# Patient Record
Sex: Female | Born: 1935 | Race: Asian | Hispanic: No | Smoking: Never smoker
Health system: Southern US, Community
[De-identification: ages and names within clinical notes are randomized; demographics above are authoritative.]

## PROBLEM LIST (undated history)

## (undated) DIAGNOSIS — E7800 Pure hypercholesterolemia, unspecified: Secondary | ICD-10-CM

## (undated) DIAGNOSIS — I639 Cerebral infarction, unspecified: Secondary | ICD-10-CM

## (undated) DIAGNOSIS — I10 Essential (primary) hypertension: Secondary | ICD-10-CM

## (undated) HISTORY — PX: NO PAST SURGERIES: SHX2092

## (undated) HISTORY — DX: Pure hypercholesterolemia, unspecified: E78.00

## (undated) HISTORY — DX: Essential (primary) hypertension: I10

## (undated) HISTORY — DX: Cerebral infarction, unspecified: I63.9

---

## 1997-12-01 ENCOUNTER — Encounter (INDEPENDENT_AMBULATORY_CARE_PROVIDER_SITE_OTHER): Payer: Self-pay | Admitting: *Deleted

## 1997-12-01 LAB — CONVERTED CEMR LAB

## 1998-09-11 ENCOUNTER — Encounter: Admission: RE | Admit: 1998-09-11 | Discharge: 1998-09-11 | Payer: Self-pay | Admitting: Family Medicine

## 2001-02-19 ENCOUNTER — Encounter: Admission: RE | Admit: 2001-02-19 | Discharge: 2001-02-19 | Payer: Self-pay | Admitting: Family Medicine

## 2002-10-06 ENCOUNTER — Emergency Department (HOSPITAL_COMMUNITY): Admission: EM | Admit: 2002-10-06 | Discharge: 2002-10-06 | Payer: Self-pay | Admitting: Emergency Medicine

## 2004-01-03 ENCOUNTER — Inpatient Hospital Stay (HOSPITAL_COMMUNITY): Admission: EM | Admit: 2004-01-03 | Discharge: 2004-01-07 | Payer: Self-pay | Admitting: Emergency Medicine

## 2004-03-05 ENCOUNTER — Emergency Department (HOSPITAL_COMMUNITY): Admission: EM | Admit: 2004-03-05 | Discharge: 2004-03-05 | Payer: Self-pay | Admitting: Emergency Medicine

## 2005-07-29 ENCOUNTER — Emergency Department (HOSPITAL_COMMUNITY): Admission: EM | Admit: 2005-07-29 | Discharge: 2005-07-29 | Payer: Self-pay | Admitting: *Deleted

## 2007-01-29 ENCOUNTER — Encounter (INDEPENDENT_AMBULATORY_CARE_PROVIDER_SITE_OTHER): Payer: Self-pay | Admitting: *Deleted

## 2012-10-29 ENCOUNTER — Ambulatory Visit (INDEPENDENT_AMBULATORY_CARE_PROVIDER_SITE_OTHER): Payer: Medicaid Other | Admitting: Family Medicine

## 2012-10-29 ENCOUNTER — Ambulatory Visit: Payer: Medicaid Other

## 2012-10-29 VITALS — BP 124/76 | HR 81 | Temp 98.0°F | Resp 18 | Ht <= 58 in | Wt 143.2 lb

## 2012-10-29 DIAGNOSIS — R42 Dizziness and giddiness: Secondary | ICD-10-CM

## 2012-10-29 DIAGNOSIS — R0789 Other chest pain: Secondary | ICD-10-CM

## 2012-10-29 DIAGNOSIS — R0602 Shortness of breath: Secondary | ICD-10-CM

## 2012-10-29 DIAGNOSIS — R059 Cough, unspecified: Secondary | ICD-10-CM

## 2012-10-29 DIAGNOSIS — R05 Cough: Secondary | ICD-10-CM | POA: Diagnosis not present

## 2012-10-29 DIAGNOSIS — R079 Chest pain, unspecified: Secondary | ICD-10-CM

## 2012-10-29 LAB — COMPREHENSIVE METABOLIC PANEL
ALT: 15 U/L (ref 0–35)
BUN: 11 mg/dL (ref 6–23)
CO2: 31 mEq/L (ref 19–32)
Calcium: 9.5 mg/dL (ref 8.4–10.5)
Chloride: 103 mEq/L (ref 96–112)
Creat: 0.91 mg/dL (ref 0.50–1.10)
Glucose, Bld: 91 mg/dL (ref 70–99)

## 2012-10-29 LAB — POCT CBC
HCT, POC: 46.8 % (ref 37.7–47.9)
Hemoglobin: 14.3 g/dL (ref 12.2–16.2)
Lymph, poc: 2.5 (ref 0.6–3.4)
MCH, POC: 25.4 pg — AB (ref 27–31.2)
MCHC: 30.6 g/dL — AB (ref 31.8–35.4)
MCV: 83.3 fL (ref 80–97)
RBC: 5.62 M/uL — AB (ref 4.04–5.48)
WBC: 5.9 10*3/uL (ref 4.6–10.2)

## 2012-10-29 MED ORDER — BENZONATATE 100 MG PO CAPS: ORAL_CAPSULE | ORAL | Status: DC

## 2012-10-29 MED ORDER — AZITHROMYCIN 250 MG PO TABS: ORAL_TABLET | ORAL | Status: DC

## 2012-10-29 NOTE — Progress Notes (Addendum)
Subjective: 78 year old lady who is from [place], has lived in the [place] for a long time, but does not speak [place]. She still is quite active, doing her housework and caring for finances. Her son lives with her. Her daughter came in today and helped interpret. Yesterday the patient was having some substernal heaviness sensation, feeling like she had told getting a good breath. She has not been sick otherwise. She has had these kind of symptoms once in the past and someone gave her a parachute dissolved under the tongue. She is not on regular medications. She is comfortable today. She did have some headache and maybe dizziness.Has had some cough Objective: Elderly lady in no acute distress. Throat clear. TMs normal. Neck supple without significant nodes. Chest clear. Heart regular without murmurs. Abdomen is soft without masses tenderness assessment: Atypical chest pain Shortness of breath Headache and maybe dizziness  Plan: EKG, chest x-ray, and labs  [facility] reading (PRIMARY) by  Dr. Alwyn Ren Normal cxr Borderline heart size .

## 2012-10-29 NOTE — Patient Instructions (Signed)
Take the medications.  If more chest pain or shortness of breath go to the emergency room or call 911  Return if not improving.

## 2013-12-07 ENCOUNTER — Ambulatory Visit (INDEPENDENT_AMBULATORY_CARE_PROVIDER_SITE_OTHER): Payer: Medicare Other | Admitting: Family Medicine

## 2013-12-07 VITALS — BP 140/80 | HR 71 | Temp 98.1°F | Resp 18 | Ht <= 58 in | Wt 142.0 lb

## 2013-12-07 DIAGNOSIS — R51 Headache: Secondary | ICD-10-CM

## 2013-12-07 DIAGNOSIS — R059 Cough, unspecified: Secondary | ICD-10-CM

## 2013-12-07 DIAGNOSIS — R05 Cough: Secondary | ICD-10-CM

## 2013-12-07 MED ORDER — AZITHROMYCIN 250 MG PO TABS: ORAL_TABLET | ORAL | Status: DC

## 2013-12-07 MED ORDER — HYDROCODONE-HOMATROPINE 5-1.5 MG/5ML PO SYRP: 5.0000 mL | ORAL_SOLUTION | Freq: Three times a day (TID) | ORAL | Status: DC | PRN

## 2013-12-07 NOTE — Progress Notes (Signed)
Subjective:  This chart was scribed for Ann Sidle, MD by Carl Best, Medical Scribe. This patient was seen in Room 4 and the patient's care was started at 6:07 PM.   Patient ID: Ann Coleman, female    DOB: [birth year], 80 y.o.   MRN: [id number]  HPI HPI Comments: [name] is a 80 y.o. female who presents to the [facility] complaining of constant headache and dizziness that started last night.  The patient's daughter denies fever as an associated symptom.  She states that the patient lists SOB and cough as associated symptoms.  The patient's daughter states that the patient had asthmatic symptoms two years ago.  She states that the patient has not been bothered by Asthma since her last visit to [facility]   Past Medical History  Diagnosis Date   Hypertension    History reviewed. No pertinent past surgical history. History reviewed. No pertinent family history. History   Social History   Marital Status: Divorced    Spouse Name: N/A    Number of Children: N/A   Years of Education: N/A   Occupational History   Not on file.   Social History Main Topics   Smoking status: Never Smoker    Smokeless tobacco: Not on file   Alcohol Use: No   Drug Use: No   Sexual Activity: No   Other Topics Concern   Not on file   Social History Narrative   No narrative on file   No Known Allergies   Review of Systems  Constitutional: Negative for fever.  Respiratory: Positive for cough and shortness of breath.   Neurological: Positive for dizziness and headaches.  All other systems reviewed and are negative.      Objective:  Physical Exam  Nursing note and vitals reviewed. Constitutional: She is oriented to person, place, and time. She appears well-developed and well-nourished.  HENT:  Head: Normocephalic and atraumatic.  Eyes: EOM are normal. Pupils are equal, round, and reactive to light.  Neck: Normal range of motion and phonation normal.    Cardiovascular: Normal rate, regular rhythm, normal heart sounds and intact distal pulses.   No murmur heard. Pulmonary/Chest: Effort normal and breath sounds normal. No respiratory distress. She has no wheezes. She has no rales.  Abdominal: Soft.  Musculoskeletal: Normal range of motion.  Neurological: She is alert and oriented to person, place, and time. No cranial nerve deficit. She exhibits normal muscle tone. Coordination normal.  Skin: Skin is warm and dry.  Psychiatric: She has a normal mood and affect. Her behavior is normal. Judgment and thought content normal.   Few ronchi bilaterally edentulous   BP 140/80   Pulse 71   Temp(Src) 98.1 F (36.7 C) (Oral)   Resp 18   Ht 4\' 10"  (1.473 m)   Wt 142 lb (64.411 kg)   BMI 29.69 kg/m2   SpO2 94% Assessment & Plan:    Meds ordered this encounter  Medications   azithromycin (ZITHROMAX) 250 MG tablet    Sig: Take 2 initiallly, then one daily for 4 days    Dispense:  6 tablet    Refill:  0   HYDROcodone-homatropine (HYCODAN) 5-1.5 MG/5ML syrup    Sig: Take 5 mLs by mouth every 8 (eight) hours as needed for cough.    Dispense:  120 mL    Refill:  0    1. Cough   2. Headache(784.0)    I personally performed the services described in this  documentation, which was scribed in my presence. The recorded information has been reviewed and is accurate.  Ann SidleKurt Lauenstein, MD

## 2013-12-16 ENCOUNTER — Encounter (HOSPITAL_COMMUNITY): Payer: Self-pay | Admitting: Emergency Medicine

## 2013-12-16 ENCOUNTER — Emergency Department (HOSPITAL_COMMUNITY)
Admission: EM | Admit: 2013-12-16 | Discharge: 2013-12-16 | Disposition: A | Discharge status: Home / Self Care | Payer: Medicare Other | Attending: Emergency Medicine | Admitting: Emergency Medicine

## 2013-12-16 ENCOUNTER — Emergency Department (HOSPITAL_COMMUNITY): Payer: Medicare Other

## 2013-12-16 DIAGNOSIS — IMO0002 Reserved for concepts with insufficient information to code with codable children: Secondary | ICD-10-CM | POA: Insufficient documentation

## 2013-12-16 DIAGNOSIS — J40 Bronchitis, not specified as acute or chronic: Secondary | ICD-10-CM

## 2013-12-16 DIAGNOSIS — R0789 Other chest pain: Secondary | ICD-10-CM | POA: Diagnosis not present

## 2013-12-16 DIAGNOSIS — I10 Essential (primary) hypertension: Secondary | ICD-10-CM | POA: Diagnosis not present

## 2013-12-16 DIAGNOSIS — Z792 Long term (current) use of antibiotics: Secondary | ICD-10-CM | POA: Insufficient documentation

## 2013-12-16 DIAGNOSIS — R0602 Shortness of breath: Secondary | ICD-10-CM | POA: Diagnosis not present

## 2013-12-16 DIAGNOSIS — J209 Acute bronchitis, unspecified: Secondary | ICD-10-CM | POA: Diagnosis not present

## 2013-12-16 LAB — POCT I-STAT, CHEM 8
BUN: 8 mg/dL (ref 6–23)
CREATININE: 0.9 mg/dL (ref 0.50–1.10)
Calcium, Ion: 1.06 mmol/L — ABNORMAL LOW (ref 1.13–1.30)
Chloride: 105 mEq/L (ref 96–112)
GLUCOSE: 96 mg/dL (ref 70–99)
HEMATOCRIT: 41 % (ref 36.0–46.0)
HEMOGLOBIN: 13.9 g/dL (ref 12.0–15.0)
POTASSIUM: 3.8 meq/L (ref 3.7–5.3)
SODIUM: 142 meq/L (ref 137–147)
TCO2: 28 mmol/L (ref 0–100)

## 2013-12-16 LAB — CBC WITH DIFFERENTIAL/PLATELET
BASOS PCT: 1 % (ref 0–1)
Basophils Absolute: 0 10*3/uL (ref 0.0–0.1)
EOS ABS: 0.1 10*3/uL (ref 0.0–0.7)
EOS PCT: 2 % (ref 0–5)
HCT: 38.5 % (ref 36.0–46.0)
Hemoglobin: 12.2 g/dL (ref 12.0–15.0)
Lymphocytes Relative: 52 % — ABNORMAL HIGH (ref 12–46)
Lymphs Abs: 2.8 10*3/uL (ref 0.7–4.0)
MCH: 25.6 pg — AB (ref 26.0–34.0)
MCHC: 31.7 g/dL (ref 30.0–36.0)
MCV: 80.9 fL (ref 78.0–100.0)
MONOS PCT: 8 % (ref 3–12)
Monocytes Absolute: 0.4 10*3/uL (ref 0.1–1.0)
NEUTROS PCT: 38 % — AB (ref 43–77)
Neutro Abs: 2.1 10*3/uL (ref 1.7–7.7)
PLATELETS: 198 10*3/uL (ref 150–400)
RBC: 4.76 MIL/uL (ref 3.87–5.11)
RDW: 13.8 % (ref 11.5–15.5)
WBC: 5.5 10*3/uL (ref 4.0–10.5)

## 2013-12-16 LAB — POCT I-STAT TROPONIN I: Troponin i, poc: 0 ng/mL (ref 0.00–0.08)

## 2013-12-16 MED ORDER — ALBUTEROL SULFATE HFA 108 (90 BASE) MCG/ACT IN AERS
1.0000 | INHALATION_SPRAY | Freq: Four times a day (QID) | RESPIRATORY_TRACT | Status: DC | PRN
  Administered 2013-12-16: 2 via RESPIRATORY_TRACT
  Filled 2013-12-16: qty 6.7

## 2013-12-16 MED ORDER — PREDNISONE 20 MG PO TABS: 40.0000 mg | ORAL_TABLET | Freq: Every day | ORAL | Status: DC

## 2013-12-16 MED ORDER — PREDNISONE 20 MG PO TABS
40.0000 mg | ORAL_TABLET | Freq: Once | ORAL | Status: AC
  Administered 2013-12-16: 40 mg via ORAL
  Filled 2013-12-16: qty 2

## 2013-12-16 MED ORDER — ALBUTEROL SULFATE (2.5 MG/3ML) 0.083% IN NEBU
5.0000 mg | INHALATION_SOLUTION | Freq: Once | RESPIRATORY_TRACT | Status: AC
  Administered 2013-12-16: 5 mg via RESPIRATORY_TRACT
  Filled 2013-12-16: qty 6

## 2013-12-16 NOTE — ED Notes (Signed)
Ann Coleman. [name] unable to draw remaining labs. Phlebotomy verbalized will attempt blood draw.

## 2013-12-16 NOTE — ED Notes (Signed)
Collect i stats via left AC unable to draw enough blood for complete set of labs. Leslee Home. [name] at bedside attempt to draw labs.

## 2013-12-16 NOTE — ED Notes (Addendum)
Pt complains of chest pain, sob and dizziness for over a week. Went to Urgent care a week ago for same, but not feeling better. Pt was prescribed nitro tabs, states is not helping pain

## 2013-12-16 NOTE — ED Notes (Signed)
EDP, Pickering at bedside.

## 2013-12-16 NOTE — ED Provider Notes (Signed)
CSN: [id number]     Arrival date & time 12/16/13  1248 History   First MD Initiated Contact with Patient 12/16/13 1411     Chief Complaint  Patient presents with  . Shortness of Breath  . URI   patient was translated for a family member. (Consider location/radiation/quality/duration/timing/severity/associated sxs/prior Treatment) Patient is a 80 y.o. female presenting with shortness of breath and URI. The history is provided by the patient and a relative.  Shortness of Breath Associated symptoms: chest pain and cough   Associated symptoms: no abdominal pain, no fever, no rash and no vomiting   URI Presenting symptoms: cough   Presenting symptoms: no fatigue and no fever    patient's had some shortness of breath with cough over the last week or 2. Is seen in urgent care given antibiotics. She's a history of pneumonia. Patient also has had some chest pain. He comes on with these episodes. Described as a tightness. In the past she has had a medicine that went in her mouth it is all for the episodes. No coronary artery disease seen on medical history, but there is somewhat of a language barrier. She's had a decreased exercise tolerance due to shortness of breath recently. No relief with the antibiotic she is on. His been seen in urgent care for the same.  Past Medical History  Diagnosis Date  . Hypertension    History reviewed. No pertinent past surgical history. No family history on file. History  Substance Use Topics  . Smoking status: Never Smoker   . Smokeless tobacco: Not on file  . Alcohol Use: No   OB History   Grav Para Term Preterm Abortions TAB SAB Ect Mult Living                 Review of Systems  Constitutional: Positive for chills. Negative for fever and fatigue.  Respiratory: Positive for cough and shortness of breath.   Cardiovascular: Positive for chest pain. Negative for leg swelling.  Gastrointestinal: Negative for nausea, vomiting, abdominal pain and diarrhea.   Genitourinary: Negative for flank pain.  Musculoskeletal: Negative for back pain.  Skin: Negative for rash.    Allergies  Review of patient's allergies indicates no known allergies.  Home Medications   Current Outpatient Rx  Name  Route  Sig  Dispense  Refill  . azithromycin (ZITHROMAX) 250 MG tablet      Take 2 initiallly, then one daily for 4 days   6 tablet   0   . HYDROcodone-homatropine (HYCODAN) 5-1.5 MG/5ML syrup   Oral   Take 5 mLs by mouth every 8 (eight) hours as needed for cough.   120 mL   0   . predniSONE (DELTASONE) 20 MG tablet   Oral   Take 2 tablets (40 mg total) by mouth daily.   12 tablet   0    BP 168/71  Pulse 80  Temp(Src) 98.3 F (36.8 C) (Oral)  Resp 18  SpO2 93% Physical Exam  Constitutional: She appears well-developed and well-nourished.  HENT:  Head: Normocephalic.  Eyes: Pupils are equal, round, and reactive to light.  Neck: Neck supple.  Cardiovascular: Normal rate and regular rhythm.   Pulmonary/Chest: Effort normal.  Mildly harsh breath sounds without frank wheezes  Abdominal: There is no tenderness.  Musculoskeletal: She exhibits no edema.  Neurological: She is alert.  Skin: Skin is warm.    ED Course  Procedures (including critical care time) Labs Review Labs Reviewed  CBC WITH DIFFERENTIAL -  Abnormal; Notable for the following:    MCH 25.6 (*)    Neutrophils Relative % 38 (*)    Lymphocytes Relative 52 (*)    All other components within normal limits  POCT I-STAT, CHEM 8 - Abnormal; Notable for the following:    Calcium, Ion 1.06 (*)    All other components within normal limits  POCT I-STAT TROPONIN I   Imaging Review No results found.  EKG Interpretation    Date/Time:  Friday December 16 2013 12:58:24 EST Ventricular Rate:  75 PR Interval:  166 QRS Duration: 62 QT Interval:  498 QTC Calculation: 556 R Axis:   52 Text Interpretation:  Sinus rhythm Borderline repolarization abnormality Prolonged QT  interval Baseline wander in lead(s) II III aVF V6 No significant change since last tracing Confirmed by Gresia Isidoro  MD, Allyna Pittsley ([id number]) on 12/16/2013 2:21:54 PM            MDM   1. Bronchitis    Patient with cough. Some shortness of breath. Workup reassuring. Not hypoxic. Will discharge    Juliet Rudeathan R. Rubin PayorPickering, MD 12/19/13 [phone]

## 2013-12-16 NOTE — ED Provider Notes (Signed)
Pt signed out to me.  Pt denies SOB currently, has episodes of dizziness.  No focal deficits, no arm drift.  No facial droop.  Inspiratory and exp wheezing more on left chest fields.  CXR is ok.  On RA, sats range from 91-97%.  With ambulation, maintained sats at 97%.  Will give an inhaler and Rx for steroids for home and encouraged close follow up.  Pt denies CP.  Troponin is normal.  Daughter will help ensure close follow up.    Gavin PoundMichael Y. Bryer Gottsch, MD 12/16/13 1728

## 2013-12-16 NOTE — ED Notes (Signed)
Respiratory at bedside providing inhaler use education.

## 2013-12-16 NOTE — ED Notes (Signed)
Per EDP, Ghim request respiratory paged to provide inhaler education.

## 2013-12-16 NOTE — Progress Notes (Signed)
   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

## 2013-12-16 NOTE — Discharge Instructions (Signed)

## 2013-12-16 NOTE — ED Notes (Signed)
Pt has hx of asthma with nonproductive cough. Pt denies pain at present time.

## 2013-12-16 NOTE — ED Notes (Signed)
Per family, pt was seen at urgent care on Tues cough med-family states cold symptoms-states SOB worse since then

## 2013-12-16 NOTE — ED Notes (Signed)
Pt. Walked from one end of the hall to the other end of the hall and pt. Oxygen level was 97%.

## 2017-03-04 ENCOUNTER — Encounter (HOSPITAL_COMMUNITY): Payer: Self-pay | Admitting: Emergency Medicine

## 2017-03-04 ENCOUNTER — Emergency Department (HOSPITAL_COMMUNITY): Payer: Medicare Other

## 2017-03-04 ENCOUNTER — Inpatient Hospital Stay (HOSPITAL_COMMUNITY)
Admission: EM | Admit: 2017-03-04 | Discharge: 2017-03-06 | DRG: 064 | Disposition: A | Discharge status: Home Health | Payer: Medicare Other | Attending: Internal Medicine | Admitting: Internal Medicine

## 2017-03-04 DIAGNOSIS — S0990XA Unspecified injury of head, initial encounter: Secondary | ICD-10-CM | POA: Diagnosis not present

## 2017-03-04 DIAGNOSIS — G8314 Monoplegia of lower limb affecting left nondominant side: Secondary | ICD-10-CM | POA: Diagnosis present

## 2017-03-04 DIAGNOSIS — I63511 Cerebral infarction due to unspecified occlusion or stenosis of right middle cerebral artery: Principal | ICD-10-CM | POA: Diagnosis present

## 2017-03-04 DIAGNOSIS — Z7982 Long term (current) use of aspirin: Secondary | ICD-10-CM | POA: Diagnosis not present

## 2017-03-04 DIAGNOSIS — R296 Repeated falls: Secondary | ICD-10-CM | POA: Diagnosis not present

## 2017-03-04 DIAGNOSIS — I639 Cerebral infarction, unspecified: Secondary | ICD-10-CM | POA: Diagnosis not present

## 2017-03-04 DIAGNOSIS — R531 Weakness: Secondary | ICD-10-CM | POA: Diagnosis not present

## 2017-03-04 DIAGNOSIS — I160 Hypertensive urgency: Secondary | ICD-10-CM | POA: Diagnosis present

## 2017-03-04 DIAGNOSIS — I10 Essential (primary) hypertension: Secondary | ICD-10-CM | POA: Diagnosis present

## 2017-03-04 DIAGNOSIS — S199XXA Unspecified injury of neck, initial encounter: Secondary | ICD-10-CM | POA: Diagnosis not present

## 2017-03-04 DIAGNOSIS — S8992XA Unspecified injury of left lower leg, initial encounter: Secondary | ICD-10-CM | POA: Diagnosis not present

## 2017-03-04 DIAGNOSIS — I739 Peripheral vascular disease, unspecified: Secondary | ICD-10-CM | POA: Diagnosis present

## 2017-03-04 DIAGNOSIS — G936 Cerebral edema: Secondary | ICD-10-CM | POA: Diagnosis present

## 2017-03-04 DIAGNOSIS — S299XXA Unspecified injury of thorax, initial encounter: Secondary | ICD-10-CM | POA: Diagnosis not present

## 2017-03-04 DIAGNOSIS — S8991XA Unspecified injury of right lower leg, initial encounter: Secondary | ICD-10-CM | POA: Diagnosis not present

## 2017-03-04 DIAGNOSIS — E785 Hyperlipidemia, unspecified: Secondary | ICD-10-CM | POA: Diagnosis present

## 2017-03-04 LAB — URINALYSIS, ROUTINE W REFLEX MICROSCOPIC
Bilirubin Urine: NEGATIVE
GLUCOSE, UA: NEGATIVE mg/dL
Hgb urine dipstick: NEGATIVE
KETONES UR: NEGATIVE mg/dL
LEUKOCYTES UA: NEGATIVE
NITRITE: NEGATIVE
PROTEIN: NEGATIVE mg/dL
Specific Gravity, Urine: 1.005 (ref 1.005–1.030)
pH: 7 (ref 5.0–8.0)

## 2017-03-04 LAB — COMPREHENSIVE METABOLIC PANEL
ALBUMIN: 3.5 g/dL (ref 3.5–5.0)
ALK PHOS: 79 U/L (ref 38–126)
ALT: 13 U/L — ABNORMAL LOW (ref 14–54)
ANION GAP: 7 (ref 5–15)
AST: 19 U/L (ref 15–41)
BILIRUBIN TOTAL: 0.8 mg/dL (ref 0.3–1.2)
BUN: 9 mg/dL (ref 6–20)
CALCIUM: 8.7 mg/dL — AB (ref 8.9–10.3)
CO2: 31 mmol/L (ref 22–32)
Chloride: 103 mmol/L (ref 101–111)
Creatinine, Ser: 0.86 mg/dL (ref 0.44–1.00)
GFR calc Af Amer: >60 mL/min (ref >60)
GFR, EST NON AFRICAN AMERICAN: 57 mL/min — AB (ref >60)
GLUCOSE: 93 mg/dL (ref 65–99)
Potassium: 3.2 mmol/L — ABNORMAL LOW (ref 3.5–5.1)
Sodium: 141 mmol/L (ref 135–145)
TOTAL PROTEIN: 7.7 g/dL (ref 6.5–8.1)

## 2017-03-04 LAB — CBC WITH DIFFERENTIAL/PLATELET
BASOS ABS: 0 10*3/uL (ref 0.0–0.1)
BASOS PCT: 1 %
EOS PCT: 2 %
Eosinophils Absolute: 0.1 10*3/uL (ref 0.0–0.7)
HCT: 39.4 % (ref 36.0–46.0)
Hemoglobin: 12.4 g/dL (ref 12.0–15.0)
Lymphocytes Relative: 41 %
Lymphs Abs: 2.4 10*3/uL (ref 0.7–4.0)
MCH: 25.4 pg — ABNORMAL LOW (ref 26.0–34.0)
MCHC: 31.5 g/dL (ref 30.0–36.0)
MCV: 80.7 fL (ref 78.0–100.0)
MONO ABS: 0.5 10*3/uL (ref 0.1–1.0)
Monocytes Relative: 9 %
Neutro Abs: 2.8 10*3/uL (ref 1.7–7.7)
Neutrophils Relative %: 47 %
PLATELETS: 240 10*3/uL (ref 150–400)
RBC: 4.88 MIL/uL (ref 3.87–5.11)
RDW: 13.7 % (ref 11.5–15.5)
WBC: 5.9 10*3/uL (ref 4.0–10.5)

## 2017-03-04 LAB — I-STAT TROPONIN, ED: TROPONIN I, POC: 0 ng/mL (ref 0.00–0.08)

## 2017-03-04 MED ORDER — ACETAMINOPHEN 160 MG/5ML PO SOLN: 650.0000 mg | ORAL | Status: DC | PRN

## 2017-03-04 MED ORDER — STROKE: EARLY STAGES OF RECOVERY BOOK
Freq: Once | Status: AC
  Administered 2017-03-05
  Filled 2017-03-04: qty 1

## 2017-03-04 MED ORDER — SENNOSIDES-DOCUSATE SODIUM 8.6-50 MG PO TABS: 1.0000 | ORAL_TABLET | Freq: Every evening | ORAL | Status: DC | PRN

## 2017-03-04 MED ORDER — ACETAMINOPHEN 325 MG PO TABS: 650.0000 mg | ORAL_TABLET | ORAL | Status: DC | PRN

## 2017-03-04 MED ORDER — SODIUM CHLORIDE 0.9 % IV SOLN
INTRAVENOUS | Status: AC
  Administered 2017-03-05 (×2): via INTRAVENOUS

## 2017-03-04 MED ORDER — ASPIRIN 300 MG RE SUPP: 300.0000 mg | Freq: Every day | RECTAL | Status: DC

## 2017-03-04 MED ORDER — SODIUM CHLORIDE 0.9 % IV BOLUS (SEPSIS)
500.0000 mL | Freq: Once | INTRAVENOUS | Status: AC
  Administered 2017-03-04: 500 mL via INTRAVENOUS

## 2017-03-04 MED ORDER — HYDRALAZINE HCL 20 MG/ML IJ SOLN: 10.0000 mg | INTRAMUSCULAR | Status: DC | PRN

## 2017-03-04 MED ORDER — ASPIRIN 325 MG PO TABS
325.0000 mg | ORAL_TABLET | Freq: Every day | ORAL | Status: DC
  Administered 2017-03-05 – 2017-03-06 (×2): 325 mg via ORAL
  Filled 2017-03-04 (×2): qty 1

## 2017-03-04 MED ORDER — ACETAMINOPHEN 650 MG RE SUPP: 650.0000 mg | RECTAL | Status: DC | PRN

## 2017-03-04 NOTE — ED Notes (Signed)
Patient transported to MRI 

## 2017-03-04 NOTE — Consult Note (Signed)
Referring Physician: Dr. Oleta Mouse    Chief Complaint: Left leg pain  HPI: Ann Coleman is an 83 y.o. female from [place] who presented to the [facility] ED with c/c of left leg pain for a couple of days. She fell in the bathroom on Wednesday evening due to left leg pain. She did not lose consciousness or strike her head. She speaks [place] and relative provides interpretation during exam and interview. Denies headache, chest pain or abdominal pain. No vision change, confusion or aphasia. Endorses SOB. Only neurological complaint is left leg weakness. No sensory loss. No history of stroke or MI. She does not take ASA at home. MRI at [facility] ED revealed a subacute ischemic infarction involving the posterior limb of the right internal capsule.   Past Medical History:  Diagnosis Date  . Hypertension     History reviewed. No pertinent surgical history.  No family history on file. Social History:  reports that she has never smoked. She has never used smokeless tobacco. She reports that she does not drink alcohol or use drugs.  Allergies: No Known Allergies  Home Medications:  acetaminophen (TYLENOL) 500 MG tablet Take 500 mg by mouth every 6 (six) hours as needed for moderate pain. Rise Patience, MD Not Ordered  azithromycin (ZITHROMAX) 250 MG tablet Take 2 initiallly, then one daily for 4 days Rise Patience, MD Not Ordered   Patient not taking: Reported on 03/04/2017    HYDROcodone-homatropine (HYCODAN) 5-1.5 MG/5ML syrup Take 5 mLs by mouth every 8 (eight) hours as needed for cough. Rise Patience, MD Not Ordered   Patient not taking: Reported on 03/04/2017    predniSONE (DELTASONE) 20 MG tablet Take 2 tablets (40 mg total) by mouth daily. Rise Patience, MD Not Ordered   Patient not taking: Reported on 03/04/2017     ROS: As per HPI.   Physical Examination: Blood pressure (!) 222/101, pulse 78, temperature 98.6 F (37 C), resp. rate 18, height _0  (1.499 m), weight 64.4 kg (142 lb), SpO2  92 %.  HEENT: [place]/AT Ext: No edema.  Skin: No rash to face or extremities.   Neurologic Examination: Mental Status: Alert and oriented to date, month, city and hospital, but not year. Speech fluent with intact comprehension of commands related to her by relative in [place]. Pleasant and cooperative.  Cranial Nerves: II:  Visual fields grossly normal to bedside confrontation, PERRL III,IV, VI: ptosis not present, EOMI without nystagmus V,VII: smile symmetric, facial temp sensation normal bilaterally VIII: hearing intact to questions and commands IX,X: No hoarseness or hypophonia XI: No asymmetry XII: midline tongue extension  Motor: RUE: 5/5 RLE: 5/5 LUE: 5/5 LLE: 5/5 proximally with subtle 4+/5 weakness of knee extension. Hesitancy with left foot dorsiflexion.  Normal tone throughout; no atrophy noted Sensory: Temp and light touch intact throughout. No extinction noted.  Deep Tendon Reflexes:  2+ bilateral brachioradialis, biceps and patellae. 2+ left achilles, 1+ right achilles. Toes downgoing bilaterally.   Cerebellar: No ataxia with FNF bilaterally.  Gait: Able to stand with own power. Ambulation slow without asymmetry.    Results for orders placed or performed during the hospital encounter of 03/04/17 (from the past 48 hour(s))  Urinalysis, Routine w reflex microscopic     Status: Abnormal   Collection Time: 03/04/17 12:30 PM  Result Value Ref Range   Color, Urine STRAW (A) YELLOW   APPearance CLEAR CLEAR   Specific Gravity, Urine 1.005 1.005 - 1.030   pH 7.0 5.0 - 8.0  Glucose, UA NEGATIVE NEGATIVE mg/dL   Hgb urine dipstick NEGATIVE NEGATIVE   Bilirubin Urine NEGATIVE NEGATIVE   Ketones, ur NEGATIVE NEGATIVE mg/dL   Protein, ur NEGATIVE NEGATIVE mg/dL   Nitrite NEGATIVE NEGATIVE   Leukocytes, UA NEGATIVE NEGATIVE  Comprehensive metabolic panel     Status: Abnormal   Collection Time: 03/04/17 12:52 PM  Result Value Ref Range   Sodium 141 135 - 145 mmol/L    Potassium 3.2 (L) 3.5 - 5.1 mmol/L   Chloride 103 101 - 111 mmol/L   CO2 31 22 - 32 mmol/L   Glucose, Bld 93 65 - 99 mg/dL   BUN 9 6 - 20 mg/dL   Creatinine, Ser 0.86 0.44 - 1.00 mg/dL   Calcium 8.7 (L) 8.9 - 10.3 mg/dL   Total Protein 7.7 6.5 - 8.1 g/dL   Albumin 3.5 3.5 - 5.0 g/dL   AST 19 15 - 41 U/L   ALT 13 (L) 14 - 54 U/L   Alkaline Phosphatase 79 38 - 126 U/L   Total Bilirubin 0.8 0.3 - 1.2 mg/dL   GFR calc non Af Amer 57 (L) >60 mL/min   GFR calc Af Amer >60 >60 mL/min    Comment: (NOTE) The eGFR has been calculated using the CKD EPI equation. This calculation has not been validated in all clinical situations. eGFR's persistently <60 mL/min signify possible Chronic Kidney Disease.    Anion gap 7 5 - 15  CBC with Differential     Status: Abnormal   Collection Time: 03/04/17 12:52 PM  Result Value Ref Range   WBC 5.9 4.0 - 10.5 K/uL   RBC 4.88 3.87 - 5.11 MIL/uL   Hemoglobin 12.4 12.0 - 15.0 g/dL   HCT 39.4 36.0 - 46.0 %   MCV 80.7 78.0 - 100.0 fL   MCH 25.4 (L) 26.0 - 34.0 pg   MCHC 31.5 30.0 - 36.0 g/dL   RDW 13.7 11.5 - 15.5 %   Platelets 240 150 - 400 K/uL   Neutrophils Relative % 47 %   Neutro Abs 2.8 1.7 - 7.7 K/uL   Lymphocytes Relative 41 %   Lymphs Abs 2.4 0.7 - 4.0 K/uL   Monocytes Relative 9 %   Monocytes Absolute 0.5 0.1 - 1.0 K/uL   Eosinophils Relative 2 %   Eosinophils Absolute 0.1 0.0 - 0.7 K/uL   Basophils Relative 1 %   Basophils Absolute 0.0 0.0 - 0.1 K/uL  I-stat troponin, ED     Status: None   Collection Time: 03/04/17  1:01 PM  Result Value Ref Range   Troponin i, poc 0.00 0.00 - 0.08 ng/mL   Comment 3            Comment: Due to the release kinetics of cTnI, a negative result within the first hours of the onset of symptoms does not rule out myocardial infarction with certainty. If myocardial infarction is still suspected, repeat the test at appropriate intervals.    Dg Chest 2 View  Result Date: 03/04/2017 CLINICAL DATA:   Palpitations.  Weakness and falls EXAM: CHEST  2 VIEW COMPARISON:  12/16/2013 FINDINGS: Chronic cardiomegaly and aortic tortuosity. Chronic widening of the upper mediastinum, usually from ectatic great vessels or thyroid enlargement, stable. There is no edema, consolidation, effusion, or pneumothorax. No acute osseous finding. IMPRESSION: 1. No acute finding when compared to prior. 2. Chronic cardiomegaly. Electronically Signed   By: Monte Fantasia M.D.   On: 03/04/2017 12:53   Ct Head Wo  Contrast  Result Date: 03/04/2017 CLINICAL DATA:  Patient fell in the bathroom. EXAM: CT HEAD WITHOUT CONTRAST CT CERVICAL SPINE WITHOUT CONTRAST TECHNIQUE: Multidetector CT imaging of the head and cervical spine was performed following the standard protocol without intravenous contrast. Multiplanar CT image reconstructions of the cervical spine were also generated. COMPARISON:  None. FINDINGS: CT HEAD FINDINGS Brain: There is no evidence for acute hemorrhage, hydrocephalus, mass lesion, or abnormal extra-axial fluid collection. No definite CT evidence for acute infarction. Diffuse loss of parenchymal volume is consistent with atrophy. Patchy low attenuation in the deep hemispheric and periventricular white matter is nonspecific, but likely reflects chronic microvascular ischemic demyelination. Lacunar infarct noted left basal ganglia. Vascular: No hyperdense vessel or unexpected calcification. Skull: No evidence for fracture. No worrisome lytic or sclerotic lesion. Sinuses/Orbits: The visualized paranasal sinuses and mastoid air cells are clear. Visualized portions of the globes and intraorbital fat are unremarkable. Other: None. CT CERVICAL SPINE FINDINGS Alignment: No subluxation. Mild straightening of normal cervical lordosis is evident. Facets are well aligned bilaterally. Skull base and vertebrae: No fracture. Soft tissues and spinal canal: 2.7 cm heterogeneous right thyroid nodule. 1.9 cm rim calcified left thyroid  nodule. No prevertebral soft tissue edema. Disc levels:  Relatively preserved throughout. Upper chest:  Biapical pleural-parenchymal scarring. Other: None. IMPRESSION: 1. No acute intracranial abnormality. 2. Atrophy with chronic small vessel white matter ischemic disease. 3. No cervical spine fracture. 4. Loss of cervical lordosis. This can be related to patient positioning, muscle spasm or soft tissue injury. 5. Bilateral thyroid nodules measuring up to 2.7 cm diameter. Thyroid ultrasound recommended to further evaluate. Electronically Signed   By: Misty Stanley M.D.   On: 03/04/2017 13:43   Ct Cervical Spine Wo Contrast  Result Date: 03/04/2017 CLINICAL DATA:  Patient fell in the bathroom. EXAM: CT HEAD WITHOUT CONTRAST CT CERVICAL SPINE WITHOUT CONTRAST TECHNIQUE: Multidetector CT imaging of the head and cervical spine was performed following the standard protocol without intravenous contrast. Multiplanar CT image reconstructions of the cervical spine were also generated. COMPARISON:  None. FINDINGS: CT HEAD FINDINGS Brain: There is no evidence for acute hemorrhage, hydrocephalus, mass lesion, or abnormal extra-axial fluid collection. No definite CT evidence for acute infarction. Diffuse loss of parenchymal volume is consistent with atrophy. Patchy low attenuation in the deep hemispheric and periventricular white matter is nonspecific, but likely reflects chronic microvascular ischemic demyelination. Lacunar infarct noted left basal ganglia. Vascular: No hyperdense vessel or unexpected calcification. Skull: No evidence for fracture. No worrisome lytic or sclerotic lesion. Sinuses/Orbits: The visualized paranasal sinuses and mastoid air cells are clear. Visualized portions of the globes and intraorbital fat are unremarkable. Other: None. CT CERVICAL SPINE FINDINGS Alignment: No subluxation. Mild straightening of normal cervical lordosis is evident. Facets are well aligned bilaterally. Skull base and vertebrae:  No fracture. Soft tissues and spinal canal: 2.7 cm heterogeneous right thyroid nodule. 1.9 cm rim calcified left thyroid nodule. No prevertebral soft tissue edema. Disc levels:  Relatively preserved throughout. Upper chest:  Biapical pleural-parenchymal scarring. Other: None. IMPRESSION: 1. No acute intracranial abnormality. 2. Atrophy with chronic small vessel white matter ischemic disease. 3. No cervical spine fracture. 4. Loss of cervical lordosis. This can be related to patient positioning, muscle spasm or soft tissue injury. 5. Bilateral thyroid nodules measuring up to 2.7 cm diameter. Thyroid ultrasound recommended to further evaluate. Electronically Signed   By: Misty Stanley M.D.   On: 03/04/2017 13:43   Mr Brain Wo Contrast  Result Date: 03/04/2017  CLINICAL DATA:  Left leg weakness.  Multiple recent falls. EXAM: MRI HEAD WITHOUT CONTRAST TECHNIQUE: Multiplanar, multiecho pulse sequences of the brain and surrounding structures were obtained without intravenous contrast. COMPARISON:  Head CT 03/04/2017 FINDINGS: Despite efforts by the technologist and patient, motion artifact is present on today's examination and could not be eliminated. The findings of this study are interpreted in the context of that limitation. Brain: There is high signal on diffusion-weighted imaging along the course of the posterior limb of the right internal capsule without diffusion restriction on the ADC map. There is an area of hyperintense T2 weighted signal surrounding this lesion extending into the superior aspect of the right thalamus. No acute intraparenchymal hematoma. There are multiple old basal ganglia lacunar infarcts. There is beginning confluent hyperintense T2-weighted signal within the periventricular white matter, most often seen in the setting of chronic microvascular ischemia. Vascular: Major intracranial arterial and venous sinus flow voids are preserved. No evidence of chronic microhemorrhage or amyloid  angiopathy. Skull and upper cervical spine: The visualized skull base, calvarium, upper cervical spine and extracranial soft tissues are normal. Sinuses/Orbits: No fluid levels or advanced mucosal thickening. No mastoid effusion. Normal orbits. IMPRESSION: 1. Likely subacute ischemia involving the posterior limb of the right internal capsule with edema extending into the superior aspect of the right thalamus. This is in keeping with reported left-sided weakness. 2. No hemorrhage or significant mass effect. 3. Multiple old lacunar infarcts of the basal ganglia and findings of chronic microvascular ischemia. Electronically Signed   By: Ulyses Jarred M.D.   On: 03/04/2017 19:19   Dg Knee Complete 4 Views Left  Result Date: 03/04/2017 CLINICAL DATA:  Chronic left knee pain, aggravated by a fall this morning. EXAM: LEFT KNEE - COMPLETE 4+ VIEW COMPARISON:  None. FINDINGS: The patient has severe tricompartmental osteoarthritis. There is no fracture or dislocation or appreciable joint effusion. Loose bodies in the joint. IMPRESSION: No acute abnormalities.  Severe tricompartmental osteoarthritis. Electronically Signed   By: Lorriane Shire M.D.   On: 03/04/2017 11:54   Dg Knee Complete 4 Views Right  Result Date: 03/04/2017 CLINICAL DATA:  Left knee pain and weakness, fall, bilateral knee pain EXAM: RIGHT KNEE - COMPLETE 4+ VIEW COMPARISON:  None. FINDINGS: Four views of the right knee submitted. There is diffuse narrowing of joint space. Mild spurring medial and lateral tibial plateau. There is spurring of lateral femoral condyle. Significant narrowing of patellofemoral joint space. Spurring of patella. Small joint effusion. Diffuse osteopenia. No acute fracture or subluxation. IMPRESSION: No acute fracture or subluxation. Osteoarthritic changes as described above. Diffuse osteopenia. Small joint effusion. Electronically Signed   By: Lahoma Crocker M.D.   On: 03/04/2017 11:54    Assessment: 83 y.o. female with 2 day  history of left leg pain followed by a fall attributed to pain. 1. MRI revealed subacute infarction involving the posterior limb of the right internal capsule with edema extending into the superior aspect of the right thalamus.  2. Also seen on MRI are multiple old lacunar infarcts of the basal ganglia as well as chronic microvascular ischemic changes. 3. MRA reveals patent circle of Willis with no large vessel occlusion. There is diminished signal within the left internal carotid artery from distal cavernous to terminal segment indicating probable underlying moderate to severe stenosis. Also noted is asymmetric reduced signal in left MCA distribution likely due to upstream ICA stenosis.  4. Neurological examination reveals subtle LLE weakness distally, otherwise nonfocal.  5. EKG shows no  atrial fibrillation.  6. Stroke Risk Factors - HTN.   Plan: 1. HgbA1c, fasting lipid panel 2. Carotid ultrasound 3. TTE.  4. PT consult, OT consult, Speech consult 5. Started on ASA 6. BP management. Out of permissive HTN time window.  7. Telemetry monitoring 8. Frequent neuro checks 9. Despite intracranial atherosclerotic findings on MRA, given advanced age, risks of statin therapy most likely outweigh potential benefits  _0  signed: Dr. Kerney Elbe 03/04/2017, 9:18 PM

## 2017-03-04 NOTE — ED Provider Notes (Signed)
[facility]-EMERGENCY DEPT Provider Note   CSN: [id number] Arrival date & time: 03/04/17  1045     History   Chief Complaint Chief Complaint  Patient presents with  . Fall    HPI Ann Coleman is a 83 y.o. female.  HPI\ Patient brought in by daughter for multiple falls, bilateral knee pain, left leg "heaviness."  Pt began falling last week, fell 3-4 times.  Daughter moved her to her house where she fell this morning.  Pt has a walker but fell over anyway.  She did hit the back of her head.  Pt c/o palpitations since the fall this morning.  Daughter notes patient has complained that her left leg is "heavy."  Denies fevers, recent illness, CP, SOB, cough, abdominal pain, N/V/D, urinary symptoms.    History primarily provided by daughter.  Pt does not speak [place] English.  She is from [place], here since 1983.    Pt has no PCP.  Takes no chronic medications.    Past Medical History:  Diagnosis Date  . Hypertension     Patient Active Problem List   Diagnosis Date Noted  . Acute right MCA stroke (HCC) 03/04/2017  . Hypertensive urgency 03/04/2017  . Stroke (cerebrum) (HCC) 03/04/2017    History reviewed. No pertinent surgical history.  OB History    No data available       Home Medications    Prior to Admission medications   Medication Sig Start Date End Date Taking? Authorizing Provider  acetaminophen (TYLENOL) 500 MG tablet Take 500 mg by mouth every 6 (six) hours as needed for moderate pain.   Yes Historical Provider, MD  azithromycin (ZITHROMAX) 250 MG tablet Take 2 initiallly, then one daily for 4 days Patient not taking: Reported on 03/04/2017 12/07/13   [name], MD  HYDROcodone-homatropine [facility]) 5-1.5 MG/5ML syrup Take 5 mLs by mouth every 8 (eight) hours as needed for cough. Patient not taking: Reported on 03/04/2017 12/07/13   [name], MD  predniSONE (DELTASONE) 20 MG tablet Take 2 tablets (40 mg total) by mouth daily. Patient not taking: Reported on 03/04/2017  12/16/13   Quita Skye, MD    Family History Family History  Problem Relation Age of Onset  . Stroke Neg Hx   . Diabetes Mellitus II Neg Hx   . Hypertension Neg Hx     Social History Social History  Substance Use Topics  . Smoking status: Never Smoker  . Smokeless tobacco: Never Used  . Alcohol use No     Allergies   Patient has no known allergies.   Review of Systems Review of Systems  All other systems reviewed and are negative.    Physical Exam Updated Vital Signs BP (!) 222/101 (BP Location: Right Arm)   Pulse 78   Temp 98.6 F (37 C)   Resp 18   Ht  (1.499 m)   Wt 64.4 kg   SpO2 92%   BMI 28.68 kg/m   Physical Exam  Constitutional: She appears well-developed and well-nourished. No distress.  HENT:  Head: Normocephalic and atraumatic.  Neck: Neck supple.  Cardiovascular: Normal rate, regular rhythm and intact distal pulses.   Pulmonary/Chest: Effort normal and breath sounds normal. No respiratory distress. She has no wheezes. She has no rales.  Abdominal: Soft. She exhibits no distension. There is no tenderness. There is no rebound and no guarding.  Musculoskeletal:  No focal tenderness on exam.  No edema or skin changes.    Neurological: She is  alert.  Moves all extremities equally.   Lower extremities:  No focal weakness noted, sensation intact, distal pulses intact.   Exam somewhat limited due to translation/pt comprehension? Pt unable to stand without assistance, daughters help pt move legs onto bed when resettling from wheelchair.   Skin: She is not diaphoretic.  Nursing note and vitals reviewed.    ED Treatments / Results  Labs (all labs ordered are listed, but only abnormal results are displayed) Labs Reviewed  COMPREHENSIVE METABOLIC PANEL - Abnormal; Notable for the following:       Result Value   Potassium 3.2 (*)    Calcium 8.7 (*)    ALT 13 (*)    GFR calc non Af Amer 57 (*)    All other components within normal limits    CBC WITH DIFFERENTIAL/PLATELET - Abnormal; Notable for the following:    MCH 25.4 (*)    All other components within normal limits  URINALYSIS, ROUTINE W REFLEX MICROSCOPIC - Abnormal; Notable for the following:    Color, Urine STRAW (*)    All other components within normal limits  URINE CULTURE  I-STAT TROPOININ, ED    EKG  EKG Interpretation  Date/Time:  Wednesday March 04 2017 20:09:54 EDT Ventricular Rate:  74 PR Interval:    QRS Duration: 63 QT Interval:  526 QTC Calculation: 584 R Axis:   76 Text Interpretation:  Sinus rhythm Abnormal R-wave progression, early transition Left ventricular hypertrophy Borderline abnrm T, anterolateral leads Prolonged QT interval Confirmed by Deretha Emory  MD, SCOTT [phone]) on 03/04/2017 8:33:16 PM       Radiology Dg Chest 2 View  Result Date: 03/04/2017 CLINICAL DATA:  Palpitations.  Weakness and falls EXAM: CHEST  2 VIEW COMPARISON:  12/16/2013 FINDINGS: Chronic cardiomegaly and aortic tortuosity. Chronic widening of the upper mediastinum, usually from ectatic great vessels or thyroid enlargement, stable. There is no edema, consolidation, effusion, or pneumothorax. No acute osseous finding. IMPRESSION: 1. No acute finding when compared to prior. 2. Chronic cardiomegaly. Electronically Signed   By: Marnee Spring M.D.   On: 03/04/2017 12:53   Ct Head Wo Contrast  Result Date: 03/04/2017 CLINICAL DATA:  Patient fell in the bathroom. EXAM: CT HEAD WITHOUT CONTRAST CT CERVICAL SPINE WITHOUT CONTRAST TECHNIQUE: Multidetector CT imaging of the head and cervical spine was performed following the standard protocol without intravenous contrast. Multiplanar CT image reconstructions of the cervical spine were also generated. COMPARISON:  None. FINDINGS: CT HEAD FINDINGS Brain: There is no evidence for acute hemorrhage, hydrocephalus, mass lesion, or abnormal extra-axial fluid collection. No definite CT evidence for acute infarction. Diffuse loss of parenchymal  volume is consistent with atrophy. Patchy low attenuation in the deep hemispheric and periventricular white matter is nonspecific, but likely reflects chronic microvascular ischemic demyelination. Lacunar infarct noted left basal ganglia. Vascular: No hyperdense vessel or unexpected calcification. Skull: No evidence for fracture. No worrisome lytic or sclerotic lesion. Sinuses/Orbits: The visualized paranasal sinuses and mastoid air cells are clear. Visualized portions of the globes and intraorbital fat are unremarkable. Other: None. CT CERVICAL SPINE FINDINGS Alignment: No subluxation. Mild straightening of normal cervical lordosis is evident. Facets are well aligned bilaterally. Skull base and vertebrae: No fracture. Soft tissues and spinal canal: 2.7 cm heterogeneous right thyroid nodule. 1.9 cm rim calcified left thyroid nodule. No prevertebral soft tissue edema. Disc levels:  Relatively preserved throughout. Upper chest:  Biapical pleural-parenchymal scarring. Other: None. IMPRESSION: 1. No acute intracranial abnormality. 2. Atrophy with chronic small vessel white  matter ischemic disease. 3. No cervical spine fracture. 4. Loss of cervical lordosis. This can be related to patient positioning, muscle spasm or soft tissue injury. 5. Bilateral thyroid nodules measuring up to 2.7 cm diameter. Thyroid ultrasound recommended to further evaluate. Electronically Signed   By: Kennith Center M.D.   On: 03/04/2017 13:43   Ct Cervical Spine Wo Contrast  Result Date: 03/04/2017 CLINICAL DATA:  Patient fell in the bathroom. EXAM: CT HEAD WITHOUT CONTRAST CT CERVICAL SPINE WITHOUT CONTRAST TECHNIQUE: Multidetector CT imaging of the head and cervical spine was performed following the standard protocol without intravenous contrast. Multiplanar CT image reconstructions of the cervical spine were also generated. COMPARISON:  None. FINDINGS: CT HEAD FINDINGS Brain: There is no evidence for acute hemorrhage, hydrocephalus, mass  lesion, or abnormal extra-axial fluid collection. No definite CT evidence for acute infarction. Diffuse loss of parenchymal volume is consistent with atrophy. Patchy low attenuation in the deep hemispheric and periventricular white matter is nonspecific, but likely reflects chronic microvascular ischemic demyelination. Lacunar infarct noted left basal ganglia. Vascular: No hyperdense vessel or unexpected calcification. Skull: No evidence for fracture. No worrisome lytic or sclerotic lesion. Sinuses/Orbits: The visualized paranasal sinuses and mastoid air cells are clear. Visualized portions of the globes and intraorbital fat are unremarkable. Other: None. CT CERVICAL SPINE FINDINGS Alignment: No subluxation. Mild straightening of normal cervical lordosis is evident. Facets are well aligned bilaterally. Skull base and vertebrae: No fracture. Soft tissues and spinal canal: 2.7 cm heterogeneous right thyroid nodule. 1.9 cm rim calcified left thyroid nodule. No prevertebral soft tissue edema. Disc levels:  Relatively preserved throughout. Upper chest:  Biapical pleural-parenchymal scarring. Other: None. IMPRESSION: 1. No acute intracranial abnormality. 2. Atrophy with chronic small vessel white matter ischemic disease. 3. No cervical spine fracture. 4. Loss of cervical lordosis. This can be related to patient positioning, muscle spasm or soft tissue injury. 5. Bilateral thyroid nodules measuring up to 2.7 cm diameter. Thyroid ultrasound recommended to further evaluate. Electronically Signed   By: Kennith Center M.D.   On: 03/04/2017 13:43   Mr Brain Wo Contrast  Result Date: 03/04/2017 CLINICAL DATA:  Left leg weakness.  Multiple recent falls. EXAM: MRI HEAD WITHOUT CONTRAST TECHNIQUE: Multiplanar, multiecho pulse sequences of the brain and surrounding structures were obtained without intravenous contrast. COMPARISON:  Head CT 03/04/2017 FINDINGS: Despite efforts by the technologist and patient, motion artifact is  present on today's examination and could not be eliminated. The findings of this study are interpreted in the context of that limitation. Brain: There is high signal on diffusion-weighted imaging along the course of the posterior limb of the right internal capsule without diffusion restriction on the ADC map. There is an area of hyperintense T2 weighted signal surrounding this lesion extending into the superior aspect of the right thalamus. No acute intraparenchymal hematoma. There are multiple old basal ganglia lacunar infarcts. There is beginning confluent hyperintense T2-weighted signal within the periventricular white matter, most often seen in the setting of chronic microvascular ischemia. Vascular: Major intracranial arterial and venous sinus flow voids are preserved. No evidence of chronic microhemorrhage or amyloid angiopathy. Skull and upper cervical spine: The visualized skull base, calvarium, upper cervical spine and extracranial soft tissues are normal. Sinuses/Orbits: No fluid levels or advanced mucosal thickening. No mastoid effusion. Normal orbits. IMPRESSION: 1. Likely subacute ischemia involving the posterior limb of the right internal capsule with edema extending into the superior aspect of the right thalamus. This is in keeping with reported left-sided weakness.  2. No hemorrhage or significant mass effect. 3. Multiple old lacunar infarcts of the basal ganglia and findings of chronic microvascular ischemia. Electronically Signed   By: Deatra Robinson M.D.   On: 03/04/2017 19:19   Dg Knee Complete 4 Views Left  Result Date: 03/04/2017 CLINICAL DATA:  Chronic left knee pain, aggravated by a fall this morning. EXAM: LEFT KNEE - COMPLETE 4+ VIEW COMPARISON:  None. FINDINGS: The patient has severe tricompartmental osteoarthritis. There is no fracture or dislocation or appreciable joint effusion. Loose bodies in the joint. IMPRESSION: No acute abnormalities.  Severe tricompartmental osteoarthritis.  Electronically Signed   By: Francene Boyers M.D.   On: 03/04/2017 11:54   Dg Knee Complete 4 Views Right  Result Date: 03/04/2017 CLINICAL DATA:  Left knee pain and weakness, fall, bilateral knee pain EXAM: RIGHT KNEE - COMPLETE 4+ VIEW COMPARISON:  None. FINDINGS: Four views of the right knee submitted. There is diffuse narrowing of joint space. Mild spurring medial and lateral tibial plateau. There is spurring of lateral femoral condyle. Significant narrowing of patellofemoral joint space. Spurring of patella. Small joint effusion. Diffuse osteopenia. No acute fracture or subluxation. IMPRESSION: No acute fracture or subluxation. Osteoarthritic changes as described above. Diffuse osteopenia. Small joint effusion. Electronically Signed   By: Natasha Mead M.D.   On: 03/04/2017 11:54    Procedures Procedures (including critical care time)  Medications Ordered in ED Medications  sodium chloride 0.9 % bolus 500 mL (0 mLs Intravenous Stopped 03/04/17 1636)     Initial Impression / Assessment and Plan / ED Course  I have reviewed the triage vital signs and the nursing notes.  Pertinent labs & imaging results that were available during my care of the patient were reviewed by me and considered in my medical decision making (see chart for details).  Clinical Course as of Mar 04 2141  Wed Mar 04, 2017  2014 Discussed pt with Dr Toniann Fail, [facility], who accepts patient for admission.  Requests call to neurology to ask [facility].    [EW]  2101 Repaged neurology  [EW]  2112 I spoke with Dr Otelia Limes who requests transfer to [facility] for admission.    [EW]    Clinical Course User Index [EW] Trixie Dredge, PA-C    Afebrile nontoxic elderly patient p/w multiple falls, generalized weakness, "heavy" left leg.  Labs, UA unremarkable.  CT, xrays negative.  MRI brain demonstrates subacute ischemic stroke.  Pt hypertensive.  Discussed hypertension with admission MD Dr Teddy Spike, will defer treatment to  admitting team.  I also spoke with Dr Otelia Limes, neurohospitalist, who request pt be admitted at [facility] so she can be rounded on by stroke team.  He will consult tonight.  Dr Julian Reil to accept at [facility].    Final Clinical Impressions(s) / ED Diagnoses   Final diagnoses:  Cerebrovascular accident (CVA), unspecified mechanism (HCC)  Hypertension, unspecified type    New Prescriptions New Prescriptions   No medications on file     Trixie Dredge, PA-C 03/04/17 2144    Lavera Guise, MD 03/05/17 [phone]

## 2017-03-04 NOTE — H&P (Signed)
History and Physical    Rima Ginyard ZOX:[id number] DOB: 04-26-1924 DOA: 03/04/2017  PCP: No PCP Per Patient  Patient coming from: Home.  Patient speaks [name] language. Interpretation provided by patient's daughter.  Chief Complaint: Left lower extremity weakness.  HPI: Ann Coleman is a 83 y.o. female with no significant past medical history who has not been to a physician for many years was experiencing left lower extremity weakness over the last 3 days. Patient also has been having frequent falls. Denies hitting her head or losing consciousness. Has no difficulty in speaking swallowing or visual symptoms or any weakness of other extremities.   ED Course: X-rays of the knee shows small effusion. MRI of the brain shows right internal capsule subacute stroke. Neurologist on-call was consulted and patient will be transferred for further To [facility]. On my exam patient still has weakness of the left lower extremity 3 x 5 in strength. Able to move all extremities. Patient passed swallow. Patient's blood pressure is found to be markedly elevated.  Review of Systems: As per HPI, rest all negative.   Past Medical History:  Diagnosis Date  . Hypertension     History reviewed. No pertinent surgical history.   reports that she has never smoked. She has never used smokeless tobacco. She reports that she does not drink alcohol or use drugs.  No Known Allergies  Family History  Problem Relation Age of Onset  . Stroke Neg Hx   . Diabetes Mellitus II Neg Hx   . Hypertension Neg Hx     Prior to Admission medications   Medication Sig Start Date End Date Taking? Authorizing Provider  acetaminophen (TYLENOL) 500 MG tablet Take 500 mg by mouth every 6 (six) hours as needed for moderate pain.   Yes Historical Provider, MD  azithromycin (ZITHROMAX) 250 MG tablet Take 2 initiallly, then one daily for 4 days Patient not taking: Reported on 03/04/2017 12/07/13   [name], MD    HYDROcodone-homatropine [facility]) 5-1.5 MG/5ML syrup Take 5 mLs by mouth every 8 (eight) hours as needed for cough. Patient not taking: Reported on 03/04/2017 12/07/13   [name], MD  predniSONE (DELTASONE) 20 MG tablet Take 2 tablets (40 mg total) by mouth daily. Patient not taking: Reported on 03/04/2017 12/16/13   Quita Skye, MD    Physical Exam: Vitals:   03/04/17 1806 03/04/17 2013 03/04/17 2031 03/04/17 2109  BP: (!) 197/101 (!) 180/86  (!) 222/101  Pulse: 75 69  78  Resp: Temp:   98.6 F (37 C)   TempSrc:      SpO2: 94% 90%  92%  Weight:      Height:          Constitutional: Moderately built and nourished. Vitals:   03/04/17 1806 03/04/17 2013 03/04/17 2031 03/04/17 2109  BP: (!) 197/101 (!) 180/86  (!) 222/101  Pulse: 75 69  78  Resp: Temp:   98.6 F (37 C)   TempSrc:      SpO2: 94% 90%  92%  Weight:      Height:       Eyes: Anicteric no pallor. ENMT: No discharge from the ears eyes nose and mouth. Neck: No mass felt. No neck rigidity. Respiratory: No rhonchi or crepitations. Cardiovascular: S1-S2 no murmurs appreciated. Abdomen: Soft nontender bowel sounds present. No guarding or rigidity. Musculoskeletal: No edema. No joint effusion. Skin: No rash. Skin appears warm. Neurologic: Alert awake  oriented to time place and person. Has weakness of the left lower extremity 3 x 5 in strength. Rest of the extremities are 5 x 5 in strength. No facial asymmetry. Tongue is midline. Pupils are equal and reacting. Psychiatric: Appears normal.   Labs on Admission: I have personally reviewed following labs and imaging studies  CBC:  Recent Labs Lab 03/04/17 1252  WBC 5.9  NEUTROABS 2.8  HGB 12.4  HCT 39.4  MCV 80.7  PLT 240   Basic Metabolic Panel:  Recent Labs Lab 03/04/17 1252  NA 141  K 3.2*  CL 103  CO2 31  GLUCOSE 93  BUN 9  CREATININE 0.86  CALCIUM 8.7*   GFR: Estimated Creatinine Clearance: 34.8 mL/min (by C-G  formula based on SCr of 0.86 mg/dL). Liver Function Tests:  Recent Labs Lab 03/04/17 1252  AST 19  ALT 13*  ALKPHOS 79  BILITOT 0.8  PROT 7.7  ALBUMIN 3.5   No results for input(s): LIPASE, AMYLASE in the last 168 hours. No results for input(s): AMMONIA in the last 168 hours. Coagulation Profile: No results for input(s): INR, PROTIME in the last 168 hours. Cardiac Enzymes: No results for input(s): CKTOTAL, CKMB, CKMBINDEX, TROPONINI in the last 168 hours. BNP (last 3 results) No results for input(s): PROBNP in the last 8760 hours. HbA1C: No results for input(s): HGBA1C in the last 72 hours. CBG: No results for input(s): GLUCAP in the last 168 hours. Lipid Profile: No results for input(s): CHOL, HDL, LDLCALC, TRIG, CHOLHDL, LDLDIRECT in the last 72 hours. Thyroid Function Tests: No results for input(s): TSH, T4TOTAL, FREET4, T3FREE, THYROIDAB in the last 72 hours. Anemia Panel: No results for input(s): VITAMINB12, FOLATE, FERRITIN, TIBC, IRON, RETICCTPCT in the last 72 hours. Urine analysis:    Component Value Date/Time   COLORURINE STRAW (A) 03/04/2017 1230   APPEARANCEUR CLEAR 03/04/2017 1230   LABSPEC 1.005 03/04/2017 1230   PHURINE 7.0 03/04/2017 1230   GLUCOSEU NEGATIVE 03/04/2017 1230   HGBUR NEGATIVE 03/04/2017 1230   BILIRUBINUR NEGATIVE 03/04/2017 1230   KETONESUR NEGATIVE 03/04/2017 1230   PROTEINUR NEGATIVE 03/04/2017 1230   NITRITE NEGATIVE 03/04/2017 1230   LEUKOCYTESUR NEGATIVE 03/04/2017 1230   Sepsis Labs: (procalcitonin:4,lacticidven:4) )No results found for this or any previous visit (from the past 240 hour(s)).   Radiological Exams on Admission: Dg Chest 2 View  Result Date: 03/04/2017 CLINICAL DATA:  Palpitations.  Weakness and falls EXAM: CHEST  2 VIEW COMPARISON:  12/16/2013 FINDINGS: Chronic cardiomegaly and aortic tortuosity. Chronic widening of the upper mediastinum, usually from ectatic great vessels or thyroid enlargement,  stable. There is no edema, consolidation, effusion, or pneumothorax. No acute osseous finding. IMPRESSION: 1. No acute finding when compared to prior. 2. Chronic cardiomegaly. Electronically Signed   By: Marnee Spring M.D.   On: 03/04/2017 12:53   Ct Head Wo Contrast  Result Date: 03/04/2017 CLINICAL DATA:  Patient fell in the bathroom. EXAM: CT HEAD WITHOUT CONTRAST CT CERVICAL SPINE WITHOUT CONTRAST TECHNIQUE: Multidetector CT imaging of the head and cervical spine was performed following the standard protocol without intravenous contrast. Multiplanar CT image reconstructions of the cervical spine were also generated. COMPARISON:  None. FINDINGS: CT HEAD FINDINGS Brain: There is no evidence for acute hemorrhage, hydrocephalus, mass lesion, or abnormal extra-axial fluid collection. No definite CT evidence for acute infarction. Diffuse loss of parenchymal volume is consistent with atrophy. Patchy low attenuation in the deep hemispheric and periventricular white matter is nonspecific, but likely reflects chronic microvascular ischemic  demyelination. Lacunar infarct noted left basal ganglia. Vascular: No hyperdense vessel or unexpected calcification. Skull: No evidence for fracture. No worrisome lytic or sclerotic lesion. Sinuses/Orbits: The visualized paranasal sinuses and mastoid air cells are clear. Visualized portions of the globes and intraorbital fat are unremarkable. Other: None. CT CERVICAL SPINE FINDINGS Alignment: No subluxation. Mild straightening of normal cervical lordosis is evident. Facets are well aligned bilaterally. Skull base and vertebrae: No fracture. Soft tissues and spinal canal: 2.7 cm heterogeneous right thyroid nodule. 1.9 cm rim calcified left thyroid nodule. No prevertebral soft tissue edema. Disc levels:  Relatively preserved throughout. Upper chest:  Biapical pleural-parenchymal scarring. Other: None. IMPRESSION: 1. No acute intracranial abnormality. 2. Atrophy with chronic small  vessel white matter ischemic disease. 3. No cervical spine fracture. 4. Loss of cervical lordosis. This can be related to patient positioning, muscle spasm or soft tissue injury. 5. Bilateral thyroid nodules measuring up to 2.7 cm diameter. Thyroid ultrasound recommended to further evaluate. Electronically Signed   By: Kennith Center M.D.   On: 03/04/2017 13:43   Ct Cervical Spine Wo Contrast  Result Date: 03/04/2017 CLINICAL DATA:  Patient fell in the bathroom. EXAM: CT HEAD WITHOUT CONTRAST CT CERVICAL SPINE WITHOUT CONTRAST TECHNIQUE: Multidetector CT imaging of the head and cervical spine was performed following the standard protocol without intravenous contrast. Multiplanar CT image reconstructions of the cervical spine were also generated. COMPARISON:  None. FINDINGS: CT HEAD FINDINGS Brain: There is no evidence for acute hemorrhage, hydrocephalus, mass lesion, or abnormal extra-axial fluid collection. No definite CT evidence for acute infarction. Diffuse loss of parenchymal volume is consistent with atrophy. Patchy low attenuation in the deep hemispheric and periventricular white matter is nonspecific, but likely reflects chronic microvascular ischemic demyelination. Lacunar infarct noted left basal ganglia. Vascular: No hyperdense vessel or unexpected calcification. Skull: No evidence for fracture. No worrisome lytic or sclerotic lesion. Sinuses/Orbits: The visualized paranasal sinuses and mastoid air cells are clear. Visualized portions of the globes and intraorbital fat are unremarkable. Other: None. CT CERVICAL SPINE FINDINGS Alignment: No subluxation. Mild straightening of normal cervical lordosis is evident. Facets are well aligned bilaterally. Skull base and vertebrae: No fracture. Soft tissues and spinal canal: 2.7 cm heterogeneous right thyroid nodule. 1.9 cm rim calcified left thyroid nodule. No prevertebral soft tissue edema. Disc levels:  Relatively preserved throughout. Upper chest:  Biapical  pleural-parenchymal scarring. Other: None. IMPRESSION: 1. No acute intracranial abnormality. 2. Atrophy with chronic small vessel white matter ischemic disease. 3. No cervical spine fracture. 4. Loss of cervical lordosis. This can be related to patient positioning, muscle spasm or soft tissue injury. 5. Bilateral thyroid nodules measuring up to 2.7 cm diameter. Thyroid ultrasound recommended to further evaluate. Electronically Signed   By: Kennith Center M.D.   On: 03/04/2017 13:43   Mr Brain Wo Contrast  Result Date: 03/04/2017 CLINICAL DATA:  Left leg weakness.  Multiple recent falls. EXAM: MRI HEAD WITHOUT CONTRAST TECHNIQUE: Multiplanar, multiecho pulse sequences of the brain and surrounding structures were obtained without intravenous contrast. COMPARISON:  Head CT 03/04/2017 FINDINGS: Despite efforts by the technologist and patient, motion artifact is present on today's examination and could not be eliminated. The findings of this study are interpreted in the context of that limitation. Brain: There is high signal on diffusion-weighted imaging along the course of the posterior limb of the right internal capsule without diffusion restriction on the ADC map. There is an area of hyperintense T2 weighted signal surrounding this lesion extending into the  superior aspect of the right thalamus. No acute intraparenchymal hematoma. There are multiple old basal ganglia lacunar infarcts. There is beginning confluent hyperintense T2-weighted signal within the periventricular white matter, most often seen in the setting of chronic microvascular ischemia. Vascular: Major intracranial arterial and venous sinus flow voids are preserved. No evidence of chronic microhemorrhage or amyloid angiopathy. Skull and upper cervical spine: The visualized skull base, calvarium, upper cervical spine and extracranial soft tissues are normal. Sinuses/Orbits: No fluid levels or advanced mucosal thickening. No mastoid effusion. Normal  orbits. IMPRESSION: 1. Likely subacute ischemia involving the posterior limb of the right internal capsule with edema extending into the superior aspect of the right thalamus. This is in keeping with reported left-sided weakness. 2. No hemorrhage or significant mass effect. 3. Multiple old lacunar infarcts of the basal ganglia and findings of chronic microvascular ischemia. Electronically Signed   By: Deatra Robinson M.D.   On: 03/04/2017 19:19   Dg Knee Complete 4 Views Left  Result Date: 03/04/2017 CLINICAL DATA:  Chronic left knee pain, aggravated by a fall this morning. EXAM: LEFT KNEE - COMPLETE 4+ VIEW COMPARISON:  None. FINDINGS: The patient has severe tricompartmental osteoarthritis. There is no fracture or dislocation or appreciable joint effusion. Loose bodies in the joint. IMPRESSION: No acute abnormalities.  Severe tricompartmental osteoarthritis. Electronically Signed   By: Francene Boyers M.D.   On: 03/04/2017 11:54   Dg Knee Complete 4 Views Right  Result Date: 03/04/2017 CLINICAL DATA:  Left knee pain and weakness, fall, bilateral knee pain EXAM: RIGHT KNEE - COMPLETE 4+ VIEW COMPARISON:  None. FINDINGS: Four views of the right knee submitted. There is diffuse narrowing of joint space. Mild spurring medial and lateral tibial plateau. There is spurring of lateral femoral condyle. Significant narrowing of patellofemoral joint space. Spurring of patella. Small joint effusion. Diffuse osteopenia. No acute fracture or subluxation. IMPRESSION: No acute fracture or subluxation. Osteoarthritic changes as described above. Diffuse osteopenia. Small joint effusion. Electronically Signed   By: Natasha Mead M.D.   On: 03/04/2017 11:54    EKG: Independently reviewed. Normal sinus rhythm.  Assessment/Plan Active Problems:   Acute right MCA stroke (HCC)   Hypertensive urgency   Stroke (cerebrum) (HCC)    1. Acute to subacute right MCA stroke - patient has passed swallow. Neurologist on-call has been  consulted. MRI brain 2-D echo carotid Dopplers will be ordered. Check hemoglobin A1c and lipid panel. Patient is on aspirin. 2. Hypertensive urgency - we'll allow for permissive hypertension for now. Will keep patient on when necessary hydralazine for systolic blood pressure more than 220 and diastolic more than 120.  Patient will be transferred to [facility], hospital for further care. Dr. Julian Reil will be the accepting physician. Patient and family are agreeable to transfer.   DVT prophylaxis: SCDs for now until blood pressure gets corrected. Code Status: Full code.  Family Communication: Patient's daughter.  Disposition Plan: Home.  Consults called: Neurology.  Admission status: Observation.    Eduard Clos MD [facility] Pager [phone].  If 7PM-7AM, please contact night-coverage [email or web address] Password [email or web address]  03/04/2017, 9:40 PM

## 2017-03-04 NOTE — ED Notes (Signed)
[facility] at bedside 

## 2017-03-04 NOTE — ED Triage Notes (Addendum)
Patient reports left leg pain for a couple of days. Today, patient fell in bathroom due to the pain in left leg pain. Patient is complaining of bilateral knee pain. Denies hitting head, loss of consciousness.

## 2017-03-05 ENCOUNTER — Observation Stay (HOSPITAL_COMMUNITY): Payer: Medicare Other

## 2017-03-05 DIAGNOSIS — I63511 Cerebral infarction due to unspecified occlusion or stenosis of right middle cerebral artery: Secondary | ICD-10-CM | POA: Diagnosis not present

## 2017-03-05 DIAGNOSIS — I160 Hypertensive urgency: Secondary | ICD-10-CM | POA: Diagnosis not present

## 2017-03-05 DIAGNOSIS — R296 Repeated falls: Secondary | ICD-10-CM | POA: Diagnosis present

## 2017-03-05 DIAGNOSIS — Z7982 Long term (current) use of aspirin: Secondary | ICD-10-CM | POA: Diagnosis not present

## 2017-03-05 DIAGNOSIS — G8314 Monoplegia of lower limb affecting left nondominant side: Secondary | ICD-10-CM | POA: Diagnosis present

## 2017-03-05 DIAGNOSIS — I10 Essential (primary) hypertension: Secondary | ICD-10-CM | POA: Diagnosis present

## 2017-03-05 DIAGNOSIS — I639 Cerebral infarction, unspecified: Secondary | ICD-10-CM

## 2017-03-05 DIAGNOSIS — E785 Hyperlipidemia, unspecified: Secondary | ICD-10-CM | POA: Diagnosis present

## 2017-03-05 DIAGNOSIS — I6789 Other cerebrovascular disease: Secondary | ICD-10-CM | POA: Diagnosis not present

## 2017-03-05 DIAGNOSIS — R531 Weakness: Secondary | ICD-10-CM | POA: Diagnosis not present

## 2017-03-05 DIAGNOSIS — G936 Cerebral edema: Secondary | ICD-10-CM | POA: Diagnosis present

## 2017-03-05 DIAGNOSIS — I739 Peripheral vascular disease, unspecified: Secondary | ICD-10-CM | POA: Diagnosis present

## 2017-03-05 LAB — VAS US CAROTID
LCCADDIAS: -8 cm/s
LCCADSYS: -49 cm/s
LEFT ECA DIAS: -8 cm/s
LEFT VERTEBRAL DIAS: 14 cm/s
LICAPDIAS: 7 cm/s
LICAPSYS: 28 cm/s
Left CCA prox dias: 10 cm/s
Left CCA prox sys: 65 cm/s
Left ICA dist dias: -12 cm/s
Left ICA dist sys: -43 cm/s
RCCADSYS: -75 cm/s
RCCAPDIAS: 12 cm/s
RIGHT ECA DIAS: 23 cm/s
RIGHT VERTEBRAL DIAS: 14 cm/s
Right CCA prox sys: 64 cm/s

## 2017-03-05 LAB — COMPREHENSIVE METABOLIC PANEL
ALBUMIN: 3 g/dL — AB (ref 3.5–5.0)
ALT: 14 U/L (ref 14–54)
ANION GAP: 8 (ref 5–15)
AST: 23 U/L (ref 15–41)
Alkaline Phosphatase: 76 U/L (ref 38–126)
BUN: 9 mg/dL (ref 6–20)
CALCIUM: 8.6 mg/dL — AB (ref 8.9–10.3)
CHLORIDE: 104 mmol/L (ref 101–111)
CO2: 27 mmol/L (ref 22–32)
Creatinine, Ser: 0.8 mg/dL (ref 0.44–1.00)
GFR calc non Af Amer: >60 mL/min (ref >60)
Glucose, Bld: 84 mg/dL (ref 65–99)
POTASSIUM: 3.5 mmol/L (ref 3.5–5.1)
SODIUM: 139 mmol/L (ref 135–145)
Total Bilirubin: 0.9 mg/dL (ref 0.3–1.2)
Total Protein: 6.8 g/dL (ref 6.5–8.1)

## 2017-03-05 LAB — URINE CULTURE

## 2017-03-05 LAB — LIPID PANEL
CHOL/HDL RATIO: 3.9 ratio
CHOLESTEROL: 182 mg/dL (ref 0–200)
HDL: 47 mg/dL (ref >40)
LDL CALC: 110 mg/dL — AB (ref 0–99)
Triglycerides: 124 mg/dL (ref <150)
VLDL: 25 mg/dL (ref 0–40)

## 2017-03-05 LAB — CBC
HEMATOCRIT: 37.6 % (ref 36.0–46.0)
HEMOGLOBIN: 11.7 g/dL — AB (ref 12.0–15.0)
MCH: 25.1 pg — AB (ref 26.0–34.0)
MCHC: 31.1 g/dL (ref 30.0–36.0)
MCV: 80.5 fL (ref 78.0–100.0)
Platelets: 225 10*3/uL (ref 150–400)
RBC: 4.67 MIL/uL (ref 3.87–5.11)
RDW: 13.6 % (ref 11.5–15.5)
WBC: 6 10*3/uL (ref 4.0–10.5)

## 2017-03-05 MED ORDER — AMLODIPINE BESYLATE 2.5 MG PO TABS
2.5000 mg | ORAL_TABLET | Freq: Every day | ORAL | Status: DC
  Administered 2017-03-05: 2.5 mg via ORAL
  Filled 2017-03-05: qty 1

## 2017-03-05 MED ORDER — AMLODIPINE BESYLATE 5 MG PO TABS
5.0000 mg | ORAL_TABLET | Freq: Every day | ORAL | Status: DC
  Administered 2017-03-06: 5 mg via ORAL
  Filled 2017-03-05: qty 1

## 2017-03-05 MED ORDER — HYDRALAZINE HCL 20 MG/ML IJ SOLN
10.0000 mg | Freq: Once | INTRAMUSCULAR | Status: AC
  Administered 2017-03-05: 10 mg via INTRAVENOUS
  Filled 2017-03-05: qty 1

## 2017-03-05 MED ORDER — AMLODIPINE BESYLATE 2.5 MG PO TABS
2.5000 mg | ORAL_TABLET | Freq: Once | ORAL | Status: AC
  Administered 2017-03-05: 2.5 mg via ORAL
  Filled 2017-03-05: qty 1

## 2017-03-05 MED ORDER — SIMVASTATIN 5 MG PO TABS
10.0000 mg | ORAL_TABLET | Freq: Every day | ORAL | Status: DC
  Administered 2017-03-05: 10 mg via ORAL
  Filled 2017-03-05: qty 2

## 2017-03-05 NOTE — Progress Notes (Signed)
Physical Therapy Evaluation Patient Details Name: Ann Coleman MRN: [id number] DOB: 05-06-1924 Today's Date: 03/05/2017   History of Present Illness  Pt is an 83 y.o. female from [place] who presented to the [facility] ED on 03/04/17 with c/c of left leg pain for a couple of days. She fell in the bathroom on Wednesday evening due to left leg pain. Denies headache, chest pain or abdominal pain. No vision change, confusion or aphasia. Endorses SOB. Only neurological complaint is left leg weakness. No sensory loss. No history of stroke or MI. She does not take ASA at home. MRI at [facility] ED revealed a subacute ischemic infarction involving the posterior limb of the right internal capsule.  Clinical Impression  Pt admitted with above diagnosis. Pt currently with functional limitations due to the deficits listed below (see PT Problem List). PTA, pt was independent with mobility using RW, however, daughter reports pt had multiple falls at home. Pt's daughter interpreted during session. Pt presenting with decreased balance and strength (LLE>RLE) and required at least min guard to min A to prevent LOB during gait. Pt and family educated about safety with mobility and decreasing fall risk at home. Recommending HHPT to address strength and balance deficits at d/c. Will continue to follow acutely to maximize functional mobility independence.  Pt will benefit from skilled PT to increase their independence and safety with mobility to allow discharge to the venue listed below.       Follow Up Recommendations Home health PT;Supervision/Assistance - 24 hour;Supervision for mobility/OOB    Equipment Recommendations  Rolling walker with 5" wheels;3in1 (PT)    Recommendations for Other Services       Precautions / Restrictions Precautions Precautions: Fall Restrictions Weight Bearing Restrictions: No      Mobility  Bed Mobility               General bed mobility comments: Pt sitting OOB in chair upon  arrival  Transfers Overall transfer level: Needs assistance Equipment used: Rolling walker (2 wheeled) Transfers: Sit to/from Stand Sit to Stand: Min guard         General transfer comment: min guard for safety with balance  Ambulation/Gait Ambulation/Gait assistance: Min assist;Min guard Ambulation Distance (Feet): 100 Feet Assistive device: Rolling walker (2 wheeled) Gait Pattern/deviations: Step-through pattern;Decreased step length - left;Decreased weight shift to left;Drifts right/left;Trunk flexed (LLE drags) Gait velocity: Decreased Gait velocity interpretation: Below normal speed for age/gender General Gait Details: Pt with slow, unsteady gait. Requiring from min guard to min A to prevent LOB. Pt required multiple cues for safe use of RW during mobility and for appropriate proximitiy to device. Educated family about assist required at home with mobility and importance of assist to prevent falls.   Stairs            Wheelchair Mobility    Modified Rankin (Stroke Patients Only) Modified Rankin (Stroke Patients Only) Pre-Morbid Rankin Score: Slight disability Modified Rankin: Moderately severe disability     Balance Overall balance assessment: Needs assistance Sitting-balance support: No upper extremity supported;Feet unsupported Sitting balance-Leahy Scale: Normal     Standing balance support: Bilateral upper extremity supported;During functional activity Standing balance-Leahy Scale: Poor Standing balance comment: reliant on RW for mobility, leans against sink during grooming                             Pertinent Vitals/Pain Pain Assessment: Faces Pain Score: 2  Faces Pain Scale: Hurts a little bit  Pain Location: IV insertion point  Pain Descriptors / Indicators: Aching Pain Intervention(s): Limited activity within patient's tolerance;Monitored during session;Repositioned    Home Living Family/patient expects to be discharged to:: Private  residence Living Arrangements: Children Available Help at Discharge: Family;Friend(s);Available 24 hours/day Type of Home: House Home Access: Stairs to enter Entrance Stairs-Rails: None Entrance Stairs-Number of Steps: 2 Home Layout: One level Home Equipment: Walker - 4 wheels;Hand held shower head Additional Comments: Lives with daughter and has family that can come stay with her.     Prior Function Level of Independence: Independent with assistive device(s)         Comments: used a rollator and furniture walking, independent in ADL assist with IADL     Hand Dominance   Dominant Hand: Right    Extremity/Trunk Assessment   Upper Extremity Assessment Upper Extremity Assessment: Defer to OT evaluation    Lower Extremity Assessment Lower Extremity Assessment: LLE deficits/detail;Generalized weakness (RLE grossly 4-/5 throughout) LLE Deficits / Details: Pt dragging LLE during mobility; grossly 3+/5 throughout LLE.     Cervical / Trunk Assessment Cervical / Trunk Assessment: Other exceptions Cervical / Trunk Exceptions: Rounded shoulders/forward head  Communication   Communication: Prefers language other than English ([name] provided translation )  Cognition Arousal/Alertness: Awake/alert Behavior During Therapy: WFL for tasks assessed/performed;Impulsive Overall Cognitive Status: Within Functional Limits for tasks assessed                                 General Comments: Pt is always moving at home, explained we only want her getting up with staff for safety      General Comments General comments (skin integrity, edema, etc.): Pt's daughter and son present throughout session. Educated about safety at home and decreasing fall risk at home. Educated about importance of removing clutter and avoiding furniture walking. Educated about recommendations for use of RW to increase stability with mobility. Educated about HHPT services for balance and strength  training.     Exercises     Assessment/Plan    PT Assessment Patient needs continued PT services  PT Problem List Decreased strength;Decreased activity tolerance;Decreased balance;Decreased mobility;Decreased coordination;Decreased knowledge of use of DME;Decreased safety awareness;Decreased knowledge of precautions       PT Treatment Interventions DME instruction;Gait training;Stair training;Functional mobility training;Therapeutic activities;Therapeutic exercise;Balance training;Neuromuscular re-education;Patient/family education    PT Goals (Current goals can be found in the Care Plan section)  Acute Rehab PT Goals Patient Stated Goal: to get home PT Goal Formulation: With patient Time For Goal Achievement: 03/12/17 Potential to Achieve Goals: Good    Frequency Min 3X/week   Barriers to discharge        Co-evaluation PT/OT/SLP Co-Evaluation/Treatment: Yes Reason for Co-Treatment: Complexity of the patient's impairments (multi-system involvement);For patient/therapist safety;Other (comment) (use of interpreter ) PT goals addressed during session: Mobility/safety with mobility;Balance;Proper use of DME         End of Session Equipment Utilized During Treatment: Gait belt Activity Tolerance: Patient tolerated treatment well Patient left: in chair;with call bell/phone within reach;with chair alarm set;with family/visitor present Nurse Communication: Mobility status PT Visit Diagnosis: Unsteadiness on feet (R26.81);Muscle weakness (generalized) (M62.81);Repeated falls (R29.6)    Time: 1610-9604 PT Time Calculation (min) (ACUTE ONLY): 26 min   Charges:   PT Evaluation $PT Eval Moderate Complexity: 1 Procedure     PT G Codes:   PT G-Codes **NOT FOR INPATIENT CLASS** Functional Assessment Tool Used: AM-PAC 6  Clicks Basic Mobility;Clinical judgement Functional Limitation: Mobility: Walking and moving around Mobility: Walking and Moving Around Current Status [phone]): At  least 40 percent but less than 60 percent impaired, limited or restricted Mobility: Walking and Moving Around Goal Status [phone]): At least 1 percent but less than 20 percent impaired, limited or restricted    Margot Chimes, PT, DPT  Acute Rehabilitation Services  Pager: [phone]   Melvyn Novas 03/05/2017, 10:47 AM

## 2017-03-05 NOTE — Progress Notes (Signed)
PROGRESS NOTE    Ann Coleman  WUJ:[id number] DOB: 08-May-1924 DOA: 03/04/2017 PCP: No PCP Per Patient    Brief Narrative:   Ann Coleman is a 83 y.o. female with no significant past medical history who has not been to a physician for many years was experiencing left lower extremity weakness over the last 3 days. Patient also has been having frequent falls. Denies hitting her head or losing consciousness. Has no difficulty in speaking swallowing or visual symptoms or any weakness of other extremities.   ED Course: X-rays of the knee shows small effusion. MRI of the brain shows right internal capsule subacute stroke. Neurologist on-call was consulted and patient will be transferred for further To [facility]. On my exam patient still has weakness of the left lower extremity 3 x 5 in strength. Able to move all extremities. Patient passed swallow. Patient's blood pressure is found to be markedly elevated.    Assessment & Plan:   Principal Problem:   Acute right MCA stroke [facility]) Active Problems:   Hypertensive urgency   Stroke (cerebrum) (HCC)   #1 subacute infarction involving posterior limb of right internal capsule with edema extending to superior aspect right thalamus Patient admitted for stroke workup. MRI consistent with a subacute infarction involving the posterior limb of the right internal capsule with edema extending to superior aspect of the right thalamus. MRI also with multiple old infarcts of the basal ganglia as well as chronic microvascular ischemic changes. MRI with diminished signal in the left ICA. MRA with no large vessel occlusion. Patient still with left lower extremity weakness. Head CT negative. LDL of 110. Hemoglobin A1c pending. Carotid Dopplers preliminary readings with no significant ICA stenosis. Patient has been seen by PT/OT were recommended home health therapies as well as rolling walker and 3 and 1 with supervision. Will place patient on statin with goal LDL of less  than 70. Continue aspirin for secondary stroke prophylaxis. Continue to lower blood pressure. Will start Norvasc 2.5 mg daily and titrate as needed. Long-term blood pressure goal normotensive. Neurology following and appreciate input and recommendations.  #2 hypertensive urgency Patient presented with hypertensive urgency with blood pressure of 222/101 in the setting of neurological symptoms. Patient with subacute infarction. Permissive hypertension. Will gradually lower blood pressure. Start Norvasc 2.5 mg daily and titrate as needed. Follow.     DVT prophylaxis: SCDs Code Status: Full Family Communication: Updated patient and family at bedside. Daughter-in-law served as Equities trader. Disposition Plan: Home with home health, once stroke workup is completed and per neurology hopefully tomorrow.   Consultants:   Neurology: Dr.Lindzen 03/04/2017  Procedures:   CT head and CT C-spine 03/04/2017  Chest x-ray 03/04/2017  Plain films of bilateral knees 03/04/2017  MRI MRA head 03/04/2017, 03/05/2017  Carotid Dopplers 03/05/2017  2-D echo pending  Antimicrobials:   None   Subjective: Patient laying in bed. Patient states still some weakness in left lower extremity. Patient denies any chest pain. No shortness of breath.  Objective: Vitals:   03/05/17 1030 03/05/17 1157 03/05/17 1306 03/05/17 1742  BP: (!) 179/81 (!) 191/82 (!) 173/76 (!) 206/75  Pulse: 72 76 78 94  Resp: Temp: 98.2 F (36.8 C) 98.1 F (36.7 C) 98.2 F (36.8 C) 99 F (37.2 C)  TempSrc: Oral Oral Oral Oral  SpO2: 93% 94% 94% 95%  Weight:      Height:        Intake/Output Summary (Last 24 hours) at 03/05/17 1811  Last data filed at 03/05/17 1744  Gross per 24 hour  Intake          1256.25 ml  Output              400 ml  Net           856.25 ml   Filed Weights   03/04/17 1058 03/04/17 2346  Weight: 64.4 kg (142 lb) 59.5 kg (131 lb 2.8 oz)    Examination:  General exam: Appears  calm and comfortable  Respiratory system: Clear to auscultation. Respiratory effort normal. Cardiovascular system: S1 & S2 heard, RRR. No JVD, murmurs, rubs, gallops or clicks. No pedal edema. Gastrointestinal system: Abdomen is nondistended, soft and nontender. No organomegaly or masses felt. Normal bowel sounds heard. Central nervous system: Alert and oriented. No focal neurological deficits. Extremities: Symmetric 5 x 5 power. Skin: No rashes, lesions or ulcers Psychiatry: Judgement and insight appear normal. Mood & affect appropriate.     Data Reviewed: I have personally reviewed following labs and imaging studies  CBC:  Recent Labs Lab 03/04/17 1252 03/05/17 0451  WBC 5.9 6.0  NEUTROABS 2.8  --   HGB 12.4 11.7*  HCT 39.4 37.6  MCV 80.7 80.5  PLT 240 225   Basic Metabolic Panel:  Recent Labs Lab 03/04/17 1252 03/05/17 0451  NA 141 139  K 3.2* 3.5  CL 103 104  CO2 31 27  GLUCOSE 93 84  BUN 9 9  CREATININE 0.86 0.80  CALCIUM 8.7* 8.6*   GFR: Estimated Creatinine Clearance: 35.9 mL/min (by C-G formula based on SCr of 0.8 mg/dL). Liver Function Tests:  Recent Labs Lab 03/04/17 1252 03/05/17 0451  AST 19 23  ALT 13* 14  ALKPHOS 79 76  BILITOT 0.8 0.9  PROT 7.7 6.8  ALBUMIN 3.5 3.0*   No results for input(s): LIPASE, AMYLASE in the last 168 hours. No results for input(s): AMMONIA in the last 168 hours. Coagulation Profile: No results for input(s): INR, PROTIME in the last 168 hours. Cardiac Enzymes: No results for input(s): CKTOTAL, CKMB, CKMBINDEX, TROPONINI in the last 168 hours. BNP (last 3 results) No results for input(s): PROBNP in the last 8760 hours. HbA1C: No results for input(s): HGBA1C in the last 72 hours. CBG: No results for input(s): GLUCAP in the last 168 hours. Lipid Profile:  Recent Labs  03/05/17 0451  CHOL 182  HDL 47  LDLCALC 110*  TRIG 124  CHOLHDL 3.9   Thyroid Function Tests: No results for input(s): TSH, T4TOTAL,  FREET4, T3FREE, THYROIDAB in the last 72 hours. Anemia Panel: No results for input(s): VITAMINB12, FOLATE, FERRITIN, TIBC, IRON, RETICCTPCT in the last 72 hours. Sepsis Labs: No results for input(s): PROCALCITON, LATICACIDVEN in the last 168 hours.  Recent Results (from the past 240 hour(s))  Urine culture     Status: Abnormal   Collection Time: 03/04/17 12:30 PM  Result Value Ref Range Status   Specimen Description URINE, RANDOM  Final   Special Requests NONE  Final   Culture (A)  Final    <10,000 COLONIES/mL INSIGNIFICANT GROWTH Performed at [facility], [address] [place], [place] [address]    Report Status 03/05/2017 FINAL  Final         Radiology Studies: Dg Chest 2 View  Result Date: 03/04/2017 CLINICAL DATA:  Palpitations.  Weakness and falls EXAM: CHEST  2 VIEW COMPARISON:  12/16/2013 FINDINGS: Chronic cardiomegaly and aortic tortuosity. Chronic widening of the upper mediastinum, usually from  ectatic great vessels or thyroid enlargement, stable. There is no edema, consolidation, effusion, or pneumothorax. No acute osseous finding. IMPRESSION: 1. No acute finding when compared to prior. 2. Chronic cardiomegaly. Electronically Signed   By: Marnee Spring M.D.   On: 03/04/2017 12:53   Ct Head Wo Contrast  Result Date: 03/04/2017 CLINICAL DATA:  Patient fell in the bathroom. EXAM: CT HEAD WITHOUT CONTRAST CT CERVICAL SPINE WITHOUT CONTRAST TECHNIQUE: Multidetector CT imaging of the head and cervical spine was performed following the standard protocol without intravenous contrast. Multiplanar CT image reconstructions of the cervical spine were also generated. COMPARISON:  None. FINDINGS: CT HEAD FINDINGS Brain: There is no evidence for acute hemorrhage, hydrocephalus, mass lesion, or abnormal extra-axial fluid collection. No definite CT evidence for acute infarction. Diffuse loss of parenchymal volume is consistent with atrophy. Patchy low attenuation in the deep  hemispheric and periventricular white matter is nonspecific, but likely reflects chronic microvascular ischemic demyelination. Lacunar infarct noted left basal ganglia. Vascular: No hyperdense vessel or unexpected calcification. Skull: No evidence for fracture. No worrisome lytic or sclerotic lesion. Sinuses/Orbits: The visualized paranasal sinuses and mastoid air cells are clear. Visualized portions of the globes and intraorbital fat are unremarkable. Other: None. CT CERVICAL SPINE FINDINGS Alignment: No subluxation. Mild straightening of normal cervical lordosis is evident. Facets are well aligned bilaterally. Skull base and vertebrae: No fracture. Soft tissues and spinal canal: 2.7 cm heterogeneous right thyroid nodule. 1.9 cm rim calcified left thyroid nodule. No prevertebral soft tissue edema. Disc levels:  Relatively preserved throughout. Upper chest:  Biapical pleural-parenchymal scarring. Other: None. IMPRESSION: 1. No acute intracranial abnormality. 2. Atrophy with chronic small vessel white matter ischemic disease. 3. No cervical spine fracture. 4. Loss of cervical lordosis. This can be related to patient positioning, muscle spasm or soft tissue injury. 5. Bilateral thyroid nodules measuring up to 2.7 cm diameter. Thyroid ultrasound recommended to further evaluate. Electronically Signed   By: Kennith Center M.D.   On: 03/04/2017 13:43   Ct Cervical Spine Wo Contrast  Result Date: 03/04/2017 CLINICAL DATA:  Patient fell in the bathroom. EXAM: CT HEAD WITHOUT CONTRAST CT CERVICAL SPINE WITHOUT CONTRAST TECHNIQUE: Multidetector CT imaging of the head and cervical spine was performed following the standard protocol without intravenous contrast. Multiplanar CT image reconstructions of the cervical spine were also generated. COMPARISON:  None. FINDINGS: CT HEAD FINDINGS Brain: There is no evidence for acute hemorrhage, hydrocephalus, mass lesion, or abnormal extra-axial fluid collection. No definite CT  evidence for acute infarction. Diffuse loss of parenchymal volume is consistent with atrophy. Patchy low attenuation in the deep hemispheric and periventricular white matter is nonspecific, but likely reflects chronic microvascular ischemic demyelination. Lacunar infarct noted left basal ganglia. Vascular: No hyperdense vessel or unexpected calcification. Skull: No evidence for fracture. No worrisome lytic or sclerotic lesion. Sinuses/Orbits: The visualized paranasal sinuses and mastoid air cells are clear. Visualized portions of the globes and intraorbital fat are unremarkable. Other: None. CT CERVICAL SPINE FINDINGS Alignment: No subluxation. Mild straightening of normal cervical lordosis is evident. Facets are well aligned bilaterally. Skull base and vertebrae: No fracture. Soft tissues and spinal canal: 2.7 cm heterogeneous right thyroid nodule. 1.9 cm rim calcified left thyroid nodule. No prevertebral soft tissue edema. Disc levels:  Relatively preserved throughout. Upper chest:  Biapical pleural-parenchymal scarring. Other: None. IMPRESSION: 1. No acute intracranial abnormality. 2. Atrophy with chronic small vessel white matter ischemic disease. 3. No cervical spine fracture. 4. Loss of cervical lordosis. This can be  related to patient positioning, muscle spasm or soft tissue injury. 5. Bilateral thyroid nodules measuring up to 2.7 cm diameter. Thyroid ultrasound recommended to further evaluate. Electronically Signed   By: Kennith Center M.D.   On: 03/04/2017 13:43   Mr Brain Wo Contrast  Result Date: 03/04/2017 CLINICAL DATA:  Left leg weakness.  Multiple recent falls. EXAM: MRI HEAD WITHOUT CONTRAST TECHNIQUE: Multiplanar, multiecho pulse sequences of the brain and surrounding structures were obtained without intravenous contrast. COMPARISON:  Head CT 03/04/2017 FINDINGS: Despite efforts by the technologist and patient, motion artifact is present on today's examination and could not be eliminated. The  findings of this study are interpreted in the context of that limitation. Brain: There is high signal on diffusion-weighted imaging along the course of the posterior limb of the right internal capsule without diffusion restriction on the ADC map. There is an area of hyperintense T2 weighted signal surrounding this lesion extending into the superior aspect of the right thalamus. No acute intraparenchymal hematoma. There are multiple old basal ganglia lacunar infarcts. There is beginning confluent hyperintense T2-weighted signal within the periventricular white matter, most often seen in the setting of chronic microvascular ischemia. Vascular: Major intracranial arterial and venous sinus flow voids are preserved. No evidence of chronic microhemorrhage or amyloid angiopathy. Skull and upper cervical spine: The visualized skull base, calvarium, upper cervical spine and extracranial soft tissues are normal. Sinuses/Orbits: No fluid levels or advanced mucosal thickening. No mastoid effusion. Normal orbits. IMPRESSION: 1. Likely subacute ischemia involving the posterior limb of the right internal capsule with edema extending into the superior aspect of the right thalamus. This is in keeping with reported left-sided weakness. 2. No hemorrhage or significant mass effect. 3. Multiple old lacunar infarcts of the basal ganglia and findings of chronic microvascular ischemia. Electronically Signed   By: Deatra Robinson M.D.   On: 03/04/2017 19:19   Dg Knee Complete 4 Views Left  Result Date: 03/04/2017 CLINICAL DATA:  Chronic left knee pain, aggravated by a fall this morning. EXAM: LEFT KNEE - COMPLETE 4+ VIEW COMPARISON:  None. FINDINGS: The patient has severe tricompartmental osteoarthritis. There is no fracture or dislocation or appreciable joint effusion. Loose bodies in the joint. IMPRESSION: No acute abnormalities.  Severe tricompartmental osteoarthritis. Electronically Signed   By: Francene Boyers M.D.   On: 03/04/2017  11:54   Dg Knee Complete 4 Views Right  Result Date: 03/04/2017 CLINICAL DATA:  Left knee pain and weakness, fall, bilateral knee pain EXAM: RIGHT KNEE - COMPLETE 4+ VIEW COMPARISON:  None. FINDINGS: Four views of the right knee submitted. There is diffuse narrowing of joint space. Mild spurring medial and lateral tibial plateau. There is spurring of lateral femoral condyle. Significant narrowing of patellofemoral joint space. Spurring of patella. Small joint effusion. Diffuse osteopenia. No acute fracture or subluxation. IMPRESSION: No acute fracture or subluxation. Osteoarthritic changes as described above. Diffuse osteopenia. Small joint effusion. Electronically Signed   By: Natasha Mead M.D.   On: 03/04/2017 11:54   Mr Maxine Glenn Head/brain ZO Cm  Result Date: 03/05/2017 CLINICAL DATA:  83 y/o  F; evaluation for stroke. EXAM: MRA HEAD WITHOUT CONTRAST TECHNIQUE: Angiographic images of the Circle of Willis were obtained using MRA technique without intravenous contrast. COMPARISON:  03/04/2017 MRI of the head. FINDINGS: Motion artifact, suboptimal evaluation for subtle stenosis or aneurysm. Internal carotid arteries: Patent right internal carotid artery without significant stenosis. Patent left internal carotid artery with diminished flow related signal of the distal cavernous to terminal  segments indicating probable underlying moderate to severe stenosis. Anterior cerebral arteries: Patent. Question stenosis of left A1 origin. Middle cerebral arteries: Patent. Diminished flow related signal in left MCA distribution in comparison with the right is likely due to stenosis in the left ICA. Anterior communicating artery: Patent. Posterior communicating arteries: Not identified, likely hypoplastic or absent. Posterior cerebral arteries:  Patent. Basilar artery:  Patent. Vertebral arteries:  Patent. IMPRESSION: 1. Moderate motion artifact, suboptimal evaluation for subtle stenosis or aneurysm. 2. Patent circle of Willis.   No large vessel occlusion. 3. Diminished signal within the left internal carotid artery from distal cavernous to terminal segment indicating probable underlying moderate to severe stenosis. 4. Asymmetric reduced signal in left MCA distribution likely due to upstream ICA stenosis. Electronically Signed   By: Mitzi Hansen M.D.   On: 03/05/2017 03:39        Scheduled Meds: . amLODipine  2.5 mg Oral Once  . [START ON 03/06/2017] amLODipine  5 mg Oral Daily  . aspirin  300 mg Rectal Daily   Or  . aspirin  325 mg Oral Daily  . hydrALAZINE  10 mg Intravenous Once  . simvastatin  10 mg Oral q1800   Continuous Infusions: . sodium chloride 75 mL/hr at 03/05/17 0637     LOS: 0 days    Time spent: 35 minutes.    [facility], MD [facility] Pager [phone]  If 7PM-7AM, please contact night-coverage [email or web address] Password [facility] 03/05/2017, 6:11 PM

## 2017-03-05 NOTE — Care Management Note (Addendum)
Case Management Note  Patient Details  Name: Ann Coleman MRN: [id number] Date of Birth: 10-25-1924  Subjective/Objective:                 Spoke with family at bedside, who provided number for daughter [name], [phone] to make decisions about HH. [name] provided choice, would like to use [facility] for provider. PT eval rec RW and 3in1. Medicaid card shows [facility] [place] as PCP, however [name] states this office id closed, and patient has not been to MD in a while. They were referred to the [facility] (Phone: [phone]) CM attempted to contact, but line states disconnected. Confirmed patient will DC to address on facesheet with support of daughter. Patient will need HH orders, as well as PCP apt.  13:20 Referral made to [facility] for DME 3 in 1 to be delivered to room. Spoke with [facility], confirmed they are able to take patient w/o PCP.    Action/Plan:  CM will continue to follow.  Expected Discharge Date:   (unknown)               Expected Discharge Plan:  Home w Home Health Services  In-House Referral:     Discharge planning Services  CM Consult  Post Acute Care Choice:    Choice offered to:  Adult Children ([name] [phone])  DME Arranged:    DME Agency:     HH Arranged:    HH Agency:     Status of Service:  In process, will continue to follow  If discussed at Long Length of Stay Meetings, dates discussed:    Additional Comments:  [name], RN 03/05/2017, 12:11 PM

## 2017-03-05 NOTE — Progress Notes (Signed)
*  PRELIMINARY RESULTS* Vascular Ultrasound Carotid Duplex (Doppler) has been completed.  Preliminary findings: technically difficult study due to short neck. Appears to be 1-39% ICA stenosis bilaterally.  Farrel Demark, RDMS, RVT  03/05/2017, 11:30 AM

## 2017-03-05 NOTE — Evaluation (Signed)
Speech Language Pathology Evaluation Patient Details Name: Ann Coleman MRN: [id number] DOB: 07/29/1924 Today's Date: 03/05/2017 Time: 0750-0820 SLP Time Calculation (min) (ACUTE ONLY): 30 min  Problem List:  Patient Active Problem List   Diagnosis Date Noted  . Acute right MCA stroke (HCC) 03/04/2017  . Hypertensive urgency 03/04/2017  . Stroke (cerebrum) (HCC) 03/04/2017   Past Medical History:  Past Medical History:  Diagnosis Date  . Hypertension    Past Surgical History: History reviewed. No pertinent surgical history. HPI:  83 yo female from [place] admitted to [facility] after multiple falls over many days.  Pt PMH + for HTN, DM.  Pt found to have subacute cva impacting right internal capsule and old bilateral basal ganglia CVAs.  Pt has been in [place] since 1983 but does not speak [place].  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     Assessment / Plan / Recommendation Clinical Impression  [name] used as interpreter as [employer] interpreter Ipad not functioning adequately at this time.   Pt with no dysarthria or dysphasia and able to verbalize need to call for assistance to prevent fall.  She is able to follow 2 step commands without delays.    Family reports pt without speech/cognitive changes with this event.  [name] reports pt can have 24/7 supervision by multiple family members. As pt has support from family as is at baseline, no SlP follow up indicated.      SLP Assessment  SLP Recommendation/Assessment: Patient does not need any further Speech Lanaguage Pathology Services SLP Visit Diagnosis: Cognitive communication deficit (R41.841)    Follow Up Recommendations  None    Frequency and Duration           SLP Evaluation Cognition  Overall Cognitive Status: Within Functional Limits for tasks assessed Arousal/Alertness: Awake/alert Orientation Level: Oriented to person;Oriented to place;Other (comment) (didn't ask  orientation to date, pt does not read per daughter) Attention: Sustained Sustained Attention: Appears intact Memory: Appears intact Awareness: Appears intact (to muscular weakness and need to call for assist)       Comprehension  Auditory Comprehension Overall Auditory Comprehension: Appears within functional limits for tasks assessed Yes/No Questions: Not tested Commands: Within Functional Limits Conversation: Simple Visual Recognition/Discrimination Discrimination: Not tested Reading Comprehension Reading Status: Not tested (pt does not read)    Expression Expression Primary Mode of Expression: Verbal Verbal Expression Overall Verbal Expression: Appears within functional limits for tasks assessed Initiation: No impairment Repetition: No impairment Naming: Not tested Pragmatics: No impairment Written Expression Dominant Hand: Right Written Expression: Not tested (per daughter pt with no formal education)   Oral / Motor  Oral Motor/Sensory Function Overall Oral Motor/Sensory Function: Within functional limits Motor Speech Overall Motor Speech: Appears within functional limits for tasks assessed Respiration: Within functional limits Phonation: Normal Resonance: Within functional limits Articulation: Within functional limitis Intelligibility: Intelligible Motor Planning: Witnin functional limits   GO          Functional Assessment Tool Used: clinical judgement Functional Limitations: Motor speech Motor Speech Current Status [phone]): 0 percent impaired, limited or restricted Motor Speech Goal Status (X9147): 0 percent impaired, limited or restricted Motor Speech Goal Status (W2956): 0 percent impaired, limited or restricted         Mills Koller, MS [facility] SLP [phone]

## 2017-03-05 NOTE — Progress Notes (Signed)
Pt a transfer from [facility] ED by Care link for stroke work up  Pt 83 yrs old [name] speaks little [place] daughter at bedside to interpreter,

## 2017-03-05 NOTE — Evaluation (Signed)
Occupational Therapy Evaluation Patient Details Name: Ann Coleman MRN: [id number] DOB: August 08, 1924 Today's Date: 03/05/2017    History of Present Illness Pt is an 83 y.o. female from [place] who presented to the [facility] ED on 03/04/17 with c/c of left leg pain for a couple of days. She fell in the bathroom on Wednesday evening due to left leg pain. Denies headache, chest pain or abdominal pain. No vision change, confusion or aphasia. Endorses SOB. Only neurological complaint is left leg weakness. No sensory loss. No history of stroke or MI. She does not take ASA at home. MRI at [facility] ED revealed a subacute ischemic infarction involving the posterior limb of the right internal capsule.   Clinical Impression   PTA Pt independent in ADL and used a rollator for long walking, furniture walking in the house. Pt currently min guard for ADL and min A for ambulation with RW (cues needed). Daughter provided translation during session. Pt with decreased in balance from before according to daughter. Pt will have 24 hour supervision/assistance. Pt will benefit from skilled OT in the acute setting prior to dc home with family supervision. Next session to focus on standing balance and tub transfer.     Follow Up Recommendations  Supervision/Assistance - 24 hour    Equipment Recommendations  3 in 1 bedside commode    Recommendations for Other Services       Precautions / Restrictions        Mobility Bed Mobility               General bed mobility comments: Pt sitting OOB in chair upon arrival  Transfers Overall transfer level: Needs assistance Equipment used: Rolling walker (2 wheeled) Transfers: Sit to/from Stand Sit to Stand: Min guard         General transfer comment: min guard for safety with balance    Balance Overall balance assessment: Needs assistance Sitting-balance support: No upper extremity supported;Feet unsupported Sitting balance-Leahy Scale: Normal     Standing balance support:  Bilateral upper extremity supported;During functional activity Standing balance-Leahy Scale: Poor Standing balance comment: reliant on RW for mobility, leans against sink during grooming                           ADL either performed or assessed with clinical judgement   ADL Overall ADL's : Needs assistance/impaired Eating/Feeding: Set up;Sitting Eating/Feeding Details (indicate cue type and reason): in recliner - able to eat food without dentures Grooming: Wash/dry hands;Min guard;Standing Grooming Details (indicate cue type and reason): sink level Upper Body Bathing: Min guard;Sitting;With caregiver independent assisting Upper Body Bathing Details (indicate cue type and reason): educated Pt/family on use of 3 in 1 as shower chair Lower Body Bathing: Min guard;With caregiver independent assisting;Sit to/from stand   Upper Body Dressing : Set up;Sitting   Lower Body Dressing: Min guard;With caregiver independent assisting;Sit to/from stand   Toilet Transfer: Ambulation;Minimal assistance;BSC;RW;Cueing for sequencing Toilet Transfer Details (indicate cue type and reason): BSC over regular height toilet Toileting- Clothing Manipulation and Hygiene: Sit to/from stand;Minimal assistance;With caregiver independent assisting Toileting - Clothing Manipulation Details (indicate cue type and reason): min A for final adjustment on underwear Tub/ Shower Transfer: Tub transfer;Minimal assistance;With caregiver independent assisting;Ambulation;3 in 1;Rolling walker Tub/Shower Transfer Details (indicate cue type and reason): educated Pt and family for 3 in 1 as shower chair and how to adjust legs Functional mobility during ADLs: Minimal assistance;Rolling walker;Cueing for safety General ADL Comments: Daughter translating  Vision Patient Visual Report: No change from baseline Vision Assessment?: No apparent visual deficits     Perception     Praxis      Pertinent Vitals/Pain  Pain Assessment: Faces Pain Score: 2  Pain Location: pain from blood pressure cuff and IV Pain Descriptors / Indicators: Aching Pain Intervention(s): Limited activity within patient's tolerance     Hand Dominance Right   Extremity/Trunk Assessment Upper Extremity Assessment Upper Extremity Assessment: Generalized weakness (no noticeable differences between L and R)   Lower Extremity Assessment Lower Extremity Assessment: Defer to PT evaluation   Cervical / Trunk Assessment Cervical / Trunk Assessment:  (rounded shoulders and forward head)   Communication Communication Communication: Prefers language other than [place] ([name] - [name] provided translation)   Cognition Arousal/Alertness: Awake/alert Behavior During Therapy: WFL for tasks assessed/performed Overall Cognitive Status: Within Functional Limits for tasks assessed                                 General Comments: Pt is always moving at home, explained we only want her getting up with staff for safety   General Comments  Pt's daughter and son present for session    Exercises     Shoulder Instructions      Home Living Family/patient expects to be discharged to:: Private residence Living Arrangements: Children Available Help at Discharge: Family;Friend(s);Available 24 hours/day Type of Home: House Home Access: Stairs to enter [employer] of Steps: 2 Entrance Stairs-Rails: None Home Layout: One level     Bathroom Shower/Tub: Chief Strategy Officer: Standard     Home Equipment: Environmental consultant - 4 wheels;Hand held shower head      Lives With: Other (Comment) (was w/son, now w/daughter)    Prior Functioning/Environment Level of Independence: Independent with assistive device(s)        Comments: used a rollator and furniture walking, independent in ADL assist with IADL        OT Problem List: Decreased activity tolerance;Impaired balance (sitting and/or  standing);Decreased safety awareness;Decreased knowledge of use of DME or AE      OT Treatment/Interventions: Self-care/ADL training;DME and/or AE instruction;Therapeutic activities;Patient/family education;Balance training    OT Goals(Current goals can be found in the care plan section) Acute Rehab OT Goals Patient Stated Goal: to get home OT Goal Formulation: With patient/family Time For Goal Achievement: 03/19/17 Potential to Achieve Goals: Good ADL Goals Pt Will Perform Upper Body Bathing: with modified independence;sitting Pt Will Perform Lower Body Bathing: with modified independence;sit to/from stand Pt Will Transfer to Toilet: with supervision;bedside commode;ambulating (with RW) Pt Will Perform Toileting - Clothing Manipulation and hygiene: with modified independence;sit to/from stand Pt Will Perform Tub/Shower Transfer: Tub transfer;with min guard assist;with caregiver independent in assisting;ambulating;3 in 1;rolling walker  OT Frequency: Min 2X/week   Barriers to D/C:            Co-evaluation              End of Session Equipment Utilized During Treatment: Gait belt;Rolling walker Nurse Communication: Mobility status  Activity Tolerance: Patient tolerated treatment well Patient left: in chair;with call bell/phone within reach;with chair alarm set;with family/visitor present (explained not to get up without staff)  OT Visit Diagnosis: Unsteadiness on feet (R26.81)                Time: 1610-9604 OT Time Calculation (min): 26 min Charges:  OT General Charges $OT Visit: 1  Procedure OT Evaluation $OT Eval Moderate Complexity: 1 Procedure G-Codes: OT G-codes **NOT FOR INPATIENT CLASS** Functional Assessment Tool Used: AM-PAC 6 Clicks Daily Activity Functional Limitation: Self care Self Care Current Status (Z6109): At least 20 percent but less than 40 percent impaired, limited or restricted Self Care Goal Status (U0454): At least 1 percent but less than 20  percent impaired, limited or restricted   Sherryl Manges OTR/L [phone] Evern Bio Joice Nazario 03/05/2017, 9:48 AM

## 2017-03-05 NOTE — Care Management Note (Signed)
Case Management Note  Patient Details  Name: Ann Coleman MRN: [id number] Date of Birth: 15-Dec-1923  Subjective/Objective:      Patient presented with frequent falls, Left lower extremity weakness x3 days.  Lives at home with adult children. Patient speaks [name], daughter speaks [place].  CM will follow for discharge needs pending therapy evals and physician orders.               Action/Plan:   Expected Discharge Date:   (unknown)               Expected Discharge Plan:     In-House Referral:     Discharge planning Services     Post Acute Care Choice:    Choice offered to:     DME Arranged:    DME Agency:     HH Arranged:    HH Agency:     Status of Service:     If discussed at [employer] of Stay Meetings, dates discussed:    Additional Comments:  Anda Kraft, RN 03/05/2017, 9:40 AM

## 2017-03-05 NOTE — Progress Notes (Signed)
STROKE TEAM PROGRESS NOTE   SUBJECTIVE (INTERVAL HISTORY) Her son and daughter-in-law are at the bedside.  Patient just back from vascular lab.    OBJECTIVE Temp:  [97.3 F (36.3 C)-98.8 F (37.1 C)] 98.1 F (36.7 C) (04/05 1157) Pulse Rate:  [64-90] 76 (04/05 1157) Cardiac Rhythm: Normal sinus rhythm (04/05 0700) Resp:  [14-20] 18 (04/05 1157) BP: (144-222)/(73-145) 191/82 (04/05 1157) SpO2:  [90 %-97 %] 94 % (04/05 1157) Weight:  [59.5 kg (131 lb 2.8 oz)] 59.5 kg (131 lb 2.8 oz) (04/04 2346)  CBC:  Recent Labs Lab 03/04/17 1252 03/05/17 0451  WBC 5.9 6.0  NEUTROABS 2.8  --   HGB 12.4 11.7*  HCT 39.4 37.6  MCV 80.7 80.5  PLT 240 225    Basic Metabolic Panel:  Recent Labs Lab 03/04/17 1252 03/05/17 0451  NA 141 139  K 3.2* 3.5  CL 103 104  CO2 31 27  GLUCOSE 93 84  BUN 9 9  CREATININE 0.86 0.80  CALCIUM 8.7* 8.6*   HgbA1c: No results found for: HGBA1C   PHYSICAL EXAM Pleasant elderly asian lady not in distress. . Afebrile. Head is nontraumatic. Neck is supple without bruit.    Cardiac exam no murmur or gallop. Lungs are clear to auscultation. Distal pulses are well felt. Neurological Exam :  Awake alert oriented x 3 normal speech and language. Mild left lower face asymmetry. Tongue midline. Mild LLE drift. Mild diminished fine finger movements on left. Orbits right over left upper extremity. Mild left grip and hip flexor weak.. Normal sensation . Normal coordination. Gait deferred ASSESSMENT/PLAN [name] is a 83 y.o. female with history of frequent falls presenting with LLE weakness. MRI shows a subacute stroke. She did not receive IV t-PA.   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 atrophy. small vessel disease.   CT CS no fx. Loss of cervical lordosis. B thryoid nodules  MRI  Subacute R PLIC infarct. No hmg. Old BG lacunes. small vessel disease.   MRA  No LVO.  Diminished signal L ICA, ? stenosis.  Carotid Doppler  B ICA 1-39% stenosis, VAs antegrade   2D Echo  pending   LDL 110  HgbA1c pending  SCDs for VTE prophylaxis  Diet Heart Room service appropriate? Yes; Fluid consistency: Thin  No antithrombotic prior to admission, now on aspirin 325 mg daily  Therapy recommendations:  HH PT, RW w/ 3" wheels, 3N1, supervision  Disposition:  Return home (lives w/ dtr)  Hypertensive Urgency  BP 222/101 on admission in setting of neurologic symptoms  Elevated but lowering  On norvasc 2.5 mg daily  Long-term BP goal normotensive  Hyperlipidemia  Home meds:  No statin   LDL 110 goal  Now on zocor 10 mg daily  Continue statin at discharge  Other Stroke Risk Factors  Advanced age  UDS not performed   Hospital day # 0  Rhoderick Moody [facility] See Amion for Pager information 03/05/2017 12:44 PM  I have personally examined this patient, reviewed notes, independently viewed imaging studies, participated in medical decision making and plan of care.ROS completed by me personally and pertinent positives fully documented  I have made any additions or clarifications directly to the above note. Agree with note above. She presented with subacute left hemiparesis secondary to right subcortical infarct from small vessel disease. Recommend aspirin for stroke prevention as well as  statin. Continue ongoing stroke workup. Long discussion of the bedside with the patient, son and daughter-in-law and answered questions. Greater than 50% time during this 35 minute visit was spent on counseling and coordination of care about her lacunar infarct, discussion about stroke risk, prevention and treatment options and answering questions.  Delia Heady, MD Medical Director [facility] Pager: [phone] 03/05/2017 3:11 PM  To contact Stroke Continuity provider, please refer to [email or web address]. After hours, contact General Neurology

## 2017-03-06 ENCOUNTER — Inpatient Hospital Stay (HOSPITAL_COMMUNITY): Payer: Medicare Other

## 2017-03-06 DIAGNOSIS — I6789 Other cerebrovascular disease: Secondary | ICD-10-CM

## 2017-03-06 LAB — ECHOCARDIOGRAM COMPLETE
Height: 59 in
WEIGHTICAEL: 2098.78 [oz_av]

## 2017-03-06 LAB — BASIC METABOLIC PANEL
Anion gap: 9 (ref 5–15)
BUN: 11 mg/dL (ref 6–20)
CALCIUM: 8.8 mg/dL — AB (ref 8.9–10.3)
CO2: 26 mmol/L (ref 22–32)
Chloride: 106 mmol/L (ref 101–111)
Creatinine, Ser: 0.77 mg/dL (ref 0.44–1.00)
GFR calc Af Amer: >60 mL/min (ref >60)
GLUCOSE: 96 mg/dL (ref 65–99)
Potassium: 3.3 mmol/L — ABNORMAL LOW (ref 3.5–5.1)
Sodium: 141 mmol/L (ref 135–145)

## 2017-03-06 LAB — HEMOGLOBIN A1C
HEMOGLOBIN A1C: 5.6 % (ref 4.8–5.6)
MEAN PLASMA GLUCOSE: 114 mg/dL

## 2017-03-06 LAB — MAGNESIUM: Magnesium: 2.1 mg/dL (ref 1.7–2.4)

## 2017-03-06 MED ORDER — HYDROCHLOROTHIAZIDE 12.5 MG PO CAPS
12.5000 mg | ORAL_CAPSULE | Freq: Every day | ORAL | Status: DC
  Administered 2017-03-06: 12.5 mg via ORAL
  Filled 2017-03-06: qty 1

## 2017-03-06 MED ORDER — AMLODIPINE BESYLATE 5 MG PO TABS: 5.0000 mg | ORAL_TABLET | Freq: Every day | ORAL | 2 refills | Status: DC

## 2017-03-06 MED ORDER — SIMVASTATIN 10 MG PO TABS: 10.0000 mg | ORAL_TABLET | Freq: Every day | ORAL | 0 refills | Status: DC

## 2017-03-06 MED ORDER — ASPIRIN 325 MG PO TABS: 325.0000 mg | ORAL_TABLET | Freq: Every day | ORAL | Status: DC

## 2017-03-06 MED ORDER — HYDROCHLOROTHIAZIDE 12.5 MG PO CAPS: 12.5000 mg | ORAL_CAPSULE | Freq: Every day | ORAL | 0 refills | Status: DC

## 2017-03-06 MED ORDER — POTASSIUM CHLORIDE CRYS ER 20 MEQ PO TBCR
40.0000 meq | EXTENDED_RELEASE_TABLET | Freq: Once | ORAL | Status: AC
  Administered 2017-03-06: 40 meq via ORAL
  Filled 2017-03-06: qty 2

## 2017-03-06 NOTE — Progress Notes (Signed)
qPhysical Therapy Treatment Patient Details Name: Ann Coleman MRN: [id number] DOB: 09-16-1924 Today's Date: 03/06/2017    History of Present Illness Pt is an 83 y.o. female from [place] who presented to the [facility] ED on 03/04/17 with c/c of left leg pain for a couple of days. She fell in the bathroom on Wednesday evening due to left leg pain. Denies headache, chest pain or abdominal pain. No vision change, confusion or aphasia. Endorses SOB. Only neurological complaint is left leg weakness. No sensory loss. No history of stroke or MI. She does not take ASA at home. MRI at [facility] ED revealed a subacute ischemic infarction involving the posterior limb of the right internal capsule.    PT Comments    Pt with increased ambulation tolerance, however, continues to exhibit decreased safety awareness with use of RW. Education to daughter to remind pt about appropriate use at home to prevent falls. Handout provided to daughter about fall prevention at home. Current recommendations remain appropriate. Will continue to follow to maximize functional mobility independence.    Follow Up Recommendations  Home health PT;Supervision/Assistance - 24 hour;Supervision for mobility/OOB     Equipment Recommendations  Rolling walker with 5" wheels;3in1 (PT)    Recommendations for Other Services       Precautions / Restrictions Precautions Precautions: Fall Restrictions Weight Bearing Restrictions: No    Mobility  Bed Mobility Overal bed mobility: Modified Independent             General bed mobility comments: Required extended time   Transfers Overall transfer level: Needs assistance Equipment used: Rolling walker (2 wheeled) Transfers: Sit to/from Stand Sit to Stand: Min guard         General transfer comment: min guard for safety with balance. Verbal cues for appropriate UE placement. Education to daughter about reminding pt not to pull up on RW at home   Ambulation/Gait Ambulation/Gait assistance:  Min assist;Min guard Ambulation Distance (Feet): 125 Feet Assistive device: Rolling walker (2 wheeled) Gait Pattern/deviations: Step-through pattern;Decreased step length - left;Decreased weight shift to left;Drifts right/left;Trunk flexed (LLE drag) Gait velocity: Decreased Gait velocity interpretation: Below normal speed for age/gender General Gait Details: Pt increased gait distance, however, continues to exhibit decreased safety with RW use. Education to pt and family about appropriate use of RW during mobility. Decreased balance, occaisional min A to prevent LOB when pt picking up RW.    Stairs            Wheelchair Mobility    Modified Rankin (Stroke Patients Only) Modified Rankin (Stroke Patients Only) Pre-Morbid Rankin Score: Slight disability Modified Rankin: Moderately severe disability     Balance Overall balance assessment: Needs assistance Sitting-balance support: No upper extremity supported;Feet unsupported Sitting balance-Leahy Scale: Normal     Standing balance support: Bilateral upper extremity supported;During functional activity Standing balance-Leahy Scale: Poor Standing balance comment: Reliant on RW for stability.                             Cognition Arousal/Alertness: Awake/alert Behavior During Therapy: WFL for tasks assessed/performed;Impulsive Overall Cognitive Status: Within Functional Limits for tasks assessed                                        Exercises      General Comments General comments (skin integrity, edema, etc.): Pt's daughter used as interpreter  throughout session. Gave pt's daughter handout about fall prevention at home. Discussed any mobility concerns and daughter stated she had none. Reviewed stair navigation for home and about assist required at home. Pt min guard for toilet transfer during session. Notified NT about void.       Pertinent Vitals/Pain Pain Assessment: Faces Faces Pain Scale:  No hurt    Home Living                      Prior Function            PT Goals (current goals can now be found in the care plan section) Acute Rehab PT Goals Patient Stated Goal: to get home PT Goal Formulation: With patient Time For Goal Achievement: 03/12/17 Potential to Achieve Goals: Good Progress towards PT goals: Progressing toward goals    Frequency    Min 3X/week      PT Plan Current plan remains appropriate    Co-evaluation             End of Session Equipment Utilized During Treatment: Gait belt Activity Tolerance: Patient tolerated treatment well Patient left: in bed;with call bell/phone within reach;with bed alarm set;with family/visitor present Nurse Communication: Mobility status PT Visit Diagnosis: Unsteadiness on feet (R26.81);Muscle weakness (generalized) (M62.81);Repeated falls (R29.6)     Time: 1610-9604 PT Time Calculation (min) (ACUTE ONLY): 15 min  Charges:  $Gait Training: 8-22 mins                    G Codes:       Margot Chimes, PT, DPT  Acute Rehabilitation Services  Pager: [phone]    Melvyn Novas 03/06/2017, 4:08 PM

## 2017-03-06 NOTE — Progress Notes (Signed)
Nutrition Consult/Brief Note  RD consulted for diet education.  Lipid Panel     Component Value Date/Time   CHOL 182 03/05/2017 0451   TRIG 124 03/05/2017 0451   HDL 47 03/05/2017 0451   CHOLHDL 3.9 03/05/2017 0451   VLDL 25 03/05/2017 0451   LDLCALC 110 (H) 03/05/2017 0451   RD provided "Stroke Nutrition Therapy" handout from the Academy of Nutrition and Dietetics.  Body mass index is 26.49 kg/m. Pt meets criteria for Overweight based on current BMI.  Current diet order is Heart Healthy, patient is consuming approximately 100% of meals at this time.   Labs and medications reviewed.  No further nutrition interventions warranted at this time.   If additional nutrition issues arise, please re-consult RD.  Maureen Chatters, RD, LDN Pager #: [phone] After-Hours Pager #: [phone]

## 2017-03-06 NOTE — Progress Notes (Signed)
  Echocardiogram 2D Echocardiogram has been performed.  Tye Savoy 03/06/2017, 9:01 AM

## 2017-03-06 NOTE — Progress Notes (Signed)
STROKE TEAM PROGRESS NOTE   SUBJECTIVE (INTERVAL HISTORY) Daughter at the bedside, acted as Equities trader. Patient sitting up in the bed eating. Family hoping for discharge today. No new complaints.   OBJECTIVE Temp:  [98.1 F (36.7 C)-99 F (37.2 C)] 98.1 F (36.7 C) (04/06 1100) Pulse Rate:  [78-108] 91 (04/06 1100) Cardiac Rhythm: Normal sinus rhythm (04/06 0700) Resp:  [16-20] 20 (04/06 1100) BP: (151-206)/(75-92) 174/82 (04/06 1100) SpO2:  [91 %-97 %] 96 % (04/06 1100)  CBC:   Recent Labs Lab 03/04/17 1252 03/05/17 0451  WBC 5.9 6.0  NEUTROABS 2.8  --   HGB 12.4 11.7*  HCT 39.4 37.6  MCV 80.7 80.5  PLT 240 225   Basic Metabolic Panel:   Recent Labs Lab 03/05/17 0451 03/06/17 0536  NA 139 141  K 3.5 3.3*  CL 104 106  CO2 27 26  GLUCOSE 84 96  BUN 9 11  CREATININE 0.80 0.77  CALCIUM 8.6* 8.8*  MG  --  2.1    PHYSICAL EXAM Pleasant elderly asian lady not in distress. Afebrile. Head is nontraumatic. Neck is supple without bruit.  Cardiac exam no murmur or gallop. Lungs are clear to auscultation. Distal pulses are well felt. Neurological Exam :  Awake alert oriented x 3 normal speech and language. No obvious facial droop today. Tongue midline. Mild LLE drift, 5/5 except for 4+ of hip flexion. Satelliting over LUE. Mild left grip weakness. Normal sensation . Normal coordination. Gait deferred  ASSESSMENT/PLAN [name] is a 83 y.o. female with history of frequent falls presenting with LLE weakness. MRI shows a subacute stroke. She did not receive IV t-PA.   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 atrophy. small vessel disease.   CT CS no fx. Loss of cervical lordosis. B thryoid nodules  MRI  Subacute R PLIC infarct. No hmg. Old BG lacunes. small vessel disease.   MRA  No LVO. Diminished signal L ICA, ? stenosis.  Carotid Doppler  B ICA 1-39% stenosis, VAs antegrade    2D Echo  EF 60-65%. No source of embolus    LDL 110  HgbA1c 5.6  SCDs for VTE prophylaxis Diet Heart Room service appropriate? Yes; Fluid consistency: Thin  No antithrombotic prior to admission, now on aspirin 325 mg daily  Therapy recommendations:  HH PT, RW w/ 3" wheels, 3N1, supervision  Disposition:  Return home (lives w/ dtr)  Follow up with Dr. Pearlean Brownie in 6 weeks, order written  Hypertensive Urgency  BP 222/101 on admission in setting of neurologic symptoms  Improved  On norvasc 2.5 mg daily  Long-term BP goal normotensive  Hyperlipidemia  Home meds:  No statin   LDL 110 goal  Now on zocor 10 mg daily  Continue statin at discharge   Other Stroke Risk Factors  Advanced age  UDS not performed   Hospital day # 1  Rhoderick Moody [facility] See Amion for Pager information 03/06/2017 12:51 PM   I have personally examined this patient, reviewed notes, independently viewed imaging studies, participated in medical decision making and plan of care.ROS completed by me personally and pertinent positives fully documented  I have made any additions or clarifications directly to the above note.  Pt with small vessel subacute stroke, does not have any rehab needs.  Risk factors include HTN, HLD, not previously on Aspirin or statin. Will initiated therapy as  done already (anti-platelet agent and statin). Will plan for follow up in clinic. Stable from neurology standpoint for discharge. Neurology will sign off at this time.  Lillie Fragmin, D.O.  [facility]  To contact Stroke Continuity provider, please refer to [email or web address]. After hours, contact General Neurology

## 2017-03-06 NOTE — Progress Notes (Signed)
Patient discharge instructions reviewed at bedside with patient and her daughter. Patient and daughter verbalize understanding. Patient's daughter is transporting her home. Patient belongings are with patient's daughter. Patient is not in distress.

## 2017-03-06 NOTE — Discharge Summary (Addendum)
Physician Discharge Summary  Ann Coleman ZOX:[id number] DOB: 1924-05-27 DOA: 03/04/2017  PCP: No PCP Per Patient  Admit date: 03/04/2017 Discharge date: 03/06/2017  Time spent: 65 minutes  Recommendations for Outpatient Follow-up:  1. Follow-up with PCP in 1-2 weeks. On follow-up patient's blood pressure will need to be reassessed for better blood pressure management. Risk factor modification 2. Follow-up with Dr. Pearlean Brownie, neurology in 6 weeks.   Discharge Diagnoses:  Principal Problem:   Acute right MCA stroke [facility]) Active Problems:   Hypertensive urgency   Stroke (cerebrum) (HCC)   Stroke [facility])   Chronic microvascular ischemia   Cerebral edema      Discharge Condition: Stable  Diet recommendation: Heart healthy  Filed Weights   03/04/17 1058 03/04/17 2346  Weight: 64.4 kg (142 lb) 59.5 kg (131 lb 2.8 oz)    History of present illness:  Per Dr. Toniann Fail  Ann Coleman is a 83 y.o. female with no significant past medical history who has not been to a physician for many years was experiencing left lower extremity weakness over the last 3 days. Patient also has been having frequent falls. Denied hitting her head or losing consciousness. Had no difficulty in speaking swallowing or visual symptoms or any weakness of other extremities.   ED Course: X-rays of the knee showed small effusion. MRI of the brain showed right internal capsule subacute stroke. Neurologist on-call was consulted and patient was transferred To [facility] for further evaluation. On exam patient still had weakness of the left lower extremity 3/5 in strength. Able to move all extremities. Patient passed swallow. Patient's blood pressure was found to be markedly elevated.   Hospital Course:  #1 subacute infarction involving posterior limb of right internal capsule with edema extending to superior aspect right thalamus Patient admitted for stroke workup. MRI consistent with a subacute infarction involving the  posterior limb of the right internal capsule with edema extending to superior aspect of the right thalamus. MRI also with multiple old infarcts of the basal ganglia as well as chronic microvascular ischemic changes. MRI with diminished signal in the left ICA. MRA with no large vessel occlusion. Patient still with left lower extremity weakness. Head CT negative. LDL of 110. Hemoglobin A1c pending at time of discharge. Carotid Dopplers preliminary readings with no significant ICA stenosis. Neurology was consulted and patient was followed during the hospitalization by the stroke team. Patient was seen by PT/OT who recommended home health therapies as well as rolling walker and 3 and 1 with supervision. Patient was placed on statin with goal LDL of less than 70. Patient was also placed on aspirin for secondary stroke prophylaxis. Permissive hypertension was recommended. Patient was subsequently started on Norvasc 2.5 mg daily to be titrated as needed.  Long-term blood pressure goal normotensive. Patient will follow-up with neurology in the outpatient setting.  #2 hypertensive urgency Patient presented with hypertensive urgency with blood pressure of 222/101 in the setting of neurological symptoms. Patient with subacute infarction. Permissive hypertension during the hospitalization. Will gradually lower blood pressure. Patient was started on Norvasc 2.5 mg daily to be titrated as needed. Patient is to follow-up with PCP in the outpatient setting for further blood pressure management.      Procedures:  CT head and CT C-spine 03/04/2017  Chest x-ray 03/04/2017  Plain films of bilateral knees 03/04/2017  MRI MRA head 03/04/2017, 03/05/2017  Carotid Dopplers 03/05/2017  2-D echo 03/06/2017   Consultations:  Neurology: Dr.Lindzen 03/04/2017  Discharge Exam: Vitals:  03/06/17 0539 03/06/17 1100  BP: (!) 181/86 (!) 174/82  Pulse: 78 91  Resp: 16 20  Temp: 98.4 F (36.9 C) 98.1 F (36.7 C)     General: NAD Cardiovascular: RRR Respiratory: CTAB  Discharge Instructions   Discharge Instructions    Ambulatory referral to Neurology    Complete by:  As directed    Stroke patient. Dr. Pearlean Brownie prefers follow up in 6 weeks   Diet - low sodium heart healthy    Complete by:  As directed    Increase activity slowly    Complete by:  As directed      Current Discharge Medication List    START taking these medications   Details  amLODipine (NORVASC) 5 MG tablet Take 1 tablet (5 mg total) by mouth daily. Qty: 30 tablet, Refills: 2    aspirin 325 MG tablet Take 1 tablet (325 mg total) by mouth daily.    hydrochlorothiazide (MICROZIDE) 12.5 MG capsule Take 1 capsule (12.5 mg total) by mouth daily. Qty: 30 capsule, Refills: 0    simvastatin (ZOCOR) 10 MG tablet Take 1 tablet (10 mg total) by mouth daily at 6 PM. Qty: 30 tablet, Refills: 0      CONTINUE these medications which have NOT CHANGED   Details  acetaminophen (TYLENOL) 500 MG tablet Take 500 mg by mouth every 6 (six) hours as needed for moderate pain.      STOP taking these medications     azithromycin (ZITHROMAX) 250 MG tablet      HYDROcodone-homatropine (HYCODAN) 5-1.5 MG/5ML syrup      predniSONE (DELTASONE) 20 MG tablet        No Known Allergies Follow-up Information    [facility] Follow up.   Specialty:  Home Health Services Why:  For home health services after DC Contact information: [address] [place] [address] [phone]        SETHI,PRAMOD, MD Follow up in 6 week(s).   Specialties:  Neurology, Radiology Why:  stroke clinic. office will call with appt date and time. Contact information: [address] [place] [address] [phone]        PCP Follow up.   Why:  F/U with PCP in 1-2 weeks           The results of significant diagnostics from this hospitalization (including imaging, microbiology, ancillary and  laboratory) are listed below for reference.    Significant Diagnostic Studies: Dg Chest 2 View  Result Date: 03/04/2017 CLINICAL DATA:  Palpitations.  Weakness and falls EXAM: CHEST  2 VIEW COMPARISON:  12/16/2013 FINDINGS: Chronic cardiomegaly and aortic tortuosity. Chronic widening of the upper mediastinum, usually from ectatic great vessels or thyroid enlargement, stable. There is no edema, consolidation, effusion, or pneumothorax. No acute osseous finding. IMPRESSION: 1. No acute finding when compared to prior. 2. Chronic cardiomegaly. Electronically Signed   By: Marnee Spring M.D.   On: 03/04/2017 12:53   Ct Head Wo Contrast  Result Date: 03/04/2017 CLINICAL DATA:  Patient fell in the bathroom. EXAM: CT HEAD WITHOUT CONTRAST CT CERVICAL SPINE WITHOUT CONTRAST TECHNIQUE: Multidetector CT imaging of the head and cervical spine was performed following the standard protocol without intravenous contrast. Multiplanar CT image reconstructions of the cervical spine were also generated. COMPARISON:  None. FINDINGS: CT HEAD FINDINGS Brain: There is no evidence for acute hemorrhage, hydrocephalus, mass lesion, or abnormal extra-axial fluid collection. No definite CT evidence for acute infarction. Diffuse loss of parenchymal  volume is consistent with atrophy. Patchy low attenuation in the deep hemispheric and periventricular white matter is nonspecific, but likely reflects chronic microvascular ischemic demyelination. Lacunar infarct noted left basal ganglia. Vascular: No hyperdense vessel or unexpected calcification. Skull: No evidence for fracture. No worrisome lytic or sclerotic lesion. Sinuses/Orbits: The visualized paranasal sinuses and mastoid air cells are clear. Visualized portions of the globes and intraorbital fat are unremarkable. Other: None. CT CERVICAL SPINE FINDINGS Alignment: No subluxation. Mild straightening of normal cervical lordosis is evident. Facets are well aligned bilaterally. Skull base  and vertebrae: No fracture. Soft tissues and spinal canal: 2.7 cm heterogeneous right thyroid nodule. 1.9 cm rim calcified left thyroid nodule. No prevertebral soft tissue edema. Disc levels:  Relatively preserved throughout. Upper chest:  Biapical pleural-parenchymal scarring. Other: None. IMPRESSION: 1. No acute intracranial abnormality. 2. Atrophy with chronic small vessel white matter ischemic disease. 3. No cervical spine fracture. 4. Loss of cervical lordosis. This can be related to patient positioning, muscle spasm or soft tissue injury. 5. Bilateral thyroid nodules measuring up to 2.7 cm diameter. Thyroid ultrasound recommended to further evaluate. Electronically Signed   By: Kennith Center M.D.   On: 03/04/2017 13:43   Ct Cervical Spine Wo Contrast  Result Date: 03/04/2017 CLINICAL DATA:  Patient fell in the bathroom. EXAM: CT HEAD WITHOUT CONTRAST CT CERVICAL SPINE WITHOUT CONTRAST TECHNIQUE: Multidetector CT imaging of the head and cervical spine was performed following the standard protocol without intravenous contrast. Multiplanar CT image reconstructions of the cervical spine were also generated. COMPARISON:  None. FINDINGS: CT HEAD FINDINGS Brain: There is no evidence for acute hemorrhage, hydrocephalus, mass lesion, or abnormal extra-axial fluid collection. No definite CT evidence for acute infarction. Diffuse loss of parenchymal volume is consistent with atrophy. Patchy low attenuation in the deep hemispheric and periventricular white matter is nonspecific, but likely reflects chronic microvascular ischemic demyelination. Lacunar infarct noted left basal ganglia. Vascular: No hyperdense vessel or unexpected calcification. Skull: No evidence for fracture. No worrisome lytic or sclerotic lesion. Sinuses/Orbits: The visualized paranasal sinuses and mastoid air cells are clear. Visualized portions of the globes and intraorbital fat are unremarkable. Other: None. CT CERVICAL SPINE FINDINGS Alignment:  No subluxation. Mild straightening of normal cervical lordosis is evident. Facets are well aligned bilaterally. Skull base and vertebrae: No fracture. Soft tissues and spinal canal: 2.7 cm heterogeneous right thyroid nodule. 1.9 cm rim calcified left thyroid nodule. No prevertebral soft tissue edema. Disc levels:  Relatively preserved throughout. Upper chest:  Biapical pleural-parenchymal scarring. Other: None. IMPRESSION: 1. No acute intracranial abnormality. 2. Atrophy with chronic small vessel white matter ischemic disease. 3. No cervical spine fracture. 4. Loss of cervical lordosis. This can be related to patient positioning, muscle spasm or soft tissue injury. 5. Bilateral thyroid nodules measuring up to 2.7 cm diameter. Thyroid ultrasound recommended to further evaluate. Electronically Signed   By: Kennith Center M.D.   On: 03/04/2017 13:43   Mr Brain Wo Contrast  Result Date: 03/04/2017 CLINICAL DATA:  Left leg weakness.  Multiple recent falls. EXAM: MRI HEAD WITHOUT CONTRAST TECHNIQUE: Multiplanar, multiecho pulse sequences of the brain and surrounding structures were obtained without intravenous contrast. COMPARISON:  Head CT 03/04/2017 FINDINGS: Despite efforts by the technologist and patient, motion artifact is present on today's examination and could not be eliminated. The findings of this study are interpreted in the context of that limitation. Brain: There is high signal on diffusion-weighted imaging along the course of the posterior limb of the right  internal capsule without diffusion restriction on the ADC map. There is an area of hyperintense T2 weighted signal surrounding this lesion extending into the superior aspect of the right thalamus. No acute intraparenchymal hematoma. There are multiple old basal ganglia lacunar infarcts. There is beginning confluent hyperintense T2-weighted signal within the periventricular white matter, most often seen in the setting of chronic microvascular ischemia.  Vascular: Major intracranial arterial and venous sinus flow voids are preserved. No evidence of chronic microhemorrhage or amyloid angiopathy. Skull and upper cervical spine: The visualized skull base, calvarium, upper cervical spine and extracranial soft tissues are normal. Sinuses/Orbits: No fluid levels or advanced mucosal thickening. No mastoid effusion. Normal orbits. IMPRESSION: 1. Likely subacute ischemia involving the posterior limb of the right internal capsule with edema extending into the superior aspect of the right thalamus. This is in keeping with reported left-sided weakness. 2. No hemorrhage or significant mass effect. 3. Multiple old lacunar infarcts of the basal ganglia and findings of chronic microvascular ischemia. Electronically Signed   Deatra Robinson Herman M.D.   On: 03/04/2017 19:19   Dg Knee Complete 4 Views Left  Result Date: 03/04/2017 CLINICAL DATA:  Chronic left knee pain, aggravated by a fall this morning. EXAM: LEFT KNEE - COMPLETE 4+ VIEW COMPARISON:  None. FINDINGS: The patient has severe tricompartmental osteoarthritis. There is no fracture or dislocation or appreciable joint effusion. Loose bodies in the joint. IMPRESSION: No acute abnormalities.  Severe tricompartmental osteoarthritis. Electronically Signed   By: Francene Boyers M.D.   On: 03/04/2017 11:54   Dg Knee Complete 4 Views Right  Result Date: 03/04/2017 CLINICAL DATA:  Left knee pain and weakness, fall, bilateral knee pain EXAM: RIGHT KNEE - COMPLETE 4+ VIEW COMPARISON:  None. FINDINGS: Four views of the right knee submitted. There is diffuse narrowing of joint space. Mild spurring medial and lateral tibial plateau. There is spurring of lateral femoral condyle. Significant narrowing of patellofemoral joint space. Spurring of patella. Small joint effusion. Diffuse osteopenia. No acute fracture or subluxation. IMPRESSION: No acute fracture or subluxation. Osteoarthritic changes as described above. Diffuse osteopenia.  Small joint effusion. Electronically Signed   By: Natasha Mead M.D.   On: 03/04/2017 11:54   Mr Maxine Glenn Head/brain JX Cm  Result Date: 03/05/2017 CLINICAL DATA:  83 y/o  F; evaluation for stroke. EXAM: MRA HEAD WITHOUT CONTRAST TECHNIQUE: Angiographic images of the Circle of Willis were obtained using MRA technique without intravenous contrast. COMPARISON:  03/04/2017 MRI of the head. FINDINGS: Motion artifact, suboptimal evaluation for subtle stenosis or aneurysm. Internal carotid arteries: Patent right internal carotid artery without significant stenosis. Patent left internal carotid artery with diminished flow related signal of the distal cavernous to terminal segments indicating probable underlying moderate to severe stenosis. Anterior cerebral arteries: Patent. Question stenosis of left A1 origin. Middle cerebral arteries: Patent. Diminished flow related signal in left MCA distribution in comparison with the right is likely due to stenosis in the left ICA. Anterior communicating artery: Patent. Posterior communicating arteries: Not identified, likely hypoplastic or absent. Posterior cerebral arteries:  Patent. Basilar artery:  Patent. Vertebral arteries:  Patent. IMPRESSION: 1. Moderate motion artifact, suboptimal evaluation for subtle stenosis or aneurysm. 2. Patent circle of Willis.  No large vessel occlusion. 3. Diminished signal within the left internal carotid artery from distal cavernous to terminal segment indicating probable underlying moderate to severe stenosis. 4. Asymmetric reduced signal in left MCA distribution likely due to upstream ICA stenosis. Electronically Signed   By: Buzzy Han.D.  On: 03/05/2017 03:39    Microbiology: Recent Results (from the past 240 hour(s))  Urine culture     Status: Abnormal   Collection Time: 03/04/17 12:30 PM  Result Value Ref Range Status   Specimen Description URINE, RANDOM  Final   Special Requests NONE  Final   Culture (A)  Final     <10,000 COLONIES/mL INSIGNIFICANT GROWTH Performed at [facility], [address], [place], [place] [address]    Report Status 03/05/2017 FINAL  Final     Labs: Basic Metabolic Panel:  Recent Labs Lab 03/04/17 1252 03/05/17 0451 03/06/17 0536  NA 141 139 141  K 3.2* 3.5 3.3*  CL 103 104 106  CO2 31 27 26   GLUCOSE 93 84 96  BUN 9 9 11   CREATININE 0.86 0.80 0.77  CALCIUM 8.7* 8.6* 8.8*  MG  --   --  2.1   Liver Function Tests:  Recent Labs Lab 03/04/17 1252 03/05/17 0451  AST 19 23  ALT 13* 14  ALKPHOS 79 76  BILITOT 0.8 0.9  PROT 7.7 6.8  ALBUMIN 3.5 3.0*   No results for input(s): LIPASE, AMYLASE in the last 168 hours. No results for input(s): AMMONIA in the last 168 hours. CBC:  Recent Labs Lab 03/04/17 1252 03/05/17 0451  WBC 5.9 6.0  NEUTROABS 2.8  --   HGB 12.4 11.7*  HCT 39.4 37.6  MCV 80.7 80.5  PLT 240 225   Cardiac Enzymes: No results for input(s): CKTOTAL, CKMB, CKMBINDEX, TROPONINI in the last 168 hours. BNP: BNP (last 3 results) No results for input(s): BNP in the last 8760 hours.  ProBNP (last 3 results) No results for input(s): PROBNP in the last 8760 hours.  CBG: No results for input(s): GLUCAP in the last 168 hours.     SignedRamiro Harvest MD.  [facility] 03/06/2017, 3:04 PM

## 2017-03-16 DIAGNOSIS — E782 Mixed hyperlipidemia: Secondary | ICD-10-CM | POA: Diagnosis not present

## 2017-03-16 DIAGNOSIS — I639 Cerebral infarction, unspecified: Secondary | ICD-10-CM | POA: Diagnosis not present

## 2017-03-16 DIAGNOSIS — Z0000 Encounter for general adult medical examination without abnormal findings: Secondary | ICD-10-CM | POA: Diagnosis not present

## 2017-03-16 DIAGNOSIS — I10 Essential (primary) hypertension: Secondary | ICD-10-CM | POA: Diagnosis not present

## 2017-03-16 DIAGNOSIS — R269 Unspecified abnormalities of gait and mobility: Secondary | ICD-10-CM | POA: Diagnosis not present

## 2017-04-23 ENCOUNTER — Ambulatory Visit: Payer: Medicare Other | Admitting: Neurology

## 2017-04-23 ENCOUNTER — Telehealth: Payer: Self-pay

## 2017-04-23 ENCOUNTER — Encounter (INDEPENDENT_AMBULATORY_CARE_PROVIDER_SITE_OTHER): Payer: Self-pay

## 2017-04-23 NOTE — Telephone Encounter (Signed)
Pt no-showed her new pt appt this morning. 

## 2017-04-23 NOTE — Telephone Encounter (Signed)
Appt was r/s'd to 05/08/17.

## 2017-04-27 DIAGNOSIS — R269 Unspecified abnormalities of gait and mobility: Secondary | ICD-10-CM | POA: Diagnosis not present

## 2017-04-27 DIAGNOSIS — E782 Mixed hyperlipidemia: Secondary | ICD-10-CM | POA: Diagnosis not present

## 2017-04-27 DIAGNOSIS — I10 Essential (primary) hypertension: Secondary | ICD-10-CM | POA: Diagnosis not present

## 2017-04-27 DIAGNOSIS — I639 Cerebral infarction, unspecified: Secondary | ICD-10-CM | POA: Diagnosis not present

## 2017-05-08 ENCOUNTER — Ambulatory Visit (INDEPENDENT_AMBULATORY_CARE_PROVIDER_SITE_OTHER): Payer: Medicare Other | Admitting: Neurology

## 2017-05-08 ENCOUNTER — Encounter: Payer: Self-pay | Admitting: Neurology

## 2017-05-08 VITALS — Ht 59.0 in | Wt 141.8 lb

## 2017-05-08 DIAGNOSIS — I6381 Other cerebral infarction due to occlusion or stenosis of small artery: Secondary | ICD-10-CM

## 2017-05-08 DIAGNOSIS — I639 Cerebral infarction, unspecified: Secondary | ICD-10-CM

## 2017-05-08 NOTE — Patient Instructions (Signed)
Remember to drink plenty of fluid, eat healthy meals and do not skip any meals. Try to eat protein with a every meal and eat a healthy snack such as fruit or nuts in between meals. Try to keep a regular sleep-wake schedule and try to exercise daily, particularly in the form of walking, 20-30 minutes a day, if you can.   As far as your medications are concerned, I would like to suggest: Continue medications  I would like to see you back as needed, sooner if we need to. Please call us with any interim questions, concerns, problems, updates or refill requests.    Our phone number is [phone]. We also have an after hours call service for urgent matters and there is a physician on-call for urgent questions. For any emergencies you know to call 911 or go to the nearest emergency room   Ischemic Stroke An ischemic stroke is the sudden death of brain tissue. Blood carries oxygen to all areas of the body. This type of stroke happens when your blood does not flow to your brain like normal. Your brain cannot get the oxygen it needs. This is an emergency. It must be treated right away. Symptoms of a stroke usually happen all of a sudden. You may notice them when you wake up. They can include:  Weakness or loss of feeling in your face, arm, or leg. This often happens on one side of the body.  Trouble walking.  Trouble moving your arms or legs.  Loss of balance or coordination.  Feeling confused.  Trouble talking or understanding what people are saying.  Slurred speech.  Trouble seeing.  Seeing two of one object (double vision).  Feeling dizzy.  Feeling sick to your stomach (nauseous) and throwing up (vomiting).  A very bad headache for no reason.  Get help as soon as any of these problems start. This is important. Some treatments work better if they are given right away. These include:  Aspirin.  Medicines to control blood pressure.  A shot (injection) of medicine to break up the  blood clot.  Treatments given in the blood vessel (artery) to take out the clot or break it up.  Other treatments may include:  Oxygen.  Fluids given through an IV tube.  Medicines to thin out your blood.  Procedures to help your blood flow better.  What increases the risk? Certain things may make you more likely to have a stroke. Some of these are things that you can change, such as:  Being very overweight (obesity).  Smoking.  Taking birth control pills.  Not being active.  Drinking too Jeanelle alcohol.  Using drugs.  Other risk factors include:  High blood pressure.  High cholesterol.  Diabetes.  Heart disease.  Being [place] American, Native [address], Hispanic, or [place] Native.  Being over age 20.  Family history of stroke.  Having had blood clots, stroke, or warning stroke (transient ischemic attack, TIA) in the past.  Sickle cell disease.  Being a woman with a history of high blood pressure in pregnancy (preeclampsia).  Migraine headache.  Sleep apnea.  Having an irregular heartbeat (atrial fibrillation).  Long-term (chronic) diseases that cause soreness and swelling (inflammation).  Disorders that affect how your blood clots.  Follow these instructions at home: Medicines  Take over-the-counter and prescription medicines only as told by your doctor.  If you were told to take aspirin or another medicine to thin your blood, take it exactly as told by your doctor. ?  Taking too Terrianne of the medicine can cause bleeding. ? If you do not take enough, it may not work as well.  Know the side effects of your medicines. If you are taking a blood thinner, make sure you: ? Hold pressure over any cuts for longer than usual. ? Tell your dentist and other doctors that you take this medicine. ? Avoid activities that may cause damage or injury to your body. Eating and drinking  Follow instructions from your doctor about what you cannot eat or drink.  Eat  healthy foods.  If you have trouble with swallowing, do these things to avoid choking: ? Take small bites when eating. ? Eat foods that are soft or pureed. Safety  Follow instructions from your health care team about physical activity.  Use a walker or cane as told by your doctor.  Keep your home safe so you do not fall. This may include: ? Having experts look at your home to make sure it is safe. ? Putting grab bars in the bedroom and bathroom. ? Using raised toilets. ? Putting a seat in the shower. General instructions  Do not use any tobacco products. ? Examples of these are cigarettes, chewing tobacco, and e-cigarettes. ? If you need help quitting, ask your doctor.  Limit how Treniya alcohol you drink. This means no more than 1 drink a day for nonpregnant women and 2 drinks a day for men. One drink equals 12 oz of beer, 5 oz of wine, or 1 oz of hard liquor.  If you need help to stop using drugs or alcohol, ask your doctor to refer you to a program or specialist.  Stay active. Exercise as told by your doctor.  Keep all follow-up visits as told by your doctor. This is important. Get help right away if:  You suddenly: ? Have weakness or loss of feeling in your face, arm, or leg. ? Feel confused. ? Have trouble talking or understanding what people are saying. ? Have trouble seeing. ? Have trouble walking. ? Have trouble moving your arms or legs. ? Feel dizzy. ? Lose your balance or coordination. ? Have a very bad headache and you do not know why.  You pass out (lose consciousness) or almost pass out.  You have jerky movements that you cannot control (seizure). These symptoms may be an emergency. Do not wait to see if the symptoms will go away. Get medical help right away. Call your local emergency services (911 in the [place] Do not drive yourself to the hospital. This information is not intended to replace advice given to you by your health care provider. Make sure you  discuss any questions you have with your health care provider. Document Released: 11/06/2011 Document Revised: 04/29/2016 Document Reviewed: 02/13/2016 Elsevier Interactive Patient Education  [employer]2018 [employer]

## 2017-05-08 NOTE — Progress Notes (Signed)
[facility]    Provider:  Dr Lucia Gaskins Referring Provider: Layne Benton, NP Primary Care Physician:  Patient, No Pcp Per  CC:  stroke  HPI:  Keyosha Noda is a 83 y.o. female here as a referral from Dr. Catha Gosselin for stroke. Per a review of records, patient fell in the bathroom and April to the left leg pain and left leg weakness. Patient was not on aspirin at home. MRI at yield a ischemic infarction involving the posterior limb of the right internal capsule. Left lower extremity was weak. She also had old basal ganglia infarcts and small vessel disease. LDL was 110. Blood pressure was significantly elevated to 222/101. She was started on a statin for her cholesterol.  Here with daughter who provides all information. Before the stroke no difficulties walking. Now her leg is weak but improving. She is walking with a walker. They decline PT. No sensory changes. Just weakness no falls. Appetite is good. Mood is good. Memory is excellent. No difficulties with medication. Compliant with medications. She has people come in and check every month, BP been good, no dizziness or falls. No mood problems or depression. No other focal neurologic deficits, associated symptoms, inciting events or modifiable factors.  Reviewed notes, labs and imaging from outside physicians, which showed:  Personally reviewed imaging and agree with the following. Also reviewed imaging with patient and daughter.  MRI 03/04/2917: 1. Likely subacute ischemia involving the posterior limb of the right internal capsule with edema extending into the superior aspect of the right thalamus. This is in keeping with reported left-sided weakness. 2. No hemorrhage or significant mass effect. 3. Multiple old lacunar infarcts of the basal ganglia and findings of chronic microvascular ischemia.  ldl 110  Review of Systems: Patient complains of symptoms per HPI as well as the following symptoms: no CP, no SOB. Pertinent negatives and  positives per HPI. All others negative.   Social History   Social History  . Marital status: Divorced    Spouse name: N/A  . Number of children: N/A  . Years of education: N/A   Occupational History  . Not on file.   Social History Main Topics  . Smoking status: Never Smoker  . Smokeless tobacco: Never Used  . Alcohol use No  . Drug use: No  . Sexual activity: No   Other Topics Concern  . Not on file   Social History Narrative   Lives w/ daughter   Right-handed   No caffeine       Family History  Problem Relation Age of Onset  . Stroke Neg Hx   . Diabetes Mellitus II Neg Hx   . Hypertension Neg Hx     Past Medical History:  Diagnosis Date  . High cholesterol   . Hypertension   . Stroke [facility])     Past Surgical History:  Procedure Laterality Date  . NO PAST SURGERIES      Current Outpatient Prescriptions  Medication Sig Dispense Refill  . acetaminophen (TYLENOL) 500 MG tablet Take 500 mg by mouth every 6 (six) hours as needed for moderate pain.    Marland Kitchen amLODipine (NORVASC) 5 MG tablet Take 1 tablet (5 mg total) by mouth daily. 30 tablet 2  . aspirin 325 MG tablet Take 1 tablet (325 mg total) by mouth daily.    . hydrochlorothiazide (MICROZIDE) 12.5 MG capsule Take 1 capsule (12.5 mg total) by mouth daily. 30 capsule 0  . simvastatin (ZOCOR) 10 MG tablet Take 1  tablet (10 mg total) by mouth daily at 6 PM. 30 tablet 0   No current facility-administered medications for this visit.     Allergies as of 05/08/2017  . (No Known Allergies)    Vitals: Ht 4\' 11"  (1.499 m)   Wt 141 lb 12.8 oz (64.3 kg)   BMI 28.64 kg/m  Last Weight:  Wt Readings from Last 1 Encounters:  05/08/17 141 lb 12.8 oz (64.3 kg)   Last Height:   Ht Readings from Last 1 Encounters:  05/08/17 4\' 11"  (1.499 m)    Physical exam: Exam: Gen: NAD, conversant, well nourised, overweight, well groomed                     CV: RRR, no MRG. No Carotid Bruits. No peripheral edema, warm,  nontender Eyes: Conjunctivae clear without exudates or hemorrhage  Neuro: Detailed Neurologic Exam  Speech:    Speech is normal; fluent and spontaneous with normal comprehension.  Cognition:    The patient is oriented to person, place, and time;     recent and remote memory intact;     language fluent;     normal attention, concentration,     fund of knowledge Cranial Nerves:    The pupils are equal, round, and reactive to light. Attempted findoscopic exam could not visualize due to small pupil. Visual fields are full to finger confrontation. Extraocular movements are intact. Trigeminal sensation is intact and the muscles of mastication are normal. The face is symmetric. The palate elevates in the midline. Hearing intact. Voice is normal. Shoulder shrug is normal. The tongue has normal motion without fasciculations.   Coordination:    No dysmetria  Gait:    Using a walker, good stride, no ataxia or imbalance  Motor Observation:    No asymmetry, no atrophy, and no involuntary movements noted. Tone:    Normal muscle tone.    Posture:    Posture is normal. normal erect    Strength: Left LE 4/5 otherwise strength is V/V in the upper and lower limbs.      Sensation: intact to LT     Reflex Exam:  DTR's:    Deep tendon reflexes in the upper and lower extremities are symmetrical bilaterally.   Toes:    The toes are equivocal bilaterally.   Clonus:    Clonus is absent.       Assessment/Plan:  83 year old with R posterior limb internal capsule infarct due to small vessel disease. Improving LLE weakness.   I had a long d/w patient and daughter about her recent stroke, risk for recurrent stroke/TIAs, personally independently reviewed imaging studies and stroke evaluation results and answered questions.Continue ASA for secondary stroke prevention and maintain strict control of hypertension with blood pressure goal below 130/90, diabetes with hemoglobin A1c goal below 6.5% and  lipids with LDL cholesterol goal below 70 mg/dL. I also advised the patient to eat a healthy diet with plenty of whole grains, cereals, fruits and vegetables, exercise regularly and maintain ideal body weight .Followup in the future with me if needed.   Naomie DeanAntonia Ahern, MD  [facility] [address] [place], [place] [id number]  Phone [phone] Fax [phone]

## 2017-05-10 DIAGNOSIS — I6381 Other cerebral infarction due to occlusion or stenosis of small artery: Secondary | ICD-10-CM | POA: Insufficient documentation

## 2020-05-29 ENCOUNTER — Inpatient Hospital Stay (HOSPITAL_COMMUNITY)
Admission: EM | Admit: 2020-05-29 | Discharge: 2020-06-02 | DRG: 065 | Disposition: A | Discharge status: Rehabilitation Facility | Payer: Medicare Other | Attending: Internal Medicine | Admitting: Internal Medicine

## 2020-05-29 ENCOUNTER — Emergency Department (HOSPITAL_COMMUNITY): Payer: Medicare Other

## 2020-05-29 ENCOUNTER — Encounter (HOSPITAL_COMMUNITY): Payer: Self-pay

## 2020-05-29 DIAGNOSIS — E669 Obesity, unspecified: Secondary | ICD-10-CM | POA: Diagnosis present

## 2020-05-29 DIAGNOSIS — R062 Wheezing: Secondary | ICD-10-CM | POA: Diagnosis not present

## 2020-05-29 DIAGNOSIS — E785 Hyperlipidemia, unspecified: Secondary | ICD-10-CM | POA: Diagnosis present

## 2020-05-29 DIAGNOSIS — R0789 Other chest pain: Secondary | ICD-10-CM | POA: Diagnosis not present

## 2020-05-29 DIAGNOSIS — G8191 Hemiplegia, unspecified affecting right dominant side: Secondary | ICD-10-CM | POA: Diagnosis present

## 2020-05-29 DIAGNOSIS — I69391 Dysphagia following cerebral infarction: Secondary | ICD-10-CM | POA: Diagnosis not present

## 2020-05-29 DIAGNOSIS — I6389 Other cerebral infarction: Principal | ICD-10-CM | POA: Diagnosis present

## 2020-05-29 DIAGNOSIS — I351 Nonrheumatic aortic (valve) insufficiency: Secondary | ICD-10-CM | POA: Diagnosis not present

## 2020-05-29 DIAGNOSIS — I739 Peripheral vascular disease, unspecified: Secondary | ICD-10-CM | POA: Diagnosis present

## 2020-05-29 DIAGNOSIS — I639 Cerebral infarction, unspecified: Secondary | ICD-10-CM | POA: Diagnosis present

## 2020-05-29 DIAGNOSIS — M8448XA Pathological fracture, other site, initial encounter for fracture: Secondary | ICD-10-CM | POA: Diagnosis present

## 2020-05-29 DIAGNOSIS — I10 Essential (primary) hypertension: Secondary | ICD-10-CM | POA: Diagnosis present

## 2020-05-29 DIAGNOSIS — Z9114 Patient's other noncompliance with medication regimen: Secondary | ICD-10-CM | POA: Diagnosis not present

## 2020-05-29 DIAGNOSIS — I672 Cerebral atherosclerosis: Secondary | ICD-10-CM | POA: Diagnosis present

## 2020-05-29 DIAGNOSIS — R1311 Dysphagia, oral phase: Secondary | ICD-10-CM | POA: Diagnosis not present

## 2020-05-29 DIAGNOSIS — Z8673 Personal history of transient ischemic attack (TIA), and cerebral infarction without residual deficits: Secondary | ICD-10-CM | POA: Diagnosis not present

## 2020-05-29 DIAGNOSIS — I160 Hypertensive urgency: Secondary | ICD-10-CM | POA: Diagnosis present

## 2020-05-29 DIAGNOSIS — Z79899 Other long term (current) drug therapy: Secondary | ICD-10-CM

## 2020-05-29 DIAGNOSIS — Z7982 Long term (current) use of aspirin: Secondary | ICD-10-CM | POA: Diagnosis not present

## 2020-05-29 DIAGNOSIS — R29711 NIHSS score 11: Secondary | ICD-10-CM | POA: Diagnosis present

## 2020-05-29 DIAGNOSIS — I700 Atherosclerosis of aorta: Secondary | ICD-10-CM | POA: Diagnosis present

## 2020-05-29 DIAGNOSIS — R1310 Dysphagia, unspecified: Secondary | ICD-10-CM | POA: Diagnosis present

## 2020-05-29 DIAGNOSIS — R030 Elevated blood-pressure reading, without diagnosis of hypertension: Secondary | ICD-10-CM | POA: Diagnosis not present

## 2020-05-29 DIAGNOSIS — M25561 Pain in right knee: Secondary | ICD-10-CM | POA: Diagnosis not present

## 2020-05-29 DIAGNOSIS — I69354 Hemiplegia and hemiparesis following cerebral infarction affecting left non-dominant side: Secondary | ICD-10-CM | POA: Diagnosis not present

## 2020-05-29 DIAGNOSIS — I69322 Dysarthria following cerebral infarction: Secondary | ICD-10-CM | POA: Diagnosis not present

## 2020-05-29 DIAGNOSIS — R0600 Dyspnea, unspecified: Secondary | ICD-10-CM | POA: Diagnosis not present

## 2020-05-29 DIAGNOSIS — Z66 Do not resuscitate: Secondary | ICD-10-CM | POA: Diagnosis present

## 2020-05-29 DIAGNOSIS — E7800 Pure hypercholesterolemia, unspecified: Secondary | ICD-10-CM | POA: Diagnosis present

## 2020-05-29 DIAGNOSIS — X58XXXA Exposure to other specified factors, initial encounter: Secondary | ICD-10-CM | POA: Diagnosis present

## 2020-05-29 DIAGNOSIS — Z6830 Body mass index (BMI) 30.0-30.9, adult: Secondary | ICD-10-CM | POA: Diagnosis not present

## 2020-05-29 DIAGNOSIS — I69328 Other speech and language deficits following cerebral infarction: Secondary | ICD-10-CM | POA: Diagnosis not present

## 2020-05-29 DIAGNOSIS — I6350 Cerebral infarction due to unspecified occlusion or stenosis of unspecified cerebral artery: Secondary | ICD-10-CM | POA: Diagnosis not present

## 2020-05-29 DIAGNOSIS — R7303 Prediabetes: Secondary | ICD-10-CM | POA: Diagnosis not present

## 2020-05-29 DIAGNOSIS — R0989 Other specified symptoms and signs involving the circulatory and respiratory systems: Secondary | ICD-10-CM | POA: Diagnosis not present

## 2020-05-29 DIAGNOSIS — Z20822 Contact with and (suspected) exposure to covid-19: Secondary | ICD-10-CM | POA: Diagnosis present

## 2020-05-29 DIAGNOSIS — G8929 Other chronic pain: Secondary | ICD-10-CM | POA: Diagnosis not present

## 2020-05-29 DIAGNOSIS — R0601 Orthopnea: Secondary | ICD-10-CM | POA: Diagnosis not present

## 2020-05-29 DIAGNOSIS — R4781 Slurred speech: Secondary | ICD-10-CM | POA: Diagnosis present

## 2020-05-29 DIAGNOSIS — I129 Hypertensive chronic kidney disease with stage 1 through stage 4 chronic kidney disease, or unspecified chronic kidney disease: Secondary | ICD-10-CM | POA: Diagnosis not present

## 2020-05-29 DIAGNOSIS — E781 Pure hyperglyceridemia: Secondary | ICD-10-CM | POA: Diagnosis not present

## 2020-05-29 DIAGNOSIS — I5189 Other ill-defined heart diseases: Secondary | ICD-10-CM | POA: Diagnosis not present

## 2020-05-29 DIAGNOSIS — M25562 Pain in left knee: Secondary | ICD-10-CM | POA: Diagnosis not present

## 2020-05-29 DIAGNOSIS — N182 Chronic kidney disease, stage 2 (mild): Secondary | ICD-10-CM | POA: Diagnosis not present

## 2020-05-29 DIAGNOSIS — N179 Acute kidney failure, unspecified: Secondary | ICD-10-CM | POA: Diagnosis not present

## 2020-05-29 LAB — I-STAT CHEM 8, ED
BUN: 11 mg/dL (ref 8–23)
Calcium, Ion: 1.1 mmol/L — ABNORMAL LOW (ref 1.15–1.40)
Chloride: 101 mmol/L (ref 98–111)
Creatinine, Ser: 0.9 mg/dL (ref 0.44–1.00)
Glucose, Bld: 95 mg/dL (ref 70–99)
HCT: 39 % (ref 36.0–46.0)
Hemoglobin: 13.3 g/dL (ref 12.0–15.0)
Potassium: 3.5 mmol/L (ref 3.5–5.1)
Sodium: 140 mmol/L (ref 135–145)
TCO2: 26 mmol/L (ref 22–32)

## 2020-05-29 LAB — DIFFERENTIAL
Abs Immature Granulocytes: 0.02 10*3/uL (ref 0.00–0.07)
Basophils Absolute: 0.1 10*3/uL (ref 0.0–0.1)
Basophils Relative: 1 %
Eosinophils Absolute: 0.1 10*3/uL (ref 0.0–0.5)
Eosinophils Relative: 2 %
Immature Granulocytes: 0 %
Lymphocytes Relative: 38 %
Lymphs Abs: 2.6 10*3/uL (ref 0.7–4.0)
Monocytes Absolute: 1.1 10*3/uL — ABNORMAL HIGH (ref 0.1–1.0)
Monocytes Relative: 16 %
Neutro Abs: 3 10*3/uL (ref 1.7–7.7)
Neutrophils Relative %: 43 %

## 2020-05-29 LAB — CBC
HCT: 40.8 % (ref 36.0–46.0)
Hemoglobin: 12.6 g/dL (ref 12.0–15.0)
MCH: 25.9 pg — ABNORMAL LOW (ref 26.0–34.0)
MCHC: 30.9 g/dL (ref 30.0–36.0)
MCV: 83.8 fL (ref 80.0–100.0)
Platelets: 212 10*3/uL (ref 150–400)
RBC: 4.87 MIL/uL (ref 3.87–5.11)
RDW: 13.3 % (ref 11.5–15.5)
WBC: 7 10*3/uL (ref 4.0–10.5)
nRBC: 0 % (ref 0.0–0.2)

## 2020-05-29 LAB — COMPREHENSIVE METABOLIC PANEL
ALT: 27 U/L (ref 0–44)
AST: 35 U/L (ref 15–41)
Albumin: 3.2 g/dL — ABNORMAL LOW (ref 3.5–5.0)
Alkaline Phosphatase: 72 U/L (ref 38–126)
Anion gap: 10 (ref 5–15)
BUN: 5 mg/dL — ABNORMAL LOW (ref 8–23)
CO2: 24 mmol/L (ref 22–32)
Calcium: 8.5 mg/dL — ABNORMAL LOW (ref 8.9–10.3)
Chloride: 103 mmol/L (ref 98–111)
Creatinine, Ser: 1.03 mg/dL — ABNORMAL HIGH (ref 0.44–1.00)
GFR calc Af Amer: 54 mL/min — ABNORMAL LOW (ref >60)
GFR calc non Af Amer: 46 mL/min — ABNORMAL LOW (ref >60)
Glucose, Bld: 100 mg/dL — ABNORMAL HIGH (ref 70–99)
Potassium: 3.5 mmol/L (ref 3.5–5.1)
Sodium: 137 mmol/L (ref 135–145)
Total Bilirubin: 0.8 mg/dL (ref 0.3–1.2)
Total Protein: 7.2 g/dL (ref 6.5–8.1)

## 2020-05-29 LAB — PROTIME-INR
INR: 1.1 (ref 0.8–1.2)
Prothrombin Time: 13.7 seconds (ref 11.4–15.2)

## 2020-05-29 LAB — APTT: aPTT: 46 seconds — ABNORMAL HIGH (ref 24–36)

## 2020-05-29 MED ORDER — ACETAMINOPHEN 650 MG RE SUPP: 650.0000 mg | RECTAL | Status: DC | PRN

## 2020-05-29 MED ORDER — SODIUM CHLORIDE 0.9% FLUSH
3.0000 mL | Freq: Once | INTRAVENOUS | Status: AC
  Administered 2020-05-29: 23:00:00 3 mL via INTRAVENOUS

## 2020-05-29 MED ORDER — ATORVASTATIN CALCIUM 40 MG PO TABS
40.0000 mg | ORAL_TABLET | Freq: Every day | ORAL | Status: DC
  Administered 2020-05-31 – 2020-06-02 (×3): 40 mg via ORAL
  Filled 2020-05-29: qty 4
  Filled 2020-05-29 (×3): qty 1

## 2020-05-29 MED ORDER — ASPIRIN 325 MG PO TABS
325.0000 mg | ORAL_TABLET | Freq: Every day | ORAL | Status: DC
  Administered 2020-05-30: 325 mg via ORAL
  Filled 2020-05-29: qty 1

## 2020-05-29 MED ORDER — ENOXAPARIN SODIUM 30 MG/0.3ML ~~LOC~~ SOLN
30.0000 mg | SUBCUTANEOUS | Status: DC
  Administered 2020-05-31 – 2020-06-02 (×3): 30 mg via SUBCUTANEOUS
  Filled 2020-05-29 (×4): qty 0.3

## 2020-05-29 MED ORDER — ACETAMINOPHEN 160 MG/5ML PO SOLN: 650.0000 mg | ORAL | Status: DC | PRN

## 2020-05-29 MED ORDER — ACETAMINOPHEN 325 MG PO TABS
650.0000 mg | ORAL_TABLET | ORAL | Status: DC | PRN
  Administered 2020-05-30: 650 mg via ORAL
  Filled 2020-05-29: qty 2

## 2020-05-29 MED ORDER — STROKE: EARLY STAGES OF RECOVERY BOOK
Freq: Once | Status: AC
  Filled 2020-05-29: qty 1

## 2020-05-29 NOTE — Consult Note (Signed)
Neurology Consultation  Reason for Consult: code stroke for Rt sided weakness, slurred speech Referring Physician: Dr Madilyn Hook. EDP  CC: Rt arm weakness, slurred speech  History is obtained from: EMS, chart  HPI: Ann Coleman is a 86 y.o. female PMH of HTN, HLD, Stroke in 2018 woth left LE weakness residual, presented to the emergency room after the family called EMS for weakness and inability to walk. According to the family, she was last normal around 4 PM when she started complaining of difficulty walking.  At baseline she walks with a walker but she does walk a lot with her walker both inside and outside the house but after 4 PM she was having a hard time even standing up with her walker.  EMS arrived, noted to have right-sided arm drift and slurred speech-language barrier due to patient only speaking [name]. Brought in as an acute code stroke.  Evaluated at the ER bridge via the [name] interpreter on the iPad-no video available-audio only. Patient was unable to answer orientation questions but did have right upper extremity drift and bilateral lower extremity drift. Hypertensive on initial EMS evaluation with systolics in the 180s to 190s.  Currently systolics in the 160s. Taken for stat CT head-negative for acute bleed. She is outside the window for IV TPA at the time of presentation. Due to focal symptoms, taken for stat MRI of the brain.   LKW: 4 PM on 05/29/2020 tpa given?: no, outside the window Premorbid modified Rankin scale (mRS):3-4  ROS: Daughter arrived later and provided history as well as review of systems.  No preceding illness sickness.  No fevers chills.  No family sick contacts.  No tingling numbness reported.  No nausea vomiting.  No abdominal pain.  No visual disturbances that the patient had been reporting. Patient unable provide history..   Past Medical History:  Diagnosis Date  . High cholesterol   . Hypertension   . Stroke [facility])     Family History  Problem  Relation Age of Onset  . Stroke Neg Hx   . Diabetes Mellitus II Neg Hx   . Hypertension Neg Hx      Social History:   reports that she has never smoked. She has never used smokeless tobacco. She reports that she does not drink alcohol and does not use drugs.  Medications No current facility-administered medications for this encounter.  Current Outpatient Medications:  .  acetaminophen (TYLENOL) 500 MG tablet, Take 500 mg by mouth every 6 (six) hours as needed for moderate pain., Disp: , Rfl:  .  amLODipine (NORVASC) 5 MG tablet, Take 1 tablet (5 mg total) by mouth daily., Disp: 30 tablet, Rfl: 2 .  aspirin 325 MG tablet, Take 1 tablet (325 mg total) by mouth daily., Disp: , Rfl:  .  hydrochlorothiazide (MICROZIDE) 12.5 MG capsule, Take 1 capsule (12.5 mg total) by mouth daily., Disp: 30 capsule, Rfl: 0 .  simvastatin (ZOCOR) 10 MG tablet, Take 1 tablet (10 mg total) by mouth daily at 6 PM., Disp: 30 tablet, Rfl: 0   Exam: Current vital signs: BP (!) 190/100   Pulse 94   Ht 4\' 11"  (1.499 m)   Wt 64 kg   BMI 28.50 kg/m  Vital signs in last 24 hours:  General: Awake alert in no distress HEENT: Normocephalic atraumatic CVS: Regular rate rhythm Respiratory: Breathing well saturating normally on room air Extremities warm well perfused There is some pain on palpation to both knees. Neurological exam She is awake, alert,  oriented to self. She could not tell me the date or place. She was unable to name objects but was able to have some conversation with the phone interpreter and was able to follow commands. Her speech was definitely dysarthric. Cranial nerves: Pupils equal round react light, extraocular movements intact, visual fields full, face appears symmetric, she is edentulous. Motor exam: There is right upper extremity drift on outstretched arms.  No drift in the left upper extremity.  Both lower extremities drifted to bed before 10 seconds. Sensory exam: Intact Coordination:  Difficult to assess  NIH stroke scale-11  Labs I have reviewed labs in epic and the results pertinent to this consultation are:  CBC    Component Value Date/Time   WBC 7.0 05/29/2020 2150   RBC 4.87 05/29/2020 2150   HGB 12.6 05/29/2020 2150   HCT 40.8 05/29/2020 2150   PLT 212 05/29/2020 2150   MCV 83.8 05/29/2020 2150   MCV 83.3 10/29/2012 1225   MCH 25.9 (L) 05/29/2020 2150   MCHC 30.9 05/29/2020 2150   RDW 13.3 05/29/2020 2150   LYMPHSABS 2.6 05/29/2020 2150   MONOABS 1.1 (H) 05/29/2020 2150   EOSABS 0.1 05/29/2020 2150   BASOSABS 0.1 05/29/2020 2150    CMP     Component Value Date/Time   NA 140 05/29/2020 2143   K 3.5 05/29/2020 2143   CL 101 05/29/2020 2143   CO2 26 03/06/2017 0536   GLUCOSE 95 05/29/2020 2143   BUN 11 05/29/2020 2143   CREATININE 0.90 05/29/2020 2143   CREATININE 0.91 10/29/2012 1218   CALCIUM 8.8 (L) 03/06/2017 0536   PROT 6.8 03/05/2017 0451   ALBUMIN 3.0 (L) 03/05/2017 0451   AST 23 03/05/2017 0451   ALT 14 03/05/2017 0451   ALKPHOS 76 03/05/2017 0451   BILITOT 0.9 03/05/2017 0451   GFRNONAA >60 03/06/2017 0536   GFRAA >60 03/06/2017 0536    Imaging I have reviewed the images obtained:  CT-scan of the brain-no acute changes.  Chronic small vessel changes. There is a right calvarial lesion-needs outpatient neurosurgical follow-up  MRI examination of the brain-acute left pontine stroke.  Extensive small vessel disease.  Chronic lacunar infarctions in both cerebral hemispheres.  Assessment: 86 year old with above past medical history with sudden onset of difficulty walking and on initial examination with right upper extremity drift and slurred speech. Outside the window for IV TPA. Exam not consistent with an LVO Stat MRI done that showed an acute left pontine stroke-likely the explanation for her right-sided weakness. Etiology of the stroke likely small vessel disease.   Impression: Acute ischemic stroke-likely small vessel  etiology  Recommendations: Admit to medicine Telemetry Frequent neurochecks Allow for permissive hypertension-treat only if systolic blood pressures are greater than 220 on a as needed basis. Continue aspirin 325 High intensity statin Obtain MRA head without contrast-ordered Carotid Dopplers A1c Echo Lipid panel PT OT speech therapy N.p.o. until cleared by bedside swallow eval a formal swallow evaluation Stroke team will continue to follow with you Outpatient follow-up for the right calvarial lesion seen on CT. Plan relayed to Dr. Pecola Leisure in the ER.  -- Milon Dikes, MD [facility] Pager: [phone] If 7pm to 7am, please call on call as listed on AMION.   CRITICAL CARE ATTESTATION Performed by: Milon Dikes, MD Total critical care time: 45 minutes Critical care time was exclusive of separately billable procedures and treating other patients and/or supervising APPs/Residents/Students Critical care was necessary to treat or prevent imminent or life-threatening deterioration due  to acute ischemic stroke, emergent stroke evaluation This patient is critically ill and at significant risk for neurological worsening and/or death and care requires constant monitoring. Critical care was time spent personally by me on the following activities: development of treatment plan with patient and/or surrogate as well as nursing, discussions with consultants, evaluation of patient's response to treatment, examination of patient, obtaining history from patient or surrogate, ordering and performing treatments and interventions, ordering and review of laboratory studies, ordering and review of radiographic studies, pulse oximetry, re-evaluation of patient's condition, participation in multidisciplinary rounds and medical decision making of high complexity in the care of this patient.

## 2020-05-29 NOTE — ED Provider Notes (Signed)
[facility] EMERGENCY DEPARTMENT Provider Note   CSN: [id number] Arrival date & time: 05/29/20  2143  An emergency department physician performed an initial assessment on this suspected stroke patient at 2140.  History No chief complaint on file.   Ann Coleman is a 86 y.o. female.  The history is provided by the patient, the EMS personnel, medical records and a relative. A language interpreter was used.   Ann Coleman is a 86 y.o. female who presents to the Emergency Department complaining of she presents the emergency department by EMS for evaluation of new onset slow speech and right-sided weakness that started at 4 PM today. Level V caveat due to language barrier.    Past Medical History:  Diagnosis Date  . High cholesterol   . Hypertension   . Stroke [facility])     Patient Active Problem List   Diagnosis Date Noted  . Lacunar infarction (HCC) 05/10/2017  . Stroke (HCC) 03/05/2017  . Acute right MCA stroke (HCC) 03/04/2017  . Hypertensive urgency 03/04/2017  . Stroke (cerebrum) (HCC) 03/04/2017    Past Surgical History:  Procedure Laterality Date  . NO PAST SURGERIES       OB History   No obstetric history on file.     Family History  Problem Relation Age of Onset  . Stroke Neg Hx   . Diabetes Mellitus II Neg Hx   . Hypertension Neg Hx     Social History   Tobacco Use  . Smoking status: Never Smoker  . Smokeless tobacco: Never Used  Substance Use Topics  . Alcohol use: No  . Drug use: No    Home Medications Prior to Admission medications   Medication Sig Start Date End Date Taking? Authorizing Provider  acetaminophen (TYLENOL) 500 MG tablet Take 500 mg by mouth every 6 (six) hours as needed for moderate pain.    [provider]  amLODipine (NORVASC) 5 MG tablet Take 1 tablet (5 mg total) by mouth daily. 03/07/17   Rodolph Bong, MD  aspirin 325 MG tablet Take 1 tablet (325 mg total) by mouth daily. 03/07/17   Rodolph Bong, MD    hydrochlorothiazide (MICROZIDE) 12.5 MG capsule Take 1 capsule (12.5 mg total) by mouth daily. 03/07/17   Rodolph Bong, MD  simvastatin (ZOCOR) 10 MG tablet Take 1 tablet (10 mg total) by mouth daily at 6 PM. 03/06/17   Rodolph Bong, MD    Allergies    Patient has no known allergies.  Review of Systems   Review of Systems  All other systems reviewed and are negative.   Physical Exam Updated Vital Signs BP (!) 190/100   Pulse 94   Ht 4\' 11"  (1.499 m)   Wt 64 kg   BMI 28.50 kg/m   Physical Exam Vitals and nursing note reviewed.  Constitutional:      Appearance: She is well-developed.  HENT:     Head: Normocephalic and atraumatic.  Cardiovascular:     Rate and Rhythm: Normal rate and regular rhythm.     Heart sounds: No murmur heard.   Pulmonary:     Effort: Pulmonary effort is normal. No respiratory distress.     Breath sounds: Normal breath sounds.  Abdominal:     Palpations: Abdomen is soft.     Tenderness: There is no abdominal tenderness. There is no guarding or rebound.  Musculoskeletal:     Comments: Tenderness to palpation over the left knee without any local erythema  or edema.  Skin:    General: Skin is warm and dry.  Neurological:     Mental Status: She is alert and oriented to person, place, and time.     Comments: Mild confusion, slow to answer questions. Mild right upper extremity and right lower extremity weakness. Difficulty assessing strength in the left lower extremity secondary to pain in the left knee.  Psychiatric:        Behavior: Behavior normal.     ED Results / Procedures / Treatments   Labs (all labs ordered are listed, but only abnormal results are displayed) Labs Reviewed  APTT - Abnormal; Notable for the following components:      Result Value   aPTT 46 (*)    All other components within normal limits  CBC - Abnormal; Notable for the following components:   MCH 25.9 (*)    All other components within normal limits   DIFFERENTIAL - Abnormal; Notable for the following components:   Monocytes Absolute 1.1 (*)    All other components within normal limits  COMPREHENSIVE METABOLIC PANEL - Abnormal; Notable for the following components:   Glucose, Bld 100 (*)    BUN 5 (*)    Creatinine, Ser 1.03 (*)    Calcium 8.5 (*)    Albumin 3.2 (*)    GFR calc non Af Amer 46 (*)    GFR calc Af Amer 54 (*)    All other components within normal limits  I-STAT CHEM 8, ED - Abnormal; Notable for the following components:   Calcium, Ion 1.10 (*)    All other components within normal limits  SARS CORONAVIRUS 2 BY RT PCR (HOSPITAL ORDER, PERFORMED IN [facility])  PROTIME-INR  I-STAT CHEM 8, ED  CBG MONITORING, ED    EKG None  Radiology No results found.  Procedures Procedures (including critical care time)  Medications Ordered in ED Medications  sodium chloride flush (NS) 0.9 % injection 3 mL (has no administration in time range)    ED Course  I have reviewed the triage vital signs and the nursing notes.  Pertinent labs & imaging results that were available during my care of the patient were reviewed by me and considered in my medical decision making (see chart for details).    MDM Rules/Calculators/A&P                         Patient presented to the emergency department as a code stroke for right sided weakness that began at 4 PM today. Patient does have right-sided weakness on examination. She is out of the TPA window due to duration of symptoms. She was evaluated by neurology on ED arrival. MRI confirms acute CVA. Plan to admit to the medicine service for further workup and treatment.  Final Clinical Impression(s) / ED Diagnoses Final diagnoses:  Acute CVA (cerebrovascular accident) [facility])    Rx / DC Orders ED Discharge Orders    None       Tilden Fossa, MD 05/29/20 2237

## 2020-05-29 NOTE — ED Notes (Addendum)
Pt belongings bagged up and given to daughter at bedside.

## 2020-05-29 NOTE — ED Notes (Signed)
Family at bedside at this time

## 2020-05-29 NOTE — Significant Event (Signed)
Rapid Response Event Note   Code Stroke called at 2126. Pt arrived at 2136. With complaints of slurred speech and R sided weakness. Pt [place] speaking only.Initial NIH was 11. Pt taken to MRI following CT.     Mordecai Rasmussen

## 2020-05-29 NOTE — ED Triage Notes (Signed)
Pt came in [facility] CODE STROKE. Pt is coming from home. Family reports at 1800, the [pt started feeling weak in her legs. They noticed slowed slurred speech. EMS reports Right Arm Dirft. Hx of TIA in 2017. 165/92, 98HR, 100CBG, Bilateral 20GIV in each AC. Pt speaks [name].

## 2020-05-29 NOTE — ED Notes (Signed)
Pt family member given pt belongings.

## 2020-05-29 NOTE — ED Notes (Signed)
Pt is going to MRI from CT

## 2020-05-29 NOTE — H&P (Signed)
History and Physical    Ann Coleman ZOX:[id number] DOB: 05/18/1924 DOA: 05/29/2020  PCP: Patient, No Pcp Per Patient coming from: Home  Chief Complaint: Slurred speech, leg weakness  HPI: Ann Coleman is a 86 y.o. [place] speaking female with medical history significant of stroke in 2018, hypertension, hyperlipidemia presenting to the ED via EMS as code stroke.  Daughter at bedside states patient was doing well during the day and around 4 PM today she could not get up to use the bathroom as her legs were weak.  Family also noticed that her speech was slurred.  When EMS arrived, they noticed that patient had right arm drift.  Daughter states patient is doing well otherwise and does not take any medications at home.  No complaints of fevers, cough, shortness of breath, chest pain, nausea, vomiting, abdominal pain, or diarrhea.  ED Course: Hypertensive with systolic in the 190s.  Stat Head CT negative for acute intracranial infarct.  Showing an approximate 2.5 cm lytic lesion involving the right frontal calvarium with possible associated pathologic fracture.  Finding is felt to be nonspecific, suspected to have been present on previous CT from 2018, although only faintly visible at that time.  Brain MRI recommended for further evaluation.  Patient was seen by neurology.  tPA not given as she was outside the window at the time of presentation.  Due to focal symptoms, stat brain MRI was ordered and reviewed by neurology -showing an acute left pontine stroke which is felt to be the likely explanation for her right-sided weakness.  Review of Systems:  All systems reviewed and apart from history of presenting illness, are negative.  Past Medical History:  Diagnosis Date  . High cholesterol   . Hypertension   . Stroke [facility](HCC)     Past Surgical History:  Procedure Laterality Date  . NO PAST SURGERIES       reports that she has never smoked. She has never used smokeless tobacco. She reports that she does not  drink alcohol and does not use drugs.  No Known Allergies  Family History  Problem Relation Age of Onset  . Stroke Neg Hx   . Diabetes Mellitus II Neg Hx   . Hypertension Neg Hx     Prior to Admission medications   Medication Sig Start Date End Date Taking? Authorizing Provider  acetaminophen (TYLENOL) 500 MG tablet Take 500 mg by mouth every 6 (six) hours as needed for moderate pain.    [provider]  amLODipine (NORVASC) 5 MG tablet Take 1 tablet (5 mg total) by mouth daily. 03/07/17   Rodolph Bonghompson, Daniel V, MD  aspirin 325 MG tablet Take 1 tablet (325 mg total) by mouth daily. 03/07/17   Rodolph Bonghompson, Daniel V, MD  hydrochlorothiazide (MICROZIDE) 12.5 MG capsule Take 1 capsule (12.5 mg total) by mouth daily. 03/07/17   Rodolph Bonghompson, Daniel V, MD  simvastatin (ZOCOR) 10 MG tablet Take 1 tablet (10 mg total) by mouth daily at 6 PM. 03/06/17   Rodolph Bonghompson, Daniel V, MD    Physical Exam: Vitals:   05/29/20 2147 05/29/20 2254 05/29/20 2300 05/29/20 2330  BP: (!) 190/100 (!) 132/110 (!) 180/90 (!) 164/83  Pulse: 94 94 91 90  Resp:  17 18 19   Temp:  98.2 F (36.8 C)    TempSrc:  Oral    SpO2:  95% 95% 95%  Weight:      Height:        Physical Exam Constitutional:      General:  She is not in acute distress. HENT:     Head: Normocephalic.     Mouth/Throat:     Mouth: Mucous membranes are moist.  Eyes:     Extraocular Movements: Extraocular movements intact.     Pupils: Pupils are equal, round, and reactive to light.  Cardiovascular:     Rate and Rhythm: Normal rate and regular rhythm.     Pulses: Normal pulses.  Pulmonary:     Effort: Pulmonary effort is normal. No respiratory distress.     Breath sounds: Normal breath sounds. No wheezing or rales.  Abdominal:     General: Bowel sounds are normal. There is no distension.     Palpations: Abdomen is soft.     Tenderness: There is no abdominal tenderness. There is no guarding.  Musculoskeletal:        General: No swelling.      Cervical back: Normal range of motion and neck supple.  Skin:    General: Skin is warm and dry.  Neurological:     Mental Status: She is alert and oriented to person, place, and time.     Cranial Nerves: No cranial nerve deficit.     Sensory: No sensory deficit.     Motor: No weakness.     Comments: No facial droop Strength 5 out of 5 in bilateral upper and lower extremities. Sensation to light touch intact throughout.     Labs on Admission: I have personally reviewed following labs and imaging studies  CBC: Recent Labs  Lab 05/29/20 2143 05/29/20 2150  WBC  --  7.0  NEUTROABS  --  3.0  HGB 13.3 12.6  HCT 39.0 40.8  MCV  --  83.8  PLT  --  212   Basic Metabolic Panel: Recent Labs  Lab 05/29/20 2143 05/29/20 2150  NA 140 137  K 3.5 3.5  CL 101 103  CO2  --  24  GLUCOSE 95 100*  BUN 11 5*  CREATININE 0.90 1.03*  CALCIUM  --  8.5*   GFR: Estimated Creatinine Clearance: 27.2 mL/min (A) (by C-G formula based on SCr of 1.03 mg/dL (H)). Liver Function Tests: Recent Labs  Lab 05/29/20 2150  AST 35  ALT 27  ALKPHOS 72  BILITOT 0.8  PROT 7.2  ALBUMIN 3.2*   No results for input(s): LIPASE, AMYLASE in the last 168 hours. No results for input(s): AMMONIA in the last 168 hours. Coagulation Profile: Recent Labs  Lab 05/29/20 2150  INR 1.1   Cardiac Enzymes: No results for input(s): CKTOTAL, CKMB, CKMBINDEX, TROPONINI in the last 168 hours. BNP (last 3 results) No results for input(s): PROBNP in the last 8760 hours. HbA1C: No results for input(s): HGBA1C in the last 72 hours. CBG: No results for input(s): GLUCAP in the last 168 hours. Lipid Profile: No results for input(s): CHOL, HDL, LDLCALC, TRIG, CHOLHDL, LDLDIRECT in the last 72 hours. Thyroid Function Tests: No results for input(s): TSH, T4TOTAL, FREET4, T3FREE, THYROIDAB in the last 72 hours. Anemia Panel: No results for input(s): VITAMINB12, FOLATE, FERRITIN, TIBC, IRON, RETICCTPCT in the last 72  hours. Urine analysis:    Component Value Date/Time   COLORURINE STRAW (A) 03/04/2017 1230   APPEARANCEUR CLEAR 03/04/2017 1230   LABSPEC 1.005 03/04/2017 1230   PHURINE 7.0 03/04/2017 1230   GLUCOSEU NEGATIVE 03/04/2017 1230   HGBUR NEGATIVE 03/04/2017 1230   BILIRUBINUR NEGATIVE 03/04/2017 1230   KETONESUR NEGATIVE 03/04/2017 1230   PROTEINUR NEGATIVE 03/04/2017 1230   NITRITE NEGATIVE  03/04/2017 1230   LEUKOCYTESUR NEGATIVE 03/04/2017 1230    Radiological Exams on Admission: CT HEAD CODE STROKE WO CONTRAST  Result Date: 05/29/2020 CLINICAL DATA:  Code stroke. Initial evaluation for acute slurred speech, leg weakness. EXAM: CT HEAD WITHOUT CONTRAST TECHNIQUE: Contiguous axial images were obtained from the base of the skull through the vertex without intravenous contrast. COMPARISON:  Prior study from 03/04/2017. FINDINGS: Brain: Generalized age-related cerebral atrophy with moderate chronic microvascular ischemic disease. Scattered remote lacunar infarcts present about the bilateral basal ganglia and right internal capsule/thalamus. No acute intracranial hemorrhage. No acute large vessel territory infarct. No parenchymal mass lesion, mass effect, or midline shift. No hydrocephalus or extra-axial fluid collection. There is a hyperdense ovoid lesion measuring approximately 2.5 cm positioned within the right frontal calvarium (series 3, image 18). Upon review of prior CT from 2018, this lesion was likely present, although faintly visible at that time. Associated lytic changes within the underlying calvarium with probable associated pathologic fracture (series 4, image 46). Soft tissue component extends into the overlying right frontal scalp as well. Vascular: No hyperdense vessel. Calcified atherosclerosis present at the skull base. Skull: 2.5 cm lytic lesion involving the right frontal calvarium as above. Calvarium otherwise intact. No other scalp soft tissue abnormality. Sinuses/Orbits: Globes  and orbital soft tissues within normal limits. Scattered mucosal thickening noted within the visualized ethmoidal air cells. Visualized paranasal sinuses are otherwise clear. No mastoid effusion. Other: None. ASPECTS [facility] Stroke Program Early CT Score) - Ganglionic level infarction (caudate, lentiform nuclei, internal capsule, insula, M1-M3 cortex): 7 - Supraganglionic infarction (M4-M6 cortex): 3 Total score (0-10 with 10 being normal): 10 IMPRESSION: 1. No acute intracranial infarct or other abnormality. 2. ASPECTS is 10. 3. Approximate 2.5 cm lytic lesion involving the right frontal calvarium with probable associated pathologic fracture. Finding is nonspecific, although is suspected to have been present on previous CT from 2018, although only faintly visible at that time. Primary differential consideration consists of an intra osseous meningioma, although possible osseous metastasis not excluded. Further evaluation with dedicated brain MRI, with and without contrast suggested for further evaluation. Additionally, neuro surgical consultation and referral for possible histologic sampling suggested. 4. Underlying age-related cerebral atrophy with moderate chronic microvascular ischemic disease, with multiple remote lacunar infarcts about the bilateral basal ganglia as above. Critical Value/emergent results were called by telephone at the time of interpretation on 05/29/2020 at 10:32 pm to provider [facility] , who verbally acknowledged these results. Electronically Signed   By: Rise Mu M.D.   On: 05/29/2020 22:36    EKG: Pending at this time.  Assessment/Plan Principal Problem:   Acute CVA (cerebrovascular accident) (HCC) Active Problems:   Hypertensive urgency   HLD (hyperlipidemia)   Acute CVA: Patient is here for evaluation of slurred speech, lower extremity weakness, and right arm drift.  Seen by neurology.  tPA was not given as she was outside the window at the time of  presentation.  Head CT negative for acute intracranial infarct.  Brain MRI (radiologist read pending) - reviewed by neurology and showing an acute left pontine stroke.  Patient has a history of prior stroke in 2018 and is currently not on any medications as reported by her daughter. -Telemetry monitoring -Allow for permissive hypertension-treat only if systolic blood pressures are greater than 220. -MRA head without contrast pending -2D echocardiogram -Hemoglobin A1c, fasting lipid panel -Aspirin 325 p.o. now and daily -Atorvastatin 40 mg daily -Frequent neurochecks -PT, OT, speech therapy. -N.p.o. until cleared by bedside  swallow evaluation or formal speech evaluation  Right calvarial lytic lesion: Showing an approximate 2.5 cm lytic lesion involving the right frontal calvarium with possible associated pathologic fracture.  Finding is felt to be nonspecific, suspected to have been present on previous CT from 2018, although only faintly visible at that time.  Brain MRI was recommended for further evaluation. -Brain MRI has been done but radiologist read pending  Hypertensive urgency: Patient is not taking any antihypertensives at home.  Blood pressure significantly elevated on arrival with systolic in the 190s.  Systolic now down to 130s. -Allow permissive hypertension at this time given acute stroke  Hyperlipidemia: Currently not on a statin. -Start high intensity statin given acute stroke.  Check lipid panel.  DVT prophylaxis: Lovenox (renally dosed) Code Status: DNR-discussed with patient and daughter at bedside Family Communication: Daughter updated. Disposition Plan: Status is: Inpatient  Remains inpatient appropriate because:Ongoing diagnostic testing needed not appropriate for outpatient work up and Inpatient level of care appropriate due to severity of illness   Dispo: The patient is from: Home              Anticipated d/c is to: Home              Anticipated d/c date is: 2  days              Patient currently is not medically stable to d/c.  The medical decision making on this patient was of high complexity and the patient is at high risk for clinical deterioration, therefore this is a level 3 visit.  John Giovanni MD [facility]  If 7PM-7AM, please contact night-coverage [email or web address]  05/29/2020, 11:56 PM

## 2020-05-29 NOTE — ED Notes (Signed)
Spoke with Clearnce Hasten, RN provided contact information for nurse to return call for pt report

## 2020-05-30 ENCOUNTER — Other Ambulatory Visit: Payer: Self-pay

## 2020-05-30 ENCOUNTER — Encounter (HOSPITAL_COMMUNITY): Payer: Self-pay | Admitting: Internal Medicine

## 2020-05-30 ENCOUNTER — Inpatient Hospital Stay (HOSPITAL_COMMUNITY): Payer: Medicare Other

## 2020-05-30 DIAGNOSIS — I639 Cerebral infarction, unspecified: Secondary | ICD-10-CM

## 2020-05-30 DIAGNOSIS — Z8673 Personal history of transient ischemic attack (TIA), and cerebral infarction without residual deficits: Secondary | ICD-10-CM

## 2020-05-30 DIAGNOSIS — I160 Hypertensive urgency: Secondary | ICD-10-CM

## 2020-05-30 DIAGNOSIS — I351 Nonrheumatic aortic (valve) insufficiency: Secondary | ICD-10-CM

## 2020-05-30 LAB — LIPID PANEL
Cholesterol: 204 mg/dL — ABNORMAL HIGH (ref 0–200)
HDL: 40 mg/dL — ABNORMAL LOW (ref >40)
LDL Cholesterol: 136 mg/dL — ABNORMAL HIGH (ref 0–99)
Total CHOL/HDL Ratio: 5.1 RATIO
Triglycerides: 139 mg/dL (ref <150)
VLDL: 28 mg/dL (ref 0–40)

## 2020-05-30 LAB — ECHOCARDIOGRAM COMPLETE
Height: 59 in
Weight: 2423.3 oz

## 2020-05-30 LAB — SARS CORONAVIRUS 2 BY RT PCR (HOSPITAL ORDER, PERFORMED IN ~~LOC~~ HOSPITAL LAB): SARS Coronavirus 2: NEGATIVE

## 2020-05-30 MED ORDER — IOHEXOL 350 MG/ML SOLN
75.0000 mL | Freq: Once | INTRAVENOUS | Status: AC | PRN
  Administered 2020-05-30: 75 mL via INTRAVENOUS

## 2020-05-30 MED ORDER — CLOPIDOGREL BISULFATE 75 MG PO TABS
75.0000 mg | ORAL_TABLET | Freq: Every day | ORAL | Status: DC
  Administered 2020-05-31 – 2020-06-02 (×3): 75 mg via ORAL
  Filled 2020-05-30 (×4): qty 1

## 2020-05-30 MED ORDER — ASPIRIN EC 325 MG PO TBEC: 325.0000 mg | DELAYED_RELEASE_TABLET | Freq: Every day | ORAL | Status: DC

## 2020-05-30 MED ORDER — RESOURCE THICKENUP CLEAR PO POWD
ORAL | Status: DC | PRN
  Filled 2020-05-30: qty 125

## 2020-05-30 MED ORDER — ASPIRIN EC 81 MG PO TBEC
81.0000 mg | DELAYED_RELEASE_TABLET | Freq: Every day | ORAL | Status: DC
  Administered 2020-05-31 – 2020-06-02 (×3): 81 mg via ORAL
  Filled 2020-05-30 (×3): qty 1

## 2020-05-30 NOTE — Evaluation (Signed)
Speech Language Pathology Evaluation Patient Details Name: Ann Coleman MRN: [id number] DOB: 1924-07-04 Today's Date: 05/30/2020 Time: 7673-4193 SLP Time Calculation (min) (ACUTE ONLY): 30 min  Problem List:  Patient Active Problem List   Diagnosis Date Noted  . Acute CVA (cerebrovascular accident) (HCC) 05/29/2020  . HLD (hyperlipidemia) 05/29/2020  . Lacunar infarction (HCC) 05/10/2017  . Stroke (HCC) 03/05/2017  . Acute right MCA stroke (HCC) 03/04/2017  . Hypertensive urgency 03/04/2017  . Stroke (cerebrum) (HCC) 03/04/2017   Past Medical History:  Past Medical History:  Diagnosis Date  . High cholesterol   . Hypertension   . Stroke Ann Coleman)    Past Surgical History:  Past Surgical History:  Procedure Laterality Date  . NO PAST SURGERIES     HPI:  Ann Coleman is a 86 y.o. [name] speaking female with medical history significant of stroke in 2018, hypertension, hyperlipidemia.  Daughter reported pt could not get up to use the bathroom as her legs were weak.  Family also noticed that her speech was slurred.  When EMS arrived, they noticed that patient had right arm drift.  stat brain MRI was ordered and reviewed by neurology -showing an acute left pontine stroke which is felt to be the likely explanation for her right-sided weakness. Pt passed [place] in ED, but struggled taking a pill later.    Assessment / Plan / Recommendation Clinical Impression  Pt seen with daughter as interpreter at bedside; given her occasional inattention and distractible behavior, a video interpreter was inappropriate. Pt restless, easily distracted by left IV. She is oriented to self and place but not situation, does not understand why her arm is not functioning. Pt is not very verbal, need cues and repetition to consistently respond to daughter particularly to open ended questions. However, she followed 100% of commands, naming was accurate 10/10, able to count, state her name and family's name. She is mildly  dysarthric. Pt will need f/u diagnostic assessment and will need SLP at next level of care. CIR suggested.     SLP Assessment  SLP Recommendation/Assessment: Patient needs continued Speech Lanaguage Pathology Services SLP Visit Diagnosis: Dysarthria and anarthria (R47.1)    Follow Up Recommendations  Inpatient Rehab    Frequency and Duration min 2x/week  2 weeks      SLP Evaluation Cognition  Overall Cognitive Status: Impaired/Different from baseline Arousal/Alertness: Awake/alert Orientation Level: Oriented to person;Oriented to place;Disoriented to situation Attention: Focused;Sustained Focused Attention: Appears intact Sustained Attention: Impaired Sustained Attention Impairment: Verbal basic;Functional basic Awareness: Impaired Awareness Impairment: Emergent impairment Problem Solving: Impaired Problem Solving Impairment: Verbal basic;Functional basic       Comprehension  Auditory Comprehension Overall Auditory Comprehension: Appears within functional limits for tasks assessed    Expression Verbal Expression Overall Verbal Expression: Impaired Initiation: No impairment Automatic Speech: Name;Social Response;Counting Level of Generative/Spontaneous Verbalization: Word;Phrase Repetition: No impairment Naming: No impairment Pragmatics: Impairment Written Expression Dominant Hand: Right   Oral / Motor  Oral Motor/Sensory Function Overall Oral Motor/Sensory Function: Moderate impairment Facial ROM: Reduced right;Suspected CN VII (facial) dysfunction Facial Symmetry: Abnormal symmetry right;Suspected CN VII (facial) dysfunction Facial Strength: Reduced right;Suspected CN VII (facial) dysfunction Lingual ROM: Within Functional Limits Lingual Symmetry: Within Functional Limits Lingual Strength: Within Functional Limits Lingual Sensation: Within Functional Limits Motor Speech Overall Motor Speech: Impaired Respiration: Within functional limits Phonation:  Normal Resonance: Within functional limits Articulation: Impaired Level of Impairment: Word Intelligibility: Intelligible Motor Planning: Witnin functional limits Motor Speech Errors: Aware;Consistent   GO  Harlon Ditty, MA CCC-SLP  Acute Rehabilitation Services Pager [phone] Office [phone]  Claudine Mouton 05/30/2020, 12:07 PM

## 2020-05-30 NOTE — Consult Note (Signed)
Physical Medicine and Rehabilitation Consult Reason for Consult: Right side weakness with slurred speech Referring Physician: [facility]   HPI: Ann Coleman is a 86 y.o. right-handed non-English-speaking [name] female with history of hypertension and hyperlipidemia as well as CVA 3 years ago.  Per chart review patient lives with her children.  1 level home 2 steps to entry.  Reportedly independent prior to admission using a walker and assistance from her daughter.  Presented 05/29/2020 with left side weakness and slurred speech.  Noted systolic blood pressure in Ann 190s.  CT/MRI showed a subtle approximately 6 mm acute ischemic nonhemorrhagic left pontine infarction as well as a 2.8 cm mass involving Ann right frontal calvarium with extraosseous extension.  In retrospect this lesion appeared to have been present on her prior MRI from 2018.  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  This did appear chronic is again noted on MRI 2018.  Echocardiogram with ejection fraction of 75% no regional wall motion abnormalities.  Admission chemistries BUN 5 creatinine 1.03, SARS coronavirus negative.  Dysphagia #1 nectar thick liquids.  Therapy evaluation completed with recommendations of physical medicine rehab consult.  Currently maintained on aspirin and Plavix for CVA prophylaxis x3 weeks then aspirin alone.  Subcutaneous Lovenox for DVT prophylaxis.   Review of Systems  Unable to perform ROS: Language   Past Medical History:  Diagnosis Date  . High cholesterol   . Hypertension   . Stroke [facility])    Past Surgical History:  Procedure Laterality Date  . NO PAST SURGERIES     Family History  Problem Relation Age of Onset  . Stroke Neg Hx   . Diabetes Mellitus II Neg Hx   . Hypertension Neg Hx    Social History:  reports that she has never smoked. She has never used smokeless tobacco. She  reports that she does not drink alcohol and does not use drugs. Allergies: No Known Allergies Medications Prior to Admission  Medication Sig Dispense Refill  . acetaminophen (TYLENOL) 500 MG tablet Take 500 mg by mouth every 6 (six) hours as needed for moderate pain.    Marland Kitchen amLODipine (NORVASC) 5 MG tablet Take 1 tablet (5 mg total) by mouth daily. (Patient not taking: Reported on 05/29/2020) 30 tablet 2  . aspirin 325 MG tablet Take 1 tablet (325 mg total) by mouth daily. (Patient not taking: Reported on 05/29/2020)    . hydrochlorothiazide (MICROZIDE) 12.5 MG capsule Take 1 capsule (12.5 mg total) by mouth daily. (Patient not taking: Reported on 05/29/2020) 30 capsule 0  . simvastatin (ZOCOR) 10 MG tablet Take 1 tablet (10 mg total) by mouth daily at 6 PM. (Patient not taking: Reported on 05/29/2020) 30 tablet 0    Home: Home Living Family/patient expects to be discharged to:: Private residence Living Arrangements: Children Type of Home: House Home Access: Stairs to enter Secretary/administrator of Steps: 2 Entrance Stairs-Rails: None Home Layout: One level Bathroom Shower/Tub: Engineer, manufacturing systems: Standard Home Equipment: Environmental consultant - 4 wheels, Hand held shower head Additional Comments: limited in questions due to communication barriers. Some above information was taken from prior admission but pt unable to confirm if it is still correct.   Lives With: Daughter  Functional History: Prior Function Comments: Limited information due to communication barrier. Pt reports she did not need DME to ambulate and lived with daughter Functional Status:  Mobility: Bed Mobility Overal bed mobility: Needs Assistance Bed Mobility: Rolling, Supine to Sit, Sit to Supine Rolling: Mod assist, +2 for physical assistance, +2 for safety/equipment Supine to sit: Mod assist, +2 for physical assistance, +2 for safety/equipment Sit to supine: Mod assist, +2 for physical assistance, +2 for  safety/equipment General bed mobility comments: Mod A+2 for bed mobility due to language barrier and for pt safety Transfers General transfer comment: deferred due to safety      ADL: ADL Overall ADL's : Needs assistance/impaired Eating/Feeding: NPO Grooming: Set up, Sitting, Min guard Grooming Details (indicate cue type and reason): pt able to bring hand to face in sitting with min guard for balance Upper Body Bathing: Moderate assistance, Sitting, Minimal assistance Upper Body Bathing Details (indicate cue type and reason): Min-Mod A with sitting balance Lower Body Bathing: Total assistance, +2 for physical assistance, +2 for safety/equipment, Sitting/lateral leans Lower Body Bathing Details (indicate cue type and reason): Pt refusing and saying she could not do LB ADLs Upper Body Dressing : Minimal assistance, Moderate assistance, Sitting Upper Body Dressing Details (indicate cue type and reason): Min-Mod A for sitting balance Lower Body Dressing: Total assistance, +2 for physical assistance, +2 for safety/equipment, Sit to/from stand Lower Body Dressing Details (indicate cue type and reason): Pt stating she could not do LB ADLs Toilet Transfer Details (indicate cue type and reason): deferred due to safety need to futher assess Toileting - Clothing Manipulation Details (indicate cue type and reason): deferred due to safety need to futher assess Tub/Shower Transfer Details (indicate cue type and reason): deferred due to safety need to futher assess Functional mobility during ADLs: +2 for physical assistance, +2 for safety/equipment, Moderate assistance General ADL Comments: Mod A +2 for bed mobility. Limited eval due to language barrier and pt participation  Cognition: Cognition Overall Cognitive Status: No family/caregiver present to determine baseline cognitive functioning Arousal/Alertness: Awake/alert Orientation Level: Oriented to person, Oriented to place, Disoriented to  situation Attention: Focused, Sustained Focused Attention: Appears intact Sustained Attention: Impaired Sustained Attention Impairment: Verbal basic, Functional basic Awareness: Impaired Awareness Impairment: Emergent impairment Problem Solving: Impaired Problem Solving Impairment: Verbal basic, Functional basic Cognition Arousal/Alertness: Awake/alert Behavior During Therapy: Flat affect Overall Cognitive Status: No family/caregiver present to determine baseline cognitive functioning General Comments: Pt inconsistent with responding to questions and following commands  Blood pressure (!) 181/85, pulse 61, temperature 98.7 F (37.1 C), temperature source Oral, resp. rate 18, height 4\' 11"  (1.499 m), weight 68.7 kg, SpO2 98 %.   Physical Exam General: Alert and oriented x 3, No apparent distress HEENT: Head is normocephalic, atraumatic, PERRLA, EOMI, sclera anicteric, oral mucosa pink and moist, dentition intact, ext ear canals clear,  Neck: Supple without JVD or lymphadenopathy Heart: Reg rate and rhythm. No murmurs rubs or gallops Chest: CTA bilaterally without wheezes, rales, or rhonchi; no distress Abdomen: Soft, non-tender, non-distended, bowel sounds positive. Extremities: No clubbing, cyanosis, or edema. Pulses are 2+ Skin: Clean and intact without signs of breakdown Neuro: Patient is awake and alert.  Makes eye contact with examiner.  Examination limited due to language barrier.  She did follow simple demonstrated commands. Right side flaccid. Left upper extremity is 4/5. LLE 3/5.  Psych: Pt's affect is flat. Pt is cooperative  Results for orders placed or performed during Ann hospital encounter of 05/29/20 (from Ann past 24 hour(s))  I-stat chem 8, ed     Status: Abnormal   Collection Time: 05/29/20  9:43 PM  Result Value Ref Range  Sodium 140 135 - 145 mmol/L   Potassium 3.5 3.5 - 5.1 mmol/L   Chloride 101 98 - 111 mmol/L   BUN 11 8 - 23 mg/dL   Creatinine, Ser 5.780.90  0.44 - 1.00 mg/dL   Glucose, Bld 95 70 - 99 mg/dL   Calcium, Ion 4.691.10 (L) 1.15 - 1.40 mmol/L   TCO2 26 22 - 32 mmol/L   Hemoglobin 13.3 12.0 - 15.0 g/dL   HCT 62.939.0 36 - 46 %  Protime-INR     Status: None   Collection Time: 05/29/20  9:50 PM  Result Value Ref Range   Prothrombin Time 13.7 11.4 - 15.2 seconds   INR 1.1 0.8 - 1.2  APTT     Status: Abnormal   Collection Time: 05/29/20  9:50 PM  Result Value Ref Range   aPTT 46 (H) 24 - 36 seconds  CBC     Status: Abnormal   Collection Time: 05/29/20  9:50 PM  Result Value Ref Range   WBC 7.0 4.0 - 10.5 K/uL   RBC 4.87 3.87 - 5.11 MIL/uL   Hemoglobin 12.6 12.0 - 15.0 g/dL   HCT 52.840.8 36 - 46 %   MCV 83.8 80.0 - 100.0 fL   MCH 25.9 (L) 26.0 - 34.0 pg   MCHC 30.9 30.0 - 36.0 g/dL   RDW 41.313.3 24.411.5 - 01.015.5 %   Platelets 212 150 - 400 K/uL   nRBC 0.0 0.0 - 0.2 %  Differential     Status: Abnormal   Collection Time: 05/29/20  9:50 PM  Result Value Ref Range   Neutrophils Relative % 43 %   Neutro Abs 3.0 1.7 - 7.7 K/uL   Lymphocytes Relative 38 %   Lymphs Abs 2.6 0.7 - 4.0 K/uL   Monocytes Relative 16 %   Monocytes Absolute 1.1 (H) 0 - 1 K/uL   Eosinophils Relative 2 %   Eosinophils Absolute 0.1 0 - 0 K/uL   Basophils Relative 1 %   Basophils Absolute 0.1 0 - 0 K/uL   Immature Granulocytes 0 %   Abs Immature Granulocytes 0.02 0.00 - 0.07 K/uL  Comprehensive metabolic panel     Status: Abnormal   Collection Time: 05/29/20  9:50 PM  Result Value Ref Range   Sodium 137 135 - 145 mmol/L   Potassium 3.5 3.5 - 5.1 mmol/L   Chloride 103 98 - 111 mmol/L   CO2 24 22 - 32 mmol/L   Glucose, Bld 100 (H) 70 - 99 mg/dL   BUN 5 (L) 8 - 23 mg/dL   Creatinine, Ser 2.721.03 (H) 0.44 - 1.00 mg/dL   Calcium 8.5 (L) 8.9 - 10.3 mg/dL   Total Protein 7.2 6.5 - 8.1 g/dL   Albumin 3.2 (L) 3.5 - 5.0 g/dL   AST 35 15 - 41 U/L   ALT 27 0 - 44 U/L   Alkaline Phosphatase 72 38 - 126 U/L   Total Bilirubin 0.8 0.3 - 1.2 mg/dL   GFR calc non Af Amer 46 (L)  >60 mL/min   GFR calc Af Amer 54 (L) >60 mL/min   Anion gap 10 5 - 15  SARS Coronavirus 2 by RT PCR (hospital order, performed [facility]) Nasopharyngeal Nasopharyngeal Swab     Status: None   Collection Time: 05/29/20 10:55 PM   Specimen: Nasopharyngeal Swab  Result Value Ref Range   SARS Coronavirus 2 NEGATIVE NEGATIVE  Lipid panel     Status: Abnormal  Collection Time: 05/30/20  4:25 AM  Result Value Ref Range   Cholesterol 204 (H) 0 - 200 mg/dL   Triglycerides 161 <096 mg/dL   HDL 40 (L) >04 mg/dL   Total CHOL/HDL Ratio 5.1 RATIO   VLDL 28 0 - 40 mg/dL   LDL Cholesterol 540 (H) 0 - 99 mg/dL   CT ANGIO HEAD W OR WO CONTRAST  Addendum Date: 05/30/2020   ADDENDUM REPORT: 05/30/2020 13:23 ADDENDUM: Enlarged heterogeneous thyroid gland. Largest single nodule 2 cm. recommend thyroid ultrasound (ref: J Am Coll Radiol. 2015 Feb;12(2): 143-50). Electronically Signed   By: Paulina Fusi M.D.   On: 05/30/2020 13:23   Result Date: 05/30/2020 CLINICAL DATA:  Acute presentation with slurred speech and leg weakness. Small left pontine infarction suspected by MRI. EXAM: CT ANGIOGRAPHY HEAD AND NECK TECHNIQUE: Multidetector CT imaging of Ann head and neck was performed using Ann standard protocol during bolus administration of intravenous contrast. Multiplanar CT image reconstructions and MIPs were obtained to evaluate Ann vascular anatomy. Carotid stenosis measurements (when applicable) are obtained utilizing NASCET criteria, using Ann distal internal carotid diameter as Ann denominator. CONTRAST:  75mL OMNIPAQUE IOHEXOL 350 MG/ML SOLN COMPARISON:  MR studies done yesterday. FINDINGS: CTA NECK FINDINGS Aortic arch: Aortic atherosclerosis. No aneurysm or brachiocephalic vessel origin stenosis. 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 Right carotid system: Common carotid artery is widely patent to Ann bifurcation region. Atherosclerotic plaque at  carotid bifurcation and ICA bulb but without stenosis compared to Ann more distal cervical ICA diameter. Left carotid system: Common carotid artery widely patent to Ann bifurcation. Atherosclerotic plaque at Ann carotid bifurcation and ICA bulb but without stenosis compared to Ann more distal cervical ICA diameter. Vertebral arteries: Both vertebral artery origins are widely patent. Both vertebral arteries are widely patent through Ann cervical region to Ann foramen magnum. Skeleton: Ordinary cervical spondylosis. Right frontal calvarial mass as described at previous MRI. Other neck: No lymphadenopathy. Enlarged heterogeneous thyroid gland with multiple nodules, Ann largest measuring 2.1 cm. Upper chest: Negative Review of Ann MIP images confirms Ann above findings CTA HEAD FINDINGS Anterior circulation: Both internal carotid arteries are patent through Ann skull base and siphon regions. Ordinary siphon atherosclerotic calcification but without stenosis greater than 30%. Ann anterior and middle cerebral vessels are patent without correctable proximal stenosis. Ann anterior temporal branch of Ann MCA on Ann left appears to be occluded. Posterior circulation: Both vertebral arteries are widely patent to Ann basilar. Mild atherosclerotic irregularity of Ann basilar artery with distal basilar narrowing of about 30%. Superior cerebellar and posterior cerebral arteries are patent. Distal PCA branch vessels show atherosclerotic irregularity. Venous sinuses: Patent and normal. Anatomic variants: None other significant. Review of Ann MIP images confirms Ann above findings IMPRESSION: Aortic atherosclerosis. 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 Atherosclerotic change at both carotid bifurcations but no stenosis. Atherosclerotic change of Ann carotid siphon regions but without stenosis greater than 30%. Apparent occlusion of Ann anterior temporal branch of Ann left middle cerebral  artery. This is probably chronic, similar to Ann MR angiography of 2018. Distal vessel atherosclerotic narrowing and irregularity of Ann MCA and PCA branches. Atherosclerotic irregularity of Ann basilar artery, maximal stenosis in Ann distal basilar region of approximately 30%. Right frontal calvarial lytic mass as described by MRI. Differential diagnosis is primarily metastatic disease versus myeloma versus intra osseous meningioma versus enlarging hemangioma. Electronically Signed: By: Paulina Fusi  M.D. On: 05/30/2020 12:02   CT ANGIO NECK W OR WO CONTRAST  Addendum Date: 05/30/2020   ADDENDUM REPORT: 05/30/2020 13:23 ADDENDUM: Enlarged heterogeneous thyroid gland. Largest single nodule 2 cm. recommend thyroid ultrasound (ref: J Am Coll Radiol. 2015 Feb;12(2): 143-50). Electronically Signed   By: Paulina Fusi M.D.   On: 05/30/2020 13:23   Result Date: 05/30/2020 CLINICAL DATA:  Acute presentation with slurred speech and leg weakness. Small left pontine infarction suspected by MRI. EXAM: CT ANGIOGRAPHY HEAD AND NECK TECHNIQUE: Multidetector CT imaging of Ann head and neck was performed using Ann standard protocol during bolus administration of intravenous contrast. Multiplanar CT image reconstructions and MIPs were obtained to evaluate Ann vascular anatomy. Carotid stenosis measurements (when applicable) are obtained utilizing NASCET criteria, using Ann distal internal carotid diameter as Ann denominator. CONTRAST:  75mL OMNIPAQUE IOHEXOL 350 MG/ML SOLN COMPARISON:  MR studies done yesterday. FINDINGS: CTA NECK FINDINGS Aortic arch: Aortic atherosclerosis. No aneurysm or brachiocephalic vessel origin stenosis. 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 Right carotid system: Common carotid artery is widely patent to Ann bifurcation region. Atherosclerotic plaque at carotid bifurcation and ICA bulb but without stenosis compared to Ann more distal cervical ICA diameter.  Left carotid system: Common carotid artery widely patent to Ann bifurcation. Atherosclerotic plaque at Ann carotid bifurcation and ICA bulb but without stenosis compared to Ann more distal cervical ICA diameter. Vertebral arteries: Both vertebral artery origins are widely patent. Both vertebral arteries are widely patent through Ann cervical region to Ann foramen magnum. Skeleton: Ordinary cervical spondylosis. Right frontal calvarial mass as described at previous MRI. Other neck: No lymphadenopathy. Enlarged heterogeneous thyroid gland with multiple nodules, Ann largest measuring 2.1 cm. Upper chest: Negative Review of Ann MIP images confirms Ann above findings CTA HEAD FINDINGS Anterior circulation: Both internal carotid arteries are patent through Ann skull base and siphon regions. Ordinary siphon atherosclerotic calcification but without stenosis greater than 30%. Ann anterior and middle cerebral vessels are patent without correctable proximal stenosis. Ann anterior temporal branch of Ann MCA on Ann left appears to be occluded. Posterior circulation: Both vertebral arteries are widely patent to Ann basilar. Mild atherosclerotic irregularity of Ann basilar artery with distal basilar narrowing of about 30%. Superior cerebellar and posterior cerebral arteries are patent. Distal PCA branch vessels show atherosclerotic irregularity. Venous sinuses: Patent and normal. Anatomic variants: None other significant. Review of Ann MIP images confirms Ann above findings IMPRESSION: Aortic atherosclerosis. 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 Atherosclerotic change at both carotid bifurcations but no stenosis. Atherosclerotic change of Ann carotid siphon regions but without stenosis greater than 30%. 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 This is probably chronic, similar to Ann MR angiography of 2018. Distal vessel atherosclerotic  narrowing and irregularity of Ann MCA and PCA branches. Atherosclerotic irregularity of Ann basilar artery, maximal stenosis in Ann distal basilar region of approximately 30%. Right frontal calvarial lytic mass as described by MRI. Differential diagnosis is primarily metastatic disease versus myeloma versus intra osseous meningioma versus enlarging hemangioma. Electronically Signed: By: Paulina Fusi M.D. On: 05/30/2020 12:02   MR ANGIO HEAD WO CONTRAST  Result Date: 05/30/2020 CLINICAL DATA:  Initial evaluation for acute stroke, slurred speech, right-sided weakness. EXAM: MRI HEAD WITHOUT CONTRAST MRA HEAD WITHOUT CONTRAST TECHNIQUE: Multiplanar, multiecho pulse sequences of Ann brain and surrounding structures were obtained without intravenous contrast. Angiographic images  of Ann head were obtained using MRA technique without contrast. COMPARISON:  Prior CT from earlier same day as well as previous MRI from 03/04/2017. FINDINGS: MRI HEAD FINDINGS Brain: Examination significantly degraded by motion artifact. Diffuse prominence of Ann CSF containing spaces compatible generalized age-related cerebral atrophy. Patchy T2/FLAIR hyperintensity within Ann periventricular and deep white matter both cerebral hemispheres most consistent with chronic small vessel ischemic disease, moderate in nature. Multiple remote lacunar infarcts present about Ann bilateral basal ganglia and right thalamus. Subtle approximate 6 mm focus of diffusion abnormality seen involving Ann left paramedian pons, suspicious for an acute ischemic infarct (series 2, image 15). No visible associated hemorrhage or mass effect on this motion degraded exam. No other diffusion abnormality to suggest acute or subacute ischemia. Gray-white matter differentiation otherwise maintained. No other areas of remote cortical infarction. No foci of susceptibility artifact to suggest acute or chronic intracranial hemorrhage on this motion degraded exam. 2.8 cm mass  involving Ann right frontal calvarium with extraosseous extension again seen, indeterminate. No associated mass effect on Ann subjacent right frontal lobe. No other mass lesion. No mass effect or midline shift. No hydrocephalus or extra-axial fluid collection. Pituitary gland grossly within normal limits. Midline structures intact. Vascular: Major intracranial vascular flow voids are maintained. Skull and upper cervical spine: Craniocervical junction within normal limits. Upper cervical spine normal. Bone marrow signal intensity within normal limits. No other focal marrow replacing lesions. Scalp soft tissues otherwise unremarkable. Sinuses/Orbits: Globes and orbital soft tissues within normal limits. Visualized paranasal sinuses are grossly clear. No significant mastoid effusion. Other: None. MRA HEAD FINDINGS ANTERIOR CIRCULATION: Examination moderately degraded by motion artifact. Visualized distal cervical segments of Ann internal carotid arteries are patent with antegrade flow. Petrous segments widely patent. Scattered atheromatous irregularity throughout Ann cavernous/supraclinoid ICAs without hemodynamically significant stenosis. Right A1 segment widely patent. Left A1 hypoplastic and/or absent, accounting for Ann diminutive left ICA is compared to Ann right. Normal anterior communicating artery complex. Anterior cerebral arteries patent to their distal aspects without appreciable stenosis. M1 segments patent bilaterally. Grossly normal MCA bifurcations. Distal MCA branches well perfused and fairly symmetric. POSTERIOR CIRCULATION: Vertebral arteries patent to Ann vertebrobasilar junction without stenosis. Left vertebral artery slightly dominant. Neither PICA origin visualized. Basilar widely patent proximally. There is a short-segment moderate stenosis involving Ann mid basilar artery (series 2, image 91). Basilar otherwise patent to its distal aspect. Superior cerebral arteries grossly patent proximally.  PCAs primarily supplied via Ann basilar, both of which are grossly patent to their distal aspects without definite stenosis, although evaluation limited by motion. No intracranial aneurysm. IMPRESSION: MRI HEAD IMPRESSION: 1. Motion degraded exam. 2. Subtle approximate 6 mm acute ischemic nonhemorrhagic left pontine infarct. 3. 2.8 cm mass involving Ann right frontal calvarium with extraosseous extension, indeterminate. In retrospect, this lesion appears to have been present on prior MRI from 2018, although is Stassi larger on today's exam. Again, primary differential considerations include a possible intra osseous meningioma versus osseous metastasis. Neuro surgical consultation and referral for consideration of possible histologic sampling suggested for further evaluation. Additionally, postcontrast imaging could be performed for complete evaluation as clinically desired. 4. Underlying age-related cerebral atrophy with moderate chronic microvascular ischemic disease, with multiple remote lacunar infarcts about Ann bilateral basal ganglia and right thalamus. MRA HEAD IMPRESSION: 1. Motion degraded exam. 2. Negative intracranial MRA for large vessel occlusion. 3. 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 4. Additional scattered intracranial atherosclerotic irregularity, with no other hemodynamically significant or correctable stenosis identified.  Electronically Signed   By: Rise Mu M.D.   On: 05/30/2020 00:34   MR BRAIN WO CONTRAST  Result Date: 05/30/2020 CLINICAL DATA:  Initial evaluation for acute stroke, slurred speech, right-sided weakness. EXAM: MRI HEAD WITHOUT CONTRAST MRA HEAD WITHOUT CONTRAST TECHNIQUE: Multiplanar, multiecho pulse sequences of Ann brain and surrounding structures were obtained without intravenous contrast. Angiographic images of Ann head were obtained using MRA technique without contrast. COMPARISON:  Prior CT from earlier same day as well as  previous MRI from 03/04/2017. FINDINGS: MRI HEAD FINDINGS Brain: Examination significantly degraded by motion artifact. Diffuse prominence of Ann CSF containing spaces compatible generalized age-related cerebral atrophy. Patchy T2/FLAIR hyperintensity within Ann periventricular and deep white matter both cerebral hemispheres most consistent with chronic small vessel ischemic disease, moderate in nature. Multiple remote lacunar infarcts present about Ann bilateral basal ganglia and right thalamus. Subtle approximate 6 mm focus of diffusion abnormality seen involving Ann left paramedian pons, suspicious for an acute ischemic infarct (series 2, image 15). No visible associated hemorrhage or mass effect on this motion degraded exam. No other diffusion abnormality to suggest acute or subacute ischemia. Gray-white matter differentiation otherwise maintained. No other areas of remote cortical infarction. No foci of susceptibility artifact to suggest acute or chronic intracranial hemorrhage on this motion degraded exam. 2.8 cm mass involving Ann right frontal calvarium with extraosseous extension again seen, indeterminate. No associated mass effect on Ann subjacent right frontal lobe. No other mass lesion. No mass effect or midline shift. No hydrocephalus or extra-axial fluid collection. Pituitary gland grossly within normal limits. Midline structures intact. Vascular: Major intracranial vascular flow voids are maintained. Skull and upper cervical spine: Craniocervical junction within normal limits. Upper cervical spine normal. Bone marrow signal intensity within normal limits. No other focal marrow replacing lesions. Scalp soft tissues otherwise unremarkable. Sinuses/Orbits: Globes and orbital soft tissues within normal limits. Visualized paranasal sinuses are grossly clear. No significant mastoid effusion. Other: None. MRA HEAD FINDINGS ANTERIOR CIRCULATION: Examination moderately degraded by motion artifact. Visualized  distal cervical segments of Ann internal carotid arteries are patent with antegrade flow. Petrous segments widely patent. Scattered atheromatous irregularity throughout Ann cavernous/supraclinoid ICAs without hemodynamically significant stenosis. Right A1 segment widely patent. Left A1 hypoplastic and/or absent, accounting for Ann diminutive left ICA is compared to Ann right. Normal anterior communicating artery complex. Anterior cerebral arteries patent to their distal aspects without appreciable stenosis. M1 segments patent bilaterally. Grossly normal MCA bifurcations. Distal MCA branches well perfused and fairly symmetric. POSTERIOR CIRCULATION: Vertebral arteries patent to Ann vertebrobasilar junction without stenosis. Left vertebral artery slightly dominant. Neither PICA origin visualized. Basilar widely patent proximally. There is a short-segment moderate stenosis involving Ann mid basilar artery (series 2, image 91). Basilar otherwise patent to its distal aspect. Superior cerebral arteries grossly patent proximally. PCAs primarily supplied via Ann basilar, both of which are grossly patent to their distal aspects without definite stenosis, although evaluation limited by motion. No intracranial aneurysm. IMPRESSION: MRI HEAD IMPRESSION: 1. Motion degraded exam. 2. Subtle approximate 6 mm acute ischemic nonhemorrhagic left pontine infarct. 3. 2.8 cm mass involving Ann right frontal calvarium with extraosseous extension, indeterminate. In retrospect, this lesion appears to have been present on prior MRI from 2018, although is Otila larger on today's exam. Again, primary differential considerations include a possible intra osseous meningioma versus osseous metastasis. Neuro surgical consultation and referral for consideration of possible histologic sampling suggested for further evaluation. Additionally, postcontrast imaging could be performed for complete evaluation as clinically desired.  4. Underlying age-related  cerebral atrophy with moderate chronic microvascular ischemic disease, with multiple remote lacunar infarcts about Ann bilateral basal ganglia and right thalamus. MRA HEAD IMPRESSION: 1. Motion degraded exam. 2. Negative intracranial MRA for large vessel occlusion. 3. 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 4. Additional scattered intracranial atherosclerotic irregularity, with no other hemodynamically significant or correctable stenosis identified. Electronically Signed   By: Rise Mu M.D.   On: 05/30/2020 00:34   ECHOCARDIOGRAM COMPLETE  Result Date: 05/30/2020    ECHOCARDIOGRAM REPORT   Patient Name:   Ann Coleman    Date of Exam: 05/30/2020 Medical Rec #:  [id number]  Height:       59.0 in Accession #:    [id number] Weight:       151.5 lb Date of Birth:  September 02, 1924   BSA:          1.639 m Patient Age:    89 years   BP:           136/101 mmHg Patient Gender: F          HR:           75 bpm. Exam Location:  Inpatient Procedure: 2D Echo, Cardiac Doppler and Color Doppler Indications:    Stroke 434.91 / I163.9  History:        Patient has prior history of Echocardiogram examinations, most                 recent 03/06/2017. Risk Factors:Hypertension and Dyslipidemia.  Sonographer:    Elmarie Shiley Dance Referring Phys: [id number] VASUNDHRA RATHORE IMPRESSIONS  1. Intracavitary gradient. Peak velocity 2.1 m/s. Peak gradient 17.6 mmHg. Left ventricular ejection fraction, by estimation, is >75%. Ann left ventricle has hyperdynamic function. Ann left ventricle has no regional wall motion abnormalities. There is mild concentric left ventricular hypertrophy. Left ventricular diastolic parameters are consistent with Grade I diastolic dysfunction (impaired relaxation).  2. Right ventricular systolic function is normal. Ann right ventricular size is normal. There is normal pulmonary artery systolic pressure.  3. Left atrial size was mildly dilated.  4. Ann mitral valve is normal in structure. No  evidence of mitral valve regurgitation. No evidence of mitral stenosis.  5. Ann aortic valve is tricuspid. Aortic valve regurgitation is mild. No aortic stenosis is present.  6. Ann inferior vena cava is normal in size with greater than 50% respiratory variability, suggesting right atrial pressure of 3 mmHg. FINDINGS  Left Ventricle: Intracavitary gradient. Peak velocity 2.1 m/s. Peak gradient 17.6 mmHg. Left ventricular ejection fraction, by estimation, is >75%. Ann left ventricle has hyperdynamic function. Ann left ventricle has no regional wall motion abnormalities. Ann left ventricular internal cavity size was normal in size. There is mild concentric left ventricular hypertrophy. Left ventricular diastolic parameters are consistent with Grade I diastolic dysfunction (impaired relaxation). Right Ventricle: Ann right ventricular size is normal. No increase in right ventricular wall thickness. Right ventricular systolic function is normal. There is normal pulmonary artery systolic pressure. Ann tricuspid regurgitant velocity is 1.67 m/s, and  with an assumed right atrial pressure of 3 mmHg, Ann estimated right ventricular systolic pressure is 14.2 mmHg. Left Atrium: Left atrial size was mildly dilated. Right Atrium: Right atrial size was normal in size. Pericardium: A small pericardial effusion is present. There is no evidence of cardiac tamponade. Mitral Valve: Ann mitral valve is normal in structure. Normal mobility of Ann mitral valve leaflets. No evidence of mitral valve regurgitation. No evidence of mitral valve stenosis.  Tricuspid Valve: Ann tricuspid valve is normal in structure. Tricuspid valve regurgitation is trivial. No evidence of tricuspid stenosis. Aortic Valve: Ann aortic valve is tricuspid. Aortic valve regurgitation is mild. Aortic regurgitation PHT measures 300 msec. No aortic stenosis is present. Pulmonic Valve: Ann pulmonic valve was normal in structure. Pulmonic valve regurgitation is trivial.  No evidence of pulmonic stenosis. Aorta: Ann aortic root is normal in size and structure. Venous: Ann inferior vena cava is normal in size with greater than 50% respiratory variability, suggesting right atrial pressure of 3 mmHg. IAS/Shunts: No atrial level shunt detected by color flow Doppler.  LEFT VENTRICLE PLAX 2D LVIDd:         3.21 cm LVIDs:         2.24 cm LV PW:         1.26 cm LV IVS:        1.20 cm LVOT diam:     2.00 cm LV SV:         75 LV SV Index:   46 LVOT Area:     3.14 cm  RIGHT VENTRICLE            IVC RV Basal diam:  2.16 cm    IVC diam: 1.53 cm RV S prime:     9.03 cm/s TAPSE (M-mode): 1.9 cm LEFT ATRIUM             Index       RIGHT ATRIUM           Index LA diam:        4.20 cm 2.56 cm/m  RA Area:     12.50 cm LA Vol (A2C):   32.7 ml 19.95 ml/m RA Volume:   27.70 ml  16.90 ml/m LA Vol (A4C):   50.2 ml 30.63 ml/m LA Biplane Vol: 41.9 ml 25.57 ml/m  AORTIC VALVE LVOT Vmax:   101.00 cm/s LVOT Vmean:  75.100 cm/s LVOT VTI:    0.238 m AI PHT:      300 msec  AORTA Ao Root diam: 2.90 cm Ao Asc diam:  3.50 cm MITRAL VALVE                TRICUSPID VALVE MV Area (PHT): 2.50 cm     TR Peak grad:   11.2 mmHg MV Decel Time: 303 msec     TR Vmax:        167.00 cm/s MV E velocity: 69.20 cm/s MV A velocity: 127.00 cm/s  SHUNTS MV E/A ratio:  0.54         Systemic VTI:  0.24 m                             Systemic Diam: 2.00 cm Coleman Si MD Electronically signed by Coleman Si MD Signature Date/Time: 05/30/2020/12:19:32 PM    Final    CT HEAD CODE STROKE WO CONTRAST  Result Date: 05/29/2020 CLINICAL DATA:  Code stroke. Initial evaluation for acute slurred speech, leg weakness. EXAM: CT HEAD WITHOUT CONTRAST TECHNIQUE: Contiguous axial images were obtained from Ann base of Ann skull through Ann vertex without intravenous contrast. COMPARISON:  Prior study from 03/04/2017. FINDINGS: Brain: Generalized age-related cerebral atrophy with moderate chronic microvascular ischemic disease.  Scattered remote lacunar infarcts present about Ann bilateral basal ganglia and right internal capsule/thalamus. No acute intracranial hemorrhage. No acute large vessel territory infarct. No parenchymal mass lesion, mass effect, or midline shift. No hydrocephalus or extra-axial  fluid collection. There is a hyperdense ovoid lesion measuring approximately 2.5 cm positioned within Ann right frontal calvarium (series 3, image 18). Upon review of prior CT from 2018, this lesion was likely present, although faintly visible at that time. Associated lytic changes within Ann underlying calvarium with probable associated pathologic fracture (series 4, image 46). Soft tissue component extends into Ann overlying right frontal scalp as well. Vascular: No hyperdense vessel. Calcified atherosclerosis present at Ann skull base. Skull: 2.5 cm lytic lesion involving Ann right frontal calvarium as above. Calvarium otherwise intact. No other scalp soft tissue abnormality. Sinuses/Orbits: Globes and orbital soft tissues within normal limits. Scattered mucosal thickening noted within Ann visualized ethmoidal air cells. Visualized paranasal sinuses are otherwise clear. No mastoid effusion. Other: None. ASPECTS [facility] Stroke Program Early CT Score) - Ganglionic level infarction (caudate, lentiform nuclei, internal capsule, insula, M1-M3 cortex): 7 - Supraganglionic infarction (M4-M6 cortex): 3 Total score (0-10 with 10 being normal): 10 IMPRESSION: 1. No acute intracranial infarct or other abnormality. 2. ASPECTS is 10. 3. Approximate 2.5 cm lytic lesion involving Ann right frontal calvarium with probable associated pathologic fracture. Finding is nonspecific, although is suspected to have been present on previous CT from 2018, although only faintly visible at that time. Primary differential consideration consists of an intra osseous meningioma, although possible osseous metastasis not excluded. Further evaluation with dedicated brain  MRI, with and without contrast suggested for further evaluation. Additionally, neuro surgical consultation and referral for possible histologic sampling suggested. 4. Underlying age-related cerebral atrophy with moderate chronic microvascular ischemic disease, with multiple remote lacunar infarcts about Ann bilateral basal ganglia as above. Critical Value/emergent results were called by telephone at Ann time of interpretation on 05/29/2020 at 10:32 pm to provider [facility] , who verbally acknowledged these results. Electronically Signed   By: Rise Mu M.D.   On: 05/29/2020 22:36     Assessment/Plan: Diagnosis: Acute left pontine stroke 1. Does Ann need for close, 24 hr/day medical supervision in concert with Ann patient's rehab needs make it unreasonable for this patient to be served in a less intensive setting? Yes 2. Co-Morbidities requiring supervision/potential complications: hypertensive urgency, primary oral dysphagia, HLD, right sided flaccidity, prediabetes 3. Due to bladder management, bowel management, safety, skin/wound care, disease management, medication administration, pain management and patient education, does Ann patient require 24 hr/day rehab nursing? Yes 4. Does Ann patient require coordinated care of a physician, rehab nurse, therapy disciplines of PT, OT, SLP to address physical and functional deficits in Ann context of Ann above medical diagnosis(es)? Yes Addressing deficits in Ann following areas: balance, endurance, locomotion, strength, transferring, bowel/bladder control, bathing, dressing, feeding, grooming, toileting, swallowing and psychosocial support 5. Can Ann patient actively participate in an intensive therapy program of at least 3 hrs of therapy per day at least 5 days per week? Yes 6. Ann potential for patient to make measurable gains while on inpatient rehab is excellent 7. Anticipated functional outcomes upon discharge from inpatient rehab are mod  assist  with PT, mod assist with OT, min assist with SLP. 8. Estimated rehab length of stay to reach Ann above functional goals is: 10-14 days 9. Anticipated discharge destination: Home 10. Overall Rehab/Functional Prognosis: excellent  RECOMMENDATIONS: This patient's condition is appropriate for continued rehabilitative care in Ann following setting: CIR Patient has agreed to participate in recommended program. Yes Note that insurance prior authorization may be required for reimbursement for recommended care.  Comment: Mrs. Moster would be an excellent CIR candidate. Right  sided flaccidity is different from yesterday when she was moving arm well as per daughter and notes. Discussed with Dr. Rhona Leavens and stat MRI is being obtained now. She has spoke with Dr. Roda Shutters and given patient's age, conservative care will likely be recommended regardless of evolving infarct. Patient lives with her daughter. She was independent at baseline and was able to ambulate with a cane. She will likely require significant assistance from her daughter upon discharge. Thank you for this consult. We will continue to follow in Ms. Nieblas's care.  Mcarthur Rossetti Angiulli, PA-C 05/30/2020   I have personally performed a face to face diagnostic evaluation, including, but not limited to relevant history and physical exam findings, of this patient and developed relevant assessment and plan.  Additionally, I have reviewed and concur with Ann physician assistant's documentation above.  Sula Soda, MD

## 2020-05-30 NOTE — Progress Notes (Signed)
Rehab Admissions Coordinator Note:  Patient was screened by Clois Dupes for appropriateness for an Inpatient Acute Rehab Consult per PT recs. .  At this time, we are recommending Inpatient Rehab consult. I will place order for consult per protocol.  Clois Dupes RN MSN 05/30/2020, 3:23 PM  I can be reached at [phone].

## 2020-05-30 NOTE — Progress Notes (Signed)
PT Cancellation Note  Patient Details Name: Ann Coleman MRN: [id number] DOB: 21-Dec-1923   Cancelled Treatment:    Reason Eval/Treat Not Completed: Patient at procedure or test/unavailable. ECHO in progress. Will check back as schedule allows to initiate PT evaluation.    Marylynn Pearson 05/30/2020, 9:34 AM   Conni Slipper, PT, DPT Acute Rehabilitation Services Pager: [phone] Office: [phone]

## 2020-05-30 NOTE — Progress Notes (Signed)
PROGRESS NOTE    Ann Coleman  VQM:[id number] DOB: 07-12-1924 DOA: 05/29/2020 PCP: Patient, No Pcp Per    Brief Narrative:  86 y.o. [name] speaking female with medical history significant of stroke in 2018, hypertension, hyperlipidemia presenting to the ED via EMS as code stroke.  Daughter at bedside states patient was doing well during the day and around 4 PM today she could not get up to use the bathroom as her legs were weak.  Family also noticed that her speech was slurred.  When EMS arrived, they noticed that patient had right arm drift.  Daughter states patient is doing well otherwise and does not take any medications at home.  No complaints of fevers, cough, shortness of breath, chest pain, nausea, vomiting, abdominal pain, or diarrhea.  ED Course: Hypertensive with systolic in the 190s.  Stat Head CT negative for acute intracranial infarct.  Showing an approximate 2.5 cm lytic lesion involving the right frontal calvarium with possible associated pathologic fracture.  Finding is felt to be nonspecific, suspected to have been present on previous CT from 2018, although only faintly visible at that time.  Brain MRI recommended for further evaluation.  Patient was seen by neurology.  tPA not given as she was outside the window at the time of presentation.  Due to focal symptoms, stat brain MRI was ordered and reviewed by neurology -showing an acute left pontine stroke which is felt to be the likely explanation for her right-sided weakness.  Assessment & Plan:   Principal Problem:   Acute CVA (cerebrovascular accident) (HCC) Active Problems:   Hypertensive urgency   HLD (hyperlipidemia)   Acute CVA:  -Presented with slurred speech, lower extremity weakness, and right arm drift.  tPA was not given as she was outside the window at the time of presentation.  Head CT negative for acute intracranial infarct.  Brain MRI reviewed, confirmed acute left pontine stroke.  -2D echocardiogram with normal  LVEF -Hemoglobin A1c pending -LDL of 136 -Per Neurology, plan for aspirin 81mg  with plavix x 3 weeks, then ASA alone -Atorvastatin 40 mg daily -Frequent neurochecks -PT, OT, speech therapy. -Currently on dysphagia 1 diet. Discussed with SLP. Concerns for dysphagia, recommendation for swallow eval tomorrow -Therapy recs for CIR -Pt seen with Neurology at bedside  Right calvarial lytic lesion:  -Showing an approximate 2.5 cm lytic lesion involving the right frontal calvarium with possible associated pathologic fracture.  Finding is felt to be nonspecific, suspected to have been present on previous CT from 2018, although only faintly visible at that time.   -discussed with Neurology, recommendation to have this followed up as outpatient after discharge  Hypertensive urgency: Patient is not taking any antihypertensives at home.  Blood pressure significantly elevated on arrival with systolic in the 190s.  Systolic now down to 130s. -Plan to gradually normalize BP over next 24hrs  Hyperlipidemia:  -Start high intensity statin given acute stroke per above  DVT prophylaxis: Lovenox subq Code Status: DNR Family Communication: Pt in room, daughter at bedside  Status is: Inpatient  Remains inpatient appropriate because:Ongoing diagnostic testing needed not appropriate for outpatient work up   Dispo: The patient is from: Home              Anticipated d/c is to: Home vs CIR              Anticipated d/c date is: 1 day              Patient currently is not medically  stable to d/c.    Consultants:   Neurology  Procedures:     Antimicrobials: Anti-infectives (From admission, onward)   None       Subjective: Through translator, complaining of arm pain caused by blood pressure cuff  Objective: Vitals:   05/30/20 0707 05/30/20 0900 05/30/20 1100 05/30/20 1610  BP: (!) 136/101 (!) 172/82 (!) 181/85 (!) 165/72  Pulse: 83 72 61 79  Resp: 19 18 18 18   Temp: 98.6 F (37 C)  98.6 F (37 C) 98.7 F (37.1 C) 98.3 F (36.8 C)  TempSrc: Oral Oral Oral Oral  SpO2: 92% 95% 98% 98%  Weight:      Height:        Intake/Output Summary (Last 24 hours) at 05/30/2020 1639 Last data filed at 05/30/2020 1140 Gross per 24 hour  Intake 240 ml  Output 375 ml  Net -135 ml   Filed Weights   05/29/20 2146 05/30/20 0107  Weight: 64 kg 68.7 kg    Examination:  General exam: Appears calm and comfortable  Respiratory system: Clear to auscultation. Respiratory effort normal. Cardiovascular system: S1 & S2 heard, Regular Gastrointestinal system: Abdomen is nondistended, soft and nontender. No organomegaly or masses felt. Normal bowel sounds heard. Central nervous system: Alert and oriented. No focal neurological deficits. Extremities: Symmetric 5 x 5 power. Skin: No rashes, lesions  Psychiatry: Judgement and insight appear normal. Mood & affect appropriate.   Data Reviewed: I have personally reviewed following labs and imaging studies  CBC: Recent Labs  Lab 05/29/20 2143 05/29/20 2150  WBC  --  7.0  NEUTROABS  --  3.0  HGB 13.3 12.6  HCT 39.0 40.8  MCV  --  83.8  PLT  --  212   Basic Metabolic Panel: Recent Labs  Lab 05/29/20 2143 05/29/20 2150  NA 140 137  K 3.5 3.5  CL 101 103  CO2  --  24  GLUCOSE 95 100*  BUN 11 5*  CREATININE 0.90 1.03*  CALCIUM  --  8.5*   GFR: Estimated Creatinine Clearance: 28.2 mL/min (A) (by C-G formula based on SCr of 1.03 mg/dL (H)). Liver Function Tests: Recent Labs  Lab 05/29/20 2150  AST 35  ALT 27  ALKPHOS 72  BILITOT 0.8  PROT 7.2  ALBUMIN 3.2*   No results for input(s): LIPASE, AMYLASE in the last 168 hours. No results for input(s): AMMONIA in the last 168 hours. Coagulation Profile: Recent Labs  Lab 05/29/20 2150  INR 1.1   Cardiac Enzymes: No results for input(s): CKTOTAL, CKMB, CKMBINDEX, TROPONINI in the last 168 hours. BNP (last 3 results) No results for input(s): PROBNP in the last 8760  hours. HbA1C: No results for input(s): HGBA1C in the last 72 hours. CBG: No results for input(s): GLUCAP in the last 168 hours. Lipid Profile: Recent Labs    05/30/20 0425  CHOL 204*  HDL 40*  LDLCALC 136*  TRIG 139  CHOLHDL 5.1   Thyroid Function Tests: No results for input(s): TSH, T4TOTAL, FREET4, T3FREE, THYROIDAB in the last 72 hours. Anemia Panel: No results for input(s): VITAMINB12, FOLATE, FERRITIN, TIBC, IRON, RETICCTPCT in the last 72 hours. Sepsis Labs: No results for input(s): PROCALCITON, LATICACIDVEN in the last 168 hours.  Recent Results (from the past 240 hour(s))  SARS Coronavirus 2 by RT PCR (hospital order, performed in [facility] hospital lab) Nasopharyngeal Nasopharyngeal Swab     Status: None   Collection Time: 05/29/20 10:55 PM   Specimen: Nasopharyngeal Swab  Result Value Ref Range Status   SARS Coronavirus 2 NEGATIVE NEGATIVE Final    Comment: (NOTE) SARS-CoV-2 target nucleic acids are NOT DETECTED.  The SARS-CoV-2 RNA is generally detectable in upper and lower respiratory specimens during the acute phase of infection. The lowest concentration of SARS-CoV-2 viral copies this assay can detect is 250 copies / mL. A negative result does not preclude SARS-CoV-2 infection and should not be used as the sole basis for treatment or other patient management decisions.  A negative result may occur with improper specimen collection / handling, submission of specimen other than nasopharyngeal swab, presence of viral mutation(s) within the areas targeted by this assay, and inadequate number of viral copies (<250 copies / mL). A negative result must be combined with clinical observations, patient history, and epidemiological information.  Fact Sheet for Patients:   BoilerBrush.com.cy  Fact Sheet for Healthcare Providers: https://pope.com/  This test is not yet approved or  cleared by the [place] FDA  and has been authorized for detection and/or diagnosis of SARS-CoV-2 by FDA under an Emergency Use Authorization (EUA).  This EUA will remain in effect (meaning this test can be used) for the duration of the COVID-19 declaration under Section 564(b)(1) of the Act, 21 U.S.C. section 360bbb-3(b)(1), unless the authorization is terminated or revoked sooner.  Performed at [facility], [address], [place], [place] [address]      Radiology Studies: CT ANGIO HEAD W OR WO CONTRAST  Addendum Date: 05/30/2020   ADDENDUM REPORT: 05/30/2020 13:23 ADDENDUM: Enlarged heterogeneous thyroid gland. Largest single nodule 2 cm. recommend thyroid ultrasound (ref: J Am Coll Radiol. 2015 Feb;12(2): 143-50). Electronically Signed   By: Paulina Fusi M.D.   On: 05/30/2020 13:23   Result Date: 05/30/2020 CLINICAL DATA:  Acute presentation with slurred speech and leg weakness. Small left pontine infarction suspected by MRI. EXAM: CT ANGIOGRAPHY HEAD AND NECK TECHNIQUE: Multidetector CT imaging of the head and neck was performed using the standard protocol during bolus administration of intravenous contrast. Multiplanar CT image reconstructions and MIPs were obtained to evaluate the vascular anatomy. Carotid stenosis measurements (when applicable) are obtained utilizing NASCET criteria, using the distal internal carotid diameter as the denominator. CONTRAST:  75mL OMNIPAQUE IOHEXOL 350 MG/ML SOLN COMPARISON:  MR studies done yesterday. FINDINGS: CTA NECK FINDINGS Aortic arch: Aortic atherosclerosis. No aneurysm or brachiocephalic vessel origin stenosis. 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 Right carotid system: Common carotid artery is widely patent to the bifurcation region. Atherosclerotic plaque at carotid bifurcation and ICA bulb but without stenosis compared to the more distal cervical ICA diameter. Left carotid system: Common carotid artery widely patent to the  bifurcation. Atherosclerotic plaque at the carotid bifurcation and ICA bulb but without stenosis compared to the more distal cervical ICA diameter. Vertebral arteries: Both vertebral artery origins are widely patent. Both vertebral arteries are widely patent through the cervical region to the foramen magnum. Skeleton: Ordinary cervical spondylosis. Right frontal calvarial mass as described at previous MRI. Other neck: No lymphadenopathy. Enlarged heterogeneous thyroid gland with multiple nodules, the largest measuring 2.1 cm. Upper chest: Negative Review of the MIP images confirms the above findings CTA HEAD FINDINGS Anterior circulation: Both internal carotid arteries are patent through the skull base and siphon regions. Ordinary siphon atherosclerotic calcification but without stenosis greater than 30%. The anterior and middle cerebral vessels are patent without correctable proximal stenosis. The anterior temporal branch of the MCA on the  left appears to be occluded. Posterior circulation: Both vertebral arteries are widely patent to the basilar. Mild atherosclerotic irregularity of the basilar artery with distal basilar narrowing of about 30%. Superior cerebellar and posterior cerebral arteries are patent. Distal PCA branch vessels show atherosclerotic irregularity. Venous sinuses: Patent and normal. Anatomic variants: None other significant. Review of the MIP images confirms the above findings IMPRESSION: Aortic atherosclerosis. 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 Atherosclerotic change at both carotid bifurcations but no stenosis. Atherosclerotic change of the carotid siphon regions but without stenosis greater than 30%. 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 This is probably chronic, similar to the MR angiography of 2018. Distal vessel atherosclerotic narrowing and irregularity of the MCA and PCA branches.  Atherosclerotic irregularity of the basilar artery, maximal stenosis in the distal basilar region of approximately 30%. Right frontal calvarial lytic mass as described by MRI. Differential diagnosis is primarily metastatic disease versus myeloma versus intra osseous meningioma versus enlarging hemangioma. Electronically Signed: By: Paulina Fusi M.D. On: 05/30/2020 12:02   CT ANGIO NECK W OR WO CONTRAST  Addendum Date: 05/30/2020   ADDENDUM REPORT: 05/30/2020 13:23 ADDENDUM: Enlarged heterogeneous thyroid gland. Largest single nodule 2 cm. recommend thyroid ultrasound (ref: J Am Coll Radiol. 2015 Feb;12(2): 143-50). Electronically Signed   By: Paulina Fusi M.D.   On: 05/30/2020 13:23   Result Date: 05/30/2020 CLINICAL DATA:  Acute presentation with slurred speech and leg weakness. Small left pontine infarction suspected by MRI. EXAM: CT ANGIOGRAPHY HEAD AND NECK TECHNIQUE: Multidetector CT imaging of the head and neck was performed using the standard protocol during bolus administration of intravenous contrast. Multiplanar CT image reconstructions and MIPs were obtained to evaluate the vascular anatomy. Carotid stenosis measurements (when applicable) are obtained utilizing NASCET criteria, using the distal internal carotid diameter as the denominator. CONTRAST:  75mL OMNIPAQUE IOHEXOL 350 MG/ML SOLN COMPARISON:  MR studies done yesterday. FINDINGS: CTA NECK FINDINGS Aortic arch: Aortic atherosclerosis. No aneurysm or brachiocephalic vessel origin stenosis. 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 Right carotid system: Common carotid artery is widely patent to the bifurcation region. Atherosclerotic plaque at carotid bifurcation and ICA bulb but without stenosis compared to the more distal cervical ICA diameter. Left carotid system: Common carotid artery widely patent to the bifurcation. Atherosclerotic plaque at the carotid bifurcation and ICA bulb but without stenosis  compared to the more distal cervical ICA diameter. Vertebral arteries: Both vertebral artery origins are widely patent. Both vertebral arteries are widely patent through the cervical region to the foramen magnum. Skeleton: Ordinary cervical spondylosis. Right frontal calvarial mass as described at previous MRI. Other neck: No lymphadenopathy. Enlarged heterogeneous thyroid gland with multiple nodules, the largest measuring 2.1 cm. Upper chest: Negative Review of the MIP images confirms the above findings CTA HEAD FINDINGS Anterior circulation: Both internal carotid arteries are patent through the skull base and siphon regions. Ordinary siphon atherosclerotic calcification but without stenosis greater than 30%. The anterior and middle cerebral vessels are patent without correctable proximal stenosis. The anterior temporal branch of the MCA on the left appears to be occluded. Posterior circulation: Both vertebral arteries are widely patent to the basilar. Mild atherosclerotic irregularity of the basilar artery with distal basilar narrowing of about 30%. Superior cerebellar and posterior cerebral arteries are patent. Distal PCA branch vessels show atherosclerotic irregularity. Venous sinuses: Patent and normal. Anatomic variants: None other  significant. Review of the MIP images confirms the above findings IMPRESSION: Aortic atherosclerosis. 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 Atherosclerotic change at both carotid bifurcations but no stenosis. Atherosclerotic change of the carotid siphon regions but without stenosis greater than 30%. 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 This is probably chronic, similar to the MR angiography of 2018. Distal vessel atherosclerotic narrowing and irregularity of the MCA and PCA branches. Atherosclerotic irregularity of the basilar artery, maximal stenosis in the distal basilar region of  approximately 30%. Right frontal calvarial lytic mass as described by MRI. Differential diagnosis is primarily metastatic disease versus myeloma versus intra osseous meningioma versus enlarging hemangioma. Electronically Signed: By: Paulina FusiMark  Shogry M.D. On: 05/30/2020 12:02   MR ANGIO HEAD WO CONTRAST  Result Date: 05/30/2020 CLINICAL DATA:  Initial evaluation for acute stroke, slurred speech, right-sided weakness. EXAM: MRI HEAD WITHOUT CONTRAST MRA HEAD WITHOUT CONTRAST TECHNIQUE: Multiplanar, multiecho pulse sequences of the brain and surrounding structures were obtained without intravenous contrast. Angiographic images of the head were obtained using MRA technique without contrast. COMPARISON:  Prior CT from earlier same day as well as previous MRI from 03/04/2017. FINDINGS: MRI HEAD FINDINGS Brain: Examination significantly degraded by motion artifact. Diffuse prominence of the CSF containing spaces compatible generalized age-related cerebral atrophy. Patchy T2/FLAIR hyperintensity within the periventricular and deep white matter both cerebral hemispheres most consistent with chronic small vessel ischemic disease, moderate in nature. Multiple remote lacunar infarcts present about the bilateral basal ganglia and right thalamus. Subtle approximate 6 mm focus of diffusion abnormality seen involving the left paramedian pons, suspicious for an acute ischemic infarct (series 2, image 15). No visible associated hemorrhage or mass effect on this motion degraded exam. No other diffusion abnormality to suggest acute or subacute ischemia. Gray-white matter differentiation otherwise maintained. No other areas of remote cortical infarction. No foci of susceptibility artifact to suggest acute or chronic intracranial hemorrhage on this motion degraded exam. 2.8 cm mass involving the right frontal calvarium with extraosseous extension again seen, indeterminate. No associated mass effect on the subjacent right frontal lobe. No  other mass lesion. No mass effect or midline shift. No hydrocephalus or extra-axial fluid collection. Pituitary gland grossly within normal limits. Midline structures intact. Vascular: Major intracranial vascular flow voids are maintained. Skull and upper cervical spine: Craniocervical junction within normal limits. Upper cervical spine normal. Bone marrow signal intensity within normal limits. No other focal marrow replacing lesions. Scalp soft tissues otherwise unremarkable. Sinuses/Orbits: Globes and orbital soft tissues within normal limits. Visualized paranasal sinuses are grossly clear. No significant mastoid effusion. Other: None. MRA HEAD FINDINGS ANTERIOR CIRCULATION: Examination moderately degraded by motion artifact. Visualized distal cervical segments of the internal carotid arteries are patent with antegrade flow. Petrous segments widely patent. Scattered atheromatous irregularity throughout the cavernous/supraclinoid ICAs without hemodynamically significant stenosis. Right A1 segment widely patent. Left A1 hypoplastic and/or absent, accounting for the diminutive left ICA is compared to the right. Normal anterior communicating artery complex. Anterior cerebral arteries patent to their distal aspects without appreciable stenosis. M1 segments patent bilaterally. Grossly normal MCA bifurcations. Distal MCA branches well perfused and fairly symmetric. POSTERIOR CIRCULATION: Vertebral arteries patent to the vertebrobasilar junction without stenosis. Left vertebral artery slightly dominant. Neither PICA origin visualized. Basilar widely patent proximally. There is a short-segment moderate stenosis involving the mid basilar artery (series 2, image 91). Basilar otherwise patent to its distal aspect. Superior cerebral arteries grossly  patent proximally. PCAs primarily supplied via the basilar, both of which are grossly patent to their distal aspects without definite stenosis, although evaluation limited by  motion. No intracranial aneurysm. IMPRESSION: MRI HEAD IMPRESSION: 1. Motion degraded exam. 2. Subtle approximate 6 mm acute ischemic nonhemorrhagic left pontine infarct. 3. 2.8 cm mass involving the right frontal calvarium with extraosseous extension, indeterminate. In retrospect, this lesion appears to have been present on prior MRI from 2018, although is [place] larger on today's exam. Again, primary differential considerations include a possible intra osseous meningioma versus osseous metastasis. Neuro surgical consultation and referral for consideration of possible histologic sampling suggested for further evaluation. Additionally, postcontrast imaging could be performed for complete evaluation as clinically desired. 4. Underlying age-related cerebral atrophy with moderate chronic microvascular ischemic disease, with multiple remote lacunar infarcts about the bilateral basal ganglia and right thalamus. MRA HEAD IMPRESSION: 1. Motion degraded exam. 2. Negative intracranial MRA for large vessel occlusion. 3. 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 4. Additional scattered intracranial atherosclerotic irregularity, with no other hemodynamically significant or correctable stenosis identified. Electronically Signed   By: Rise Mu M.D.   On: 05/30/2020 00:34   MR BRAIN WO CONTRAST  Result Date: 05/30/2020 CLINICAL DATA:  Initial evaluation for acute stroke, slurred speech, right-sided weakness. EXAM: MRI HEAD WITHOUT CONTRAST MRA HEAD WITHOUT CONTRAST TECHNIQUE: Multiplanar, multiecho pulse sequences of the brain and surrounding structures were obtained without intravenous contrast. Angiographic images of the head were obtained using MRA technique without contrast. COMPARISON:  Prior CT from earlier same day as well as previous MRI from 03/04/2017. FINDINGS: MRI HEAD FINDINGS Brain: Examination significantly degraded by motion artifact. Diffuse prominence of the CSF containing  spaces compatible generalized age-related cerebral atrophy. Patchy T2/FLAIR hyperintensity within the periventricular and deep white matter both cerebral hemispheres most consistent with chronic small vessel ischemic disease, moderate in nature. Multiple remote lacunar infarcts present about the bilateral basal ganglia and right thalamus. Subtle approximate 6 mm focus of diffusion abnormality seen involving the left paramedian pons, suspicious for an acute ischemic infarct (series 2, image 15). No visible associated hemorrhage or mass effect on this motion degraded exam. No other diffusion abnormality to suggest acute or subacute ischemia. Gray-white matter differentiation otherwise maintained. No other areas of remote cortical infarction. No foci of susceptibility artifact to suggest acute or chronic intracranial hemorrhage on this motion degraded exam. 2.8 cm mass involving the right frontal calvarium with extraosseous extension again seen, indeterminate. No associated mass effect on the subjacent right frontal lobe. No other mass lesion. No mass effect or midline shift. No hydrocephalus or extra-axial fluid collection. Pituitary gland grossly within normal limits. Midline structures intact. Vascular: Major intracranial vascular flow voids are maintained. Skull and upper cervical spine: Craniocervical junction within normal limits. Upper cervical spine normal. Bone marrow signal intensity within normal limits. No other focal marrow replacing lesions. Scalp soft tissues otherwise unremarkable. Sinuses/Orbits: Globes and orbital soft tissues within normal limits. Visualized paranasal sinuses are grossly clear. No significant mastoid effusion. Other: None. MRA HEAD FINDINGS ANTERIOR CIRCULATION: Examination moderately degraded by motion artifact. Visualized distal cervical segments of the internal carotid arteries are patent with antegrade flow. Petrous segments widely patent. Scattered atheromatous irregularity  throughout the cavernous/supraclinoid ICAs without hemodynamically significant stenosis. Right A1 segment widely patent. Left A1 hypoplastic and/or absent, accounting for the diminutive left ICA is compared to the right. Normal anterior communicating artery complex. Anterior cerebral arteries patent to their distal aspects without appreciable stenosis. M1 segments  patent bilaterally. Grossly normal MCA bifurcations. Distal MCA branches well perfused and fairly symmetric. POSTERIOR CIRCULATION: Vertebral arteries patent to the vertebrobasilar junction without stenosis. Left vertebral artery slightly dominant. Neither PICA origin visualized. Basilar widely patent proximally. There is a short-segment moderate stenosis involving the mid basilar artery (series 2, image 91). Basilar otherwise patent to its distal aspect. Superior cerebral arteries grossly patent proximally. PCAs primarily supplied via the basilar, both of which are grossly patent to their distal aspects without definite stenosis, although evaluation limited by motion. No intracranial aneurysm. IMPRESSION: MRI HEAD IMPRESSION: 1. Motion degraded exam. 2. Subtle approximate 6 mm acute ischemic nonhemorrhagic left pontine infarct. 3. 2.8 cm mass involving the right frontal calvarium with extraosseous extension, indeterminate. In retrospect, this lesion appears to have been present on prior MRI from 2018, although is Kanisha larger on today's exam. Again, primary differential considerations include a possible intra osseous meningioma versus osseous metastasis. Neuro surgical consultation and referral for consideration of possible histologic sampling suggested for further evaluation. Additionally, postcontrast imaging could be performed for complete evaluation as clinically desired. 4. Underlying age-related cerebral atrophy with moderate chronic microvascular ischemic disease, with multiple remote lacunar infarcts about the bilateral basal ganglia and right  thalamus. MRA HEAD IMPRESSION: 1. Motion degraded exam. 2. Negative intracranial MRA for large vessel occlusion. 3. 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 4. Additional scattered intracranial atherosclerotic irregularity, with no other hemodynamically significant or correctable stenosis identified. Electronically Signed   By: Rise Mu M.D.   On: 05/30/2020 00:34   ECHOCARDIOGRAM COMPLETE  Result Date: 05/30/2020    ECHOCARDIOGRAM REPORT   Patient Name:   [facility] Loser    Date of Exam: 05/30/2020 Medical Rec #:  [id number]  Height:       59.0 in Accession #:    [id number] Weight:       151.5 lb Date of Birth:  [birth year]   BSA:          1.639 m Patient Age:    89 years   BP:           136/101 mmHg Patient Gender: F          HR:           75 bpm. Exam Location:  Inpatient Procedure: 2D Echo, Cardiac Doppler and Color Doppler Indications:    Stroke 434.91 / I163.9  History:        Patient has prior history of Echocardiogram examinations, most                 recent 03/06/2017. Risk Factors:Hypertension and Dyslipidemia.  Sonographer:    Elmarie Shiley Dance Referring Phys: [id number] VASUNDHRA RATHORE IMPRESSIONS  1. Intracavitary gradient. Peak velocity 2.1 m/s. Peak gradient 17.6 mmHg. Left ventricular ejection fraction, by estimation, is >75%. The left ventricle has hyperdynamic function. The left ventricle has no regional wall motion abnormalities. There is mild concentric left ventricular hypertrophy. Left ventricular diastolic parameters are consistent with Grade I diastolic dysfunction (impaired relaxation).  2. Right ventricular systolic function is normal. The right ventricular size is normal. There is normal pulmonary artery systolic pressure.  3. Left atrial size was mildly dilated.  4. The mitral valve is normal in structure. No evidence of mitral valve regurgitation. No evidence of mitral stenosis.  5. The aortic valve is tricuspid. Aortic valve regurgitation is mild. No aortic  stenosis is present.  6. The inferior vena cava is normal in size with greater than 50% respiratory variability, suggesting right  atrial pressure of 3 mmHg. FINDINGS  Left Ventricle: Intracavitary gradient. Peak velocity 2.1 m/s. Peak gradient 17.6 mmHg. Left ventricular ejection fraction, by estimation, is >75%. The left ventricle has hyperdynamic function. The left ventricle has no regional wall motion abnormalities. The left ventricular internal cavity size was normal in size. There is mild concentric left ventricular hypertrophy. Left ventricular diastolic parameters are consistent with Grade I diastolic dysfunction (impaired relaxation). Right Ventricle: The right ventricular size is normal. No increase in right ventricular wall thickness. Right ventricular systolic function is normal. There is normal pulmonary artery systolic pressure. The tricuspid regurgitant velocity is 1.67 m/s, and  with an assumed right atrial pressure of 3 mmHg, the estimated right ventricular systolic pressure is 14.2 mmHg. Left Atrium: Left atrial size was mildly dilated. Right Atrium: Right atrial size was normal in size. Pericardium: A small pericardial effusion is present. There is no evidence of cardiac tamponade. Mitral Valve: The mitral valve is normal in structure. Normal mobility of the mitral valve leaflets. No evidence of mitral valve regurgitation. No evidence of mitral valve stenosis. Tricuspid Valve: The tricuspid valve is normal in structure. Tricuspid valve regurgitation is trivial. No evidence of tricuspid stenosis. Aortic Valve: The aortic valve is tricuspid. Aortic valve regurgitation is mild. Aortic regurgitation PHT measures 300 msec. No aortic stenosis is present. Pulmonic Valve: The pulmonic valve was normal in structure. Pulmonic valve regurgitation is trivial. No evidence of pulmonic stenosis. Aorta: The aortic root is normal in size and structure. Venous: The inferior vena cava is normal in size with greater  than 50% respiratory variability, suggesting right atrial pressure of 3 mmHg. IAS/Shunts: No atrial level shunt detected by color flow Doppler.  LEFT VENTRICLE PLAX 2D LVIDd:         3.21 cm LVIDs:         2.24 cm LV PW:         1.26 cm LV IVS:        1.20 cm LVOT diam:     2.00 cm LV SV:         75 LV SV Index:   46 LVOT Area:     3.14 cm  RIGHT VENTRICLE            IVC RV Basal diam:  2.16 cm    IVC diam: 1.53 cm RV S prime:     9.03 cm/s TAPSE (M-mode): 1.9 cm LEFT ATRIUM             Index       RIGHT ATRIUM           Index LA diam:        4.20 cm 2.56 cm/m  RA Area:     12.50 cm LA Vol (A2C):   32.7 ml 19.95 ml/m RA Volume:   27.70 ml  16.90 ml/m LA Vol (A4C):   50.2 ml 30.63 ml/m LA Biplane Vol: 41.9 ml 25.57 ml/m  AORTIC VALVE LVOT Vmax:   101.00 cm/s LVOT Vmean:  75.100 cm/s LVOT VTI:    0.238 m AI PHT:      300 msec  AORTA Ao Root diam: 2.90 cm Ao Asc diam:  3.50 cm MITRAL VALVE                TRICUSPID VALVE MV Area (PHT): 2.50 cm     TR Peak grad:   11.2 mmHg MV Decel Time: 303 msec     TR Vmax:  167.00 cm/s MV E velocity: 69.20 cm/s MV A velocity: 127.00 cm/s  SHUNTS MV E/A ratio:  0.54         Systemic VTI:  0.24 m                             Systemic Diam: 2.00 cm Chilton Si MD Electronically signed by Chilton Si MD Signature Date/Time: 05/30/2020/12:19:32 PM    Final    CT HEAD CODE STROKE WO CONTRAST  Result Date: 05/29/2020 CLINICAL DATA:  Code stroke. Initial evaluation for acute slurred speech, leg weakness. EXAM: CT HEAD WITHOUT CONTRAST TECHNIQUE: Contiguous axial images were obtained from the base of the skull through the vertex without intravenous contrast. COMPARISON:  Prior study from 03/04/2017. FINDINGS: Brain: Generalized age-related cerebral atrophy with moderate chronic microvascular ischemic disease. Scattered remote lacunar infarcts present about the bilateral basal ganglia and right internal capsule/thalamus. No acute intracranial hemorrhage. No acute  large vessel territory infarct. No parenchymal mass lesion, mass effect, or midline shift. No hydrocephalus or extra-axial fluid collection. There is a hyperdense ovoid lesion measuring approximately 2.5 cm positioned within the right frontal calvarium (series 3, image 18). Upon review of prior CT from 2018, this lesion was likely present, although faintly visible at that time. Associated lytic changes within the underlying calvarium with probable associated pathologic fracture (series 4, image 46). Soft tissue component extends into the overlying right frontal scalp as well. Vascular: No hyperdense vessel. Calcified atherosclerosis present at the skull base. Skull: 2.5 cm lytic lesion involving the right frontal calvarium as above. Calvarium otherwise intact. No other scalp soft tissue abnormality. Sinuses/Orbits: Globes and orbital soft tissues within normal limits. Scattered mucosal thickening noted within the visualized ethmoidal air cells. Visualized paranasal sinuses are otherwise clear. No mastoid effusion. Other: None. ASPECTS [facility] Stroke Program Early CT Score) - Ganglionic level infarction (caudate, lentiform nuclei, internal capsule, insula, M1-M3 cortex): 7 - Supraganglionic infarction (M4-M6 cortex): 3 Total score (0-10 with 10 being normal): 10 IMPRESSION: 1. No acute intracranial infarct or other abnormality. 2. ASPECTS is 10. 3. Approximate 2.5 cm lytic lesion involving the right frontal calvarium with probable associated pathologic fracture. Finding is nonspecific, although is suspected to have been present on previous CT from 2018, although only faintly visible at that time. Primary differential consideration consists of an intra osseous meningioma, although possible osseous metastasis not excluded. Further evaluation with dedicated brain MRI, with and without contrast suggested for further evaluation. Additionally, neuro surgical consultation and referral for possible histologic sampling  suggested. 4. Underlying age-related cerebral atrophy with moderate chronic microvascular ischemic disease, with multiple remote lacunar infarcts about the bilateral basal ganglia as above. Critical Value/emergent results were called by telephone at the time of interpretation on 05/29/2020 at 10:32 pm to provider [facility] , who verbally acknowledged these results. Electronically Signed   By: Rise Mu M.D.   On: 05/29/2020 22:36    Scheduled Meds:  [START ON 05/31/2020] aspirin EC  81 mg Oral Daily   atorvastatin  40 mg Oral Daily   clopidogrel  75 mg Oral Daily   enoxaparin (LOVENOX) injection  30 mg Subcutaneous Q24H   Continuous Infusions:   LOS: 1 day   Rickey Barbara, MD [facility] Pager On Amion  If 7PM-7AM, please contact night-coverage 05/30/2020, 4:39 PM

## 2020-05-30 NOTE — Evaluation (Signed)
Clinical/Bedside Swallow Evaluation Patient Details  Name: Ann Coleman MRN: [id number] Date of Birth: 09/11/1924  Today's Date: 05/30/2020 Time: SLP Start Time (ACUTE ONLY): 1045 SLP Stop Time (ACUTE ONLY): 1115 SLP Time Calculation (min) (ACUTE ONLY): 30 min  Past Medical History:  Past Medical History:  Diagnosis Date  . High cholesterol   . Hypertension   . Stroke [facility])    Past Surgical History:  Past Surgical History:  Procedure Laterality Date  . NO PAST SURGERIES     HPI:  Ann Coleman is a 86 y.o. [name] speaking female with medical history significant of stroke in 2018, hypertension, hyperlipidemia.  Daughter reported pt could not get up to use the bathroom as her legs were weak.  Family also noticed that her speech was slurred.  When EMS arrived, they noticed that patient had right arm drift.  stat brain MRI was ordered and reviewed by neurology -showing an acute left pontine stroke which is felt to be the likely explanation for her right-sided weakness. Pt passed [facility] in ED, but struggled taking a pill later.    Assessment / Plan / Recommendation Clinical Impression  Pt demonstrates signs of a mild oral dysphagia with R CN VII weakness and decreased strength of labial seal and buccal tension for intraoral pressure for a straw, etc. Regardless with extra effort pt is able to take straw sips. There is a subjectively late laryngeal elevation, and instances of imemdiate cough. Pt has potential for sensorimotor deficits impacting swallow. MBS recommended, in the meantime, pt is able to drink nectar thick liquids and consume hopital purees of soft, mushy rice from home. Pill ok in puree.  SLP Visit Diagnosis: Dysphagia, oropharyngeal phase (R13.12)    Aspiration Risk  Moderate aspiration risk    Diet Recommendation Dysphagia 1 (Puree);Nectar-thick liquid   Liquid Administration via: Cup;Straw Medication Administration: Whole meds with puree Supervision: Full supervision/cueing for  compensatory strategies;Staff to assist with self feeding Compensations: Slow rate;Small sips/bites Postural Changes: Seated upright at 90 degrees    Other  Recommendations Oral Care Recommendations: Oral care BID   Follow up Recommendations        Frequency and Duration min 2x/week  2 weeks       Prognosis        Swallow Study   General HPI: Ann Coleman is a 86 y.o. [name] speaking female with medical history significant of stroke in 2018, hypertension, hyperlipidemia.  Daughter reported pt could not get up to use the bathroom as her legs were weak.  Family also noticed that her speech was slurred.  When EMS arrived, they noticed that patient had right arm drift.  stat brain MRI was ordered and reviewed by neurology -showing an acute left pontine stroke which is felt to be the likely explanation for her right-sided weakness. Pt passed [facility] in ED, but struggled taking a pill later.  Type of Study: Bedside Swallow Evaluation Diet Prior to this Study: NPO Temperature Spikes Noted: No Respiratory Status: Room air History of Recent Intubation: No Behavior/Cognition: Alert;Cooperative Patient Positioning: Upright in bed Baseline Vocal Quality: Normal Volitional Cough: Strong Volitional Swallow: Able to elicit    Oral/Motor/Sensory Function Overall Oral Motor/Sensory Function: Moderate impairment Facial ROM: Reduced right;Suspected CN VII (facial) dysfunction Facial Symmetry: Abnormal symmetry right;Suspected CN VII (facial) dysfunction Facial Strength: Reduced right;Suspected CN VII (facial) dysfunction Lingual ROM: Within Functional Limits Lingual Symmetry: Within Functional Limits Lingual Strength: Within Functional Limits Lingual Sensation: Within Functional Limits   Ice Chips  Thin Liquid Thin Liquid: Impaired Presentation: Straw Oral Phase Impairments: Reduced labial seal Pharyngeal  Phase Impairments: Suspected delayed Swallow;Cough - Delayed    Nectar Thick Nectar  Thick Liquid: Within functional limits Presentation: Straw   Honey Thick Honey Thick Liquid: Not tested   Puree Puree: Within functional limits Presentation: Spoon   Solid     Solid: Not tested     Harlon Ditty, MA CCC-SLP  Acute Rehabilitation Services Pager [phone] Office [phone]  Claudine Mouton 05/30/2020,12:03 PM

## 2020-05-30 NOTE — Progress Notes (Addendum)
STROKE TEAM PROGRESS NOTE   INTERVAL HISTORY Daughter is at bedside. Pt lying in bed, no acute distress. No acute event over night. Still has right hemiparesis. Will do MBS tomorrow, but able to take applesauce for meds. Daughter stated that pt is not on ASA or statin at home. She had stroke 3 years ago, but able to walk with walker at home.   Vitals:   05/29/20 2330 05/30/20 0107 05/30/20 0307 05/30/20 0707  BP: (!) 164/83 (!) 181/147 (!) 149/70 (!) 136/101  Pulse: 90 100 79 83  Resp: 19 (!) Temp:  98.5 F (36.9 C) 98.6 F (37 C) 98.6 F (37 C)  TempSrc:  Oral Oral Oral  SpO2: 95% 93% 93% 92%  Weight:  68.7 kg    Height:        CBC:  Recent Labs  Lab 05/29/20 2143 05/29/20 2150  WBC  --  7.0  NEUTROABS  --  3.0  HGB 13.3 12.6  HCT 39.0 40.8  MCV  --  83.8  PLT  --  212    Basic Metabolic Panel:  Recent Labs  Lab 05/29/20 2143 05/29/20 2150  NA 140 137  K 3.5 3.5  CL 101 103  CO2  --  24  GLUCOSE 95 100*  BUN 11 5*  CREATININE 0.90 1.03*  CALCIUM  --  8.5*   Lipid Panel:     Component Value Date/Time   CHOL 204 (H) 05/30/2020 0425   TRIG 139 05/30/2020 0425   HDL 40 (L) 05/30/2020 0425   CHOLHDL 5.1 05/30/2020 0425   VLDL 28 05/30/2020 0425   LDLCALC 136 (H) 05/30/2020 0425   HgbA1c:  Lab Results  Component Value Date   HGBA1C 5.6 03/05/2017   Urine Drug Screen: No results found for: LABOPIA, COCAINSCRNUR, LABBENZ, AMPHETMU, THCU, LABBARB  Alcohol Level No results found for: ETH  IMAGING past 24 hours MR ANGIO HEAD WO CONTRAST  Result Date: 05/30/2020 CLINICAL DATA:  Initial evaluation for acute stroke, slurred speech, right-sided weakness. EXAM: MRI HEAD WITHOUT CONTRAST MRA HEAD WITHOUT CONTRAST TECHNIQUE: Multiplanar, multiecho pulse sequences of the brain and surrounding structures were obtained without intravenous contrast. Angiographic images of the head were obtained using MRA technique without contrast. COMPARISON:  Prior CT from  earlier same day as well as previous MRI from 03/04/2017. FINDINGS: MRI HEAD FINDINGS Brain: Examination significantly degraded by motion artifact. Diffuse prominence of the CSF containing spaces compatible generalized age-related cerebral atrophy. Patchy T2/FLAIR hyperintensity within the periventricular and deep white matter both cerebral hemispheres most consistent with chronic small vessel ischemic disease, moderate in nature. Multiple remote lacunar infarcts present about the bilateral basal ganglia and right thalamus. Subtle approximate 6 mm focus of diffusion abnormality seen involving the left paramedian pons, suspicious for an acute ischemic infarct (series 2, image 15). No visible associated hemorrhage or mass effect on this motion degraded exam. No other diffusion abnormality to suggest acute or subacute ischemia. Gray-white matter differentiation otherwise maintained. No other areas of remote cortical infarction. No foci of susceptibility artifact to suggest acute or chronic intracranial hemorrhage on this motion degraded exam. 2.8 cm mass involving the right frontal calvarium with extraosseous extension again seen, indeterminate. No associated mass effect on the subjacent right frontal lobe. No other mass lesion. No mass effect or midline shift. No hydrocephalus or extra-axial fluid collection. Pituitary gland grossly within normal limits. Midline structures intact. Vascular: Major intracranial vascular flow voids are maintained. Skull and upper cervical  spine: Craniocervical junction within normal limits. Upper cervical spine normal. Bone marrow signal intensity within normal limits. No other focal marrow replacing lesions. Scalp soft tissues otherwise unremarkable. Sinuses/Orbits: Globes and orbital soft tissues within normal limits. Visualized paranasal sinuses are grossly clear. No significant mastoid effusion. Other: None. MRA HEAD FINDINGS ANTERIOR CIRCULATION: Examination moderately degraded by  motion artifact. Visualized distal cervical segments of the internal carotid arteries are patent with antegrade flow. Petrous segments widely patent. Scattered atheromatous irregularity throughout the cavernous/supraclinoid ICAs without hemodynamically significant stenosis. Right A1 segment widely patent. Left A1 hypoplastic and/or absent, accounting for the diminutive left ICA is compared to the right. Normal anterior communicating artery complex. Anterior cerebral arteries patent to their distal aspects without appreciable stenosis. M1 segments patent bilaterally. Grossly normal MCA bifurcations. Distal MCA branches well perfused and fairly symmetric. POSTERIOR CIRCULATION: Vertebral arteries patent to the vertebrobasilar junction without stenosis. Left vertebral artery slightly dominant. Neither PICA origin visualized. Basilar widely patent proximally. There is a short-segment moderate stenosis involving the mid basilar artery (series 2, image 91). Basilar otherwise patent to its distal aspect. Superior cerebral arteries grossly patent proximally. PCAs primarily supplied via the basilar, both of which are grossly patent to their distal aspects without definite stenosis, although evaluation limited by motion. No intracranial aneurysm. IMPRESSION: MRI HEAD IMPRESSION: 1. Motion degraded exam. 2. Subtle approximate 6 mm acute ischemic nonhemorrhagic left pontine infarct. 3. 2.8 cm mass involving the right frontal calvarium with extraosseous extension, indeterminate. In retrospect, this lesion appears to have been present on prior MRI from 2018, although is Sherrye larger on today's exam. Again, primary differential considerations include a possible intra osseous meningioma versus osseous metastasis. Neuro surgical consultation and referral for consideration of possible histologic sampling suggested for further evaluation. Additionally, postcontrast imaging could be performed for complete evaluation as clinically  desired. 4. Underlying age-related cerebral atrophy with moderate chronic microvascular ischemic disease, with multiple remote lacunar infarcts about the bilateral basal ganglia and right thalamus. MRA HEAD IMPRESSION: 1. Motion degraded exam. 2. Negative intracranial MRA for large vessel occlusion. 3. 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 4. Additional scattered intracranial atherosclerotic irregularity, with no other hemodynamically significant or correctable stenosis identified. Electronically Signed   By: Rise Mu M.D.   On: 05/30/2020 00:34   MR BRAIN WO CONTRAST  Result Date: 05/30/2020 CLINICAL DATA:  Initial evaluation for acute stroke, slurred speech, right-sided weakness. EXAM: MRI HEAD WITHOUT CONTRAST MRA HEAD WITHOUT CONTRAST TECHNIQUE: Multiplanar, multiecho pulse sequences of the brain and surrounding structures were obtained without intravenous contrast. Angiographic images of the head were obtained using MRA technique without contrast. COMPARISON:  Prior CT from earlier same day as well as previous MRI from 03/04/2017. FINDINGS: MRI HEAD FINDINGS Brain: Examination significantly degraded by motion artifact. Diffuse prominence of the CSF containing spaces compatible generalized age-related cerebral atrophy. Patchy T2/FLAIR hyperintensity within the periventricular and deep white matter both cerebral hemispheres most consistent with chronic small vessel ischemic disease, moderate in nature. Multiple remote lacunar infarcts present about the bilateral basal ganglia and right thalamus. Subtle approximate 6 mm focus of diffusion abnormality seen involving the left paramedian pons, suspicious for an acute ischemic infarct (series 2, image 15). No visible associated hemorrhage or mass effect on this motion degraded exam. No other diffusion abnormality to suggest acute or subacute ischemia. Gray-white matter differentiation otherwise maintained. No other areas of  remote cortical infarction. No foci of susceptibility artifact to suggest acute or chronic intracranial hemorrhage on this  motion degraded exam. 2.8 cm mass involving the right frontal calvarium with extraosseous extension again seen, indeterminate. No associated mass effect on the subjacent right frontal lobe. No other mass lesion. No mass effect or midline shift. No hydrocephalus or extra-axial fluid collection. Pituitary gland grossly within normal limits. Midline structures intact. Vascular: Major intracranial vascular flow voids are maintained. Skull and upper cervical spine: Craniocervical junction within normal limits. Upper cervical spine normal. Bone marrow signal intensity within normal limits. No other focal marrow replacing lesions. Scalp soft tissues otherwise unremarkable. Sinuses/Orbits: Globes and orbital soft tissues within normal limits. Visualized paranasal sinuses are grossly clear. No significant mastoid effusion. Other: None. MRA HEAD FINDINGS ANTERIOR CIRCULATION: Examination moderately degraded by motion artifact. Visualized distal cervical segments of the internal carotid arteries are patent with antegrade flow. Petrous segments widely patent. Scattered atheromatous irregularity throughout the cavernous/supraclinoid ICAs without hemodynamically significant stenosis. Right A1 segment widely patent. Left A1 hypoplastic and/or absent, accounting for the diminutive left ICA is compared to the right. Normal anterior communicating artery complex. Anterior cerebral arteries patent to their distal aspects without appreciable stenosis. M1 segments patent bilaterally. Grossly normal MCA bifurcations. Distal MCA branches well perfused and fairly symmetric. POSTERIOR CIRCULATION: Vertebral arteries patent to the vertebrobasilar junction without stenosis. Left vertebral artery slightly dominant. Neither PICA origin visualized. Basilar widely patent proximally. There is a short-segment moderate stenosis  involving the mid basilar artery (series 2, image 91). Basilar otherwise patent to its distal aspect. Superior cerebral arteries grossly patent proximally. PCAs primarily supplied via the basilar, both of which are grossly patent to their distal aspects without definite stenosis, although evaluation limited by motion. No intracranial aneurysm. IMPRESSION: MRI HEAD IMPRESSION: 1. Motion degraded exam. 2. Subtle approximate 6 mm acute ischemic nonhemorrhagic left pontine infarct. 3. 2.8 cm mass involving the right frontal calvarium with extraosseous extension, indeterminate. In retrospect, this lesion appears to have been present on prior MRI from 2018, although is Docie larger on today's exam. Again, primary differential considerations include a possible intra osseous meningioma versus osseous metastasis. Neuro surgical consultation and referral for consideration of possible histologic sampling suggested for further evaluation. Additionally, postcontrast imaging could be performed for complete evaluation as clinically desired. 4. Underlying age-related cerebral atrophy with moderate chronic microvascular ischemic disease, with multiple remote lacunar infarcts about the bilateral basal ganglia and right thalamus. MRA HEAD IMPRESSION: 1. Motion degraded exam. 2. Negative intracranial MRA for large vessel occlusion. 3. 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 4. Additional scattered intracranial atherosclerotic irregularity, with no other hemodynamically significant or correctable stenosis identified. Electronically Signed   By: Rise Mu M.D.   On: 05/30/2020 00:34   CT HEAD CODE STROKE WO CONTRAST  Result Date: 05/29/2020 CLINICAL DATA:  Code stroke. Initial evaluation for acute slurred speech, leg weakness. EXAM: CT HEAD WITHOUT CONTRAST TECHNIQUE: Contiguous axial images were obtained from the base of the skull through the vertex without intravenous contrast. COMPARISON:  Prior  study from 03/04/2017. FINDINGS: Brain: Generalized age-related cerebral atrophy with moderate chronic microvascular ischemic disease. Scattered remote lacunar infarcts present about the bilateral basal ganglia and right internal capsule/thalamus. No acute intracranial hemorrhage. No acute large vessel territory infarct. No parenchymal mass lesion, mass effect, or midline shift. No hydrocephalus or extra-axial fluid collection. There is a hyperdense ovoid lesion measuring approximately 2.5 cm positioned within the right frontal calvarium (series 3, image 18). Upon review of prior CT from 2018, this lesion was likely present, although faintly visible at that time.  Associated lytic changes within the underlying calvarium with probable associated pathologic fracture (series 4, image 46). Soft tissue component extends into the overlying right frontal scalp as well. Vascular: No hyperdense vessel. Calcified atherosclerosis present at the skull base. Skull: 2.5 cm lytic lesion involving the right frontal calvarium as above. Calvarium otherwise intact. No other scalp soft tissue abnormality. Sinuses/Orbits: Globes and orbital soft tissues within normal limits. Scattered mucosal thickening noted within the visualized ethmoidal air cells. Visualized paranasal sinuses are otherwise clear. No mastoid effusion. Other: None. ASPECTS [facility]([place] Stroke Program Early CT Score) - Ganglionic level infarction (caudate, lentiform nuclei, internal capsule, insula, M1-M3 cortex): 7 - Supraganglionic infarction (M4-M6 cortex): 3 Total score (0-10 with 10 being normal): 10 IMPRESSION: 1. No acute intracranial infarct or other abnormality. 2. ASPECTS is 10. 3. Approximate 2.5 cm lytic lesion involving the right frontal calvarium with probable associated pathologic fracture. Finding is nonspecific, although is suspected to have been present on previous CT from 2018, although only faintly visible at that time. Primary differential consideration  consists of an intra osseous meningioma, although possible osseous metastasis not excluded. Further evaluation with dedicated brain MRI, with and without contrast suggested for further evaluation. Additionally, neuro surgical consultation and referral for possible histologic sampling suggested. 4. Underlying age-related cerebral atrophy with moderate chronic microvascular ischemic disease, with multiple remote lacunar infarcts about the bilateral basal ganglia as above. Critical Value/emergent results were called by telephone at the time of interpretation on 05/29/2020 at 10:32 pm to provider [facility] , who verbally acknowledged these results. Electronically Signed   By: Rise MuBenjamin  McClintock M.D.   On: 05/29/2020 22:36    PHYSICAL EXAM  Temp:  [98.2 F (36.8 C)-98.7 F (37.1 C)] 98.7 F (37.1 C) (06/30 1100) Pulse Rate:  [61-100] 61 (06/30 1100) Resp:  [17-25] 18 (06/30 1100) BP: (132-190)/(70-147) 181/85 (06/30 1100) SpO2:  [92 %-98 %] 98 % (06/30 1100) Weight:  [64 kg-68.7 kg] 68.7 kg (06/30 0107)  General - Well nourished, well developed, in no apparent distress.  Ophthalmologic - fundi not visualized due to noncooperation.  Cardiovascular - Regular rhythm and rate.  Neuro - some limitation due to language barrier. Awake alert, orientated to place and people, DOB, but not to time or age. Moderate dysarthria but no aphasia, not cooperative on repetition but able to name 4/4. No gaze palsy, attending to both sides. Visual field full. PERRL. Right nasolabial fold mild flattening. RUE 4/5 and distal 4-/5. RLE proximal 2/5, knee extension 3/5, distal PF/DF 3/5. LUE 5/5, LLE proximal and knee extension 3/5, distal PF/DF 4/5. No babinski. Sensation symmetrical, FTN intact grossly, gait not tested.    ASSESSMENT/PLAN [name] is a 86 y.o. female with history of HTN, HLD, Stroke in 2018 w/ residual LLE weakness presenting with R arm weakness, difficulty walking and slurred speech. BP  elevated 180-190s. Out of window for tPA. No LVO.  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 Small vessel disease. Atrophy. Multiple old B basal ganglia lacunes. ASPECTS 10.     MRI  L pontine infarct. Small vessel disease. Atrophy. Old B basal ganglia and R thalamic lacunes.   MRA  Motion. No LVO. Mid BA moderate stenosis. Scattered intracranial atherosclerosis.   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 BA 30% stenosis  2D Echo EF > 75%  LDL 136  HgbA1c pending   Lovenox 30 mg sq daily  for VTE prophylaxis  No antithrombotics prior to admission, now on aspirin 81 mg daily and clopidogrel 75 mg daily. Continue DAPT for 3 weeks and then ASA alone.  Therapy recommendations:  Pending, but daughter would like to take pt home  Disposition:  pending   Hx stroke/TIA  03/2017 - LLE weakness. MRI R PLIC infarct secondary to small vessel disease source. MRA and CUS neg. EF 60-65%. LDL 110 and A1C 5.6. Asa 325 and zocor 10. HH therapies.  Followed with Dr. Lucia Gaskins at [facility]  R calvarial lytic lesion  CT head - R frontal calvarium lytic lesion, present 2018 but less so.   MRI - 2.8cm R frontal calvaium mass w/ extraosseous extension seen in 2018 but less so.   CTA head and neck - Right frontal calvarial lytic mass as described by MRI. Differential diagnosis is primarily metastatic disease versus myeloma versus intra osseous meningioma versus enlarging hemangioma.  Recommend outpt neurosurgery referral  Hypertensive Urgency  SBP 180-190s on arrival  Improving . Gradually normalize in 2-3 days . Long-term BP goal normotensive  Hyperlipidemia  Home meds:  none  Now on liptitor 40  LDL 136, goal < 70  Continue statin at discharge  Dysphagia . Secondary to stroke . dys 1 and nectar thick . MBS tomorrow  . Speech on board   Other Stroke Risk Factors  Advanced age  Obesity,  Body mass index is 30.59 kg/m., recommend weight loss, diet and exercise as appropriate   Other Active Problems  Medication noncompliance  Loss of follow up  Hospital day # 1  Neurology will sign off. Please call with questions. Pt will follow up with Dr. Lucia Gaskins at [facility] in about 4 weeks. Thanks for the consult.  Marvel Plan, MD PhD Stroke Neurology 05/30/2020 1:12 PM   To contact Stroke Continuity provider, please refer to [email or web address]. After hours, contact General Neurology

## 2020-05-30 NOTE — Evaluation (Addendum)
Occupational Therapy Evaluation Patient Details Name: Ann Coleman MRN: [id number] DOB: Feb 20, 1924 Today's Date: 05/30/2020    History of Present Illness Pt is a 86 y.o. [place]ian-speaking female with PMH of CVA, HTN, mixed HLD presented to ED with weakness in legs, slurred speech, and R arm drift. MRI reveals subtle approximate 6 mm acute ischemic nonhemorrhagic left pontine infarct and 2.8 cm mass involving the right frontal calvarium with extraosseous extension that has increased in size since 2018.   Clinical Impression   Evaluation limited due to barriers in communication, pt [name] speaking and utilized interpreter. PTA pt reports not using DME to ambulate and living with her daughter - no family present to confirm. Requires mod A-Total A with ADLs due to weakness and unsteadiness. Requires mod A +2 for bed mobility for deficits in balance and language barriers. When sitting EOB pt required Intermittent Supervision - Mod A due to deficits in balance. Cognition difficult to assess due to language barriers - inconsistent with following commands and answering questions. Will need further assessment to determine PLOF and deficits. Believe pt would benefit from skilled OT services acutely and at the CIR level to return to PLOF and increase participation in ADLs. If we are not able to progress mobility next session, ot will require higher level of care.     Follow Up Recommendations  CIR;Other (comment) (if we are not able to progress mobility next session, pt will require a higher level of care)    Equipment Recommendations  Other (comment) (TBD at next level of care)    Recommendations for Other Services Rehab consult     Precautions / Restrictions Precautions Precautions: Fall Restrictions Weight Bearing Restrictions: No      Mobility Bed Mobility Overal bed mobility: Needs Assistance Bed Mobility: Rolling;Supine to Sit;Sit to Supine Rolling: Mod assist;+2 for physical assistance;+2  for safety/equipment   Supine to sit: Mod assist;+2 for physical assistance;+2 for safety/equipment Sit to supine: Mod assist;+2 for physical assistance;+2 for safety/equipment   General bed mobility comments: Mod A+2 for bed mobility due to language barrier and for pt safety  Transfers                 General transfer comment: deferred due to safety    Balance Overall balance assessment: Needs assistance Sitting-balance support: Bilateral upper extremity supported;Feet supported Sitting balance-Leahy Scale: Poor Sitting balance - Comments: Mod A-intermitent supervision with sitting balance                                    ADL either performed or assessed with clinical judgement   ADL Overall ADL's : Needs assistance/impaired Eating/Feeding: NPO   Grooming: Set up;Sitting;Min guard Grooming Details (indicate cue type and reason): pt able to bring hand to face in sitting with min guard for balance Upper Body Bathing: Moderate assistance;Sitting;Minimal assistance Upper Body Bathing Details (indicate cue type and reason): Min-Mod A with sitting balance Lower Body Bathing: Total assistance;+2 for physical assistance;+2 for safety/equipment;Sitting/lateral leans Lower Body Bathing Details (indicate cue type and reason): Verbal prompts to perform LB ADLs. Pt initiated but needed Total A due to generalized weakness and decreased sitting balance. Upper Body Dressing : Minimal assistance;Moderate assistance;Sitting Upper Body Dressing Details (indicate cue type and reason): Min-Mod A for sitting balance Lower Body Dressing: Total assistance;+2 for physical assistance;+2 for safety/equipment;Sitting/lateral leans Lower Body Dressing Details (indicate cue type and reason): Verbal prompts to perform LB  ADLs. Pt initiated but needed Total A due to generalized weakness and decreased sitting balance.   Toilet Transfer Details (indicate cue type and reason): deferred due to  safety need to futher assess   Toileting - Clothing Manipulation Details (indicate cue type and reason): deferred due to safety need to futher assess   Tub/Shower Transfer Details (indicate cue type and reason): deferred due to safety need to futher assess Functional mobility during ADLs: +2 for physical assistance;+2 for safety/equipment;Moderate assistance General ADL Comments: Mod A +2 for bed mobility. Limited eval due to language barrier and pt participation                  Pertinent Vitals/Pain Pain Assessment: Faces Faces Pain Scale: Hurts a little bit Pain Descriptors / Indicators: Grimacing Pain Intervention(s): Monitored during session     Hand Dominance Right   Extremity/Trunk Assessment Upper Extremity Assessment Upper Extremity Assessment: Generalized weakness;RUE deficits/detail RUE Deficits / Details: decreased grip with initial strength 3/5 then progressed to 5/5 with time. Difficult to assess due to language barrier   Lower Extremity Assessment Lower Extremity Assessment: Defer to PT evaluation   Cervical / Trunk Assessment Cervical / Trunk Assessment: Kyphotic   Communication Communication Communication: Prefers language other than English;Interpreter utilized   Cognition Arousal/Alertness: Youth worker During Therapy: Flat affect Overall Cognitive Status: No family/caregiver present to determine baseline cognitive functioning                                 General Comments: Difficult to assess due to language barrier. Pt inconsistent with responding to questions and following commands   General Comments  Utilized interpreter with limited eval due to communication barrier. Need further assessment to determine PLOF and deficits.            Home Living Family/patient expects to be discharged to:: Private residence Living Arrangements: Children   Type of Home: House                           Additional Comments:  limited in questions due to communication barriers  Lives With: Daughter    Prior Functioning/Environment          Comments: Limited information due to communication barrier. Pt reports she did not need DME to ambulate and lived with daughter        OT Problem List: Decreased strength;Decreased activity tolerance;Impaired balance (sitting and/or standing);Decreased safety awareness;Pain;Impaired UE functional use      OT Treatment/Interventions: Self-care/ADL training;Therapeutic exercise;Neuromuscular education;Energy conservation;DME and/or AE instruction;Therapeutic activities;Patient/family education;Balance training    OT Goals(Current goals can be found in the care plan section) Acute Rehab OT Goals Patient Stated Goal: none Time For Goal Achievement: 06/13/20 Potential to Achieve Goals: Good  OT Frequency: Min 2X/week           Co-evaluation PT/OT/SLP Co-Evaluation/Treatment: Yes Reason for Co-Treatment: For patient/therapist safety;To address functional/ADL transfers;Necessary to address cognition/behavior during functional activity;Complexity of the patient's impairments (multi-system involvement)          AM-PAC OT "6 Clicks" Daily Activity     Outcome Measure Help from another person eating meals?: A Little Help from another person taking care of personal grooming?: A Lot Help from another person toileting, which includes using toliet, bedpan, or urinal?: A Lot Help from another person bathing (including washing, rinsing, drying)?: A Lot Help from another person to put on and  taking off regular upper body clothing?: A Lot Help from another person to put on and taking off regular lower body clothing?: Total 6 Click Score: 12   End of Session    Activity Tolerance: Patient limited by fatigue Patient left: in bed;with call bell/phone within reach;with bed alarm set  OT Visit Diagnosis: Other symptoms and signs involving cognitive function;Pain Pain - part of  body:  (unable to be identified by pt)                Time: 5361-4431 OT Time Calculation (min): 33 min Charges:  OT General Charges $OT Visit: 1 Visit OT Evaluation $OT Eval Moderate Complexity: 1 Mod  Merrill Deanda/OTS  Destin Vinsant 05/30/2020, 2:42 PM

## 2020-05-30 NOTE — Evaluation (Signed)
Physical Therapy Evaluation Patient Details Name: Ann Coleman MRN: [id number] DOB: 1924-10-10 Today's Date: 05/30/2020   History of Present Illness  Pt is a 86 y.o. [place]-speaking female with PMH of CVA, HTN, mixed HLD presented to ED with weakness in legs, slurred speech, and R arm drift. MRI reveals subtle approximate 6 mm acute ischemic nonhemorrhagic left pontine infarct and 2.8 cm mass involving the right frontal calvarium with extraosseous extension that has increased in size since 2018.  Clinical Impression  Pt admitted with above diagnosis. At the time of PT eval pt was able to perform transfers to/from EOB with up to +2 mod assist. Interpreter utilized however no other family present. Per nursing staff, the pt's daughter has been present and assisting with communication. Would like to see if family can help to motivate the pt to participate with therapy next session. Based on age, deficits seen today and reported independent PLOF, recommend follow-up at the CIR level, however if we cannot functionally progress this patient next session, may want to consider a higher level of care at d/c. Pt currently with functional limitations due to the deficits listed below (see PT Problem List). Pt will benefit from skilled PT to increase their independence and safety with mobility to allow discharge to the venue listed below.       Follow Up Recommendations CIR;Supervision/Assistance - 24 hour    Equipment Recommendations  None recommended by PT (Defer to next venue of care)    Recommendations for Other Services Rehab consult     Precautions / Restrictions Precautions Precautions: Fall Restrictions Weight Bearing Restrictions: No      Mobility  Bed Mobility Overal bed mobility: Needs Assistance Bed Mobility: Rolling;Supine to Sit;Sit to Supine Rolling: Mod assist;+2 for physical assistance;+2 for safety/equipment   Supine to sit: Mod assist;+2 for physical assistance;+2 for  safety/equipment Sit to supine: Mod assist;+2 for physical assistance;+2 for safety/equipment   General bed mobility comments: Mod A+2 for bed mobility due to language barrier and for pt safety  Transfers                 General transfer comment: deferred due to safety  Ambulation/Gait                Stairs            Wheelchair Mobility    Modified Rankin (Stroke Patients Only)       Balance Overall balance assessment: Needs assistance Sitting-balance support: Bilateral upper extremity supported;Feet supported Sitting balance-Leahy Scale: Poor Sitting balance - Comments: Mod A-intermitent supervision with sitting balance                                      Pertinent Vitals/Pain Pain Assessment: Faces Faces Pain Scale: Hurts a little bit Pain Descriptors / Indicators: Grimacing Pain Intervention(s): Limited activity within patient's tolerance;Monitored during session;Repositioned    Home Living Family/patient expects to be discharged to:: Private residence Living Arrangements: Children   Type of Home: House Home Access: Stairs to enter Entrance Stairs-Rails: None Entrance Stairs-Number of Steps: 2 Home Layout: One level Home Equipment: Walker - 4 wheels;Hand held shower head Additional Comments: limited in questions due to communication barriers. Some above information was taken from prior admission but pt unable to confirm if it is still correct.     Prior Function           Comments: Limited information due  to communication barrier. Pt reports she did not need DME to ambulate and lived with daughter     Hand Dominance   Dominant Hand: Right    Extremity/Trunk Assessment   Upper Extremity Assessment Upper Extremity Assessment: Defer to OT evaluation RUE Deficits / Details: decreased grip with initial strength 3/5 then progressed to 5/5 with time.    Lower Extremity Assessment Lower Extremity Assessment: Generalized  weakness (Limited eval due to language barrier)    Cervical / Trunk Assessment Cervical / Trunk Assessment: Kyphotic  Communication   Communication: Prefers language other than [place];Interpreter utilized ([name])  Cognition Arousal/Alertness: Awake/alert Behavior During Therapy: Flat affect Overall Cognitive Status: No family/caregiver present to determine baseline cognitive functioning                                 General Comments: Pt inconsistent with responding to questions and following commands      General Comments General comments (skin integrity, edema, etc.): Utilized interpreter with limited eval due to communication barrier. Need further assessment to determine PLOF and deficits.    Exercises     Assessment/Plan    PT Assessment Patient needs continued PT services  PT Problem List Decreased strength;Decreased activity tolerance;Decreased balance;Decreased mobility;Decreased range of motion;Decreased cognition;Decreased knowledge of use of DME;Decreased safety awareness;Decreased knowledge of precautions;Pain       PT Treatment Interventions DME instruction;Gait training;Functional mobility training;Therapeutic activities;Therapeutic exercise;Neuromuscular re-education;Patient/family education    PT Goals (Current goals can be found in the Care Plan section)  Acute Rehab PT Goals Patient Stated Goal: none PT Goal Formulation: Patient unable to participate in goal setting Time For Goal Achievement: 06/13/20 Potential to Achieve Goals: Good    Frequency Min 4X/week   Barriers to discharge        Co-evaluation PT/OT/SLP Co-Evaluation/Treatment: Yes Reason for Co-Treatment: Complexity of the patient's impairments (multi-system involvement);For patient/therapist safety;To address functional/ADL transfers;Necessary to address cognition/behavior during functional activity           AM-PAC PT "6 Clicks" Mobility  Outcome Measure Help needed  turning from your back to your side while in a flat bed without using bedrails?: A Little Help needed moving from lying on your back to sitting on the side of a flat bed without using bedrails?: A Little Help needed moving to and from a bed to a chair (including a wheelchair)?: A Lot Help needed standing up from a chair using your arms (e.g., wheelchair or bedside chair)?: A Lot Help needed to walk in hospital room?: A Lot Help needed climbing 3-5 steps with a railing? : A Lot 6 Click Score: 14    End of Session Equipment Utilized During Treatment: Gait belt Activity Tolerance: Patient tolerated treatment well Patient left: in bed;with call bell/phone within reach;with bed alarm set Nurse Communication: Mobility status PT Visit Diagnosis: Other symptoms and signs involving the nervous system (R29.898)    Time: 7371-0626 PT Time Calculation (min) (ACUTE ONLY): 31 min   Charges:   PT Evaluation $PT Eval Moderate Complexity: 1 Mod          Conni Slipper, PT, DPT Acute Rehabilitation Services Pager: [phone] Office: [phone]   Marylynn Pearson 05/30/2020, 3:14 PM

## 2020-05-30 NOTE — Progress Notes (Signed)
  Echocardiogram 2D Echocardiogram has been attempted. Patient not in room. Will reattempt at later time.  Ann Coleman 05/30/2020, 8:54 AM

## 2020-05-30 NOTE — Plan of Care (Signed)
  Problem: Nutrition: Goal: Dietary intake will improve Outcome: Progressing   Problem: Safety: Goal: Ability to remain free from injury will improve Outcome: Progressing

## 2020-05-30 NOTE — Progress Notes (Signed)
  Echocardiogram 2D Echocardiogram has been performed.  Railee Bonillas G Raine Elsass 05/30/2020, 10:22 AM

## 2020-05-31 ENCOUNTER — Inpatient Hospital Stay (HOSPITAL_COMMUNITY): Payer: Medicare Other

## 2020-05-31 DIAGNOSIS — E781 Pure hyperglyceridemia: Secondary | ICD-10-CM

## 2020-05-31 LAB — HEMOGLOBIN A1C
Hgb A1c MFr Bld: 6.1 % — ABNORMAL HIGH (ref 4.8–5.6)
Mean Plasma Glucose: 128 mg/dL

## 2020-05-31 NOTE — PMR Pre-admission (Signed)
PMR Admission Coordinator Pre-Admission Assessment  Patient: Ann Coleman is an 86 y.o., female MRN: [id number] DOB: 1924/10/07 Height: '4\' 11"'  (149.9 cm) Weight: 68.7 kg              Insurance Information HMO:     PPO:      PCP:     IPA:      80/20: yes     OTHER:  PRIMARY: Medicare A and B      Policy#: [id number]      Subscriber: patient CM Name:       Phone#:      Fax#:  Pre-Cert#:       Employer:  Benefits:  Phone #: verified eligibility online      Name: [employer]. Date: Part A effective 12/02/1991; Part B effective 12/01/1990     Deduct: $1,484      Out of Pocket Max: NA      Life Max: NA  CIR: Covered per Medicare guidelines once yearly deductible has been met       SNF: days 1-20, 100%; days 21-100, 80%.  Outpatient: 80%     Co-Pay: 20% Home Health: 100%      Co-Pay:  DME: 80%     Co-Pay: 20% Providers: Pt's choice SECONDARY: Medicaid [place]       Policy#: [id number] S      Phone#: verified online via [employer] on 06/01/20 Coverage code: [facility]   Financial Counselor:       Phone#:   The "Data Collection Information Summary" for patients in Inpatient Rehabilitation Facilities with attached "Privacy Act [place] Records" was provided and verbally reviewed with: Family  Emergency Contact Information Contact Information    Name Relation Home Work Mobile   Chhann,[name] Daughter [phone]       Current Medical History  Patient Admitting Diagnosis: Acute left pontine stroke  History of Present Illness: Ann Coleman is a 86 year old right-handed non-English-speaking [place] female with history of hypertension and hyperlipidemia as well as CVA 3 years ago.  Per chart review lives with her children.  1 level home 2 steps to entry.  Reportedly independent prior to admission using a walker and assistance from her daughter.  Presented 05/29/2020 with left-sided weakness and slurred speech.  Noted systolic blood pressure in the 190s.  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  In retrospect of this lesion appeared to have been present on her prior MRI from 2018.  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  This did appear chronic and again noted on MRI 2018.  Echocardiogram with ejection fraction of 75% no wall motion abnormalities. MRI of the brain was again completed 05/31/2020 for follow-up showing mild increase in size of small acute infarct of the left parasagittal pons. Admission chemistries BUN 5 creatinine 1.03, SARS Covid is negative.  She is on a dysphagia #2 nectar thick liquid diet.  Currently maintained on aspirin and Plavix for CVA prophylaxis x3 weeks and aspirin alone.  Subcutaneous Lovenox for DVT prophylaxis.  Therapy evaluations completed with recommendations for CIR.  Patient is to be admitted for a comprehensive rehab program on 06/02/20.  Complete NIHSS TOTAL: 9 Glasgow Coma Scale Score: 15  Past Medical History  Past Medical History:  Diagnosis Date  . High cholesterol   .  Hypertension   . Stroke [facility])     Family History  family history is not on file.  Prior Rehab/Hospitalizations:  Has the patient had prior rehab or hospitalizations prior to admission? No  Has the patient had major surgery during 100 days prior to admission? No  Current Medications   Current Facility-Administered Medications:  .  acetaminophen (TYLENOL) tablet 650 mg, 650 mg, Oral, Q4H PRN, 650 mg at 05/30/20 0322 **OR** acetaminophen (TYLENOL) 160 MG/5ML solution 650 mg, 650 mg, Per Tube, Q4H PRN **OR** acetaminophen (TYLENOL) suppository 650 mg, 650 mg, Rectal, Q4H PRN, [name], MD .  aspirin EC tablet 81 mg, 81 mg, Oral, Daily, [name], MD, 81 mg at 06/01/20 1100 .  atorvastatin (LIPITOR) tablet 40 mg, 40 mg, Oral,  Daily, [name], MD, 40 mg at 06/01/20 1100 .  clopidogrel (PLAVIX) tablet 75 mg, 75 mg, Oral, Daily, [name], MD, 75 mg at 06/01/20 1100 .  enoxaparin (LOVENOX) injection 30 mg, 30 mg, Subcutaneous, Q24H, [name], MD, 30 mg at 06/01/20 1100 .  Resource ThickenUp Clear, , Oral, PRN, Donne Hazel, MD  Patients Current Diet:  Diet Order            DIET DYS 2 Room service appropriate? Yes; Fluid consistency: Nectar Thick  Diet effective now                 Precautions / Restrictions Precautions Precautions: Fall Restrictions Weight Bearing Restrictions: No   Has the patient had 2 or more falls or a fall with injury in the past year?No  Prior Activity Level Community (5-7x/wk): was very active according to her daughter; only used [facility] for ambulation; she was not working or driving and did require some assistance for stair negotiation  Prior Functional Level Prior Function Comments: Limited information due to communication barrier. Pt reports she did not need DME to ambulate and lived with daughter  Self Care: Did the patient need help bathing, dressing, using the toilet or eating?  Independent  Indoor Mobility: Did the patient need assistance with walking from room to room (with or without device)? Independent  Stairs: Did the patient need assistance with internal or external stairs (with or without device)? Needed some help  Functional Cognition: Did the patient need help planning regular tasks such as shopping or remembering to take medications? Independent  Home Assistive Devices / Equipment Home Assistive Devices/Equipment: Cane (specify quad or straight) Home Equipment: Walker - 4 wheels, Hand held shower head  Prior Device Use: Indicate devices/aids used by the patient prior to current illness, exacerbation or injury? single point cane  Current Functional Level Cognition  Arousal/Alertness: Awake/alert Overall Cognitive Status: Difficult to  assess Difficult to assess due to: Non-English speaking Orientation Level: Oriented to person, Oriented to place General Comments: following simple commands with interpreter Attention: Focused, Sustained Focused Attention: Appears intact Sustained Attention: Impaired Sustained Attention Impairment: Verbal basic, Functional basic Awareness: Impaired Awareness Impairment: Emergent impairment Problem Solving: Impaired Problem Solving Impairment: Verbal basic, Functional basic    Extremity Assessment (includes Sensation/Coordination)  Upper Extremity Assessment: RUE deficits/detail RUE Deficits / Details: 0/5 grip, 2/5 elbow and shoulder  Lower Extremity Assessment: RLE deficits/detail RLE Deficits / Details: 0/5 ankle, 2/5 knee and hip    ADLs  Overall ADL's : Needs assistance/impaired Eating/Feeding: NPO Grooming: Set up, Sitting, Min guard Grooming Details (indicate cue type and reason): pt able to bring hand to face in sitting with min guard for balance Upper Body Bathing: Moderate  assistance, Sitting, Minimal assistance Upper Body Bathing Details (indicate cue type and reason): Min-Mod A with sitting balance Lower Body Bathing: Total assistance, +2 for physical assistance, +2 for safety/equipment, Sitting/lateral leans Lower Body Bathing Details (indicate cue type and reason): Pt refusing and saying she could not do LB ADLs Upper Body Dressing : Minimal assistance, Moderate assistance, Sitting Upper Body Dressing Details (indicate cue type and reason): Min-Mod A for sitting balance Lower Body Dressing: Total assistance, +2 for physical assistance, +2 for safety/equipment, Sit to/from stand Lower Body Dressing Details (indicate cue type and reason): Pt stating she could not do LB ADLs Toilet Transfer Details (indicate cue type and reason): deferred due to safety need to futher assess Toileting - Clothing Manipulation Details (indicate cue type and reason): deferred due to safety  need to futher assess Tub/Shower Transfer Details (indicate cue type and reason): deferred due to safety need to futher assess Functional mobility during ADLs: +2 for physical assistance, +2 for safety/equipment, Moderate assistance General ADL Comments: Mod A +2 for bed mobility. Limited eval due to language barrier and pt participation    Mobility  Overal bed mobility: Needs Assistance Bed Mobility: Rolling, Supine to Sit, Sit to Supine Rolling: Min assist, Mod assist Supine to sit: Max assist, HOB elevated Sit to supine: Max assist General bed mobility comments: Cues for sequencing, assist with BLE and trunk, increased time. Rolling R min assist. Rolling L mod assist.    Transfers  Overall transfer level: Needs assistance Transfer via Lift Equipment: Stedy Transfers: Sit to/from Stand Sit to Stand: +2 physical assistance, Max assist General transfer comment: use of bed pads to lift hips. Pt able to assist by pulling up with LUE. Good effort noted. Dependent transfer OOB with stedy.    Ambulation / Gait / Stairs / Wheelchair Mobility  Ambulation/Gait General Gait Details: unable    Posture / Balance Dynamic Sitting Balance Sitting balance - Comments: mod assist to maintain balance EOB Balance Overall balance assessment: Needs assistance Sitting-balance support: Single extremity supported, Feet supported Sitting balance-Leahy Scale: Poor Sitting balance - Comments: mod assist to maintain balance EOB Postural control: Posterior lean    Special needs/care consideration  Special service needs: [place] Speaking    and Designated visitor: Daughter Saline (from acute therapy documentation) Living Arrangements: Children  Lives With: Daughter Type of Home: House Home Layout: One level Home Access: Stairs to enter Entrance Stairs-Rails: None Technical brewer of Steps: 2 Bathroom Shower/Tub: Chiropodist: Standard Home Care  Services: No Additional Comments: limited in questions due to communication barriers. Some above information was taken from prior admission but pt unable to confirm if it is still correct.   Discharge Living Setting Plans for Discharge Living Setting: House, Lives with (comment) (lives with daughter, son-in-law, and grandchildren. ) Type of Home at Discharge: House Discharge Home Layout: One level Discharge Home Access: Stairs to enter Entrance Stairs-Rails: None Entrance Stairs-Number of Steps: 3 Discharge Bathroom Shower/Tub: Tub/shower unit Discharge Bathroom Toilet: Standard Discharge Bathroom Accessibility: Yes How Accessible: Accessible via walker Does the patient have any problems obtaining your medications?: No  Social/Family/Support Systems Patient Roles: Other (Comment) (has close family support) Contact Information: daughter Marti Sleigh): [phone] (speaks [place]) Anticipated Caregiver: [name] + son-in law Anticipated Ambulance person Information: see above ([name] works nights and her husband works days so someone can be there 24/7 for assist) Ability/Limitations of Caregiver: Mod A Caregiver Availability: 24/7 Discharge Plan Discussed with Primary Caregiver: Yes (  [name]) Is Caregiver In Agreement with Plan?: Yes Does Caregiver/Family have Issues with Lodging/Transportation while Pt is in Rehab?: No   Goals Patient/Family Goal for Rehab: PT/OT: Mod A; SLP: Min A Expected length of stay: 10-14 days Additional Information: Will need Cambodian intrepreter  Pt/Family Agrees to Admission and willing to participate: Yes Program Orientation Provided & Reviewed with Pt/Caregiver Including Roles  & Responsibilities: Yes (pt and [name])  Barriers to Discharge: Home environment access/layout  Barriers to Discharge Comments: steps to enter home.    Decrease burden of Care through IP rehab admission: NA   Possible need for SNF placement upon discharge: Not anticipated; pt has good family  support at DC and they understand the goal is to go home at a Moderate Assistance level (they have an understanding of what that would entail physically).    Patient Condition: This patient's medical and functional status has changed since the consult dated: 05/31/20 in which the Rehabilitation Physician determined and documented that the patient's condition is appropriate for intensive rehabilitative care in an inpatient rehabilitation facility. Medical changes are: see HPI for details.  Functional changes are: improvement in bed mobility from Mod A + 2 to Max A, and pt has now demonstrated an ability to transfer with Max A +2 and use of stedy . After evaluating the patient today and speaking with the Rehabilitation physician and acute team, the patient remains appropriate for inpatient rehab. Will admit to inpatient rehab tomorrow, once bed available on 06/02/20.  Preadmission Screen Completed By:  Raechel Ache, OT, 06/01/2020 5:19 PM ______________________________________________________________________   Discussed status with Dr. Letta Pate on 06/01/20 at 3:30PM and received approval for admission today.  Admission Coordinator:  Raechel Ache, time 3:30PM/Date 06/01/20.

## 2020-05-31 NOTE — Progress Notes (Signed)
STROKE TEAM PROGRESS NOTE   INTERVAL HISTORY Daughter is at bedside. Pt lying in bed, however, had developed right hemiplegia over night. Concerning for extension of current stroke, pending MRI repeat. Daughter is considering CIR but not SNF.    Vitals:   05/30/20 2347 05/31/20 0402 05/31/20 0742 05/31/20 1207  BP: (!) 160/61 (!) 147/71 (!) 170/93 139/80  Pulse: 74 62 95 81  Resp: (!) 22 15 18 20   Temp: 98.7 F (37.1 C) 97.7 F (36.5 C) 98.2 F (36.8 C) 98.2 F (36.8 C)  TempSrc: Oral Oral Axillary Oral  SpO2: 94% 99% 98% 97%  Weight:      Height:        CBC:  Recent Labs  Lab 05/29/20 2143 05/29/20 2150  WBC  --  7.0  NEUTROABS  --  3.0  HGB 13.3 12.6  HCT 39.0 40.8  MCV  --  83.8  PLT  --  212    Basic Metabolic Panel:  Recent Labs  Lab 05/29/20 2143 05/29/20 2150  NA 140 137  K 3.5 3.5  CL 101 103  CO2  --  24  GLUCOSE 95 100*  BUN 11 5*  CREATININE 0.90 1.03*  CALCIUM  --  8.5*   Lipid Panel:     Component Value Date/Time   CHOL 204 (H) 05/30/2020 0425   TRIG 139 05/30/2020 0425   HDL 40 (L) 05/30/2020 0425   CHOLHDL 5.1 05/30/2020 0425   VLDL 28 05/30/2020 0425   LDLCALC 136 (H) 05/30/2020 0425   HgbA1c:  Lab Results  Component Value Date   HGBA1C 6.1 (H) 05/30/2020   Urine Drug Screen: No results found for: LABOPIA, COCAINSCRNUR, LABBENZ, AMPHETMU, THCU, LABBARB  Alcohol Level No results found for: ETH  IMAGING past 24 hours No results found.  PHYSICAL EXAM  Temp:  [97.7 F (36.5 C)-98.8 F (37.1 C)] 98.2 F (36.8 C) (07/01 1207) Pulse Rate:  [62-95] 81 (07/01 1207) Resp:  [15-22] 20 (07/01 1207) BP: (139-170)/(61-96) 139/80 (07/01 1207) SpO2:  [92 %-99 %] 97 % (07/01 1207)  General - Well nourished, well developed, in no apparent distress.  Ophthalmologic - fundi not visualized due to noncooperation.  Cardiovascular - Regular rhythm and rate.  Neuro - Awake alert, orientated to place and people, DOB, but not to time or age.  Moderate dysarthria but no aphasia, not cooperative on repetition but able to name 4/4. No gaze palsy, attending to both sides. Visual field full. PERRL. Right nasolabial fold mild flattening. RUE 0/5. RLE 1/5 with pain stimulation. LUE 5/5, LLE proximal and knee extension 3/5, distal PF/DF 4/5. No babinski. Sensation symmetrical, FTN intact grossly, gait not tested.    ASSESSMENT/PLAN [name] is a 86 y.o. female with history of HTN, HLD, Stroke in 2018 w/ residual LLE weakness presenting with R arm weakness, difficulty walking and slurred speech. BP elevated 180-190s. Out of window for tPA. No LVO.  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 Small vessel disease. Atrophy. Multiple old B basal ganglia lacunes. ASPECTS 10.     MRI  L pontine infarct. Small vessel disease. Atrophy. Old B basal ganglia and R thalamic lacunes.   MRI repeat pending  MRA  Motion. No LVO. Mid BA moderate stenosis. Scattered intracranial atherosclerosis.   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 BA 30% stenosis  2D Echo EF > 75%  LDL 136  HgbA1c pending   Lovenox 30 mg sq daily for VTE prophylaxis  No antithrombotics prior to admission, now on aspirin 81 mg daily and clopidogrel 75 mg daily. Continue DAPT for 3 weeks and then ASA alone.  Therapy recommendations:  CIR  Disposition:  pending   Hx stroke/TIA  03/2017 - LLE weakness. MRI R PLIC infarct secondary to small vessel disease source. MRA and CUS neg. EF 60-65%. LDL 110 and A1C 5.6. Asa 325 and zocor 10. HH therapies.  Follow[facility] Lucia Coleman [facility]  R calvarial lytic lesion  CT head - R frontal calvarium lytic lesion, present 2018 but less so.   MRI - 2.8cm R frontal calvaium mass w/ extraosseous extension seen in 2018 but less so.   CTA head and neck - Right frontal calvarial lytic mass as described by MRI.  Differential diagnosis is primarily metastatic disease versus myeloma versus intra osseous meningioma versus enlarging hemangioma.  Recommend outpt neurosurgery referral  Hypertensive Urgency  SBP 180-190s on arrival  Improving . Gradually normalize in 2-3 days . Long-term BP goal normotensive  Hyperlipidemia  Home meds:  none  Now on liptitor 40  LDL 136, goal < 70  Continue statin at discharge  Dysphagia . Secondary to stroke . dys 1 and nectar thick ->MBS today -> dys 2 and nectar thick . Speech on board   Other Stroke Risk Factors  Advanced age  Obesity, Body mass index is 30.59 kg/m., recommend weight loss, diet and exercise as appropriate   Other Active Problems  Medication noncompliance  Loss of follow up  Hospital day # 2  Patient condition worsened within the last 24 hours, has developed worsening right sided weakness, concerning to have pontine infarct extension, and I ordered repeat MRI. I discussed with Dr. Rhona Leavens. I spent  35 minutes in total face-to-face time with the patient, more than 50% of which was spent in counseling and coordination of care, reviewing test results, images and medication, and discussing the diagnosis, treatment plan and potential prognosis. This patient's care requiresreview of multiple databases, neurological assessment, discussion with family, other specialists and medical decision making of high complexity. I had long discussion with daughter at bedside, updated pt current condition, treatment plan and potential prognosis, and answered all the questions. She expressed understanding and appreciation.    Marvel Plan, MD PhD Stroke Neurology 05/31/2020 3:31 PM   To contact Stroke Continuity provider, please refer to [email or web address]. After hours, contact General Neurology

## 2020-05-31 NOTE — Plan of Care (Signed)
  Problem: Education: Goal: Knowledge of disease or condition will improve Outcome: Progressing   

## 2020-05-31 NOTE — Progress Notes (Signed)
Inpatient Rehab Admissions:   I met with pt and her daughter at the bedside as follow up from PM&R MD consult completed by Dr. Ranell Patrick. Please see formal consult note for details. Reviewed recommended rehab program, including expectations, anticipated LOS, expected functional outcomes, and support recommended at DC.  Pt's daughter confirmed interest in the program and confirmed DC support. AC will follow up with pt and her daughter tomorrow for possible admit to IP Rehab, pending bed availability and medical readiness.   Please call if questions.   Raechel Ache, OTR/L  Rehab Admissions Coordinator  [phone] 05/31/2020 6:15 PM

## 2020-05-31 NOTE — Progress Notes (Signed)
PROGRESS NOTE    Ann Coleman  ZOX:[id number] DOB: 16-Dec-1923 DOA: 05/29/2020 PCP: Patient, No Pcp Per    Brief Narrative:  86 y.o. [name] speaking female with medical history significant of stroke in 2018, hypertension, hyperlipidemia presenting to the ED via EMS as code stroke.  Daughter at bedside states patient was doing well during the day and around 4 PM today she could not get up to use the bathroom as her legs were weak.  Family also noticed that her speech was slurred.  When EMS arrived, they noticed that patient had right arm drift.  Daughter states patient is doing well otherwise and does not take any medications at home.  No complaints of fevers, cough, shortness of breath, chest pain, nausea, vomiting, abdominal pain, or diarrhea.  ED Course: Hypertensive with systolic in the 190s.  Stat Head CT negative for acute intracranial infarct.  Showing an approximate 2.5 cm lytic lesion involving the right frontal calvarium with possible associated pathologic fracture.  Finding is felt to be nonspecific, suspected to have been present on previous CT from 2018, although only faintly visible at that time.  Brain MRI recommended for further evaluation.  Patient was seen by neurology.  tPA not given as she was outside the window at the time of presentation.  Due to focal symptoms, stat brain MRI was ordered and reviewed by neurology -showing an acute left pontine stroke which is felt to be the likely explanation for her right-sided weakness.  Assessment & Plan:   Principal Problem:   Acute CVA (cerebrovascular accident) (HCC) Active Problems:   Hypertensive urgency   HLD (hyperlipidemia)   Acute CVA:  -Presented with slurred speech, lower extremity weakness, and right arm drift.  tPA was not given as she was outside the window at the time of presentation.  Head CT negative for acute intracranial infarct.  Brain MRI reviewed, confirmed acute left pontine stroke.  -2D echocardiogram with normal  LVEF -Hemoglobin A1c pending -LDL of 136 -Per Neurology, plan for aspirin  with plavix x 3 weeks, then ASA alone -Atorvastatin 40 mg daily -Frequent neurochecks -PT, OT, speech therapy. -Currently on dysphagia 2 diet per SLP -Therapy recs for CIR -This AM, pt noted to have worse R sided weakness. Repeat MRI brain pending  Right calvarial lytic lesion:  -Showing an approximate 2.5 cm lytic lesion involving the right frontal calvarium with possible associated pathologic fracture.  Finding is felt to be nonspecific, suspected to have been present on previous CT from 2018, although only faintly visible at that time.   -discussed with Neurology, recommendation to have this lesion f/u as outpatient  Hypertensive urgency: Patient is not taking any antihypertensives at home.  Blood pressure significantly elevated on arrival with systolic in the 190s.  Systolic now down to 130s. -Plan to gradually normalize BP as tolerated  Hyperlipidemia:  -Start high intensity statin given acute stroke per above  DVT prophylaxis: Lovenox subq Code Status: DNR Family Communication: Pt in room, daughter at bedside  Status is: Inpatient  Remains inpatient appropriate because:Ongoing diagnostic testing needed not appropriate for outpatient work up   Dispo: The patient is from: Home              Anticipated d/c is to: Home vs CIR              Anticipated d/c date is: 1 day              Patient currently is not medically stable to d/c.  Consultants:   Neurology  Procedures:     Antimicrobials: Anti-infectives (From admission, onward)   None      Subjective: Without complaints  Objective: Vitals:   05/31/20 0402 05/31/20 0742 05/31/20 1207 05/31/20 1533  BP: (!) 147/71 (!) 170/93 139/80 (!) 160/62  Pulse: 62 95 81 72  Resp: 15 18 20 20   Temp: 97.7 F (36.5 C) 98.2 F (36.8 C) 98.2 F (36.8 C) 98.4 F (36.9 C)  TempSrc: Oral Axillary Oral Oral  SpO2: 99% 98% 97% 96%    Weight:      Height:        Intake/Output Summary (Last 24 hours) at 05/31/2020 1605 Last data filed at 05/31/2020 1300 Gross per 24 hour  Intake 460 ml  Output 350 ml  Net 110 ml   Filed Weights   05/29/20 2146 05/30/20 0107  Weight: 64 kg 68.7 kg    Examination: General exam: Awake, laying in bed, in nad Respiratory system: Normal respiratory effort, no wheezing Cardiovascular system: regular rate, s1, s2 Gastrointestinal system: Soft, nondistended, positive BS Central nervous system: CN2-12 grossly intact, strength intact Extremities: Perfused, no clubbing Skin: Normal skin turgor, no notable skin lesions seen Psychiatry: Mood normal // no visual hallucinations   Data Reviewed: I have personally reviewed following labs and imaging studies  CBC: Recent Labs  Lab 05/29/20 2143 05/29/20 2150  WBC  --  7.0  NEUTROABS  --  3.0  HGB 13.3 12.6  HCT 39.0 40.8  MCV  --  83.8  PLT  --  212   Basic Metabolic Panel: Recent Labs  Lab 05/29/20 2143 05/29/20 2150  NA 140 137  K 3.5 3.5  CL 101 103  CO2  --  24  GLUCOSE 95 100*  BUN 11 5*  CREATININE 0.90 1.03*  CALCIUM  --  8.5*   GFR: Estimated Creatinine Clearance: 28.2 mL/min (A) (by C-G formula based on SCr of 1.03 mg/dL (H)). Liver Function Tests: Recent Labs  Lab 05/29/20 2150  AST 35  ALT 27  ALKPHOS 72  BILITOT 0.8  PROT 7.2  ALBUMIN 3.2*   No results for input(s): LIPASE, AMYLASE in the last 168 hours. No results for input(s): AMMONIA in the last 168 hours. Coagulation Profile: Recent Labs  Lab 05/29/20 2150  INR 1.1   Cardiac Enzymes: No results for input(s): CKTOTAL, CKMB, CKMBINDEX, TROPONINI in the last 168 hours. BNP (last 3 results) No results for input(s): PROBNP in the last 8760 hours. HbA1C: Recent Labs    05/30/20 0425  HGBA1C 6.1*   CBG: No results for input(s): GLUCAP in the last 168 hours. Lipid Profile: Recent Labs    05/30/20 0425  CHOL 204*  HDL 40*  LDLCALC 136*   TRIG 139  CHOLHDL 5.1   Thyroid Function Tests: No results for input(s): TSH, T4TOTAL, FREET4, T3FREE, THYROIDAB in the last 72 hours. Anemia Panel: No results for input(s): VITAMINB12, FOLATE, FERRITIN, TIBC, IRON, RETICCTPCT in the last 72 hours. Sepsis Labs: No results for input(s): PROCALCITON, LATICACIDVEN in the last 168 hours.  Recent Results (from the past 240 hour(s))  SARS Coronavirus 2 by RT PCR (hospital order, performed in [facility] hospital lab) Nasopharyngeal Nasopharyngeal Swab     Status: None   Collection Time: 05/29/20 10:55 PM   Specimen: Nasopharyngeal Swab  Result Value Ref Range Status   SARS Coronavirus 2 NEGATIVE NEGATIVE Final    Comment: (NOTE) SARS-CoV-2 target nucleic acids are NOT DETECTED.  The SARS-CoV-2 RNA is  generally detectable in upper and lower respiratory specimens during the acute phase of infection. The lowest concentration of SARS-CoV-2 viral copies this assay can detect is 250 copies / mL. A negative result does not preclude SARS-CoV-2 infection and should not be used as the sole basis for treatment or other patient management decisions.  A negative result may occur with improper specimen collection / handling, submission of specimen other than nasopharyngeal swab, presence of viral mutation(s) within the areas targeted by this assay, and inadequate number of viral copies (<250 copies / mL). A negative result must be combined with clinical observations, patient history, and epidemiological information.  Fact Sheet for Patients:   BoilerBrush.com.cy  Fact Sheet for Healthcare Providers: https://pope.com/  This test is not yet approved or  cleared by the [place] FDA and has been authorized for detection and/or diagnosis of SARS-CoV-2 by FDA under an Emergency Use Authorization (EUA).  This EUA will remain in effect (meaning this test can be used) for the duration of the COVID-19  declaration under Section 564(b)(1) of the Act, 21 U.S.C. section 360bbb-3(b)(1), unless the authorization is terminated or revoked sooner.  Performed at [facility], [address] [place], [place] [address]      Radiology Studies: CT ANGIO HEAD W OR WO CONTRAST  Addendum Date: 05/30/2020   ADDENDUM REPORT: 05/30/2020 13:23 ADDENDUM: Enlarged heterogeneous thyroid gland. Largest single nodule 2 cm. recommend thyroid ultrasound (ref: J Am Coll Radiol. 2015 Feb;12(2): 143-50). Electronically Signed   By: Paulina Fusi M.D.   On: 05/30/2020 13:23   Result Date: 05/30/2020 CLINICAL DATA:  Acute presentation with slurred speech and leg weakness. Small left pontine infarction suspected by MRI. EXAM: CT ANGIOGRAPHY HEAD AND NECK TECHNIQUE: Multidetector CT imaging of the head and neck was performed using the standard protocol during bolus administration of intravenous contrast. Multiplanar CT image reconstructions and MIPs were obtained to evaluate the vascular anatomy. Carotid stenosis measurements (when applicable) are obtained utilizing NASCET criteria, using the distal internal carotid diameter as the denominator. CONTRAST:  75mL OMNIPAQUE IOHEXOL 350 MG/ML SOLN COMPARISON:  MR studies done yesterday. FINDINGS: CTA NECK FINDINGS Aortic arch: Aortic atherosclerosis. No aneurysm or brachiocephalic vessel origin stenosis. 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 Right carotid system: Common carotid artery is widely patent to the bifurcation region. Atherosclerotic plaque at carotid bifurcation and ICA bulb but without stenosis compared to the more distal cervical ICA diameter. Left carotid system: Common carotid artery widely patent to the bifurcation. Atherosclerotic plaque at the carotid bifurcation and ICA bulb but without stenosis compared to the more distal cervical ICA diameter. Vertebral arteries: Both vertebral artery origins are widely patent. Both  vertebral arteries are widely patent through the cervical region to the foramen magnum. Skeleton: Ordinary cervical spondylosis. Right frontal calvarial mass as described at previous MRI. Other neck: No lymphadenopathy. Enlarged heterogeneous thyroid gland with multiple nodules, the largest measuring 2.1 cm. Upper chest: Negative Review of the MIP images confirms the above findings CTA HEAD FINDINGS Anterior circulation: Both internal carotid arteries are patent through the skull base and siphon regions. Ordinary siphon atherosclerotic calcification but without stenosis greater than 30%. The anterior and middle cerebral vessels are patent without correctable proximal stenosis. The anterior temporal branch of the MCA on the left appears to be occluded. Posterior circulation: Both vertebral arteries are widely patent to the basilar. Mild atherosclerotic irregularity of the basilar artery with distal basilar narrowing of about 30%.  Superior cerebellar and posterior cerebral arteries are patent. Distal PCA branch vessels show atherosclerotic irregularity. Venous sinuses: Patent and normal. Anatomic variants: None other significant. Review of the MIP images confirms the above findings IMPRESSION: Aortic atherosclerosis. 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 Atherosclerotic change at both carotid bifurcations but no stenosis. Atherosclerotic change of the carotid siphon regions but without stenosis greater than 30%. 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 This is probably chronic, similar to the MR angiography of 2018. Distal vessel atherosclerotic narrowing and irregularity of the MCA and PCA branches. Atherosclerotic irregularity of the basilar artery, maximal stenosis in the distal basilar region of approximately 30%. Right frontal calvarial lytic mass as described by MRI. Differential diagnosis is primarily metastatic disease  versus myeloma versus intra osseous meningioma versus enlarging hemangioma. Electronically Signed: By: Paulina Fusi M.D. On: 05/30/2020 12:02   CT ANGIO NECK W OR WO CONTRAST  Addendum Date: 05/30/2020   ADDENDUM REPORT: 05/30/2020 13:23 ADDENDUM: Enlarged heterogeneous thyroid gland. Largest single nodule 2 cm. recommend thyroid ultrasound (ref: J Am Coll Radiol. 2015 Feb;12(2): 143-50). Electronically Signed   By: Paulina Fusi M.D.   On: 05/30/2020 13:23   Result Date: 05/30/2020 CLINICAL DATA:  Acute presentation with slurred speech and leg weakness. Small left pontine infarction suspected by MRI. EXAM: CT ANGIOGRAPHY HEAD AND NECK TECHNIQUE: Multidetector CT imaging of the head and neck was performed using the standard protocol during bolus administration of intravenous contrast. Multiplanar CT image reconstructions and MIPs were obtained to evaluate the vascular anatomy. Carotid stenosis measurements (when applicable) are obtained utilizing NASCET criteria, using the distal internal carotid diameter as the denominator. CONTRAST:  82mL OMNIPAQUE IOHEXOL 350 MG/ML SOLN COMPARISON:  MR studies done yesterday. FINDINGS: CTA NECK FINDINGS Aortic arch: Aortic atherosclerosis. No aneurysm or brachiocephalic vessel origin stenosis. 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 Right carotid system: Common carotid artery is widely patent to the bifurcation region. Atherosclerotic plaque at carotid bifurcation and ICA bulb but without stenosis compared to the more distal cervical ICA diameter. Left carotid system: Common carotid artery widely patent to the bifurcation. Atherosclerotic plaque at the carotid bifurcation and ICA bulb but without stenosis compared to the more distal cervical ICA diameter. Vertebral arteries: Both vertebral artery origins are widely patent. Both vertebral arteries are widely patent through the cervical region to the foramen magnum. Skeleton: Ordinary  cervical spondylosis. Right frontal calvarial mass as described at previous MRI. Other neck: No lymphadenopathy. Enlarged heterogeneous thyroid gland with multiple nodules, the largest measuring 2.1 cm. Upper chest: Negative Review of the MIP images confirms the above findings CTA HEAD FINDINGS Anterior circulation: Both internal carotid arteries are patent through the skull base and siphon regions. Ordinary siphon atherosclerotic calcification but without stenosis greater than 30%. The anterior and middle cerebral vessels are patent without correctable proximal stenosis. The anterior temporal branch of the MCA on the left appears to be occluded. Posterior circulation: Both vertebral arteries are widely patent to the basilar. Mild atherosclerotic irregularity of the basilar artery with distal basilar narrowing of about 30%. Superior cerebellar and posterior cerebral arteries are patent. Distal PCA branch vessels show atherosclerotic irregularity. Venous sinuses: Patent and normal. Anatomic variants: None other significant. Review of the MIP images confirms the above findings IMPRESSION: Aortic atherosclerosis. 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 Atherosclerotic change at  both carotid bifurcations but no stenosis. Atherosclerotic change of the carotid siphon regions but without stenosis greater than 30%. 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 This is probably chronic, similar to the MR angiography of 2018. Distal vessel atherosclerotic narrowing and irregularity of the MCA and PCA branches. Atherosclerotic irregularity of the basilar artery, maximal stenosis in the distal basilar region of approximately 30%. Right frontal calvarial lytic mass as described by MRI. Differential diagnosis is primarily metastatic disease versus myeloma versus intra osseous meningioma versus enlarging hemangioma. Electronically Signed: By: Paulina Fusi M.D. On: 05/30/2020 12:02   MR ANGIO HEAD WO CONTRAST  Result Date: 05/30/2020 CLINICAL DATA:  Initial evaluation for acute stroke, slurred speech, right-sided weakness. EXAM: MRI HEAD WITHOUT CONTRAST MRA HEAD WITHOUT CONTRAST TECHNIQUE: Multiplanar, multiecho pulse sequences of the brain and surrounding structures were obtained without intravenous contrast. Angiographic images of the head were obtained using MRA technique without contrast. COMPARISON:  Prior CT from earlier same day as well as previous MRI from 03/04/2017. FINDINGS: MRI HEAD FINDINGS Brain: Examination significantly degraded by motion artifact. Diffuse prominence of the CSF containing spaces compatible generalized age-related cerebral atrophy. Patchy T2/FLAIR hyperintensity within the periventricular and deep white matter both cerebral hemispheres most consistent with chronic small vessel ischemic disease, moderate in nature. Multiple remote lacunar infarcts present about the bilateral basal ganglia and right thalamus. Subtle approximate 6 mm focus of diffusion abnormality seen involving the left paramedian pons, suspicious for an acute ischemic infarct (series 2, image 15). No visible associated hemorrhage or mass effect on this motion degraded exam. No other diffusion abnormality to suggest acute or subacute ischemia. Gray-white matter differentiation otherwise maintained. No other areas of remote cortical infarction. No foci of susceptibility artifact to suggest acute or chronic intracranial hemorrhage on this motion degraded exam. 2.8 cm mass involving the right frontal calvarium with extraosseous extension again seen, indeterminate. No associated mass effect on the subjacent right frontal lobe. No other mass lesion. No mass effect or midline shift. No hydrocephalus or extra-axial fluid collection. Pituitary gland grossly within normal limits. Midline structures intact. Vascular: Major intracranial vascular flow voids are  maintained. Skull and upper cervical spine: Craniocervical junction within normal limits. Upper cervical spine normal. Bone marrow signal intensity within normal limits. No other focal marrow replacing lesions. Scalp soft tissues otherwise unremarkable. Sinuses/Orbits: Globes and orbital soft tissues within normal limits. Visualized paranasal sinuses are grossly clear. No significant mastoid effusion. Other: None. MRA HEAD FINDINGS ANTERIOR CIRCULATION: Examination moderately degraded by motion artifact. Visualized distal cervical segments of the internal carotid arteries are patent with antegrade flow. Petrous segments widely patent. Scattered atheromatous irregularity throughout the cavernous/supraclinoid ICAs without hemodynamically significant stenosis. Right A1 segment widely patent. Left A1 hypoplastic and/or absent, accounting for the diminutive left ICA is compared to the right. Normal anterior communicating artery complex. Anterior cerebral arteries patent to their distal aspects without appreciable stenosis. M1 segments patent bilaterally. Grossly normal MCA bifurcations. Distal MCA branches well perfused and fairly symmetric. POSTERIOR CIRCULATION: Vertebral arteries patent to the vertebrobasilar junction without stenosis. Left vertebral artery slightly dominant. Neither PICA origin visualized. Basilar widely patent proximally. There is a short-segment moderate stenosis involving the mid basilar artery (series 2, image 91). Basilar otherwise patent to its distal aspect. Superior cerebral arteries grossly patent proximally. PCAs primarily supplied via the basilar, both of which are grossly patent to their distal aspects without definite stenosis, although evaluation limited by motion. No intracranial aneurysm. IMPRESSION:  MRI HEAD IMPRESSION: 1. Motion degraded exam. 2. Subtle approximate 6 mm acute ischemic nonhemorrhagic left pontine infarct. 3. 2.8 cm mass involving the right frontal calvarium with  extraosseous extension, indeterminate. In retrospect, this lesion appears to have been present on prior MRI from 2018, although is [place]ia larger on today's exam. Again, primary differential considerations include a possible intra osseous meningioma versus osseous metastasis. Neuro surgical consultation and referral for consideration of possible histologic sampling suggested for further evaluation. Additionally, postcontrast imaging could be performed for complete evaluation as clinically desired. 4. Underlying age-related cerebral atrophy with moderate chronic microvascular ischemic disease, with multiple remote lacunar infarcts about the bilateral basal ganglia and right thalamus. MRA HEAD IMPRESSION: 1. Motion degraded exam. 2. Negative intracranial MRA for large vessel occlusion. 3. 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 4. Additional scattered intracranial atherosclerotic irregularity, with no other hemodynamically significant or correctable stenosis identified. Electronically Signed   By: Rise Mu M.D.   On: 05/30/2020 00:34   MR BRAIN WO CONTRAST  Result Date: 05/30/2020 CLINICAL DATA:  Initial evaluation for acute stroke, slurred speech, right-sided weakness. EXAM: MRI HEAD WITHOUT CONTRAST MRA HEAD WITHOUT CONTRAST TECHNIQUE: Multiplanar, multiecho pulse sequences of the brain and surrounding structures were obtained without intravenous contrast. Angiographic images of the head were obtained using MRA technique without contrast. COMPARISON:  Prior CT from earlier same day as well as previous MRI from 03/04/2017. FINDINGS: MRI HEAD FINDINGS Brain: Examination significantly degraded by motion artifact. Diffuse prominence of the CSF containing spaces compatible generalized age-related cerebral atrophy. Patchy T2/FLAIR hyperintensity within the periventricular and deep white matter both cerebral hemispheres most consistent with chronic small vessel ischemic disease,  moderate in nature. Multiple remote lacunar infarcts present about the bilateral basal ganglia and right thalamus. Subtle approximate 6 mm focus of diffusion abnormality seen involving the left paramedian pons, suspicious for an acute ischemic infarct (series 2, image 15). No visible associated hemorrhage or mass effect on this motion degraded exam. No other diffusion abnormality to suggest acute or subacute ischemia. Gray-white matter differentiation otherwise maintained. No other areas of remote cortical infarction. No foci of susceptibility artifact to suggest acute or chronic intracranial hemorrhage on this motion degraded exam. 2.8 cm mass involving the right frontal calvarium with extraosseous extension again seen, indeterminate. No associated mass effect on the subjacent right frontal lobe. No other mass lesion. No mass effect or midline shift. No hydrocephalus or extra-axial fluid collection. Pituitary gland grossly within normal limits. Midline structures intact. Vascular: Major intracranial vascular flow voids are maintained. Skull and upper cervical spine: Craniocervical junction within normal limits. Upper cervical spine normal. Bone marrow signal intensity within normal limits. No other focal marrow replacing lesions. Scalp soft tissues otherwise unremarkable. Sinuses/Orbits: Globes and orbital soft tissues within normal limits. Visualized paranasal sinuses are grossly clear. No significant mastoid effusion. Other: None. MRA HEAD FINDINGS ANTERIOR CIRCULATION: Examination moderately degraded by motion artifact. Visualized distal cervical segments of the internal carotid arteries are patent with antegrade flow. Petrous segments widely patent. Scattered atheromatous irregularity throughout the cavernous/supraclinoid ICAs without hemodynamically significant stenosis. Right A1 segment widely patent. Left A1 hypoplastic and/or absent, accounting for the diminutive left ICA is compared to the right. Normal  anterior communicating artery complex. Anterior cerebral arteries patent to their distal aspects without appreciable stenosis. M1 segments patent bilaterally. Grossly normal MCA bifurcations. Distal MCA branches well perfused and fairly symmetric. POSTERIOR CIRCULATION: Vertebral arteries patent to the vertebrobasilar junction without stenosis. Left vertebral artery slightly dominant.  Neither PICA origin visualized. Basilar widely patent proximally. There is a short-segment moderate stenosis involving the mid basilar artery (series 2, image 91). Basilar otherwise patent to its distal aspect. Superior cerebral arteries grossly patent proximally. PCAs primarily supplied via the basilar, both of which are grossly patent to their distal aspects without definite stenosis, although evaluation limited by motion. No intracranial aneurysm. IMPRESSION: MRI HEAD IMPRESSION: 1. Motion degraded exam. 2. Subtle approximate 6 mm acute ischemic nonhemorrhagic left pontine infarct. 3. 2.8 cm mass involving the right frontal calvarium with extraosseous extension, indeterminate. In retrospect, this lesion appears to have been present on prior MRI from 2018, although is [place] larger on today's exam. Again, primary differential considerations include a possible intra osseous meningioma versus osseous metastasis. Neuro surgical consultation and referral for consideration of possible histologic sampling suggested for further evaluation. Additionally, postcontrast imaging could be performed for complete evaluation as clinically desired. 4. Underlying age-related cerebral atrophy with moderate chronic microvascular ischemic disease, with multiple remote lacunar infarcts about the bilateral basal ganglia and right thalamus. MRA HEAD IMPRESSION: 1. Motion degraded exam. 2. Negative intracranial MRA for large vessel occlusion. 3. 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 4. Additional scattered intracranial  atherosclerotic irregularity, with no other hemodynamically significant or correctable stenosis identified. Electronically Signed   By: Rise Mu M.D.   On: 05/30/2020 00:34   ECHOCARDIOGRAM COMPLETE  Result Date: 05/30/2020    ECHOCARDIOGRAM REPORT   Patient Name:   Ann Coleman    Date of Exam: 05/30/2020 Medical Rec #:  [id number]  Height:       59.0 in Accession #:    [id number] Weight:       151.5 lb Date of Birth:  1924/11/03   BSA:          1.639 m Patient Age:    89 years   BP:           136/101 mmHg Patient Gender: F          HR:           75 bpm. Exam Location:  Inpatient Procedure: 2D Echo, Cardiac Doppler and Color Doppler Indications:    Stroke 434.91 / I163.9  History:        Patient has prior history of Echocardiogram examinations, most                 recent 03/06/2017. Risk Factors:Hypertension and Dyslipidemia.  Sonographer:    Elmarie Shiley Dance Referring Phys: [id number] VASUNDHRA RATHORE IMPRESSIONS  1. Intracavitary gradient. Peak velocity 2.1 m/s. Peak gradient 17.6 mmHg. Left ventricular ejection fraction, by estimation, is >75%. The left ventricle has hyperdynamic function. The left ventricle has no regional wall motion abnormalities. There is mild concentric left ventricular hypertrophy. Left ventricular diastolic parameters are consistent with Grade I diastolic dysfunction (impaired relaxation).  2. Right ventricular systolic function is normal. The right ventricular size is normal. There is normal pulmonary artery systolic pressure.  3. Left atrial size was mildly dilated.  4. The mitral valve is normal in structure. No evidence of mitral valve regurgitation. No evidence of mitral stenosis.  5. The aortic valve is tricuspid. Aortic valve regurgitation is mild. No aortic stenosis is present.  6. The inferior vena cava is normal in size with greater than 50% respiratory variability, suggesting right atrial pressure of 3 mmHg. FINDINGS  Left Ventricle: Intracavitary gradient. Peak velocity 2.1  m/s. Peak gradient 17.6 mmHg. Left ventricular ejection fraction, by estimation, is >75%. The left ventricle  has hyperdynamic function. The left ventricle has no regional wall motion abnormalities. The left ventricular internal cavity size was normal in size. There is mild concentric left ventricular hypertrophy. Left ventricular diastolic parameters are consistent with Grade I diastolic dysfunction (impaired relaxation). Right Ventricle: The right ventricular size is normal. No increase in right ventricular wall thickness. Right ventricular systolic function is normal. There is normal pulmonary artery systolic pressure. The tricuspid regurgitant velocity is 1.67 m/s, and  with an assumed right atrial pressure of 3 mmHg, the estimated right ventricular systolic pressure is 14.2 mmHg. Left Atrium: Left atrial size was mildly dilated. Right Atrium: Right atrial size was normal in size. Pericardium: A small pericardial effusion is present. There is no evidence of cardiac tamponade. Mitral Valve: The mitral valve is normal in structure. Normal mobility of the mitral valve leaflets. No evidence of mitral valve regurgitation. No evidence of mitral valve stenosis. Tricuspid Valve: The tricuspid valve is normal in structure. Tricuspid valve regurgitation is trivial. No evidence of tricuspid stenosis. Aortic Valve: The aortic valve is tricuspid. Aortic valve regurgitation is mild. Aortic regurgitation PHT measures 300 msec. No aortic stenosis is present. Pulmonic Valve: The pulmonic valve was normal in structure. Pulmonic valve regurgitation is trivial. No evidence of pulmonic stenosis. Aorta: The aortic root is normal in size and structure. Venous: The inferior vena cava is normal in size with greater than 50% respiratory variability, suggesting right atrial pressure of 3 mmHg. IAS/Shunts: No atrial level shunt detected by color flow Doppler.  LEFT VENTRICLE PLAX 2D LVIDd:         3.21 cm LVIDs:         2.24 cm LV PW:          1.26 cm LV IVS:        1.20 cm LVOT diam:     2.00 cm LV SV:         75 LV SV Index:   46 LVOT Area:     3.14 cm  RIGHT VENTRICLE            IVC RV Basal diam:  2.16 cm    IVC diam: 1.53 cm RV S prime:     9.03 cm/s TAPSE (M-mode): 1.9 cm LEFT ATRIUM             Index       RIGHT ATRIUM           Index LA diam:        4.20 cm 2.56 cm/m  RA Area:     12.50 cm LA Vol (A2C):   32.7 ml 19.95 ml/m RA Volume:   27.70 ml  16.90 ml/m LA Vol (A4C):   50.2 ml 30.63 ml/m LA Biplane Vol: 41.9 ml 25.57 ml/m  AORTIC VALVE LVOT Vmax:   101.00 cm/s LVOT Vmean:  75.100 cm/s LVOT VTI:    0.238 m AI PHT:      300 msec  AORTA Ao Root diam: 2.90 cm Ao Asc diam:  3.50 cm MITRAL VALVE                TRICUSPID VALVE MV Area (PHT): 2.50 cm     TR Peak grad:   11.2 mmHg MV Decel Time: 303 msec     TR Vmax:        167.00 cm/s MV E velocity: 69.20 cm/s MV A velocity: 127.00 cm/s  SHUNTS MV E/A ratio:  0.54         Systemic VTI:  0.24 m                             Systemic Diam: 2.00 cm Chilton Siiffany [place] MD Electronically signed by Chilton Siiffany [place] MD Signature Date/Time: 05/30/2020/12:19:32 PM    Final    CT HEAD CODE STROKE WO CONTRAST  Result Date: 05/29/2020 CLINICAL DATA:  Code stroke. Initial evaluation for acute slurred speech, leg weakness. EXAM: CT HEAD WITHOUT CONTRAST TECHNIQUE: Contiguous axial images were obtained from the base of the skull through the vertex without intravenous contrast. COMPARISON:  Prior study from 03/04/2017. FINDINGS: Brain: Generalized age-related cerebral atrophy with moderate chronic microvascular ischemic disease. Scattered remote lacunar infarcts present about the bilateral basal ganglia and right internal capsule/thalamus. No acute intracranial hemorrhage. No acute large vessel territory infarct. No parenchymal mass lesion, mass effect, or midline shift. No hydrocephalus or extra-axial fluid collection. There is a hyperdense ovoid lesion measuring approximately 2.5 cm positioned within the  right frontal calvarium (series 3, image 18). Upon review of prior CT from 2018, this lesion was likely present, although faintly visible at that time. Associated lytic changes within the underlying calvarium with probable associated pathologic fracture (series 4, image 46). Soft tissue component extends into the overlying right frontal scalp as well. Vascular: No hyperdense vessel. Calcified atherosclerosis present at the skull base. Skull: 2.5 cm lytic lesion involving the right frontal calvarium as above. Calvarium otherwise intact. No other scalp soft tissue abnormality. Sinuses/Orbits: Globes and orbital soft tissues within normal limits. Scattered mucosal thickening noted within the visualized ethmoidal air cells. Visualized paranasal sinuses are otherwise clear. No mastoid effusion. Other: None. ASPECTS [facility]([place] Stroke Program Early CT Score) - Ganglionic level infarction (caudate, lentiform nuclei, internal capsule, insula, M1-M3 cortex): 7 - Supraganglionic infarction (M4-M6 cortex): 3 Total score (0-10 with 10 being normal): 10 IMPRESSION: 1. No acute intracranial infarct or other abnormality. 2. ASPECTS is 10. 3. Approximate 2.5 cm lytic lesion involving the right frontal calvarium with probable associated pathologic fracture. Finding is nonspecific, although is suspected to have been present on previous CT from 2018, although only faintly visible at that time. Primary differential consideration consists of an intra osseous meningioma, although possible osseous metastasis not excluded. Further evaluation with dedicated brain MRI, with and without contrast suggested for further evaluation. Additionally, neuro surgical consultation and referral for possible histologic sampling suggested. 4. Underlying age-related cerebral atrophy with moderate chronic microvascular ischemic disease, with multiple remote lacunar infarcts about the bilateral basal ganglia as above. Critical Value/emergent results were called  by telephone at the time of interpretation on 05/29/2020 at 10:32 pm to provider [facility] AR[facility] , who verbally acknowledged these results. Electronically Signed   By: Rise MuBenjamin  McClintock M.D.   On: 05/29/2020 22:36    Scheduled Meds: . aspirin EC  81 mg Oral Daily  . atorvastatin  40 mg Oral Daily  . clopidogrel  75 mg Oral Daily  . enoxaparin (LOVENOX) injection  30 mg Subcutaneous Q24H   Continuous Infusions:   LOS: 2 days   Rickey BarbaraStephen Magdalena Skilton, MD [facility] Pager On Amion  If 7PM-7AM, please contact night-coverage 05/31/2020, 4:05 PM

## 2020-05-31 NOTE — Progress Notes (Signed)
Modified Barium Swallow Progress Note  Patient Details  Name: Ann Coleman MRN: [id number] Date of Birth: June 07, 1924  Today's Date: 05/31/2020  Modified Barium Swallow completed.  Full report located under Chart Review in the Imaging Section.  Brief recommendations include the following:  Clinical Impression  Pt demonstrates a primary oral dysphagia with buccal weakness but strong lingual manipulation. With extra time she is albe to gather boluses over two piecemeal efforts for propulsion to pharynx. She has instances of trace oral residue with liquids that she does gather and propel, but does not immediately swallow, which resuleted in some trace sensed aspiration. Pharyngeal impairment is mostly characterized by delayed initiation with trace sensed aspriation of thin liquids before the swallow, seem with straw sips or very large sips. Pt is recommended to consume nectar thick drinks and soft ground (dys 2) solids (food from home permitted) and can also have teaspoons of thin soup. SLP will f/u, likely will be able to uprgrade to thin with further training for small sips and a dry swallow to fully clear residue.    Swallow Evaluation Recommendations       SLP Diet Recommendations: Dysphagia 2 (Fine chop) solids;Nectar thick liquid   Liquid Administration via: Cup;Straw   Medication Administration: Whole meds with liquid   Supervision: Staff to assist with self feeding;Full supervision/cueing for compensatory strategies   Compensations: Slow rate;Small sips/bites;Lingual sweep for clearance of pocketing;Multiple dry swallows after each bite/sip   Postural Changes: Remain semi-upright after after feeds/meals (Comment)   Oral Care Recommendations: Oral care BID        Jahzion Brogden, [name] 05/31/2020,11:55 AM

## 2020-06-01 NOTE — Progress Notes (Signed)
Inpatient Rehabilitation-Admissions Coordinator   Met with pt and her daughter in the room to continue CIR discussion. Used stratus interpreter to assist with language barrier initially but ultimately the daughter preferred to translate after pt gave permission. Confirmed continued interest in CIR and they have accepted bed offer for tomorrow. Reviewed insurance benefits letter and consent form. All questions answered.   I have approval from Dr. Wyline Copas for admission this Saturday, 06/02/20 to CIR. PM&R MD, Dr. Letta Pate will see patient Sauturday morning to confirm patient readiness. Pt's RN can call [facility] [phone]) by noon to get report.   Raechel Ache, OTR/L  Rehab Admissions Coordinator  [phone] 06/01/2020 3:05 PM'

## 2020-06-01 NOTE — Plan of Care (Signed)

## 2020-06-01 NOTE — Progress Notes (Signed)
Stroke booklet order, per order hx this "order does not apply".

## 2020-06-01 NOTE — Progress Notes (Signed)
PROGRESS NOTE    Ann Coleman  FXT:[id number] DOB: 05-Jan-1924 DOA: 05/29/2020 PCP: Patient, No Pcp Per    Brief Narrative:  86 y.o. [name] speaking female with medical history significant of stroke in 2018, hypertension, hyperlipidemia presenting to the ED via EMS as code stroke.  Daughter at bedside states patient was doing well during the day and around 4 PM today she could not get up to use the bathroom as her legs were weak.  Family also noticed that her speech was slurred.  When EMS arrived, they noticed that patient had right arm drift.  Daughter states patient is doing well otherwise and does not take any medications at home.  No complaints of fevers, cough, shortness of breath, chest pain, nausea, vomiting, abdominal pain, or diarrhea.  ED Course: Hypertensive with systolic in the 190s.  Stat Head CT negative for acute intracranial infarct.  Showing an approximate 2.5 cm lytic lesion involving the right frontal calvarium with possible associated pathologic fracture.  Finding is felt to be nonspecific, suspected to have been present on previous CT from 2018, although only faintly visible at that time.  Brain MRI recommended for further evaluation.  Patient was seen by neurology.  tPA not given as she was outside the window at the time of presentation.  Due to focal symptoms, stat brain MRI was ordered and reviewed by neurology -showing an acute left pontine stroke which is felt to be the likely explanation for her right-sided weakness.  Assessment & Plan:   Principal Problem:   Acute CVA (cerebrovascular accident) (HCC) Active Problems:   Hypertensive urgency   HLD (hyperlipidemia)   Acute CVA:  -Presented with slurred speech, lower extremity weakness, and right arm drift.  tPA was not given as she was outside the window at the time of presentation.  Head CT negative for acute intracranial infarct.  Brain MRI reviewed, confirmed acute left pontine stroke.  -2D echocardiogram with normal  LVEF -Hemoglobin A1c pending -LDL of 136 -Per Neurology, plan for aspirin 81mg  with plavix x 3 weeks, then ASA alone -Atorvastatin 40 mg daily -PT, OT, speech therapy. -Currently on dysphagia 2 diet per SLP -On 7/1, pt noted to have worse R sided weakness. MRI brain reviewed, confirmed worse stroke. This AM, symptoms improved with increased R sided strength -Therapy recs for CIR, plan for CIR tomorrow  Right calvarial lytic lesion:  -Showing an approximate 2.5 cm lytic lesion involving the right frontal calvarium with possible associated pathologic fracture.  Finding is felt to be nonspecific, suspected to have been present on previous CT from 2018, although only faintly visible at that time.   -discussed with Neurology, recommendation to have this lesion f/u as outpatient  Hypertensive urgency: Patient is not taking any antihypertensives at home.  Blood pressure significantly elevated on arrival with systolic in the 190s.  Systolic now down to 130s. -Plan to gradually normalize BP as tolerated  Hyperlipidemia:  -Start high intensity statin given acute stroke per above  DVT prophylaxis: Lovenox subq Code Status: DNR Family Communication: Pt in room, daughter at bedside  Status is: Inpatient  Remains inpatient appropriate because:Ongoing diagnostic testing needed not appropriate for outpatient work up   Dispo: The patient is from: Home              Anticipated d/c is to: CIR              Anticipated d/c date is: 1 day  Patient currently is medically stable to d/c. Just awaiting CIR placement   Consultants:   Neurology  Procedures:     Antimicrobials: Anti-infectives (From admission, onward)   None      Subjective: No complaints this AM  Objective: Vitals:   06/01/20 1100 06/01/20 1220 06/01/20 1400 06/01/20 1637  BP:  137/67 (!) 158/97 (!) 171/83  Pulse: 72 76 93 89  Resp: 18 19 19 19   Temp:  98.5 F (36.9 C) 98.5 F (36.9 C) 98.5 F (36.9 C)   TempSrc:  Oral Oral Oral  SpO2: 95% 94% 97% 94%  Weight:      Height:        Intake/Output Summary (Last 24 hours) at 06/01/2020 1745 Last data filed at 06/01/2020 1000 Gross per 24 hour  Intake 120 ml  Output 750 ml  Net -630 ml   Filed Weights   05/29/20 2146 05/30/20 0107  Weight: 64 kg 68.7 kg    Examination: General exam: Conversant, in no acute distress Respiratory system: normal chest rise, clear, no audible wheezing Cardiovascular system: regular rhythm, s1-s2 Gastrointestinal system: Nondistended, nontender, pos BS Central nervous system: No seizures, no tremors, R sided weakness has improved Extremities: No cyanosis, no joint deformities Skin: No rashes, no pallor Psychiatry: Affect normal // no auditory hallucinations   Data Reviewed: I have personally reviewed following labs and imaging studies  CBC: Recent Labs  Lab 05/29/20 2143 05/29/20 2150  WBC  --  7.0  NEUTROABS  --  3.0  HGB 13.3 12.6  HCT 39.0 40.8  MCV  --  83.8  PLT  --  212   Basic Metabolic Panel: Recent Labs  Lab 05/29/20 2143 05/29/20 2150  NA 140 137  K 3.5 3.5  CL 101 103  CO2  --  24  GLUCOSE 95 100*  BUN 11 5*  CREATININE 0.90 1.03*  CALCIUM  --  8.5*   GFR: Estimated Creatinine Clearance: 28.2 mL/min (A) (by C-G formula based on SCr of 1.03 mg/dL (H)). Liver Function Tests: Recent Labs  Lab 05/29/20 2150  AST 35  ALT 27  ALKPHOS 72  BILITOT 0.8  PROT 7.2  ALBUMIN 3.2*   No results for input(s): LIPASE, AMYLASE in the last 168 hours. No results for input(s): AMMONIA in the last 168 hours. Coagulation Profile: Recent Labs  Lab 05/29/20 2150  INR 1.1   Cardiac Enzymes: No results for input(s): CKTOTAL, CKMB, CKMBINDEX, TROPONINI in the last 168 hours. BNP (last 3 results) No results for input(s): PROBNP in the last 8760 hours. HbA1C: Recent Labs    05/30/20 0425  HGBA1C 6.1*   CBG: No results for input(s): GLUCAP in the last 168 hours. Lipid  Profile: Recent Labs    05/30/20 0425  CHOL 204*  HDL 40*  LDLCALC 136*  TRIG 139  CHOLHDL 5.1   Thyroid Function Tests: No results for input(s): TSH, T4TOTAL, FREET4, T3FREE, THYROIDAB in the last 72 hours. Anemia Panel: No results for input(s): VITAMINB12, FOLATE, FERRITIN, TIBC, IRON, RETICCTPCT in the last 72 hours. Sepsis Labs: No results for input(s): PROCALCITON, LATICACIDVEN in the last 168 hours.  Recent Results (from the past 240 hour(s))  SARS Coronavirus 2 by RT PCR (hospital order, performed in [facility] hospital lab) Nasopharyngeal Nasopharyngeal Swab     Status: None   Collection Time: 05/29/20 10:55 PM   Specimen: Nasopharyngeal Swab  Result Value Ref Range Status   SARS Coronavirus 2 NEGATIVE NEGATIVE Final    Comment: (NOTE)  SARS-CoV-2 target nucleic acids are NOT DETECTED.  The SARS-CoV-2 RNA is generally detectable in upper and lower respiratory specimens during the acute phase of infection. The lowest concentration of SARS-CoV-2 viral copies this assay can detect is 250 copies / mL. A negative result does not preclude SARS-CoV-2 infection and should not be used as the sole basis for treatment or other patient management decisions.  A negative result may occur with improper specimen collection / handling, submission of specimen other than nasopharyngeal swab, presence of viral mutation(s) within the areas targeted by this assay, and inadequate number of viral copies (<250 copies / mL). A negative result must be combined with clinical observations, patient history, and epidemiological information.  Fact Sheet for Patients:   BoilerBrush.com.cy  Fact Sheet for Healthcare Providers: https://pope.com/  This test is not yet approved or  cleared by the [place] FDA and has been authorized for detection and/or diagnosis of SARS-CoV-2 by FDA under an Emergency Use Authorization (EUA).  This EUA will  remain in effect (meaning this test can be used) for the duration of the COVID-19 declaration under Section 564(b)(1) of the Act, 21 U.S.C. section 360bbb-3(b)(1), unless the authorization is terminated or revoked sooner.  Performed at [facility], [address], [place], [place] [address]      Radiology Studies: MR BRAIN WO CONTRAST  Result Date: 05/31/2020 CLINICAL DATA:  Pontine infarct, follow-up EXAM: MRI HEAD WITHOUT CONTRAST TECHNIQUE: Multiplanar, multiecho pulse sequences of the brain and surrounding structures were obtained without intravenous contrast. Specifically, axial and coronal DWI and axial T2 FLAIR sequences were obtained. COMPARISON:  05/29/2020 FINDINGS: Brain: Increase in size of acute infarction involving left parasagittal pons. This now measures approximately up to 13 mm (previously 6 mm). Ventricles are stable in size. Findings of chronic microvascular ischemic changes and chronic small vessel infarct again noted. Vascular: Major vessel flow voids at the skull base are preserved. Skull and upper cervical spine: Right frontal calvarial lesion with extraosseous extension is again identified and incompletely evaluated on this study. Sinuses/Orbits: Mild paranasal sinus mucosal thickening. Orbits are unremarkable. Other: None. IMPRESSION: Increase in size of small acute infarct of the left parasagittal pons. Electronically Signed   By: Guadlupe Spanish M.D.   On: 05/31/2020 19:31   DG Swallowing Func-Speech Pathology  Result Date: 06/01/2020 Objective Swallowing Evaluation: Type of Study: MBS-Modified Barium Swallow Study  Patient Details Name: Ann Coleman MRN: [id number] Date of Birth: 04-26-1924 Today's Date: 06/01/2020 Time: SLP Start Time (ACUTE ONLY): 1100 -SLP Stop Time (ACUTE ONLY): 1125 SLP Time Calculation (min) (ACUTE ONLY): 25 min Past Medical History: Past Medical History: Diagnosis Date . High cholesterol  . Hypertension  . Stroke [facility])  Past Surgical History: Past  Surgical History: Procedure Laterality Date . NO PAST SURGERIES   HPI: Conda Ginyard is a 86 y.o. [name] speaking female with medical history significant of stroke in 2018, hypertension, hyperlipidemia.  Daughter reported pt could not get up to use the bathroom as her legs were weak.  Family also noticed that her speech was slurred.  When EMS arrived, they noticed that patient had right arm drift.  stat brain MRI was ordered and reviewed by neurology -showing an acute left pontine stroke which is felt to be the likely explanation for her right-sided weakness. Pt passed [facility] in ED, but struggled taking a pill later.  No data recorded Assessment / Plan / Recommendation CHL IP CLINICAL IMPRESSIONS 05/31/2020 Clinical Impression Pt demonstrates a primary oral dysphagia with  buccal weakness but strong lingual manipulation. With extra time she is able to gather boluses over two piecemeal efforts for propulsion to pharynx. She has instances of trace oral residue with liquids that she does gather and propel, but does not immediately swallow, which resulted in some trace sensed aspiration. Pharyngeal impairment is mostly characterized by delayed initiation with trace sensed aspiration of thin liquids before the swallow, seem with straw sips or very large sips. Pt is recommended to consume nectar thick drinks and soft ground (dys 2) solids (food from home permitted) and can also have teaspoons of thin soup. SLP will f/u, likely will be able to uprgrade to thin with further training for small sips and a dry swallow to fully clear residue.  SLP Visit Diagnosis Dysphagia, oral phase (R13.11);Dysphagia, oropharyngeal phase (R13.12) Attention and concentration deficit following -- Frontal lobe and executive function deficit following -- Impact on safety and function Mild aspiration risk   CHL IP TREATMENT RECOMMENDATION 05/31/2020 Treatment Recommendations Therapy as outlined in treatment plan below   Prognosis 05/31/2020 Prognosis for  Safe Diet Advancement Good Barriers to Reach Goals -- Barriers/Prognosis Comment -- CHL IP DIET RECOMMENDATION 05/31/2020 SLP Diet Recommendations Dysphagia 2 (Fine chop) solids;Nectar thick liquid Liquid Administration via Cup;Straw Medication Administration Whole meds with liquid Compensations Slow rate;Small sips/bites;Lingual sweep for clearance of pocketing;Multiple dry swallows after each bite/sip Postural Changes Remain semi-upright after after feeds/meals (Comment)   CHL IP OTHER RECOMMENDATIONS 05/31/2020 Recommended Consults -- Oral Care Recommendations Oral care BID Other Recommendations --   CHL IP FOLLOW UP RECOMMENDATIONS 05/31/2020 Follow up Recommendations Inpatient Rehab   CHL IP FREQUENCY AND DURATION 05/31/2020 Speech Therapy Frequency (ACUTE ONLY) min 2x/week Treatment Duration 2 weeks      CHL IP ORAL PHASE 05/31/2020 Oral Phase Impaired Oral - Pudding Teaspoon -- Oral - Pudding Cup -- Oral - Honey Teaspoon -- Oral - Honey Cup -- Oral - Nectar Teaspoon -- Oral - Nectar Cup Decreased bolus cohesion;Left pocketing in lateral sulci;Right pocketing in lateral sulci;Lingual/palatal residue;Delayed oral transit Oral - Nectar Straw Decreased bolus cohesion;Left pocketing in lateral sulci;Right pocketing in lateral sulci;Lingual/palatal residue;Delayed oral transit Oral - Thin Teaspoon -- Oral - Thin Cup Decreased bolus cohesion;Left pocketing in lateral sulci;Right pocketing in lateral sulci;Lingual/palatal residue;Delayed oral transit Oral - Thin Straw Decreased bolus cohesion;Left pocketing in lateral sulci;Right pocketing in lateral sulci;Lingual/palatal residue;Delayed oral transit Oral - Puree Decreased bolus cohesion;Left pocketing in lateral sulci;Right pocketing in lateral sulci;Lingual/palatal residue;Delayed oral transit Oral - Mech Soft Decreased bolus cohesion;Left pocketing in lateral sulci;Right pocketing in lateral sulci;Lingual/palatal residue;Delayed oral transit Oral - Regular -- Oral -  Multi-Consistency -- Oral - Pill -- Oral Phase - Comment Pt demonstrates a primary oral dysphagia with buccal weakness but strong lingual manipulation. With extra time she is albe to gather boluses over two piecemeal efforts for propulsion to pharynx. She has instances of trace oral residue with liquids that she does gather and propel, but does not immediately swallow, which resuleted in some trace sensed aspiration. Pharyngeal impairment is mostly characterized by delayed initiation with trace sensed aspriation of thin liquids before the swallow, seem with stra sips or very large sips. Pt is recommended to consume nectar thick drinks and soft ground (dys 2) solids (food from home permitted) and can also have teaspoons of thin soup. SLP will f/u, likely will be able to uprgrade to thin with further training for small sips and a dry swallow to fully clear residue.   CHL IP PHARYNGEAL PHASE  05/31/2020 Pharyngeal Phase Impaired Pharyngeal- Pudding Teaspoon -- Pharyngeal -- Pharyngeal- Pudding Cup -- Pharyngeal -- Pharyngeal- Honey Teaspoon -- Pharyngeal -- Pharyngeal- Honey Cup -- Pharyngeal -- Pharyngeal- Nectar Teaspoon -- Pharyngeal -- Pharyngeal- Nectar Cup Delayed swallow initiation-pyriform sinuses Pharyngeal -- Pharyngeal- Nectar Straw Delayed swallow initiation-pyriform sinuses Pharyngeal -- Pharyngeal- Thin Teaspoon -- Pharyngeal -- Pharyngeal- Thin Cup Delayed swallow initiation-pyriform sinuses;Penetration/Aspiration before swallow;Penetration/Apiration after swallow Pharyngeal Material enters airway, passes BELOW cords then ejected out Pharyngeal- Thin Straw Penetration/Aspiration before swallow;Delayed swallow initiation-pyriform sinuses Pharyngeal Material enters airway, passes BELOW cords then ejected out Pharyngeal- Puree Delayed swallow initiation-vallecula Pharyngeal -- Pharyngeal- Mechanical Soft Delayed swallow initiation-vallecula Pharyngeal -- Pharyngeal- Regular -- Pharyngeal -- Pharyngeal-  Multi-consistency -- Pharyngeal -- Pharyngeal- Pill -- Pharyngeal -- Pharyngeal Comment --  No flowsheet data found. DeBlois, Riley NearingBonnie Caroline 06/01/2020, 10:49 AM               Scheduled Meds: . aspirin EC  81 mg Oral Daily  . atorvastatin  40 mg Oral Daily  . clopidogrel  75 mg Oral Daily  . enoxaparin (LOVENOX) injection  30 mg Subcutaneous Q24H   Continuous Infusions:   LOS: 3 days   Rickey BarbaraStephen Gorman Safi, MD [facility] Pager On Amion  If 7PM-7AM, please contact night-coverage 06/01/2020, 5:45 PM

## 2020-06-01 NOTE — Progress Notes (Signed)
Physical Therapy Treatment Patient Details Name: Ann Coleman MRN: [id number] DOB: [birth year] Today's Date: 06/01/2020    History of Present Illness Pt is a 86 y.o. [place]-speaking female with PMH of CVA, HTN, mixed HLD presented to ED with weakness in legs, slurred speech, and R arm drift. MRI reveals subtle approximate 6 mm acute ischemic nonhemorrhagic left pontine infarct and 2.8 cm mass involving the right frontal calvarium with extraosseous extension that has increased in size since 2018. Pt developed new R hemiplegia overnight 7/1. New MRI revealed Increase in size of small acute infarct of the left parasagittal pons.    PT Comments    New MRI 7/1 revealed progression of L pons CVA resulting in R hemiplegia. RUE 2/5 strength elbow/shoulder and 0/5 grip. RLE 2/5 knee/hip and 0/5 ankle. She required min assist rolling R, mod assist rolling L, max assist supine <> sit, and +2 max assist sit <> stand in stedy. Dependent OOB transfer using stedy. Pt cooperative and engaged in session. Current POC remains appropriate.   Follow Up Recommendations  CIR;Supervision/Assistance - 24 hour     Equipment Recommendations  None recommended by PT (defer to next venue)    Recommendations for Other Services       Precautions / Restrictions Precautions Precautions: Fall    Mobility  Bed Mobility Overal bed mobility: Needs Assistance Bed Mobility: Rolling;Supine to Sit;Sit to Supine Rolling: Min assist;Mod assist   Supine to sit: Max assist;HOB elevated Sit to supine: Max assist   General bed mobility comments: Cues for sequencing, assist with BLE and trunk, increased time. Rolling R min assist. Rolling L mod assist.  Transfers Overall transfer level: Needs assistance   Transfers: Sit to/from Stand Sit to Stand: +2 physical assistance;Max assist         General transfer comment: use of bed pads to lift hips. Pt able to assist by pulling up with LUE. Good effort noted. Dependent transfer  OOB with stedy.  Ambulation/Gait             General Gait Details: unable   Stairs             Wheelchair Mobility    Modified Rankin (Stroke Patients Only) Modified Rankin (Stroke Patients Only) Pre-Morbid Rankin Score: No symptoms Modified Rankin: Severe disability     Balance Overall balance assessment: Needs assistance Sitting-balance support: Single extremity supported;Feet supported Sitting balance-Leahy Scale: Poor Sitting balance - Comments: mod assist to maintain balance EOB Postural control: Posterior lean                                  Cognition Arousal/Alertness: Awake/alert Behavior During Therapy: WFL for tasks assessed/performed;Flat affect Overall Cognitive Status: Difficult to assess                                 General Comments: following simple commands with interpreter      Exercises      General Comments General comments (skin integrity, edema, etc.): Family present to provide interpreting.      Pertinent Vitals/Pain Pain Assessment: Faces Faces Pain Scale: Hurts a little bit Pain Location: generalized with mobility Pain Descriptors / Indicators: Grimacing Pain Intervention(s): Monitored during session;Repositioned    Home Living                      Prior Function  PT Goals (current goals can now be found in the care plan section) Acute Rehab PT Goals Patient Stated Goal: none Progress towards PT goals: Progressing toward goals    Frequency    Min 4X/week      PT Plan Current plan remains appropriate    Co-evaluation              AM-PAC PT "6 Clicks" Mobility   Outcome Measure  Help needed turning from your back to your side while in a flat bed without using bedrails?: A Little Help needed moving from lying on your back to sitting on the side of a flat bed without using bedrails?: A Lot Help needed moving to and from a bed to a chair (including a  wheelchair)?: A Lot Help needed standing up from a chair using your arms (e.g., wheelchair or bedside chair)?: A Lot Help needed to walk in hospital room?: Total Help needed climbing 3-5 steps with a railing? : Total 6 Click Score: 11    End of Session Equipment Utilized During Treatment: Gait belt Activity Tolerance: Patient tolerated treatment well Patient left: in bed;with call bell/phone within reach;with bed alarm set;with family/visitor present;with nursing/sitter in room Nurse Communication: Mobility status PT Visit Diagnosis: Other symptoms and signs involving the nervous system (E32.122)     Time: 4825-0037 PT Time Calculation (min) (ACUTE ONLY): 25 min  Charges:  $Therapeutic Activity: 23-37 mins                     Aida Raider, PT  Office # [phone] Pager [phone]    Ilda Foil 06/01/2020, 11:16 AM

## 2020-06-01 NOTE — Plan of Care (Signed)
  Problem: Nutrition: Goal: Dietary intake will improve Outcome: Progressing   Problem: Education: Goal: Knowledge of General Education information will improve Description: Including pain rating scale, medication(s)/side effects and non-pharmacologic comfort measures Outcome: Progressing   

## 2020-06-01 NOTE — Progress Notes (Signed)
STROKE TEAM PROGRESS NOTE   INTERVAL HISTORY Daughter and husband at bedside. Pt lying in bed, still has right hemiplegia. Repeat MRI confirmed extension of left pontine infarct. PT/OT recommend CIR.   Vitals:   06/01/20 0740 06/01/20 0910 06/01/20 1100 06/01/20 1220  BP:  (!) 153/70  137/67  Pulse: (!) 59 82 72 76  Resp: 18 (!) 23 18 19   Temp:  98.3 F (36.8 C)  98.5 F (36.9 C)  TempSrc:  Oral  Oral  SpO2: 94% 92% 95% 94%  Weight:      Height:        CBC:  Recent Labs  Lab 05/29/20 2143 05/29/20 2150  WBC  --  7.0  NEUTROABS  --  3.0  HGB 13.3 12.6  HCT 39.0 40.8  MCV  --  83.8  PLT  --  212    Basic Metabolic Panel:  Recent Labs  Lab 05/29/20 2143 05/29/20 2150  NA 140 137  K 3.5 3.5  CL 101 103  CO2  --  24  GLUCOSE 95 100*  BUN 11 5*  CREATININE 0.90 1.03*  CALCIUM  --  8.5*   Lipid Panel:     Component Value Date/Time   CHOL 204 (H) 05/30/2020 0425   TRIG 139 05/30/2020 0425   HDL 40 (L) 05/30/2020 0425   CHOLHDL 5.1 05/30/2020 0425   VLDL 28 05/30/2020 0425   LDLCALC 136 (H) 05/30/2020 0425   HgbA1c:  Lab Results  Component Value Date   HGBA1C 6.1 (H) 05/30/2020   Urine Drug Screen: No results found for: LABOPIA, COCAINSCRNUR, LABBENZ, AMPHETMU, THCU, LABBARB  Alcohol Level No results found for: ETH  IMAGING past 24 hours MR BRAIN WO CONTRAST  Result Date: 05/31/2020 CLINICAL DATA:  Pontine infarct, follow-up EXAM: MRI HEAD WITHOUT CONTRAST TECHNIQUE: Multiplanar, multiecho pulse sequences of the brain and surrounding structures were obtained without intravenous contrast. Specifically, axial and coronal DWI and axial T2 FLAIR sequences were obtained. COMPARISON:  05/29/2020 FINDINGS: Brain: Increase in size of acute infarction involving left parasagittal pons. This now measures approximately up to 13 mm (previously 6 mm). Ventricles are stable in size. Findings of chronic microvascular ischemic changes and chronic small vessel infarct again  noted. Vascular: Major vessel flow voids at the skull base are preserved. Skull and upper cervical spine: Right frontal calvarial lesion with extraosseous extension is again identified and incompletely evaluated on this study. Sinuses/Orbits: Mild paranasal sinus mucosal thickening. Orbits are unremarkable. Other: None. IMPRESSION: Increase in size of small acute infarct of the left parasagittal pons. Electronically Signed   By: 05/31/2020 M.D.   On: 05/31/2020 19:31    PHYSICAL EXAM    Temp:  [97.8 F (36.6 C)-98.5 F (36.9 C)] 98.5 F (36.9 C) (07/02 1220) Pulse Rate:  [59-82] 76 (07/02 1220) Resp:  [16-23] 19 (07/02 1220) BP: (137-160)/(61-82) 137/67 (07/02 1220) SpO2:  [92 %-96 %] 94 % (07/02 1220)  General - Well nourished, well developed, in no apparent distress.  Ophthalmologic - fundi not visualized due to noncooperation.  Cardiovascular - Regular rhythm and rate.  Neuro - Awake alert, orientated to place and people, DOB, but not to time or age. Moderate dysarthria but no aphasia, not cooperative on repetition but able to name 4/4. No gaze palsy, attending to both sides. Visual field full. PERRL. Right nasolabial fold mild flattening. RUE 0/5. RLE 1/5 with pain stimulation. LUE 5/5, LLE proximal and knee extension 3/5, distal PF/DF 4/5. No babinski. Sensation symmetrical,  FTN intact grossly, gait not tested.    ASSESSMENT/PLAN [name] is a 86 y.o. female with history of HTN, HLD, Stroke in 2018 w/ residual LLE weakness presenting with R arm weakness, difficulty walking and slurred speech. BP elevated 180-190s. Out of window for tPA. No LVO.  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 Small vessel disease. Atrophy. Multiple old B basal ganglia lacunes. ASPECTS 10.     MRI  L pontine infarct. Small vessel disease. Atrophy. Old B basal ganglia and R thalamic lacunes.    MRI repeat increased size small L parasagittal pontine infarct    MRA  Motion. No LVO. Mid BA moderate stenosis. Scattered intracranial atherosclerosis.   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 BA 30% stenosis  2D Echo EF > 75%  LDL 136  HgbA1c 6.1  Lovenox 30 mg sq daily for VTE prophylaxis  No antithrombotics prior to admission, now on aspirin 81 mg daily and clopidogrel 75 mg daily. Continue DAPT for 3 weeks and then ASA alone.  Therapy recommendations:  CIR  Disposition:  pending   Hx stroke/TIA  03/2017 - LLE weakness. MRI R PLIC infarct secondary to small vessel disease source. MRA and CUS neg. EF 60-65%. LDL 110 and A1C 5.6. Asa 325 and zocor 10. HH therapies.  Followed with Dr. Lucia Gaskins at [facility]  R calvarial lytic lesion  CT head - R frontal calvarium lytic lesion, present 2018 but less so.   MRI - 2.8cm R frontal calvaium mass w/ extraosseous extension seen in 2018 but less so.   CTA head and neck - Right frontal calvarial lytic mass as described by MRI. Differential diagnosis is primarily metastatic disease versus myeloma versus intra osseous meningioma versus enlarging hemangioma.  Recommend outpt neurosurgery referral  Hypertensive Urgency  SBP 180-190s on arrival  Improving . Gradually normalize in 2-3 days . Long-term BP goal normotensive  Hyperlipidemia  Home meds:  none  Now on liptitor 40  LDL 136, goal < 70  Continue statin at discharge  Dysphagia . Secondary to stroke . dys 1 and nectar thick ->MBS today -> dys 2 and nectar thick . Speech on board   Other Stroke Risk Factors  Advanced age  Obesity, Body mass index is 30.59 kg/m., recommend weight loss, diet and exercise as appropriate   Other Active Problems  Medication noncompliance  Loss of follow up  Hospital day # 3  I had long discussion with daughter at bedside, updated pt current condition, treatment plan and potential prognosis, and  answered all the questions. She expressed understanding and appreciation.   Marvel Plan, MD PhD Stroke Neurology 06/01/2020 12:57 PM   To contact Stroke Continuity provider, please refer to [email or web address]. After hours, contact General Neurology

## 2020-06-01 NOTE — Progress Notes (Signed)
SLP Cancellation Note  Patient Details Name: Ann Coleman MRN: [id number] DOB: [birth year]   Cancelled treatment:       Reason Eval/Treat Not Completed: Patient at procedure or test/unavailable. Pt with other providers. Appears to be going to CIR today. Defer f/u to next level of care   Ann Coleman, [name] 06/01/2020, 10:47 AM

## 2020-06-02 ENCOUNTER — Inpatient Hospital Stay (HOSPITAL_COMMUNITY)
Admission: RE | Admit: 2020-06-02 | Discharge: 2020-06-22 | DRG: 057 | Disposition: A | Discharge status: Home Health | Payer: Medicare Other | Source: Intra-hospital | Attending: Physical Medicine & Rehabilitation | Admitting: Physical Medicine & Rehabilitation

## 2020-06-02 ENCOUNTER — Encounter (HOSPITAL_COMMUNITY): Payer: Self-pay | Admitting: Physical Medicine & Rehabilitation

## 2020-06-02 DIAGNOSIS — I639 Cerebral infarction, unspecified: Secondary | ICD-10-CM | POA: Diagnosis present

## 2020-06-02 DIAGNOSIS — Z7982 Long term (current) use of aspirin: Secondary | ICD-10-CM | POA: Diagnosis not present

## 2020-06-02 DIAGNOSIS — Z79899 Other long term (current) drug therapy: Secondary | ICD-10-CM | POA: Diagnosis not present

## 2020-06-02 DIAGNOSIS — I69354 Hemiplegia and hemiparesis following cerebral infarction affecting left non-dominant side: Principal | ICD-10-CM

## 2020-06-02 DIAGNOSIS — M25562 Pain in left knee: Secondary | ICD-10-CM | POA: Diagnosis present

## 2020-06-02 DIAGNOSIS — I69328 Other speech and language deficits following cerebral infarction: Secondary | ICD-10-CM

## 2020-06-02 DIAGNOSIS — R0600 Dyspnea, unspecified: Secondary | ICD-10-CM

## 2020-06-02 DIAGNOSIS — I10 Essential (primary) hypertension: Secondary | ICD-10-CM | POA: Diagnosis present

## 2020-06-02 DIAGNOSIS — G8929 Other chronic pain: Secondary | ICD-10-CM | POA: Diagnosis present

## 2020-06-02 DIAGNOSIS — I6389 Other cerebral infarction: Secondary | ICD-10-CM | POA: Diagnosis not present

## 2020-06-02 DIAGNOSIS — N182 Chronic kidney disease, stage 2 (mild): Secondary | ICD-10-CM

## 2020-06-02 DIAGNOSIS — N179 Acute kidney failure, unspecified: Secondary | ICD-10-CM | POA: Diagnosis not present

## 2020-06-02 DIAGNOSIS — I129 Hypertensive chronic kidney disease with stage 1 through stage 4 chronic kidney disease, or unspecified chronic kidney disease: Secondary | ICD-10-CM | POA: Diagnosis present

## 2020-06-02 DIAGNOSIS — R0989 Other specified symptoms and signs involving the circulatory and respiratory systems: Secondary | ICD-10-CM

## 2020-06-02 DIAGNOSIS — R030 Elevated blood-pressure reading, without diagnosis of hypertension: Secondary | ICD-10-CM

## 2020-06-02 DIAGNOSIS — M25561 Pain in right knee: Secondary | ICD-10-CM | POA: Diagnosis present

## 2020-06-02 DIAGNOSIS — I69391 Dysphagia following cerebral infarction: Secondary | ICD-10-CM | POA: Diagnosis not present

## 2020-06-02 DIAGNOSIS — E785 Hyperlipidemia, unspecified: Secondary | ICD-10-CM | POA: Diagnosis present

## 2020-06-02 DIAGNOSIS — R062 Wheezing: Secondary | ICD-10-CM | POA: Diagnosis present

## 2020-06-02 DIAGNOSIS — I6350 Cerebral infarction due to unspecified occlusion or stenosis of unspecified cerebral artery: Secondary | ICD-10-CM | POA: Diagnosis not present

## 2020-06-02 DIAGNOSIS — R4781 Slurred speech: Secondary | ICD-10-CM | POA: Diagnosis present

## 2020-06-02 DIAGNOSIS — I700 Atherosclerosis of aorta: Secondary | ICD-10-CM | POA: Diagnosis present

## 2020-06-02 DIAGNOSIS — R1311 Dysphagia, oral phase: Secondary | ICD-10-CM | POA: Diagnosis not present

## 2020-06-02 DIAGNOSIS — I5189 Other ill-defined heart diseases: Secondary | ICD-10-CM | POA: Diagnosis not present

## 2020-06-02 DIAGNOSIS — R7303 Prediabetes: Secondary | ICD-10-CM

## 2020-06-02 DIAGNOSIS — I69322 Dysarthria following cerebral infarction: Secondary | ICD-10-CM

## 2020-06-02 DIAGNOSIS — G8191 Hemiplegia, unspecified affecting right dominant side: Secondary | ICD-10-CM

## 2020-06-02 DIAGNOSIS — R0789 Other chest pain: Secondary | ICD-10-CM | POA: Diagnosis not present

## 2020-06-02 LAB — CBC
HCT: 41.6 % (ref 36.0–46.0)
Hemoglobin: 13 g/dL (ref 12.0–15.0)
MCH: 26 pg (ref 26.0–34.0)
MCHC: 31.3 g/dL (ref 30.0–36.0)
MCV: 83.2 fL (ref 80.0–100.0)
Platelets: 230 10*3/uL (ref 150–400)
RBC: 5 MIL/uL (ref 3.87–5.11)
RDW: 13.5 % (ref 11.5–15.5)
WBC: 5.9 10*3/uL (ref 4.0–10.5)
nRBC: 0 % (ref 0.0–0.2)

## 2020-06-02 LAB — CREATININE, SERUM
Creatinine, Ser: 1.03 mg/dL — ABNORMAL HIGH (ref 0.44–1.00)
GFR calc Af Amer: 54 mL/min — ABNORMAL LOW (ref >60)
GFR calc non Af Amer: 46 mL/min — ABNORMAL LOW (ref >60)

## 2020-06-02 MED ORDER — ENOXAPARIN SODIUM 30 MG/0.3ML ~~LOC~~ SOLN
30.0000 mg | SUBCUTANEOUS | Status: DC
  Administered 2020-06-03 – 2020-06-22 (×20): 30 mg via SUBCUTANEOUS
  Filled 2020-06-02 (×20): qty 0.3

## 2020-06-02 MED ORDER — ATORVASTATIN CALCIUM 40 MG PO TABS
40.0000 mg | ORAL_TABLET | Freq: Every day | ORAL | Status: DC
  Administered 2020-06-03 – 2020-06-22 (×20): 40 mg via ORAL
  Filled 2020-06-02 (×20): qty 1

## 2020-06-02 MED ORDER — CLOPIDOGREL BISULFATE 75 MG PO TABS: 75.0000 mg | ORAL_TABLET | Freq: Every day | ORAL | Status: DC

## 2020-06-02 MED ORDER — ACETAMINOPHEN 325 MG PO TABS
650.0000 mg | ORAL_TABLET | ORAL | Status: DC | PRN
  Administered 2020-06-02 – 2020-06-11 (×8): 650 mg via ORAL
  Filled 2020-06-02 (×8): qty 2

## 2020-06-02 MED ORDER — ASPIRIN EC 81 MG PO TBEC
81.0000 mg | DELAYED_RELEASE_TABLET | Freq: Every day | ORAL | Status: DC
  Administered 2020-06-03 – 2020-06-22 (×20): 81 mg via ORAL
  Filled 2020-06-02 (×20): qty 1

## 2020-06-02 MED ORDER — ENOXAPARIN SODIUM 30 MG/0.3ML ~~LOC~~ SOLN: 30.0000 mg | Freq: Two times a day (BID) | SUBCUTANEOUS | Status: DC

## 2020-06-02 MED ORDER — SORBITOL 70 % SOLN: 30.0000 mL | Freq: Every day | Status: DC | PRN

## 2020-06-02 MED ORDER — ACETAMINOPHEN 160 MG/5ML PO SOLN: 650.0000 mg | ORAL | Status: DC | PRN

## 2020-06-02 MED ORDER — ACETAMINOPHEN 650 MG RE SUPP: 650.0000 mg | RECTAL | Status: DC | PRN

## 2020-06-02 MED ORDER — CLOPIDOGREL BISULFATE 75 MG PO TABS
75.0000 mg | ORAL_TABLET | Freq: Every day | ORAL | Status: AC
  Administered 2020-06-03 – 2020-06-20 (×18): 75 mg via ORAL
  Filled 2020-06-02 (×18): qty 1

## 2020-06-02 MED ORDER — ATORVASTATIN CALCIUM 40 MG PO TABS: 40.0000 mg | ORAL_TABLET | Freq: Every day | ORAL | Status: DC

## 2020-06-02 MED ORDER — RESOURCE THICKENUP CLEAR PO POWD
ORAL | Status: DC | PRN
  Filled 2020-06-02 (×2): qty 125

## 2020-06-02 MED ORDER — ASPIRIN 81 MG PO TBEC: 81.0000 mg | DELAYED_RELEASE_TABLET | Freq: Every day | ORAL | 11 refills | Status: DC

## 2020-06-02 NOTE — Progress Notes (Signed)
Ann Chin, MD  Physician  Physical Medicine and Rehabilitation  Consult Note      Signed  Date of Service:  05/31/2020  4:59 AM      Related encounter: ED to Hosp-Admission (Discharged) from 05/29/2020 in [place] [facility]      Signed      Expand All Collapse All  Show:Clear all Manual[x] Template[] Copied  Added by: Angiulli, Mcarthur Rossetti, PA-C[x] Raulkar, Drema Pry, MD  Hover for details          Physical Medicine and Rehabilitation Consult Reason for Consult: Right side weakness with slurred speech Referring Physician: [facility]     HPI: Ann Coleman is a 86 y.o. right-handed non-English-speaking [name] female with history of hypertension and hyperlipidemia as well as CVA 3 years ago.  Per chart review patient lives with her children.  1 level home 2 steps to entry.  Reportedly independent prior to admission using a walker and assistance from her daughter.  Presented 05/29/2020 with left side weakness and slurred speech.  Noted systolic blood pressure in the 190s.  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  In retrospect this lesion appeared to have been present on her prior MRI from 2018.  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  This did appear chronic is again noted on MRI 2018.  Echocardiogram with ejection fraction of 75% no regional wall motion abnormalities.  Admission chemistries BUN 5 creatinine 1.03, SARS coronavirus negative.  Dysphagia #1 nectar thick liquids.  Therapy evaluation completed with recommendations of physical medicine rehab consult.  Currently maintained on aspirin and Plavix for CVA prophylaxis x3 weeks then aspirin alone.  Subcutaneous Lovenox for DVT prophylaxis.     Review of  Systems  Unable to perform ROS: Language        Past Medical History:  Diagnosis Date   High cholesterol     Hypertension     Stroke [facility])           Past Surgical History:  Procedure Laterality Date   NO PAST SURGERIES             Family History  Problem Relation Age of Onset   Stroke Neg Hx     Diabetes Mellitus II Neg Hx     Hypertension Neg Hx      Social History:  reports that she has never smoked. She has never used smokeless tobacco. She reports that she does not drink alcohol and does not use drugs. Allergies: No Known Allergies       Medications Prior to Admission  Medication Sig Dispense Refill   acetaminophen (TYLENOL) 500 MG tablet Take 500 mg by mouth every 6 (six) hours as needed for moderate pain.       amLODipine (NORVASC) 5 MG tablet Take 1 tablet (5 mg total) by mouth daily. (Patient not taking: Reported on 05/29/2020) 30 tablet 2   aspirin 325 MG tablet Take 1 tablet (325 mg total) by mouth daily. (Patient not taking: Reported on 05/29/2020)       hydrochlorothiazide (MICROZIDE) 12.5 MG capsule Take 1 capsule (12.5 mg total) by mouth daily. (Patient not taking: Reported on 05/29/2020) 30 capsule 0   simvastatin (ZOCOR) 10 MG tablet Take 1 tablet (10 mg total) by mouth daily at  6 PM. (Patient not taking: Reported on 05/29/2020) 30 tablet 0      Home: Home Living Family/patient expects to be discharged to:: Private residence Living Arrangements: Children Type of Home: House Home Access: Stairs to enter Secretary/administrator of Steps: 2 Entrance Stairs-Rails: None Home Layout: One level Bathroom Shower/Tub: Engineer, manufacturing systems: Standard Home Equipment: Environmental consultant - 4 wheels, Hand held shower head Additional Comments: limited in questions due to communication barriers. Some above information was taken from prior admission but pt unable to confirm if it is still correct.   Lives With: Daughter  Functional History: Prior  Function Comments: Limited information due to communication barrier. Pt reports she did not need DME to ambulate and lived with daughter Functional Status:  Mobility: Bed Mobility Overal bed mobility: Needs Assistance Bed Mobility: Rolling, Supine to Sit, Sit to Supine Rolling: Mod assist, +2 for physical assistance, +2 for safety/equipment Supine to sit: Mod assist, +2 for physical assistance, +2 for safety/equipment Sit to supine: Mod assist, +2 for physical assistance, +2 for safety/equipment General bed mobility comments: Mod A+2 for bed mobility due to language barrier and for pt safety Transfers General transfer comment: deferred due to safety   ADL: ADL Overall ADL's : Needs assistance/impaired Eating/Feeding: NPO Grooming: Set up, Sitting, Min guard Grooming Details (indicate cue type and reason): pt able to bring hand to face in sitting with min guard for balance Upper Body Bathing: Moderate assistance, Sitting, Minimal assistance Upper Body Bathing Details (indicate cue type and reason): Min-Mod A with sitting balance Lower Body Bathing: Total assistance, +2 for physical assistance, +2 for safety/equipment, Sitting/lateral leans Lower Body Bathing Details (indicate cue type and reason): Pt refusing and saying she could not do LB ADLs Upper Body Dressing : Minimal assistance, Moderate assistance, Sitting Upper Body Dressing Details (indicate cue type and reason): Min-Mod A for sitting balance Lower Body Dressing: Total assistance, +2 for physical assistance, +2 for safety/equipment, Sit to/from stand Lower Body Dressing Details (indicate cue type and reason): Pt stating she could not do LB ADLs Toilet Transfer Details (indicate cue type and reason): deferred due to safety need to futher assess Toileting - Clothing Manipulation Details (indicate cue type and reason): deferred due to safety need to futher assess Tub/Shower Transfer Details (indicate cue type and reason): deferred  due to safety need to futher assess Functional mobility during ADLs: +2 for physical assistance, +2 for safety/equipment, Moderate assistance General ADL Comments: Mod A +2 for bed mobility. Limited eval due to language barrier and pt participation   Cognition: Cognition Overall Cognitive Status: No family/caregiver present to determine baseline cognitive functioning Arousal/Alertness: Awake/alert Orientation Level: Oriented to person, Oriented to place, Disoriented to situation Attention: Focused, Sustained Focused Attention: Appears intact Sustained Attention: Impaired Sustained Attention Impairment: Verbal basic, Functional basic Awareness: Impaired Awareness Impairment: Emergent impairment Problem Solving: Impaired Problem Solving Impairment: Verbal basic, Functional basic Cognition Arousal/Alertness: Awake/alert Behavior During Therapy: Flat affect Overall Cognitive Status: No family/caregiver present to determine baseline cognitive functioning General Comments: Pt inconsistent with responding to questions and following commands   Blood pressure (!) 181/85, pulse 61, temperature 98.7 F (37.1 C), temperature source Oral, resp. rate 18, height 4\' 11"  (1.499 m), weight 68.7 kg, SpO2 98 %.    Physical Exam General: Alert and oriented x 3, No apparent distress HEENT: Head is normocephalic, atraumatic, PERRLA, EOMI, sclera anicteric, oral mucosa pink and moist, dentition intact, ext ear canals clear,  Neck: Supple without JVD or lymphadenopathy Heart: Reg rate  and rhythm. No murmurs rubs or gallops Chest: CTA bilaterally without wheezes, rales, or rhonchi; no distress Abdomen: Soft, non-tender, non-distended, bowel sounds positive. Extremities: No clubbing, cyanosis, or edema. Pulses are 2+ Skin: Clean and intact without signs of breakdown Neuro: Patient is awake and alert.  Makes eye contact with examiner.  Examination limited due to language barrier.  She did follow simple  demonstrated commands. Right side flaccid. Left upper extremity is 4/5. LLE 3/5.  Psych: Pt's affect is flat. Pt is cooperative   Lab Results Last 24 Hours       Results for orders placed or performed during the hospital encounter of 05/29/20 (from the past 24 hour(s))  I-stat chem 8, ed     Status: Abnormal    Collection Time: 05/29/20  9:43 PM  Result Value Ref Range    Sodium 140 135 - 145 mmol/L    Potassium 3.5 3.5 - 5.1 mmol/L    Chloride 101 98 - 111 mmol/L    BUN 11 8 - 23 mg/dL    Creatinine, Ser 6.20 0.44 - 1.00 mg/dL    Glucose, Bld 95 70 - 99 mg/dL    Calcium, Ion 3.55 (L) 1.15 - 1.40 mmol/L    TCO2 26 22 - 32 mmol/L    Hemoglobin 13.3 12.0 - 15.0 g/dL    HCT 97.4 36 - 46 %  Protime-INR     Status: None    Collection Time: 05/29/20  9:50 PM  Result Value Ref Range    Prothrombin Time 13.7 11.4 - 15.2 seconds    INR 1.1 0.8 - 1.2  APTT     Status: Abnormal    Collection Time: 05/29/20  9:50 PM  Result Value Ref Range    aPTT 46 (H) 24 - 36 seconds  CBC     Status: Abnormal    Collection Time: 05/29/20  9:50 PM  Result Value Ref Range    WBC 7.0 4.0 - 10.5 K/uL    RBC 4.87 3.87 - 5.11 MIL/uL    Hemoglobin 12.6 12.0 - 15.0 g/dL    HCT 16.3 36 - 46 %    MCV 83.8 80.0 - 100.0 fL    MCH 25.9 (L) 26.0 - 34.0 pg    MCHC 30.9 30.0 - 36.0 g/dL    RDW 84.5 36.4 - 68.0 %    Platelets 212 150 - 400 K/uL    nRBC 0.0 0.0 - 0.2 %  Differential     Status: Abnormal    Collection Time: 05/29/20  9:50 PM  Result Value Ref Range    Neutrophils Relative % 43 %    Neutro Abs 3.0 1.7 - 7.7 K/uL    Lymphocytes Relative 38 %    Lymphs Abs 2.6 0.7 - 4.0 K/uL    Monocytes Relative 16 %    Monocytes Absolute 1.1 (H) 0 - 1 K/uL    Eosinophils Relative 2 %    Eosinophils Absolute 0.1 0 - 0 K/uL    Basophils Relative 1 %    Basophils Absolute 0.1 0 - 0 K/uL    Immature Granulocytes 0 %    Abs Immature Granulocytes 0.02 0.00 - 0.07 K/uL  Comprehensive metabolic panel     Status:  Abnormal    Collection Time: 05/29/20  9:50 PM  Result Value Ref Range    Sodium 137 135 - 145 mmol/L    Potassium 3.5 3.5 - 5.1 mmol/L    Chloride 103 98 - 111 mmol/L  CO2 24 22 - 32 mmol/L    Glucose, Bld 100 (H) 70 - 99 mg/dL    BUN 5 (L) 8 - 23 mg/dL    Creatinine, Ser 5.09 (H) 0.44 - 1.00 mg/dL    Calcium 8.5 (L) 8.9 - 10.3 mg/dL    Total Protein 7.2 6.5 - 8.1 g/dL    Albumin 3.2 (L) 3.5 - 5.0 g/dL    AST 35 15 - 41 U/L    ALT 27 0 - 44 U/L    Alkaline Phosphatase 72 38 - 126 U/L    Total Bilirubin 0.8 0.3 - 1.2 mg/dL    GFR calc non Af Amer 46 (L) >60 mL/min    GFR calc Af Amer 54 (L) >60 mL/min    Anion gap 10 5 - 15  SARS Coronavirus 2 by RT PCR (hospital order, performed in [facility] hospital lab) Nasopharyngeal Nasopharyngeal Swab     Status: None    Collection Time: 05/29/20 10:55 PM    Specimen: Nasopharyngeal Swab  Result Value Ref Range    SARS Coronavirus 2 NEGATIVE NEGATIVE  Lipid panel     Status: Abnormal    Collection Time: 05/30/20  4:25 AM  Result Value Ref Range    Cholesterol 204 (H) 0 - 200 mg/dL    Triglycerides 326 <712 mg/dL    HDL 40 (L) >45 mg/dL    Total CHOL/HDL Ratio 5.1 RATIO    VLDL 28 0 - 40 mg/dL    LDL Cholesterol 809 (H) 0 - 99 mg/dL       Imaging Results (Last 48 hours)  CT ANGIO HEAD W OR WO CONTRAST   Addendum Date: 05/30/2020   ADDENDUM REPORT: 05/30/2020 13:23 ADDENDUM: Enlarged heterogeneous thyroid gland. Largest single nodule 2 cm. recommend thyroid ultrasound (ref: J Am Coll Radiol. 2015 Feb;12(2): 143-50). Electronically Signed   By: Paulina Fusi M.D.   On: 05/30/2020 13:23    Result Date: 05/30/2020 CLINICAL DATA:  Acute presentation with slurred speech and leg weakness. Small left pontine infarction suspected by MRI. EXAM: CT ANGIOGRAPHY HEAD AND NECK TECHNIQUE: Multidetector CT imaging of the head and neck was performed using the standard protocol during bolus administration of intravenous contrast. Multiplanar CT image  reconstructions and MIPs were obtained to evaluate the vascular anatomy. Carotid stenosis measurements (when applicable) are obtained utilizing NASCET criteria, using the distal internal carotid diameter as the denominator. CONTRAST:  32mL OMNIPAQUE IOHEXOL 350 MG/ML SOLN COMPARISON:  MR studies done yesterday. FINDINGS: CTA NECK FINDINGS Aortic arch: Aortic atherosclerosis. No aneurysm or brachiocephalic vessel origin stenosis. 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 Right carotid system: Common carotid artery is widely patent to the bifurcation region. Atherosclerotic plaque at carotid bifurcation and ICA bulb but without stenosis compared to the more distal cervical ICA diameter. Left carotid system: Common carotid artery widely patent to the bifurcation. Atherosclerotic plaque at the carotid bifurcation and ICA bulb but without stenosis compared to the more distal cervical ICA diameter. Vertebral arteries: Both vertebral artery origins are widely patent. Both vertebral arteries are widely patent through the cervical region to the foramen magnum. Skeleton: Ordinary cervical spondylosis. Right frontal calvarial mass as described at previous MRI. Other neck: No lymphadenopathy. Enlarged heterogeneous thyroid gland with multiple nodules, the largest measuring 2.1 cm. Upper chest: Negative Review of the MIP images confirms the above findings CTA HEAD FINDINGS Anterior circulation: Both internal carotid arteries are patent through the skull base and  siphon regions. Ordinary siphon atherosclerotic calcification but without stenosis greater than 30%. The anterior and middle cerebral vessels are patent without correctable proximal stenosis. The anterior temporal branch of the MCA on the left appears to be occluded. Posterior circulation: Both vertebral arteries are widely patent to the basilar. Mild atherosclerotic irregularity of the basilar artery with distal basilar narrowing  of about 30%. Superior cerebellar and posterior cerebral arteries are patent. Distal PCA branch vessels show atherosclerotic irregularity. Venous sinuses: Patent and normal. Anatomic variants: None other significant. Review of the MIP images confirms the above findings IMPRESSION: Aortic atherosclerosis. 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 Atherosclerotic change at both carotid bifurcations but no stenosis. Atherosclerotic change of the carotid siphon regions but without stenosis greater than 30%. 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 This is probably chronic, similar to the MR angiography of 2018. Distal vessel atherosclerotic narrowing and irregularity of the MCA and PCA branches. Atherosclerotic irregularity of the basilar artery, maximal stenosis in the distal basilar region of approximately 30%. Right frontal calvarial lytic mass as described by MRI. Differential diagnosis is primarily metastatic disease versus myeloma versus intra osseous meningioma versus enlarging hemangioma. Electronically Signed: By: Paulina Fusi M.D. On: 05/30/2020 12:02    CT ANGIO NECK W OR WO CONTRAST   Addendum Date: 05/30/2020   ADDENDUM REPORT: 05/30/2020 13:23 ADDENDUM: Enlarged heterogeneous thyroid gland. Largest single nodule 2 cm. recommend thyroid ultrasound (ref: J Am Coll Radiol. 2015 Feb;12(2): 143-50). Electronically Signed   By: Paulina Fusi M.D.   On: 05/30/2020 13:23    Result Date: 05/30/2020 CLINICAL DATA:  Acute presentation with slurred speech and leg weakness. Small left pontine infarction suspected by MRI. EXAM: CT ANGIOGRAPHY HEAD AND NECK TECHNIQUE: Multidetector CT imaging of the head and neck was performed using the standard protocol during bolus administration of intravenous contrast. Multiplanar CT image reconstructions and MIPs were obtained to evaluate the vascular anatomy. Carotid stenosis measurements  (when applicable) are obtained utilizing NASCET criteria, using the distal internal carotid diameter as the denominator. CONTRAST:  75mL OMNIPAQUE IOHEXOL 350 MG/ML SOLN COMPARISON:  MR studies done yesterday. FINDINGS: CTA NECK FINDINGS Aortic arch: Aortic atherosclerosis. No aneurysm or brachiocephalic vessel origin stenosis. 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 Right carotid system: Common carotid artery is widely patent to the bifurcation region. Atherosclerotic plaque at carotid bifurcation and ICA bulb but without stenosis compared to the more distal cervical ICA diameter. Left carotid system: Common carotid artery widely patent to the bifurcation. Atherosclerotic plaque at the carotid bifurcation and ICA bulb but without stenosis compared to the more distal cervical ICA diameter. Vertebral arteries: Both vertebral artery origins are widely patent. Both vertebral arteries are widely patent through the cervical region to the foramen magnum. Skeleton: Ordinary cervical spondylosis. Right frontal calvarial mass as described at previous MRI. Other neck: No lymphadenopathy. Enlarged heterogeneous thyroid gland with multiple nodules, the largest measuring 2.1 cm. Upper chest: Negative Review of the MIP images confirms the above findings CTA HEAD FINDINGS Anterior circulation: Both internal carotid arteries are patent through the skull base and siphon regions. Ordinary siphon atherosclerotic calcification but without stenosis greater than 30%. The anterior and middle cerebral vessels are patent without correctable proximal stenosis. The anterior temporal branch of the MCA on the left appears to be occluded. Posterior circulation: Both vertebral arteries are widely patent to the basilar. Mild atherosclerotic  irregularity of the basilar artery with distal basilar narrowing of about 30%. Superior cerebellar and posterior cerebral arteries are patent. Distal PCA branch vessels  show atherosclerotic irregularity. Venous sinuses: Patent and normal. Anatomic variants: None other significant. Review of the MIP images confirms the above findings IMPRESSION: Aortic atherosclerosis. 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 Atherosclerotic change at both carotid bifurcations but no stenosis. Atherosclerotic change of the carotid siphon regions but without stenosis greater than 30%. 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 This is probably chronic, similar to the MR angiography of 2018. Distal vessel atherosclerotic narrowing and irregularity of the MCA and PCA branches. Atherosclerotic irregularity of the basilar artery, maximal stenosis in the distal basilar region of approximately 30%. Right frontal calvarial lytic mass as described by MRI. Differential diagnosis is primarily metastatic disease versus myeloma versus intra osseous meningioma versus enlarging hemangioma. Electronically Signed: By: Paulina FusiMark  Shogry M.D. On: 05/30/2020 12:02    MR ANGIO HEAD WO CONTRAST   Result Date: 05/30/2020 CLINICAL DATA:  Initial evaluation for acute stroke, slurred speech, right-sided weakness. EXAM: MRI HEAD WITHOUT CONTRAST MRA HEAD WITHOUT CONTRAST TECHNIQUE: Multiplanar, multiecho pulse sequences of the brain and surrounding structures were obtained without intravenous contrast. Angiographic images of the head were obtained using MRA technique without contrast. COMPARISON:  Prior CT from earlier same day as well as previous MRI from 03/04/2017. FINDINGS: MRI HEAD FINDINGS Brain: Examination significantly degraded by motion artifact. Diffuse prominence of the CSF containing spaces compatible generalized age-related cerebral atrophy. Patchy T2/FLAIR hyperintensity within the periventricular and deep white matter both cerebral hemispheres most consistent with chronic small vessel ischemic disease, moderate in nature.  Multiple remote lacunar infarcts present about the bilateral basal ganglia and right thalamus. Subtle approximate 6 mm focus of diffusion abnormality seen involving the left paramedian pons, suspicious for an acute ischemic infarct (series 2, image 15). No visible associated hemorrhage or mass effect on this motion degraded exam. No other diffusion abnormality to suggest acute or subacute ischemia. Gray-white matter differentiation otherwise maintained. No other areas of remote cortical infarction. No foci of susceptibility artifact to suggest acute or chronic intracranial hemorrhage on this motion degraded exam. 2.8 cm mass involving the right frontal calvarium with extraosseous extension again seen, indeterminate. No associated mass effect on the subjacent right frontal lobe. No other mass lesion. No mass effect or midline shift. No hydrocephalus or extra-axial fluid collection. Pituitary gland grossly within normal limits. Midline structures intact. Vascular: Major intracranial vascular flow voids are maintained. Skull and upper cervical spine: Craniocervical junction within normal limits. Upper cervical spine normal. Bone marrow signal intensity within normal limits. No other focal marrow replacing lesions. Scalp soft tissues otherwise unremarkable. Sinuses/Orbits: Globes and orbital soft tissues within normal limits. Visualized paranasal sinuses are grossly clear. No significant mastoid effusion. Other: None. MRA HEAD FINDINGS ANTERIOR CIRCULATION: Examination moderately degraded by motion artifact. Visualized distal cervical segments of the internal carotid arteries are patent with antegrade flow. Petrous segments widely patent. Scattered atheromatous irregularity throughout the cavernous/supraclinoid ICAs without hemodynamically significant stenosis. Right A1 segment widely patent. Left A1 hypoplastic and/or absent, accounting for the diminutive left ICA is compared to the right. Normal anterior communicating  artery complex. Anterior cerebral arteries patent to their distal aspects without appreciable stenosis. M1 segments patent bilaterally. Grossly normal MCA bifurcations. Distal MCA branches well perfused and fairly symmetric. POSTERIOR CIRCULATION: Vertebral arteries patent to the vertebrobasilar junction without stenosis.  Left vertebral artery slightly dominant. Neither PICA origin visualized. Basilar widely patent proximally. There is a short-segment moderate stenosis involving the mid basilar artery (series 2, image 91). Basilar otherwise patent to its distal aspect. Superior cerebral arteries grossly patent proximally. PCAs primarily supplied via the basilar, both of which are grossly patent to their distal aspects without definite stenosis, although evaluation limited by motion. No intracranial aneurysm. IMPRESSION: MRI HEAD IMPRESSION: 1. Motion degraded exam. 2. Subtle approximate 6 mm acute ischemic nonhemorrhagic left pontine infarct. 3. 2.8 cm mass involving the right frontal calvarium with extraosseous extension, indeterminate. In retrospect, this lesion appears to have been present on prior MRI from 2018, although is [place] larger on today's exam. Again, primary differential considerations include a possible intra osseous meningioma versus osseous metastasis. Neuro surgical consultation and referral for consideration of possible histologic sampling suggested for further evaluation. Additionally, postcontrast imaging could be performed for complete evaluation as clinically desired. 4. Underlying age-related cerebral atrophy with moderate chronic microvascular ischemic disease, with multiple remote lacunar infarcts about the bilateral basal ganglia and right thalamus. MRA HEAD IMPRESSION: 1. Motion degraded exam. 2. Negative intracranial MRA for large vessel occlusion. 3. 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 4. Additional scattered intracranial atherosclerotic irregularity, with  no other hemodynamically significant or correctable stenosis identified. Electronically Signed   By: Rise Mu M.D.   On: 05/30/2020 00:34    MR BRAIN WO CONTRAST   Result Date: 05/30/2020 CLINICAL DATA:  Initial evaluation for acute stroke, slurred speech, right-sided weakness. EXAM: MRI HEAD WITHOUT CONTRAST MRA HEAD WITHOUT CONTRAST TECHNIQUE: Multiplanar, multiecho pulse sequences of the brain and surrounding structures were obtained without intravenous contrast. Angiographic images of the head were obtained using MRA technique without contrast. COMPARISON:  Prior CT from earlier same day as well as previous MRI from 03/04/2017. FINDINGS: MRI HEAD FINDINGS Brain: Examination significantly degraded by motion artifact. Diffuse prominence of the CSF containing spaces compatible generalized age-related cerebral atrophy. Patchy T2/FLAIR hyperintensity within the periventricular and deep white matter both cerebral hemispheres most consistent with chronic small vessel ischemic disease, moderate in nature. Multiple remote lacunar infarcts present about the bilateral basal ganglia and right thalamus. Subtle approximate 6 mm focus of diffusion abnormality seen involving the left paramedian pons, suspicious for an acute ischemic infarct (series 2, image 15). No visible associated hemorrhage or mass effect on this motion degraded exam. No other diffusion abnormality to suggest acute or subacute ischemia. Gray-white matter differentiation otherwise maintained. No other areas of remote cortical infarction. No foci of susceptibility artifact to suggest acute or chronic intracranial hemorrhage on this motion degraded exam. 2.8 cm mass involving the right frontal calvarium with extraosseous extension again seen, indeterminate. No associated mass effect on the subjacent right frontal lobe. No other mass lesion. No mass effect or midline shift. No hydrocephalus or extra-axial fluid collection. Pituitary gland  grossly within normal limits. Midline structures intact. Vascular: Major intracranial vascular flow voids are maintained. Skull and upper cervical spine: Craniocervical junction within normal limits. Upper cervical spine normal. Bone marrow signal intensity within normal limits. No other focal marrow replacing lesions. Scalp soft tissues otherwise unremarkable. Sinuses/Orbits: Globes and orbital soft tissues within normal limits. Visualized paranasal sinuses are grossly clear. No significant mastoid effusion. Other: None. MRA HEAD FINDINGS ANTERIOR CIRCULATION: Examination moderately degraded by motion artifact. Visualized distal cervical segments of the internal carotid arteries are patent with antegrade flow. Petrous segments widely patent. Scattered atheromatous irregularity throughout the cavernous/supraclinoid ICAs without hemodynamically significant  stenosis. Right A1 segment widely patent. Left A1 hypoplastic and/or absent, accounting for the diminutive left ICA is compared to the right. Normal anterior communicating artery complex. Anterior cerebral arteries patent to their distal aspects without appreciable stenosis. M1 segments patent bilaterally. Grossly normal MCA bifurcations. Distal MCA branches well perfused and fairly symmetric. POSTERIOR CIRCULATION: Vertebral arteries patent to the vertebrobasilar junction without stenosis. Left vertebral artery slightly dominant. Neither PICA origin visualized. Basilar widely patent proximally. There is a short-segment moderate stenosis involving the mid basilar artery (series 2, image 91). Basilar otherwise patent to its distal aspect. Superior cerebral arteries grossly patent proximally. PCAs primarily supplied via the basilar, both of which are grossly patent to their distal aspects without definite stenosis, although evaluation limited by motion. No intracranial aneurysm. IMPRESSION: MRI HEAD IMPRESSION: 1. Motion degraded exam. 2. Subtle approximate 6 mm  acute ischemic nonhemorrhagic left pontine infarct. 3. 2.8 cm mass involving the right frontal calvarium with extraosseous extension, indeterminate. In retrospect, this lesion appears to have been present on prior MRI from 2018, although is [place] larger on today's exam. Again, primary differential considerations include a possible intra osseous meningioma versus osseous metastasis. Neuro surgical consultation and referral for consideration of possible histologic sampling suggested for further evaluation. Additionally, postcontrast imaging could be performed for complete evaluation as clinically desired. 4. Underlying age-related cerebral atrophy with moderate chronic microvascular ischemic disease, with multiple remote lacunar infarcts about the bilateral basal ganglia and right thalamus. MRA HEAD IMPRESSION: 1. Motion degraded exam. 2. Negative intracranial MRA for large vessel occlusion. 3. 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 4. Additional scattered intracranial atherosclerotic irregularity, with no other hemodynamically significant or correctable stenosis identified. Electronically Signed   By: Rise Mu M.D.   On: 05/30/2020 00:34    ECHOCARDIOGRAM COMPLETE   Result Date: 05/30/2020    ECHOCARDIOGRAM REPORT   Patient Name:   Ann Coleman    Date of Exam: 05/30/2020 Medical Rec #:  [id number]  Height:       59.0 in Accession #:    [id number] Weight:       151.5 lb Date of Birth:  Sep 21, 1924   BSA:          1.639 m Patient Age:    86 years   BP:           136/101 mmHg Patient Gender: F          HR:           75 bpm. Exam Location:  Inpatient Procedure: 2D Echo, Cardiac Doppler and Color Doppler Indications:    Stroke 434.91 / I163.9  History:        Patient has prior history of Echocardiogram examinations, most                 recent 03/06/2017. Risk Factors:Hypertension and Dyslipidemia.  Sonographer:    Elmarie Shiley Dance Referring Phys: [id number] VASUNDHRA RATHORE IMPRESSIONS  1.  Intracavitary gradient. Peak velocity 2.1 m/s. Peak gradient 17.6 mmHg. Left ventricular ejection fraction, by estimation, is >75%. The left ventricle has hyperdynamic function. The left ventricle has no regional wall motion abnormalities. There is mild concentric left ventricular hypertrophy. Left ventricular diastolic parameters are consistent with Grade I diastolic dysfunction (impaired relaxation).  2. Right ventricular systolic function is normal. The right ventricular size is normal. There is normal pulmonary artery systolic pressure.  3. Left atrial size was mildly dilated.  4. The mitral valve is normal in structure. No evidence of mitral  valve regurgitation. No evidence of mitral stenosis.  5. The aortic valve is tricuspid. Aortic valve regurgitation is mild. No aortic stenosis is present.  6. The inferior vena cava is normal in size with greater than 50% respiratory variability, suggesting right atrial pressure of 3 mmHg. FINDINGS  Left Ventricle: Intracavitary gradient. Peak velocity 2.1 m/s. Peak gradient 17.6 mmHg. Left ventricular ejection fraction, by estimation, is >75%. The left ventricle has hyperdynamic function. The left ventricle has no regional wall motion abnormalities. The left ventricular internal cavity size was normal in size. There is mild concentric left ventricular hypertrophy. Left ventricular diastolic parameters are consistent with Grade I diastolic dysfunction (impaired relaxation). Right Ventricle: The right ventricular size is normal. No increase in right ventricular wall thickness. Right ventricular systolic function is normal. There is normal pulmonary artery systolic pressure. The tricuspid regurgitant velocity is 1.67 m/s, and  with an assumed right atrial pressure of 3 mmHg, the estimated right ventricular systolic pressure is 14.2 mmHg. Left Atrium: Left atrial size was mildly dilated. Right Atrium: Right atrial size was normal in size. Pericardium: A small pericardial  effusion is present. There is no evidence of cardiac tamponade. Mitral Valve: The mitral valve is normal in structure. Normal mobility of the mitral valve leaflets. No evidence of mitral valve regurgitation. No evidence of mitral valve stenosis. Tricuspid Valve: The tricuspid valve is normal in structure. Tricuspid valve regurgitation is trivial. No evidence of tricuspid stenosis. Aortic Valve: The aortic valve is tricuspid. Aortic valve regurgitation is mild. Aortic regurgitation PHT measures 300 msec. No aortic stenosis is present. Pulmonic Valve: The pulmonic valve was normal in structure. Pulmonic valve regurgitation is trivial. No evidence of pulmonic stenosis. Aorta: The aortic root is normal in size and structure. Venous: The inferior vena cava is normal in size with greater than 50% respiratory variability, suggesting right atrial pressure of 3 mmHg. IAS/Shunts: No atrial level shunt detected by color flow Doppler.  LEFT VENTRICLE PLAX 2D LVIDd:         3.21 cm LVIDs:         2.24 cm LV PW:         1.26 cm LV IVS:        1.20 cm LVOT diam:     2.00 cm LV SV:         75 LV SV Index:   46 LVOT Area:     3.14 cm  RIGHT VENTRICLE            IVC RV Basal diam:  2.16 cm    IVC diam: 1.53 cm RV S prime:     9.03 cm/s TAPSE (M-mode): 1.9 cm LEFT ATRIUM             Index       RIGHT ATRIUM           Index LA diam:        4.20 cm 2.56 cm/m  RA Area:     12.50 cm LA Vol (A2C):   32.7 ml 19.95 ml/m RA Volume:   27.70 ml  16.90 ml/m LA Vol (A4C):   50.2 ml 30.63 ml/m LA Biplane Vol: 41.9 ml 25.57 ml/m  AORTIC VALVE LVOT Vmax:   101.00 cm/s LVOT Vmean:  75.100 cm/s LVOT VTI:    0.238 m AI PHT:      300 msec  AORTA Ao Root diam: 2.90 cm Ao Asc diam:  3.50 cm MITRAL VALVE  TRICUSPID VALVE MV Area (PHT): 2.50 cm     TR Peak grad:   11.2 mmHg MV Decel Time: 303 msec     TR Vmax:        167.00 cm/s MV E velocity: 69.20 cm/s MV A velocity: 127.00 cm/s  SHUNTS MV E/A ratio:  0.54         Systemic VTI:  0.24  m                             Systemic Diam: 2.00 cm Chilton Si MD Electronically signed by Chilton Si MD Signature Date/Time: 05/30/2020/12:19:32 PM    Final     CT HEAD CODE STROKE WO CONTRAST   Result Date: 05/29/2020 CLINICAL DATA:  Code stroke. Initial evaluation for acute slurred speech, leg weakness. EXAM: CT HEAD WITHOUT CONTRAST TECHNIQUE: Contiguous axial images were obtained from the base of the skull through the vertex without intravenous contrast. COMPARISON:  Prior study from 03/04/2017. FINDINGS: Brain: Generalized age-related cerebral atrophy with moderate chronic microvascular ischemic disease. Scattered remote lacunar infarcts present about the bilateral basal ganglia and right internal capsule/thalamus. No acute intracranial hemorrhage. No acute large vessel territory infarct. No parenchymal mass lesion, mass effect, or midline shift. No hydrocephalus or extra-axial fluid collection. There is a hyperdense ovoid lesion measuring approximately 2.5 cm positioned within the right frontal calvarium (series 3, image 18). Upon review of prior CT from 2018, this lesion was likely present, although faintly visible at that time. Associated lytic changes within the underlying calvarium with probable associated pathologic fracture (series 4, image 46). Soft tissue component extends into the overlying right frontal scalp as well. Vascular: No hyperdense vessel. Calcified atherosclerosis present at the skull base. Skull: 2.5 cm lytic lesion involving the right frontal calvarium as above. Calvarium otherwise intact. No other scalp soft tissue abnormality. Sinuses/Orbits: Globes and orbital soft tissues within normal limits. Scattered mucosal thickening noted within the visualized ethmoidal air cells. Visualized paranasal sinuses are otherwise clear. No mastoid effusion. Other: None. ASPECTS [facility] Stroke Program Early CT Score) - Ganglionic level infarction (caudate, lentiform nuclei, internal  capsule, insula, M1-M3 cortex): 7 - Supraganglionic infarction (M4-M6 cortex): 3 Total score (0-10 with 10 being normal): 10 IMPRESSION: 1. No acute intracranial infarct or other abnormality. 2. ASPECTS is 10. 3. Approximate 2.5 cm lytic lesion involving the right frontal calvarium with probable associated pathologic fracture. Finding is nonspecific, although is suspected to have been present on previous CT from 2018, although only faintly visible at that time. Primary differential consideration consists of an intra osseous meningioma, although possible osseous metastasis not excluded. Further evaluation with dedicated brain MRI, with and without contrast suggested for further evaluation. Additionally, neuro surgical consultation and referral for possible histologic sampling suggested. 4. Underlying age-related cerebral atrophy with moderate chronic microvascular ischemic disease, with multiple remote lacunar infarcts about the bilateral basal ganglia as above. Critical Value/emergent results were called by telephone at the time of interpretation on 05/29/2020 at 10:32 pm to provider [facility] , who verbally acknowledged these results. Electronically Signed   By: Rise Mu M.D.   On: 05/29/2020 22:36         Assessment/Plan: Diagnosis: Acute left pontine stroke 1. Does the need for close, 24 hr/day medical supervision in concert with the patient's rehab needs make it unreasonable for this patient to be served in a less intensive setting? Yes 2. Co-Morbidities requiring supervision/potential complications: hypertensive urgency, primary oral dysphagia,  HLD, right sided flaccidity, prediabetes 3. Due to bladder management, bowel management, safety, skin/wound care, disease management, medication administration, pain management and patient education, does the patient require 24 hr/day rehab nursing? Yes 4. Does the patient require coordinated care of a physician, rehab nurse, therapy disciplines of  PT, OT, SLP to address physical and functional deficits in the context of the above medical diagnosis(es)? Yes Addressing deficits in the following areas: balance, endurance, locomotion, strength, transferring, bowel/bladder control, bathing, dressing, feeding, grooming, toileting, swallowing and psychosocial support 5. Can the patient actively participate in an intensive therapy program of at least 3 hrs of therapy per day at least 5 days per week? Yes 6. The potential for patient to make measurable gains while on inpatient rehab is excellent 7. Anticipated functional outcomes upon discharge from inpatient rehab are mod assist  with PT, mod assist with OT, min assist with SLP. 8. Estimated rehab length of stay to reach the above functional goals is: 10-14 days 9. Anticipated discharge destination: Home 10. Overall Rehab/Functional Prognosis: excellent   RECOMMENDATIONS: This patient's condition is appropriate for continued rehabilitative care in the following setting: CIR Patient has agreed to participate in recommended program. Yes Note that insurance prior authorization may be required for reimbursement for recommended care.   Comment: Ann Coleman would be an excellent CIR candidate. Right sided flaccidity is different from yesterday when she was moving arm well as per daughter and notes. Discussed with Dr. Rhona Leavens and stat MRI is being obtained now. She has spoke with Dr. Roda Shutters and given patient's age, conservative care will likely be recommended regardless of evolving infarct. Patient lives with her daughter. She was independent at baseline and was able to ambulate with a cane. She will likely require significant assistance from her daughter upon discharge. Thank you for this consult. We will continue to follow in Ann Coleman's care.   Mcarthur Rossetti Angiulli, PA-C 05/30/2020    I have personally performed a face to face diagnostic evaluation, including, but not limited to relevant history and physical exam  findings, of this patient and developed relevant assessment and plan.  Additionally, I have reviewed and concur with the physician assistant's documentation above.   Sula Soda, MD        Revision History                     Routing History           Note Details  Author Carlis Abbott, Drema Pry, MD File Time 05/31/2020 12:03 PM  Author Type Physician Status Signed  Last Editor Ann Chin, MD Service Physical Medicine and Rehabilitation  Hospital Acct # [id number] Admit Date 06/02/2020

## 2020-06-02 NOTE — Discharge Summary (Signed)
Physician Discharge Summary  Ann Coleman ZOX:[id number] DOB: 1924/11/19 DOA: 05/29/2020  PCP: Patient, No Pcp Per  Admit date: 05/29/2020 Discharge date: 06/02/2020  Admitted From: Home Disposition:  CIR  Recommendations for Outpatient Follow-up:  1. Follow up with PCP in 1-2 weeks 2. Follow up with Neurology as scheduled  Discharge Condition:Stable CODE STATUS:DNR Diet recommendation:  Dysphagia 2 with nectar thick liquids  Brief/Interim Summary: 86 y.o.Cambodian speaking femalewith medical history significant ofstroke in 2018, hypertension, hyperlipidemiapresenting to the ED via EMS as code stroke. Daughter at bedside states patient was doing well during the day and around 4 PM today she could not get up to use the bathroom as her legs were weak. Family also noticed that her speech was slurred. When EMS arrived, they noticed that patient had right arm drift. Daughter states patient is doing well otherwise and does not take any medications at home. No complaints of fevers, cough, shortness of breath, chest pain, nausea, vomiting, abdominal pain, or diarrhea.  ED Course:Hypertensive with systolic in the 190s. Stat Head CT negative for acute intracranial infarct. Showing an approximate 2.5 cm lytic lesion involving the right frontal calvarium with possible associated pathologic fracture. Finding is felt to be nonspecific, suspected to have been present on previous CT from 2018, although only faintly visible at that time. Brain MRI recommended for further evaluation. Patient was seen by neurology. tPA not given as she was outside the window at the time of presentation. Due to focal symptoms, stat brain MRI was ordered and reviewed by neurology -showing an acute left pontine stroke which is felt to be the likely explanation for her right-sided weakness.  Discharge Diagnoses:  Principal Problem:   Acute CVA (cerebrovascular accident) (HCC) Active Problems:   Hypertensive urgency   HLD  (hyperlipidemia)  Acute CVA:  -Presented with slurred speech, lower extremity weakness, and right arm drift. tPA was not given as she was outside the window at the time of presentation. Head CT negative for acute intracranial infarct. Brain MRI reviewed, confirmed acute left pontine stroke. -2D echocardiogram with normal LVEF -Hemoglobin A1c pending -LDL of 136 -Per Neurology, plan for aspirin 81mg  with plavix x 3 weeks, then ASA alone -Atorvastatin40 mg daily -PT, OT, speech therapy. -Currently on dysphagia 2 diet per SLP -On 7/1, pt noted to have worse R sided weakness. MRI brain reviewed, confirmed worse stroke. -Symptoms improved but again with increased weakness on 7/3. Have discussed with stroke MD. No further neurologic work up was recommended by Neurologist. Neuro recommendation to proceed to CIR to begin therapy -Plan d/c to CIR today  Right calvarial lytic lesion: -Showing an approximate 2.5 cm lytic lesion involving the right frontal calvarium with possible associated pathologic fracture. Finding is felt to be nonspecific, suspected to have been present on previous CT from 2018, although only faintly visible at that time.  -discussed with Neurology, recommendation to have this lesion f/u as outpatient  Hypertensive urgency: Patient is not taking any antihypertensives at home. Blood pressure significantly elevated on arrival with systolic in the 190s. Systolic now down to 130s. -Plan to gradually normalize BP as tolerated  Hyperlipidemia: -Start high intensity statin given acute stroke per above   Discharge Instructions  Discharge Instructions    Ambulatory referral to Neurology   Complete by: As directed    Follow up with Dr. 2019 at [facility] in 4-6 weeks. She is Dr [facility] pt in the past. Thanks.     Allergies as of 06/02/2020   No Known Allergies  Medication List    STOP taking these medications   acetaminophen 500 MG tablet Commonly known as:  TYLENOL   amLODipine 5 MG tablet Commonly known as: NORVASC   aspirin 325 MG tablet Replaced by: aspirin 81 MG EC tablet   hydrochlorothiazide 12.5 MG capsule Commonly known as: MICROZIDE   simvastatin 10 MG tablet Commonly known as: ZOCOR     TAKE these medications   aspirin 81 MG EC tablet Take 1 tablet (81 mg total) by mouth daily. Swallow whole. Start taking on: June 03, 2020 Replaces: aspirin 325 MG tablet   atorvastatin 40 MG tablet Commonly known as: LIPITOR Take 1 tablet (40 mg total) by mouth daily. Start taking on: June 03, 2020   clopidogrel 75 MG tablet Commonly known as: PLAVIX Take 1 tablet (75 mg total) by mouth daily. Start taking on: June 03, 2020       Follow-up Information    Anson Fret, MD. Schedule an appointment as soon as possible for a visit in 4 week(s).   Specialty: Neurology Contact information: [address] [place] [address] [phone]              No Known Allergies  Consultations:  Neurology  CIR  Procedures/Studies: CT ANGIO HEAD W OR WO CONTRAST  Addendum Date: 05/30/2020   ADDENDUM REPORT: 05/30/2020 13:23 ADDENDUM: Enlarged heterogeneous thyroid gland. Largest single nodule 2 cm. recommend thyroid ultrasound (ref: J Am Coll Radiol. 2015 Feb;12(2): 143-50). Electronically Signed   By: Paulina Fusi M.D.   On: 05/30/2020 13:23   Result Date: 05/30/2020 CLINICAL DATA:  Acute presentation with slurred speech and leg weakness. Small left pontine infarction suspected by MRI. EXAM: CT ANGIOGRAPHY HEAD AND NECK TECHNIQUE: Multidetector CT imaging of the head and neck was performed using the standard protocol during bolus administration of intravenous contrast. Multiplanar CT image reconstructions and MIPs were obtained to evaluate the vascular anatomy. Carotid stenosis measurements (when applicable) are obtained utilizing NASCET criteria, using the distal internal carotid diameter as the denominator. CONTRAST:  75mL  OMNIPAQUE IOHEXOL 350 MG/ML SOLN COMPARISON:  MR studies done yesterday. FINDINGS: CTA NECK FINDINGS Aortic arch: Aortic atherosclerosis. No aneurysm or brachiocephalic vessel origin stenosis. 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 Right carotid system: Common carotid artery is widely patent to the bifurcation region. Atherosclerotic plaque at carotid bifurcation and ICA bulb but without stenosis compared to the more distal cervical ICA diameter. Left carotid system: Common carotid artery widely patent to the bifurcation. Atherosclerotic plaque at the carotid bifurcation and ICA bulb but without stenosis compared to the more distal cervical ICA diameter. Vertebral arteries: Both vertebral artery origins are widely patent. Both vertebral arteries are widely patent through the cervical region to the foramen magnum. Skeleton: Ordinary cervical spondylosis. Right frontal calvarial mass as described at previous MRI. Other neck: No lymphadenopathy. Enlarged heterogeneous thyroid gland with multiple nodules, the largest measuring 2.1 cm. Upper chest: Negative Review of the MIP images confirms the above findings CTA HEAD FINDINGS Anterior circulation: Both internal carotid arteries are patent through the skull base and siphon regions. Ordinary siphon atherosclerotic calcification but without stenosis greater than 30%. The anterior and middle cerebral vessels are patent without correctable proximal stenosis. The anterior temporal branch of the MCA on the left appears to be occluded. Posterior circulation: Both vertebral arteries are widely patent to the basilar. Mild atherosclerotic irregularity of the basilar artery with distal basilar narrowing of about 30%.  Superior cerebellar and posterior cerebral arteries are patent. Distal PCA branch vessels show atherosclerotic irregularity. Venous sinuses: Patent and normal. Anatomic variants: None other significant. Review of the MIP  images confirms the above findings IMPRESSION: Aortic atherosclerosis. 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 Atherosclerotic change at both carotid bifurcations but no stenosis. Atherosclerotic change of the carotid siphon regions but without stenosis greater than 30%. 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 This is probably chronic, similar to the MR angiography of 2018. Distal vessel atherosclerotic narrowing and irregularity of the MCA and PCA branches. Atherosclerotic irregularity of the basilar artery, maximal stenosis in the distal basilar region of approximately 30%. Right frontal calvarial lytic mass as described by MRI. Differential diagnosis is primarily metastatic disease versus myeloma versus intra osseous meningioma versus enlarging hemangioma. Electronically Signed: By: Paulina Fusi M.D. On: 05/30/2020 12:02   CT ANGIO NECK W OR WO CONTRAST  Addendum Date: 05/30/2020   ADDENDUM REPORT: 05/30/2020 13:23 ADDENDUM: Enlarged heterogeneous thyroid gland. Largest single nodule 2 cm. recommend thyroid ultrasound (ref: J Am Coll Radiol. 2015 Feb;12(2): 143-50). Electronically Signed   By: Paulina Fusi M.D.   On: 05/30/2020 13:23   Result Date: 05/30/2020 CLINICAL DATA:  Acute presentation with slurred speech and leg weakness. Small left pontine infarction suspected by MRI. EXAM: CT ANGIOGRAPHY HEAD AND NECK TECHNIQUE: Multidetector CT imaging of the head and neck was performed using the standard protocol during bolus administration of intravenous contrast. Multiplanar CT image reconstructions and MIPs were obtained to evaluate the vascular anatomy. Carotid stenosis measurements (when applicable) are obtained utilizing NASCET criteria, using the distal internal carotid diameter as the denominator. CONTRAST:  75mL OMNIPAQUE IOHEXOL 350 MG/ML SOLN COMPARISON:  MR studies done yesterday. FINDINGS: CTA NECK FINDINGS  Aortic arch: Aortic atherosclerosis. No aneurysm or brachiocephalic vessel origin stenosis. 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 Right carotid system: Common carotid artery is widely patent to the bifurcation region. Atherosclerotic plaque at carotid bifurcation and ICA bulb but without stenosis compared to the more distal cervical ICA diameter. Left carotid system: Common carotid artery widely patent to the bifurcation. Atherosclerotic plaque at the carotid bifurcation and ICA bulb but without stenosis compared to the more distal cervical ICA diameter. Vertebral arteries: Both vertebral artery origins are widely patent. Both vertebral arteries are widely patent through the cervical region to the foramen magnum. Skeleton: Ordinary cervical spondylosis. Right frontal calvarial mass as described at previous MRI. Other neck: No lymphadenopathy. Enlarged heterogeneous thyroid gland with multiple nodules, the largest measuring 2.1 cm. Upper chest: Negative Review of the MIP images confirms the above findings CTA HEAD FINDINGS Anterior circulation: Both internal carotid arteries are patent through the skull base and siphon regions. Ordinary siphon atherosclerotic calcification but without stenosis greater than 30%. The anterior and middle cerebral vessels are patent without correctable proximal stenosis. The anterior temporal branch of the MCA on the left appears to be occluded. Posterior circulation: Both vertebral arteries are widely patent to the basilar. Mild atherosclerotic irregularity of the basilar artery with distal basilar narrowing of about 30%. Superior cerebellar and posterior cerebral arteries are patent. Distal PCA branch vessels show atherosclerotic irregularity. Venous sinuses: Patent and normal. Anatomic variants: None other significant. Review of the MIP images confirms the above findings IMPRESSION: Aortic atherosclerosis. Anomalous origin of the right  subclavian artery as the last vessel from the arch. Atherosclerotic change  at both carotid bifurcations but no stenosis. Atherosclerotic change of the carotid siphon regions but without stenosis greater than 30%. 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 This is probably chronic, similar to the MR angiography of 2018. Distal vessel atherosclerotic narrowing and irregularity of the MCA and PCA branches. Atherosclerotic irregularity of the basilar artery, maximal stenosis in the distal basilar region of approximately 30%. Right frontal calvarial lytic mass as described by MRI. Differential diagnosis is primarily metastatic disease versus myeloma versus intra osseous meningioma versus enlarging hemangioma. Electronically Signed: By: Paulina Fusi M.D. On: 05/30/2020 12:02   MR ANGIO HEAD WO CONTRAST  Result Date: 05/30/2020 CLINICAL DATA:  Initial evaluation for acute stroke, slurred speech, right-sided weakness. EXAM: MRI HEAD WITHOUT CONTRAST MRA HEAD WITHOUT CONTRAST TECHNIQUE: Multiplanar, multiecho pulse sequences of the brain and surrounding structures were obtained without intravenous contrast. Angiographic images of the head were obtained using MRA technique without contrast. COMPARISON:  Prior CT from earlier same day as well as previous MRI from 03/04/2017. FINDINGS: MRI HEAD FINDINGS Brain: Examination significantly degraded by motion artifact. Diffuse prominence of the CSF containing spaces compatible generalized age-related cerebral atrophy. Patchy T2/FLAIR hyperintensity within the periventricular and deep white matter both cerebral hemispheres most consistent with chronic small vessel ischemic disease, moderate in nature. Multiple remote lacunar infarcts present about the bilateral basal ganglia and right thalamus. Subtle approximate 6 mm focus of diffusion abnormality seen involving the left paramedian pons, suspicious for an acute ischemic infarct (series  2, image 15). No visible associated hemorrhage or mass effect on this motion degraded exam. No other diffusion abnormality to suggest acute or subacute ischemia. Gray-white matter differentiation otherwise maintained. No other areas of remote cortical infarction. No foci of susceptibility artifact to suggest acute or chronic intracranial hemorrhage on this motion degraded exam. 2.8 cm mass involving the right frontal calvarium with extraosseous extension again seen, indeterminate. No associated mass effect on the subjacent right frontal lobe. No other mass lesion. No mass effect or midline shift. No hydrocephalus or extra-axial fluid collection. Pituitary gland grossly within normal limits. Midline structures intact. Vascular: Major intracranial vascular flow voids are maintained. Skull and upper cervical spine: Craniocervical junction within normal limits. Upper cervical spine normal. Bone marrow signal intensity within normal limits. No other focal marrow replacing lesions. Scalp soft tissues otherwise unremarkable. Sinuses/Orbits: Globes and orbital soft tissues within normal limits. Visualized paranasal sinuses are grossly clear. No significant mastoid effusion. Other: None. MRA HEAD FINDINGS ANTERIOR CIRCULATION: Examination moderately degraded by motion artifact. Visualized distal cervical segments of the internal carotid arteries are patent with antegrade flow. Petrous segments widely patent. Scattered atheromatous irregularity throughout the cavernous/supraclinoid ICAs without hemodynamically significant stenosis. Right A1 segment widely patent. Left A1 hypoplastic and/or absent, accounting for the diminutive left ICA is compared to the right. Normal anterior communicating artery complex. Anterior cerebral arteries patent to their distal aspects without appreciable stenosis. M1 segments patent bilaterally. Grossly normal MCA bifurcations. Distal MCA branches well perfused and fairly symmetric. POSTERIOR  CIRCULATION: Vertebral arteries patent to the vertebrobasilar junction without stenosis. Left vertebral artery slightly dominant. Neither PICA origin visualized. Basilar widely patent proximally. There is a short-segment moderate stenosis involving the mid basilar artery (series 2, image 91). Basilar otherwise patent to its distal aspect. Superior cerebral arteries grossly patent proximally. PCAs primarily supplied via the basilar, both of which are grossly patent to their distal aspects without definite stenosis, although evaluation limited by motion. No intracranial aneurysm.  IMPRESSION: MRI HEAD IMPRESSION: 1. Motion degraded exam. 2. Subtle approximate 6 mm acute ischemic nonhemorrhagic left pontine infarct. 3. 2.8 cm mass involving the right frontal calvarium with extraosseous extension, indeterminate. In retrospect, this lesion appears to have been present on prior MRI from 2018, although is [place] larger on today's exam. Again, primary differential considerations include a possible intra osseous meningioma versus osseous metastasis. Neuro surgical consultation and referral for consideration of possible histologic sampling suggested for further evaluation. Additionally, postcontrast imaging could be performed for complete evaluation as clinically desired. 4. Underlying age-related cerebral atrophy with moderate chronic microvascular ischemic disease, with multiple remote lacunar infarcts about the bilateral basal ganglia and right thalamus. MRA HEAD IMPRESSION: 1. Motion degraded exam. 2. Negative intracranial MRA for large vessel occlusion. 3. 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 4. Additional scattered intracranial atherosclerotic irregularity, with no other hemodynamically significant or correctable stenosis identified. Electronically Signed   By: Rise Mu M.D.   On: 05/30/2020 00:34   MR BRAIN WO CONTRAST  Result Date: 05/31/2020 CLINICAL DATA:  Pontine infarct,  follow-up EXAM: MRI HEAD WITHOUT CONTRAST TECHNIQUE: Multiplanar, multiecho pulse sequences of the brain and surrounding structures were obtained without intravenous contrast. Specifically, axial and coronal DWI and axial T2 FLAIR sequences were obtained. COMPARISON:  05/29/2020 FINDINGS: Brain: Increase in size of acute infarction involving left parasagittal pons. This now measures approximately up to 13 mm (previously 6 mm). Ventricles are stable in size. Findings of chronic microvascular ischemic changes and chronic small vessel infarct again noted. Vascular: Major vessel flow voids at the skull base are preserved. Skull and upper cervical spine: Right frontal calvarial lesion with extraosseous extension is again identified and incompletely evaluated on this study. Sinuses/Orbits: Mild paranasal sinus mucosal thickening. Orbits are unremarkable. Other: None. IMPRESSION: Increase in size of small acute infarct of the left parasagittal pons. Electronically Signed   By: Guadlupe Spanish M.D.   On: 05/31/2020 19:31   MR BRAIN WO CONTRAST  Result Date: 05/30/2020 CLINICAL DATA:  Initial evaluation for acute stroke, slurred speech, right-sided weakness. EXAM: MRI HEAD WITHOUT CONTRAST MRA HEAD WITHOUT CONTRAST TECHNIQUE: Multiplanar, multiecho pulse sequences of the brain and surrounding structures were obtained without intravenous contrast. Angiographic images of the head were obtained using MRA technique without contrast. COMPARISON:  Prior CT from earlier same day as well as previous MRI from 03/04/2017. FINDINGS: MRI HEAD FINDINGS Brain: Examination significantly degraded by motion artifact. Diffuse prominence of the CSF containing spaces compatible generalized age-related cerebral atrophy. Patchy T2/FLAIR hyperintensity within the periventricular and deep white matter both cerebral hemispheres most consistent with chronic small vessel ischemic disease, moderate in nature. Multiple remote lacunar infarcts present  about the bilateral basal ganglia and right thalamus. Subtle approximate 6 mm focus of diffusion abnormality seen involving the left paramedian pons, suspicious for an acute ischemic infarct (series 2, image 15). No visible associated hemorrhage or mass effect on this motion degraded exam. No other diffusion abnormality to suggest acute or subacute ischemia. Gray-white matter differentiation otherwise maintained. No other areas of remote cortical infarction. No foci of susceptibility artifact to suggest acute or chronic intracranial hemorrhage on this motion degraded exam. 2.8 cm mass involving the right frontal calvarium with extraosseous extension again seen, indeterminate. No associated mass effect on the subjacent right frontal lobe. No other mass lesion. No mass effect or midline shift. No hydrocephalus or extra-axial fluid collection. Pituitary gland grossly within normal limits. Midline structures intact. Vascular: Major intracranial vascular flow voids are  maintained. Skull and upper cervical spine: Craniocervical junction within normal limits. Upper cervical spine normal. Bone marrow signal intensity within normal limits. No other focal marrow replacing lesions. Scalp soft tissues otherwise unremarkable. Sinuses/Orbits: Globes and orbital soft tissues within normal limits. Visualized paranasal sinuses are grossly clear. No significant mastoid effusion. Other: None. MRA HEAD FINDINGS ANTERIOR CIRCULATION: Examination moderately degraded by motion artifact. Visualized distal cervical segments of the internal carotid arteries are patent with antegrade flow. Petrous segments widely patent. Scattered atheromatous irregularity throughout the cavernous/supraclinoid ICAs without hemodynamically significant stenosis. Right A1 segment widely patent. Left A1 hypoplastic and/or absent, accounting for the diminutive left ICA is compared to the right. Normal anterior communicating artery complex. Anterior cerebral  arteries patent to their distal aspects without appreciable stenosis. M1 segments patent bilaterally. Grossly normal MCA bifurcations. Distal MCA branches well perfused and fairly symmetric. POSTERIOR CIRCULATION: Vertebral arteries patent to the vertebrobasilar junction without stenosis. Left vertebral artery slightly dominant. Neither PICA origin visualized. Basilar widely patent proximally. There is a short-segment moderate stenosis involving the mid basilar artery (series 2, image 91). Basilar otherwise patent to its distal aspect. Superior cerebral arteries grossly patent proximally. PCAs primarily supplied via the basilar, both of which are grossly patent to their distal aspects without definite stenosis, although evaluation limited by motion. No intracranial aneurysm. IMPRESSION: MRI HEAD IMPRESSION: 1. Motion degraded exam. 2. Subtle approximate 6 mm acute ischemic nonhemorrhagic left pontine infarct. 3. 2.8 cm mass involving the right frontal calvarium with extraosseous extension, indeterminate. In retrospect, this lesion appears to have been present on prior MRI from 2018, although is [place] larger on today's exam. Again, primary differential considerations include a possible intra osseous meningioma versus osseous metastasis. Neuro surgical consultation and referral for consideration of possible histologic sampling suggested for further evaluation. Additionally, postcontrast imaging could be performed for complete evaluation as clinically desired. 4. Underlying age-related cerebral atrophy with moderate chronic microvascular ischemic disease, with multiple remote lacunar infarcts about the bilateral basal ganglia and right thalamus. MRA HEAD IMPRESSION: 1. Motion degraded exam. 2. Negative intracranial MRA for large vessel occlusion. 3. 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 4. Additional scattered intracranial atherosclerotic irregularity, with no other hemodynamically  significant or correctable stenosis identified. Electronically Signed   By: Rise Mu M.D.   On: 05/30/2020 00:34   DG Swallowing Func-Speech Pathology  Result Date: 06/01/2020 Objective Swallowing Evaluation: Type of Study: MBS-Modified Barium Swallow Study  Patient Details Name: Ann Coleman MRN: [id number] Date of Birth: [birth year] Today's Date: 06/01/2020 Time: SLP Start Time (ACUTE ONLY): 1100 -SLP Stop Time (ACUTE ONLY): 1125 SLP Time Calculation (min) (ACUTE ONLY): 25 min Past Medical History: Past Medical History: Diagnosis Date . High cholesterol  . Hypertension  . Stroke [facility])  Past Surgical History: Past Surgical History: Procedure Laterality Date . NO PAST SURGERIES   HPI: Ann Coleman is a 86 y.o. [name] speaking female with medical history significant of stroke in 2018, hypertension, hyperlipidemia.  Daughter reported pt could not get up to use the bathroom as her legs were weak.  Family also noticed that her speech was slurred.  When EMS arrived, they noticed that patient had right arm drift.  stat brain MRI was ordered and reviewed by neurology -showing an acute left pontine stroke which is felt to be the likely explanation for her right-sided weakness. Pt passed [facility] in ED, but struggled taking a pill later.  No data recorded Assessment / Plan / Recommendation CHL IP CLINICAL IMPRESSIONS 05/31/2020 Clinical  Impression Pt demonstrates a primary oral dysphagia with buccal weakness but strong lingual manipulation. With extra time she is able to gather boluses over two piecemeal efforts for propulsion to pharynx. She has instances of trace oral residue with liquids that she does gather and propel, but does not immediately swallow, which resulted in some trace sensed aspiration. Pharyngeal impairment is mostly characterized by delayed initiation with trace sensed aspiration of thin liquids before the swallow, seem with straw sips or very large sips. Pt is recommended to consume nectar thick drinks and  soft ground (dys 2) solids (food from home permitted) and can also have teaspoons of thin soup. SLP will f/u, likely will be able to uprgrade to thin with further training for small sips and a dry swallow to fully clear residue.  SLP Visit Diagnosis Dysphagia, oral phase (R13.11);Dysphagia, oropharyngeal phase (R13.12) Attention and concentration deficit following -- Frontal lobe and executive function deficit following -- Impact on safety and function Mild aspiration risk   CHL IP TREATMENT RECOMMENDATION 05/31/2020 Treatment Recommendations Therapy as outlined in treatment plan below   Prognosis 05/31/2020 Prognosis for Safe Diet Advancement Good Barriers to Reach Goals -- Barriers/Prognosis Comment -- CHL IP DIET RECOMMENDATION 05/31/2020 SLP Diet Recommendations Dysphagia 2 (Fine chop) solids;Nectar thick liquid Liquid Administration via Cup;Straw Medication Administration Whole meds with liquid Compensations Slow rate;Small sips/bites;Lingual sweep for clearance of pocketing;Multiple dry swallows after each bite/sip Postural Changes Remain semi-upright after after feeds/meals (Comment)   CHL IP OTHER RECOMMENDATIONS 05/31/2020 Recommended Consults -- Oral Care Recommendations Oral care BID Other Recommendations --   CHL IP FOLLOW UP RECOMMENDATIONS 05/31/2020 Follow up Recommendations Inpatient Rehab   CHL IP FREQUENCY AND DURATION 05/31/2020 Speech Therapy Frequency (ACUTE ONLY) min 2x/week Treatment Duration 2 weeks      CHL IP ORAL PHASE 05/31/2020 Oral Phase Impaired Oral - Pudding Teaspoon -- Oral - Pudding Cup -- Oral - Honey Teaspoon -- Oral - Honey Cup -- Oral - Nectar Teaspoon -- Oral - Nectar Cup Decreased bolus cohesion;Left pocketing in lateral sulci;Right pocketing in lateral sulci;Lingual/palatal residue;Delayed oral transit Oral - Nectar Straw Decreased bolus cohesion;Left pocketing in lateral sulci;Right pocketing in lateral sulci;Lingual/palatal residue;Delayed oral transit Oral - Thin Teaspoon -- Oral -  Thin Cup Decreased bolus cohesion;Left pocketing in lateral sulci;Right pocketing in lateral sulci;Lingual/palatal residue;Delayed oral transit Oral - Thin Straw Decreased bolus cohesion;Left pocketing in lateral sulci;Right pocketing in lateral sulci;Lingual/palatal residue;Delayed oral transit Oral - Puree Decreased bolus cohesion;Left pocketing in lateral sulci;Right pocketing in lateral sulci;Lingual/palatal residue;Delayed oral transit Oral - Mech Soft Decreased bolus cohesion;Left pocketing in lateral sulci;Right pocketing in lateral sulci;Lingual/palatal residue;Delayed oral transit Oral - Regular -- Oral - Multi-Consistency -- Oral - Pill -- Oral Phase - Comment Pt demonstrates a primary oral dysphagia with buccal weakness but strong lingual manipulation. With extra time she is albe to gather boluses over two piecemeal efforts for propulsion to pharynx. She has instances of trace oral residue with liquids that she does gather and propel, but does not immediately swallow, which resuleted in some trace sensed aspiration. Pharyngeal impairment is mostly characterized by delayed initiation with trace sensed aspriation of thin liquids before the swallow, seem with stra sips or very large sips. Pt is recommended to consume nectar thick drinks and soft ground (dys 2) solids (food from home permitted) and can also have teaspoons of thin soup. SLP will f/u, likely will be able to uprgrade to thin with further training for small sips and a dry swallow to fully  clear residue.   CHL IP PHARYNGEAL PHASE 05/31/2020 Pharyngeal Phase Impaired Pharyngeal- Pudding Teaspoon -- Pharyngeal -- Pharyngeal- Pudding Cup -- Pharyngeal -- Pharyngeal- Honey Teaspoon -- Pharyngeal -- Pharyngeal- Honey Cup -- Pharyngeal -- Pharyngeal- Nectar Teaspoon -- Pharyngeal -- Pharyngeal- Nectar Cup Delayed swallow initiation-pyriform sinuses Pharyngeal -- Pharyngeal- Nectar Straw Delayed swallow initiation-pyriform sinuses Pharyngeal -- Pharyngeal-  Thin Teaspoon -- Pharyngeal -- Pharyngeal- Thin Cup Delayed swallow initiation-pyriform sinuses;Penetration/Aspiration before swallow;Penetration/Apiration after swallow Pharyngeal Material enters airway, passes BELOW cords then ejected out Pharyngeal- Thin Straw Penetration/Aspiration before swallow;Delayed swallow initiation-pyriform sinuses Pharyngeal Material enters airway, passes BELOW cords then ejected out Pharyngeal- Puree Delayed swallow initiation-vallecula Pharyngeal -- Pharyngeal- Mechanical Soft Delayed swallow initiation-vallecula Pharyngeal -- Pharyngeal- Regular -- Pharyngeal -- Pharyngeal- Multi-consistency -- Pharyngeal -- Pharyngeal- Pill -- Pharyngeal -- Pharyngeal Comment --  No flowsheet data found. DeBlois, Riley NearingBonnie Caroline 06/01/2020, 10:49 AM              ECHOCARDIOGRAM COMPLETE  Result Date: 05/30/2020    ECHOCARDIOGRAM REPORT   Patient Name:   Ann Coleman    Date of Exam: 05/30/2020 Medical Rec #:  [id number]  Height:       59.0 in Accession #:    [id number] Weight:       151.5 lb Date of Birth:  11/23/1924   BSA:          1.639 m Patient Age:    89 years   BP:           136/101 mmHg Patient Gender: F          HR:           75 bpm. Exam Location:  Inpatient Procedure: 2D Echo, Cardiac Doppler and Color Doppler Indications:    Stroke 434.91 / I163.9  History:        Patient has prior history of Echocardiogram examinations, most                 recent 03/06/2017. Risk Factors:Hypertension and Dyslipidemia.  Sonographer:    Elmarie Shileyiffany Dance Referring Phys: [id number] VASUNDHRA RATHORE IMPRESSIONS  1. Intracavitary gradient. Peak velocity 2.1 m/s. Peak gradient 17.6 mmHg. Left ventricular ejection fraction, by estimation, is >75%. The left ventricle has hyperdynamic function. The left ventricle has no regional wall motion abnormalities. There is mild concentric left ventricular hypertrophy. Left ventricular diastolic parameters are consistent with Grade I diastolic dysfunction (impaired relaxation).  2.  Right ventricular systolic function is normal. The right ventricular size is normal. There is normal pulmonary artery systolic pressure.  3. Left atrial size was mildly dilated.  4. The mitral valve is normal in structure. No evidence of mitral valve regurgitation. No evidence of mitral stenosis.  5. The aortic valve is tricuspid. Aortic valve regurgitation is mild. No aortic stenosis is present.  6. The inferior vena cava is normal in size with greater than 50% respiratory variability, suggesting right atrial pressure of 3 mmHg. FINDINGS  Left Ventricle: Intracavitary gradient. Peak velocity 2.1 m/s. Peak gradient 17.6 mmHg. Left ventricular ejection fraction, by estimation, is >75%. The left ventricle has hyperdynamic function. The left ventricle has no regional wall motion abnormalities. The left ventricular internal cavity size was normal in size. There is mild concentric left ventricular hypertrophy. Left ventricular diastolic parameters are consistent with Grade I diastolic dysfunction (impaired relaxation). Right Ventricle: The right ventricular size is normal. No increase in right ventricular wall thickness. Right ventricular systolic function is normal. There is normal pulmonary artery systolic pressure. The tricuspid  regurgitant velocity is 1.67 m/s, and  with an assumed right atrial pressure of 3 mmHg, the estimated right ventricular systolic pressure is 14.2 mmHg. Left Atrium: Left atrial size was mildly dilated. Right Atrium: Right atrial size was normal in size. Pericardium: A small pericardial effusion is present. There is no evidence of cardiac tamponade. Mitral Valve: The mitral valve is normal in structure. Normal mobility of the mitral valve leaflets. No evidence of mitral valve regurgitation. No evidence of mitral valve stenosis. Tricuspid Valve: The tricuspid valve is normal in structure. Tricuspid valve regurgitation is trivial. No evidence of tricuspid stenosis. Aortic Valve: The aortic valve  is tricuspid. Aortic valve regurgitation is mild. Aortic regurgitation PHT measures 300 msec. No aortic stenosis is present. Pulmonic Valve: The pulmonic valve was normal in structure. Pulmonic valve regurgitation is trivial. No evidence of pulmonic stenosis. Aorta: The aortic root is normal in size and structure. Venous: The inferior vena cava is normal in size with greater than 50% respiratory variability, suggesting right atrial pressure of 3 mmHg. IAS/Shunts: No atrial level shunt detected by color flow Doppler.  LEFT VENTRICLE PLAX 2D LVIDd:         3.21 cm LVIDs:         2.24 cm LV PW:         1.26 cm LV IVS:        1.20 cm LVOT diam:     2.00 cm LV SV:         75 LV SV Index:   46 LVOT Area:     3.14 cm  RIGHT VENTRICLE            IVC RV Basal diam:  2.16 cm    IVC diam: 1.53 cm RV S prime:     9.03 cm/s TAPSE (M-mode): 1.9 cm LEFT ATRIUM             Index       RIGHT ATRIUM           Index LA diam:        4.20 cm 2.56 cm/m  RA Area:     12.50 cm LA Vol (A2C):   32.7 ml 19.95 ml/m RA Volume:   27.70 ml  16.90 ml/m LA Vol (A4C):   50.2 ml 30.63 ml/m LA Biplane Vol: 41.9 ml 25.57 ml/m  AORTIC VALVE LVOT Vmax:   101.00 cm/s LVOT Vmean:  75.100 cm/s LVOT VTI:    0.238 m AI PHT:      300 msec  AORTA Ao Root diam: 2.90 cm Ao Asc diam:  3.50 cm MITRAL VALVE                TRICUSPID VALVE MV Area (PHT): 2.50 cm     TR Peak grad:   11.2 mmHg MV Decel Time: 303 msec     TR Vmax:        167.00 cm/s MV E velocity: 69.20 cm/s MV A velocity: 127.00 cm/s  SHUNTS MV E/A ratio:  0.54         Systemic VTI:  0.24 m                             Systemic Diam: 2.00 cm Chilton Si MD Electronically signed by Chilton Si MD Signature Date/Time: 05/30/2020/12:19:32 PM    Final    CT HEAD CODE STROKE WO CONTRAST  Result Date: 05/29/2020 CLINICAL DATA:  Code stroke. Initial evaluation for  acute slurred speech, leg weakness. EXAM: CT HEAD WITHOUT CONTRAST TECHNIQUE: Contiguous axial images were obtained from the  base of the skull through the vertex without intravenous contrast. COMPARISON:  Prior study from 03/04/2017. FINDINGS: Brain: Generalized age-related cerebral atrophy with moderate chronic microvascular ischemic disease. Scattered remote lacunar infarcts present about the bilateral basal ganglia and right internal capsule/thalamus. No acute intracranial hemorrhage. No acute large vessel territory infarct. No parenchymal mass lesion, mass effect, or midline shift. No hydrocephalus or extra-axial fluid collection. There is a hyperdense ovoid lesion measuring approximately 2.5 cm positioned within the right frontal calvarium (series 3, image 18). Upon review of prior CT from 2018, this lesion was likely present, although faintly visible at that time. Associated lytic changes within the underlying calvarium with probable associated pathologic fracture (series 4, image 46). Soft tissue component extends into the overlying right frontal scalp as well. Vascular: No hyperdense vessel. Calcified atherosclerosis present at the skull base. Skull: 2.5 cm lytic lesion involving the right frontal calvarium as above. Calvarium otherwise intact. No other scalp soft tissue abnormality. Sinuses/Orbits: Globes and orbital soft tissues within normal limits. Scattered mucosal thickening noted within the visualized ethmoidal air cells. Visualized paranasal sinuses are otherwise clear. No mastoid effusion. Other: None. ASPECTS [facility] Stroke Program Early CT Score) - Ganglionic level infarction (caudate, lentiform nuclei, internal capsule, insula, M1-M3 cortex): 7 - Supraganglionic infarction (M4-M6 cortex): 3 Total score (0-10 with 10 being normal): 10 IMPRESSION: 1. No acute intracranial infarct or other abnormality. 2. ASPECTS is 10. 3. Approximate 2.5 cm lytic lesion involving the right frontal calvarium with probable associated pathologic fracture. Finding is nonspecific, although is suspected to have been present on previous CT  from 2018, although only faintly visible at that time. Primary differential consideration consists of an intra osseous meningioma, although possible osseous metastasis not excluded. Further evaluation with dedicated brain MRI, with and without contrast suggested for further evaluation. Additionally, neuro surgical consultation and referral for possible histologic sampling suggested. 4. Underlying age-related cerebral atrophy with moderate chronic microvascular ischemic disease, with multiple remote lacunar infarcts about the bilateral basal ganglia as above. Critical Value/emergent results were called by telephone at the time of interpretation on 05/29/2020 at 10:32 pm to provider [facility] , who verbally acknowledged these results. Electronically Signed   By: Rise Mu M.D.   On: 05/29/2020 22:36    Subjective: Without complaints  Discharge Exam: Vitals:   06/02/20 0801 06/02/20 1143  BP: (!) 166/82 124/66  Pulse:    Resp:    Temp: 98 F (36.7 C) 98.2 F (36.8 C)  SpO2:     Vitals:   06/02/20 0127 06/02/20 0417 06/02/20 0801 06/02/20 1143  BP:  134/90 (!) 166/82 124/66  Pulse: 64 81    Resp: 15 19    Temp:  98.5 F (36.9 C) 98 F (36.7 C) 98.2 F (36.8 C)  TempSrc:  Oral Oral   SpO2: (!) 83% 98%    Weight:      Height:        General: Pt is alert, awake, not in acute distress Cardiovascular: RRR, S1/S2 +, no rubs, no gallops Respiratory: CTA bilaterally, no wheezing, no rhonchi Abdominal: Soft, NT, ND, bowel sounds + Extremities: no edema, no cyanosis   The results of significant diagnostics from this hospitalization (including imaging, microbiology, ancillary and laboratory) are listed below for reference.     Microbiology: Recent Results (from the past 240 hour(s))  SARS Coronavirus 2 by RT PCR (hospital order,  performed in [facility] hospital lab) Nasopharyngeal Nasopharyngeal Swab     Status: None   Collection Time: 05/29/20 10:55 PM   Specimen:  Nasopharyngeal Swab  Result Value Ref Range Status   SARS Coronavirus 2 NEGATIVE NEGATIVE Final    Comment: (NOTE) SARS-CoV-2 target nucleic acids are NOT DETECTED.  The SARS-CoV-2 RNA is generally detectable in upper and lower respiratory specimens during the acute phase of infection. The lowest concentration of SARS-CoV-2 viral copies this assay can detect is 250 copies / mL. A negative result does not preclude SARS-CoV-2 infection and should not be used as the sole basis for treatment or other patient management decisions.  A negative result may occur with improper specimen collection / handling, submission of specimen other than nasopharyngeal swab, presence of viral mutation(s) within the areas targeted by this assay, and inadequate number of viral copies (<250 copies / mL). A negative result must be combined with clinical observations, patient history, and epidemiological information.  Fact Sheet for Patients:   BoilerBrush.com.cy  Fact Sheet for Healthcare Providers: https://pope.com/  This test is not yet approved or  cleared by the [place] FDA and has been authorized for detection and/or diagnosis of SARS-CoV-2 by FDA under an Emergency Use Authorization (EUA).  This EUA will remain in effect (meaning this test can be used) for the duration of the COVID-19 declaration under Section 564(b)(1) of the Act, 21 U.S.C. section 360bbb-3(b)(1), unless the authorization is terminated or revoked sooner.  Performed at [facility], [address], [place], [place] [address]      Labs: BNP (last 3 results) No results for input(s): BNP in the last 8760 hours. Basic Metabolic Panel: Recent Labs  Lab 05/29/20 2143 05/29/20 2150  NA 140 137  K 3.5 3.5  CL 101 103  CO2  --  24  GLUCOSE 95 100*  BUN 11 5*  CREATININE 0.90 1.03*  CALCIUM  --  8.5*   Liver Function Tests: Recent Labs  Lab 05/29/20 2150  AST 35   ALT 27  ALKPHOS 72  BILITOT 0.8  PROT 7.2  ALBUMIN 3.2*   No results for input(s): LIPASE, AMYLASE in the last 168 hours. No results for input(s): AMMONIA in the last 168 hours. CBC: Recent Labs  Lab 05/29/20 2143 05/29/20 2150  WBC  --  7.0  NEUTROABS  --  3.0  HGB 13.3 12.6  HCT 39.0 40.8  MCV  --  83.8  PLT  --  212   Cardiac Enzymes: No results for input(s): CKTOTAL, CKMB, CKMBINDEX, TROPONINI in the last 168 hours. BNP: Invalid input(s): POCBNP CBG: No results for input(s): GLUCAP in the last 168 hours. D-Dimer No results for input(s): DDIMER in the last 72 hours. Hgb A1c No results for input(s): HGBA1C in the last 72 hours. Lipid Profile No results for input(s): CHOL, HDL, LDLCALC, TRIG, CHOLHDL, LDLDIRECT in the last 72 hours. Thyroid function studies No results for input(s): TSH, T4TOTAL, T3FREE, THYROIDAB in the last 72 hours.  Invalid input(s): FREET3 Anemia work up No results for input(s): VITAMINB12, FOLATE, FERRITIN, TIBC, IRON, RETICCTPCT in the last 72 hours. Urinalysis    Component Value Date/Time   COLORURINE STRAW (A) 03/04/2017 1230   APPEARANCEUR CLEAR 03/04/2017 1230   LABSPEC 1.005 03/04/2017 1230   PHURINE 7.0 03/04/2017 1230   GLUCOSEU NEGATIVE 03/04/2017 1230   HGBUR NEGATIVE 03/04/2017 1230   BILIRUBINUR NEGATIVE 03/04/2017 1230   KETONESUR NEGATIVE 03/04/2017 1230   PROTEINUR NEGATIVE 03/04/2017 1230  NITRITE NEGATIVE 03/04/2017 1230   LEUKOCYTESUR NEGATIVE 03/04/2017 1230   Sepsis Labs Invalid input(s): PROCALCITONIN,  WBC,  LACTICIDVEN Microbiology Recent Results (from the past 240 hour(s))  SARS Coronavirus 2 by RT PCR (hospital order, performed in [facility] hospital lab) Nasopharyngeal Nasopharyngeal Swab     Status: None   Collection Time: 05/29/20 10:55 PM   Specimen: Nasopharyngeal Swab  Result Value Ref Range Status   SARS Coronavirus 2 NEGATIVE NEGATIVE Final    Comment: (NOTE) SARS-CoV-2 target nucleic acids are  NOT DETECTED.  The SARS-CoV-2 RNA is generally detectable in upper and lower respiratory specimens during the acute phase of infection. The lowest concentration of SARS-CoV-2 viral copies this assay can detect is 250 copies / mL. A negative result does not preclude SARS-CoV-2 infection and should not be used as the sole basis for treatment or other patient management decisions.  A negative result may occur with improper specimen collection / handling, submission of specimen other than nasopharyngeal swab, presence of viral mutation(s) within the areas targeted by this assay, and inadequate number of viral copies (<250 copies / mL). A negative result must be combined with clinical observations, patient history, and epidemiological information.  Fact Sheet for Patients:   BoilerBrush.com.cy  Fact Sheet for Healthcare Providers: https://pope.com/  This test is not yet approved or  cleared by the [place] FDA and has been authorized for detection and/or diagnosis of SARS-CoV-2 by FDA under an Emergency Use Authorization (EUA).  This EUA will remain in effect (meaning this test can be used) for the duration of the COVID-19 declaration under Section 564(b)(1) of the Act, 21 U.S.C. section 360bbb-3(b)(1), unless the authorization is terminated or revoked sooner.  Performed at [facility], [address], [place], [place] [address]    Time spent: 30 min  SIGNED:   Rickey Barbara, MD  [facility] 06/02/2020, 4:14 PM  If 7PM-7AM, please contact night-coverage

## 2020-06-02 NOTE — Plan of Care (Signed)
  Problem: Education: Goal: Knowledge of disease or condition will improve Outcome: Progressing   Problem: Education: Goal: Knowledge of General Education information will improve Description: Including pain rating scale, medication(s)/side effects and non-pharmacologic comfort measures Outcome: Progressing  Discussed aspiration precautions with family.  Problem: Activity: Goal: Risk for activity intolerance will decrease Outcome: Progressing  Patient with no observed dyspnea when turning in bed.

## 2020-06-02 NOTE — TOC Progression Note (Signed)
Transition of Care [facility]) - Progression Note    Patient Details  Name: Ann Coleman MRN: [id number] Date of Birth: [birth year]  Transition of [facility]) CM/SW Contact  Nonda Lou, [place] Phone Number: 06/02/2020, 1:38 PM  Clinical Narrative:     CSW contacted CIR to inquire on patient admitting today. CSW was informed the MD is reviewing for readiness. CSW will continue to follow and assist.        Expected Discharge Plan and Services                                                 Social Determinants of Health (SDOH) Interventions    Readmission Risk Interventions No flowsheet data found.

## 2020-06-02 NOTE — Progress Notes (Signed)
Ann Blake, MD  Physician  Physical Medicine and Rehabilitation  PMR Pre-admission      Signed  Date of Service:  05/31/2020  6:20 PM      Related encounter: ED to Hosp-Admission (Discharged) from 05/29/2020 in [facility]      Signed       Show:Clear all '[x]' Manual'[x]' Template'[x]' Copied  Added by: '[x]' Raechel Ache, OT  '[]' Hover for details PMR Admission Coordinator Pre-Admission Assessment   Patient: Ann Coleman is an 86 y.o., female MRN: [id number] DOB: 15-Jan-1924 Height: '4\' 11"'  (149.9 cm) Weight: 68.7 kg                                                                                                                                                  Insurance Information HMO:     PPO:      PCP:     IPA:      80/20: yes     OTHER:  PRIMARY: Medicare A and B      Policy#: [id number]      Subscriber: patient CM Name:       Phone#:      Fax#:  Pre-Cert#:       Employer:  Benefits:  Phone #: verified eligibility online      Name: [employer]. Date: Part A effective 12/02/1991; Part B effective 12/01/1990     Deduct: $1,484      Out of Pocket Max: NA      Life Max: NA  CIR: Covered per Medicare guidelines once yearly deductible has been met       SNF: days 1-20, 100%; days 21-100, 80%.  Outpatient: 80%     Co-Pay: 20% Home Health: 100%      Co-Pay:  DME: 80%     Co-Pay: 20% Providers: Pt's choice SECONDARY: Medicaid [place]       Policy#: [id number]      Phone#: verified online via [employer] on 06/01/20 Coverage code: [facility]    Financial Counselor:       Phone#:    The "Data Collection Information Summary" for patients in Inpatient Rehabilitation Facilities with attached "Privacy Act [place] Records" was provided and verbally reviewed with: Family   Emergency Contact Information         Contact Information     Name Relation Home Work Mobile    Chhann,[name] Daughter [phone]           Current Medical History  Patient Admitting Diagnosis:  Acute left pontine stroke   History of Present Illness: Ann Coleman is an 86 year old right-handed non-English-speaking [place] female with history of hypertension and hyperlipidemia as well as CVA 3 years ago.  Per chart review lives with her children.  1 level home 2 steps to entry.  Reportedly independent prior to admission using a walker and assistance from her daughter.  Presented 05/29/2020 with left-sided weakness and slurred speech.  Noted systolic blood pressure in the 190s.  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  In retrospect of this lesion appeared to have been present on her prior MRI from 2018.  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  This did appear chronic and again noted on MRI 2018.  Echocardiogram with ejection fraction of 75% no wall motion abnormalities. MRI of the brain was again completed 05/31/2020 for follow-up showing mild increase in size of small acute infarct of the left parasagittal pons. Admission chemistries BUN 5 creatinine 1.03, SARS Covid is negative.  She is on a dysphagia #2 nectar thick liquid diet.  Currently maintained on aspirin and Plavix for CVA prophylaxis x3 weeks and aspirin alone.  Subcutaneous Lovenox for DVT prophylaxis.  Therapy evaluations completed with recommendations for CIR.  Patient is to be admitted for a comprehensive rehab program on 06/02/20.   Complete NIHSS TOTAL: 9 Glasgow Coma Scale Score: 15   Past Medical History      Past Medical History:  Diagnosis Date  . High cholesterol    . Hypertension    . Stroke [facility])        Family History  family history is not on file.   Prior Rehab/Hospitalizations:  Has the patient had prior rehab or hospitalizations prior to admission? No   Has  the patient had major surgery during 100 days prior to admission? No   Current Medications    Current Facility-Administered Medications:  .  acetaminophen (TYLENOL) tablet 650 mg, 650 mg, Oral, Q4H PRN, 650 mg at 05/30/20 0322 **OR** acetaminophen (TYLENOL) 160 MG/5ML solution 650 mg, 650 mg, Per Tube, Q4H PRN **OR** acetaminophen (TYLENOL) suppository 650 mg, 650 mg, Rectal, Q4H PRN, [name], MD .  aspirin EC tablet 81 mg, 81 mg, Oral, Daily, [name], MD, 81 mg at 06/01/20 1100 .  atorvastatin (LIPITOR) tablet 40 mg, 40 mg, Oral, Daily, [name], MD, 40 mg at 06/01/20 1100 .  clopidogrel (PLAVIX) tablet 75 mg, 75 mg, Oral, Daily, [name], MD, 75 mg at 06/01/20 1100 .  enoxaparin (LOVENOX) injection 30 mg, 30 mg, Subcutaneous, Q24H, [name], MD, 30 mg at 06/01/20 1100 .  Resource ThickenUp Clear, , Oral, PRN, Donne Hazel, MD   Patients Current Diet:     Diet Order                      DIET DYS 2 Room service appropriate? Yes; Fluid consistency: Nectar Thick  Diet effective now                      Precautions / Restrictions Precautions Precautions: Fall Restrictions Weight Bearing Restrictions: No    Has the patient had 2 or more falls or a fall with injury in the past year?No   Prior Activity Level Community (5-7x/wk): was very active according to her daughter; only used [facility] for ambulation; she was not working or driving and did require some assistance for stair negotiation   Prior Functional Level Prior Function Comments: Limited information due to communication barrier. Pt reports she did not need DME to ambulate and lived with daughter   Self Care: Did the patient need help bathing,  dressing, using the toilet or eating?  Independent   Indoor Mobility: Did the patient need assistance with walking from room to room (with or without device)? Independent   Stairs: Did the patient need assistance with internal or external stairs  (with or without device)? Needed some help   Functional Cognition: Did the patient need help planning regular tasks such as shopping or remembering to take medications? Independent   Home Assistive Devices / Equipment Home Assistive Devices/Equipment: Cane (specify quad or straight) Home Equipment: Walker - 4 wheels, Hand held shower head   Prior Device Use: Indicate devices/aids used by the patient prior to current illness, exacerbation or injury? single point cane   Current Functional Level Cognition   Arousal/Alertness: Awake/alert Overall Cognitive Status: Difficult to assess Difficult to assess due to: Non-English speaking Orientation Level: Oriented to person, Oriented to place General Comments: following simple commands with interpreter Attention: Focused, Sustained Focused Attention: Appears intact Sustained Attention: Impaired Sustained Attention Impairment: Verbal basic, Functional basic Awareness: Impaired Awareness Impairment: Emergent impairment Problem Solving: Impaired Problem Solving Impairment: Verbal basic, Functional basic    Extremity Assessment (includes Sensation/Coordination)   Upper Extremity Assessment: RUE deficits/detail RUE Deficits / Details: 0/5 grip, 2/5 elbow and shoulder  Lower Extremity Assessment: RLE deficits/detail RLE Deficits / Details: 0/5 ankle, 2/5 knee and hip     ADLs   Overall ADL's : Needs assistance/impaired Eating/Feeding: NPO Grooming: Set up, Sitting, Min guard Grooming Details (indicate cue type and reason): pt able to bring hand to face in sitting with min guard for balance Upper Body Bathing: Moderate assistance, Sitting, Minimal assistance Upper Body Bathing Details (indicate cue type and reason): Min-Mod A with sitting balance Lower Body Bathing: Total assistance, +2 for physical assistance, +2 for safety/equipment, Sitting/lateral leans Lower Body Bathing Details (indicate cue type and reason): Pt refusing and saying she  could not do LB ADLs Upper Body Dressing : Minimal assistance, Moderate assistance, Sitting Upper Body Dressing Details (indicate cue type and reason): Min-Mod A for sitting balance Lower Body Dressing: Total assistance, +2 for physical assistance, +2 for safety/equipment, Sit to/from stand Lower Body Dressing Details (indicate cue type and reason): Pt stating she could not do LB ADLs Toilet Transfer Details (indicate cue type and reason): deferred due to safety need to futher assess Toileting - Clothing Manipulation Details (indicate cue type and reason): deferred due to safety need to futher assess Tub/Shower Transfer Details (indicate cue type and reason): deferred due to safety need to futher assess Functional mobility during ADLs: +2 for physical assistance, +2 for safety/equipment, Moderate assistance General ADL Comments: Mod A +2 for bed mobility. Limited eval due to language barrier and pt participation     Mobility   Overal bed mobility: Needs Assistance Bed Mobility: Rolling, Supine to Sit, Sit to Supine Rolling: Min assist, Mod assist Supine to sit: Max assist, HOB elevated Sit to supine: Max assist General bed mobility comments: Cues for sequencing, assist with BLE and trunk, increased time. Rolling R min assist. Rolling L mod assist.     Transfers   Overall transfer level: Needs assistance Transfer via Lift Equipment: Stedy Transfers: Sit to/from Stand Sit to Stand: +2 physical assistance, Max assist General transfer comment: use of bed pads to lift hips. Pt able to assist by pulling up with LUE. Good effort noted. Dependent transfer OOB with stedy.     Ambulation / Gait / Stairs / Wheelchair Mobility   Ambulation/Gait General Gait Details: unable     Posture /  Balance Dynamic Sitting Balance Sitting balance - Comments: mod assist to maintain balance EOB Balance Overall balance assessment: Needs assistance Sitting-balance support: Single extremity supported, Feet  supported Sitting balance-Leahy Scale: Poor Sitting balance - Comments: mod assist to maintain balance EOB Postural control: Posterior lean     Special needs/care consideration  Special service needs: [place] Speaking     and Designated visitor: Daughter Jeff Davis (from acute therapy documentation) Living Arrangements: Children  Lives With: Daughter Type of Home: House Home Layout: One level Home Access: Stairs to enter Entrance Stairs-Rails: None Technical brewer of Steps: 2 Bathroom Shower/Tub: Chiropodist: Standard Home Care Services: No Additional Comments: limited in questions due to communication barriers. Some above information was taken from prior admission but pt unable to confirm if it is still correct.    Discharge Living Setting Plans for Discharge Living Setting: House, Lives with (comment) (lives with daughter, son-in-law, and grandchildren. ) Type of Home at Discharge: House Discharge Home Layout: One level Discharge Home Access: Stairs to enter Entrance Stairs-Rails: None Entrance Stairs-Number of Steps: 3 Discharge Bathroom Shower/Tub: Tub/shower unit Discharge Bathroom Toilet: Standard Discharge Bathroom Accessibility: Yes How Accessible: Accessible via walker Does the patient have any problems obtaining your medications?: No   Social/Family/Support Systems Patient Roles: Other (Comment) (has close family support) Contact Information: daughter Marti Sleigh): [phone] (speaks [place]) Anticipated Caregiver: [name] + son-in law Anticipated Ambulance person Information: see above ([name] works nights and her husband works days so someone can be there 24/7 for assist) Ability/Limitations of Caregiver: Mod A Caregiver Availability: 24/7 Discharge Plan Discussed with Primary Caregiver: Yes ([name]) Is Caregiver In Agreement with Plan?: Yes Does Caregiver/Family have Issues with Lodging/Transportation while Pt is in  Rehab?: No     Goals Patient/Family Goal for Rehab: PT/OT: Mod A; SLP: Min A Expected length of stay: 10-14 days Additional Information: Will need Camb[place]ian intrepreter  Pt/Family Agrees to Admission and willing to participate: Yes Program Orientation Provided & Reviewed with Pt/Caregiver Including Roles  & Responsibilities: Yes (pt and [name])  Barriers to Discharge: Home environment access/layout  Barriers to Discharge Comments: steps to enter home.      Decrease burden of Care through IP rehab admission: NA     Possible need for SNF placement upon discharge: Not anticipated; pt has good family support at DC and they understand the goal is to go home at a Moderate Assistance level (they have an understanding of what that would entail physically).      Patient Condition: This patient's medical and functional status has changed since the consult dated: 05/31/20 in which the Rehabilitation Physician determined and documented that the patient's condition is appropriate for intensive rehabilitative care in an inpatient rehabilitation facility. Medical changes are: see HPI for details.  Functional changes are: improvement in bed mobility from Mod A + 2 to Max A, and pt has now demonstrated an ability to transfer with Max A +2 and use of stedy . After evaluating the patient today and speaking with the Rehabilitation physician and acute team, the patient remains appropriate for inpatient rehab. Will admit to inpatient rehab tomorrow, once bed available on 06/02/20.   Preadmission Screen Completed By:  Raechel Ache, OT, 06/01/2020 5:19 PM ______________________________________________________________________   Discussed status with Dr. Letta Pate on 06/01/20 at 3:30PM and received approval for admission today.   Admission Coordinator:  Raechel Ache, time 3:30PM/Date 06/01/20.  Revision History                     Note Details  Author Ann Blake, MD File Time 06/02/2020  2:06 PM    Author Type Physician Status Signed  Last Editor Ann Blake, MD Service Physical Medicine and [place] # [id number] [place] Date 06/02/2020

## 2020-06-02 NOTE — H&P (Signed)
Physical Medicine and Rehabilitation Admission H&P       HPI: Ann Coleman is a 86 year old right-handed non-English-speaking [name] female with history of hypertension and hyperlipidemia as well as CVA 3 years ago.  Per chart review lives with her children.  1 level home 2 steps to entry.  Reportedly independent prior to admission using a walker and assistance from her daughter.  Presented 05/29/2020 with left-sided weakness and slurred speech.  Noted systolic blood pressure in the 190s.  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  In retrospect of this lesion appeared to have been present on her prior MRI from 2018.  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  This did appear chronic and again noted on MRI 2018.  Echocardiogram with ejection fraction of 75% no wall motion abnormalities. MRI of the brain was again completed 05/31/2020 due to increased left-sided weakness showing mild increase in size of small acute infarct of the left parasagittal pons. Admission chemistries BUN 5 creatinine 1.03, SARS Covid is negative.  She is on a dysphagia #2 nectar thick liquid diet.  Currently maintained on aspirin and Plavix for CVA prophylaxis x3 weeks and aspirin alone.  Subcutaneous Lovenox for DVT prophylaxis.  Therapy evaluations completed with recommendations of physical medicine rehab consult.  Patient was admitted for a comprehensive rehab program.   Review of Systems  Constitutional: Negative for chills and fever.  HENT: Negative for hearing loss.   Eyes: Negative for blurred vision and double vision.  Respiratory: Negative for cough and shortness of breath.   Cardiovascular: Negative for chest pain and leg swelling.  Gastrointestinal: Positive for  constipation. Negative for heartburn, nausea and vomiting.  Genitourinary: Negative for dysuria and hematuria.  Musculoskeletal: Positive for myalgias.  Skin: Negative for rash.  Neurological: Positive for speech change and weakness.  All other systems reviewed and are negative. Translation assistance was provided by family.       Past Medical History:  Diagnosis Date  . High cholesterol    . Hypertension    . Stroke [facility])           Past Surgical History:  Procedure Laterality Date  . NO PAST SURGERIES             Family History  Problem Relation Age of Onset  . Stroke Neg Hx    . Diabetes Mellitus II Neg Hx    . Hypertension Neg Hx      Social History:  reports that she has never smoked. She has never used smokeless tobacco. She reports that she does not drink alcohol and does not use drugs. Allergies: No Known Allergies       Medications Prior to Admission  Medication Sig Dispense Refill  . acetaminophen (TYLENOL) 500 MG tablet Take 500 mg by mouth every 6 (six) hours as needed for moderate pain.      Marland Kitchen amLODipine (NORVASC) 5 MG tablet Take 1 tablet (5 mg total) by mouth daily. (Patient not taking: Reported on 05/29/2020) 30 tablet 2  . aspirin 325 MG tablet Take 1 tablet (325 mg total) by mouth daily. (Patient not taking: Reported on 05/29/2020)      . hydrochlorothiazide (MICROZIDE) 12.5 MG capsule Take 1 capsule (12.5 mg total) by mouth daily. (Patient not taking: Reported on  05/29/2020) 30 capsule 0  . simvastatin (ZOCOR) 10 MG tablet Take 1 tablet (10 mg total) by mouth daily at 6 PM. (Patient not taking: Reported on 05/29/2020) 30 tablet 0      Drug Regimen Review Drug regimen was reviewed and remains appropriate with no significant issues identified   Home: Home Living Family/patient expects to be discharged to:: Private residence Living Arrangements: Children Type of Home: House Home Access: Stairs to enter Secretary/administrator of Steps: 2 Entrance  Stairs-Rails: None Home Layout: One level Bathroom Shower/Tub: Engineer, manufacturing systems: Standard Home Equipment: Environmental consultant - 4 wheels, Hand held shower head Additional Comments: limited in questions due to communication barriers. Some above information was taken from prior admission but pt unable to confirm if it is still correct.   Lives With: Daughter   Functional History: Prior Function Comments: Limited information due to communication barrier. Pt reports she did not need DME to ambulate and lived with daughter   Functional Status:  Mobility: Bed Mobility Overal bed mobility: Needs Assistance Bed Mobility: Rolling, Supine to Sit, Sit to Supine Rolling: Mod assist, +2 for physical assistance, +2 for safety/equipment Supine to sit: Mod assist, +2 for physical assistance, +2 for safety/equipment Sit to supine: Mod assist, +2 for physical assistance, +2 for safety/equipment General bed mobility comments: Mod A+2 for bed mobility due to language barrier and for pt safety Transfers General transfer comment: deferred due to safety   ADL: ADL Overall ADL's : Needs assistance/impaired Eating/Feeding: NPO Grooming: Set up, Sitting, Min guard Grooming Details (indicate cue type and reason): pt able to bring hand to face in sitting with min guard for balance Upper Body Bathing: Moderate assistance, Sitting, Minimal assistance Upper Body Bathing Details (indicate cue type and reason): Min-Mod A with sitting balance Lower Body Bathing: Total assistance, +2 for physical assistance, +2 for safety/equipment, Sitting/lateral leans Lower Body Bathing Details (indicate cue type and reason): Pt refusing and saying she could not do LB ADLs Upper Body Dressing : Minimal assistance, Moderate assistance, Sitting Upper Body Dressing Details (indicate cue type and reason): Min-Mod A for sitting balance Lower Body Dressing: Total assistance, +2 for physical assistance, +2 for safety/equipment, Sit  to/from stand Lower Body Dressing Details (indicate cue type and reason): Pt stating she could not do LB ADLs Toilet Transfer Details (indicate cue type and reason): deferred due to safety need to futher assess Toileting - Clothing Manipulation Details (indicate cue type and reason): deferred due to safety need to futher assess Tub/Shower Transfer Details (indicate cue type and reason): deferred due to safety need to futher assess Functional mobility during ADLs: +2 for physical assistance, +2 for safety/equipment, Moderate assistance General ADL Comments: Mod A +2 for bed mobility. Limited eval due to language barrier and pt participation   Cognition: Cognition Overall Cognitive Status: No family/caregiver present to determine baseline cognitive functioning Arousal/Alertness: Awake/alert Orientation Level: Oriented to person Attention: Focused, Sustained Focused Attention: Appears intact Sustained Attention: Impaired Sustained Attention Impairment: Verbal basic, Functional basic Awareness: Impaired Awareness Impairment: Emergent impairment Problem Solving: Impaired Problem Solving Impairment: Verbal basic, Functional basic Cognition Arousal/Alertness: Awake/alert Behavior During Therapy: Flat affect Overall Cognitive Status: No family/caregiver present to determine baseline cognitive functioning General Comments: Pt inconsistent with responding to questions and following commands   Physical Exam: Blood pressure 139/80, pulse 81, temperature 98.2 F (36.8 C), temperature source Oral, resp. rate 20, height  (1.499 m), weight 68.7 kg, SpO2 97 %. Physical Exam Neurological:     Comments:  Patient is alert in no acute distress.  Follows simple demonstrated commands.  Family at bedside to help interpret as patient is able to provide name and age.  Speech is a bit dysarthric but intelligible as per family.     General: No acute distress Mood and affect are appropriate Heart:  Regular rate and rhythm no rubs murmurs or extra sounds, right parasternal tenderness to palpation Lungs: Clear to auscultation, breathing unlabored, no rales or wheezes Abdomen: Positive bowel sounds, soft nontender to palpation, nondistended Extremities: No clubbing, cyanosis, or edema Skin: No evidence of breakdown, no evidence of rash Neurologic: Cranial nerves II through XII intact, motor strength is 4/5 in  Left nad 0/5 right deltoid, bicep, tricep, grip, hip flexor, knee extensors, ankle dorsiflexor and plantar flexor Sensory exam normal sensation to pinch  Musculoskeletal: Full range of motion in all 4 extremities. No joint swelling    Lab Results Last 48 Hours        Results for orders placed or performed during the hospital encounter of 05/29/20 (from the past 48 hour(s))  I-stat chem 8, ed     Status: Abnormal    Collection Time: 05/29/20  9:43 PM  Result Value Ref Range    Sodium 140 135 - 145 mmol/L    Potassium 3.5 3.5 - 5.1 mmol/L    Chloride 101 98 - 111 mmol/L    BUN 11 8 - 23 mg/dL    Creatinine, Ser 1.61 0.44 - 1.00 mg/dL    Glucose, Bld 95 70 - 99 mg/dL      Comment: Glucose reference range applies only to samples taken after fasting for at least 8 hours.    Calcium, Ion 1.10 (L) 1.15 - 1.40 mmol/L    TCO2 26 22 - 32 mmol/L    Hemoglobin 13.3 12.0 - 15.0 g/dL    HCT 09.6 36 - 46 %  Protime-INR     Status: None    Collection Time: 05/29/20  9:50 PM  Result Value Ref Range    Prothrombin Time 13.7 11.4 - 15.2 seconds    INR 1.1 0.8 - 1.2      Comment: (NOTE) INR goal varies based on device and disease states. Performed at [facility], [address] [place], [place] [address]    APTT     Status: Abnormal    Collection Time: 05/29/20  9:50 PM  Result Value Ref Range    aPTT 46 (H) 24 - 36 seconds      Comment:        IF BASELINE aPTT IS ELEVATED, SUGGEST PATIENT RISK ASSESSMENT BE USED TO DETERMINE APPROPRIATE ANTICOAGULANT THERAPY. Performed at  [facility], [address] [place], [place] [address]    CBC     Status: Abnormal    Collection Time: 05/29/20  9:50 PM  Result Value Ref Range    WBC 7.0 4.0 - 10.5 K/uL    RBC 4.87 3.87 - 5.11 MIL/uL    Hemoglobin 12.6 12.0 - 15.0 g/dL    HCT 14.7 36 - 46 %    MCV 83.8 80.0 - 100.0 fL    MCH 25.9 (L) 26.0 - 34.0 pg    MCHC 30.9 30.0 - 36.0 g/dL    RDW 82.9 56.2 - 13.0 %    Platelets 212 150 - 400 K/uL    nRBC 0.0 0.0 - 0.2 %      Comment: Performed at [facility], [address], [place], [place] [address]  Differential     Status: Abnormal    Collection Time: 05/29/20  9:50 PM  Result Value Ref Range    Neutrophils Relative % 43 %    Neutro Abs 3.0 1.7 - 7.7 K/uL    Lymphocytes Relative 38 %    Lymphs Abs 2.6 0.7 - 4.0 K/uL    Monocytes Relative 16 %    Monocytes Absolute 1.1 (H) 0 - 1 K/uL    Eosinophils Relative 2 %    Eosinophils Absolute 0.1 0 - 0 K/uL    Basophils Relative 1 %    Basophils Absolute 0.1 0 - 0 K/uL    Immature Granulocytes 0 %    Abs Immature Granulocytes 0.02 0.00 - 0.07 K/uL      Comment: Performed at [facility], [address] [place], [place] [address]  Comprehensive metabolic panel     Status: Abnormal    Collection Time: 05/29/20  9:50 PM  Result Value Ref Range    Sodium 137 135 - 145 mmol/L    Potassium 3.5 3.5 - 5.1 mmol/L    Chloride 103 98 - 111 mmol/L    CO2 24 22 - 32 mmol/L    Glucose, Bld 100 (H) 70 - 99 mg/dL      Comment: Glucose reference range applies only to samples taken after fasting for at least 8 hours.    BUN 5 (L) 8 - 23 mg/dL    Creatinine, Ser 0.98 (H) 0.44 - 1.00 mg/dL    Calcium 8.5 (L) 8.9 - 10.3 mg/dL    Total Protein 7.2 6.5 - 8.1 g/dL    Albumin 3.2 (L) 3.5 - 5.0 g/dL    AST 35 15 - 41 U/L    ALT 27 0 - 44 U/L    Alkaline Phosphatase 72 38 - 126 U/L    Total Bilirubin 0.8 0.3 - 1.2 mg/dL    GFR calc non Af Amer 46 (L) >60 mL/min    GFR calc Af Amer 54 (L) >60 mL/min    Anion gap 10 5  - 15      Comment: Performed at [facility], [address], [place], [place] [address]  SARS Coronavirus 2 by RT PCR (hospital order, performed in [facility] hospital lab) Nasopharyngeal Nasopharyngeal Swab     Status: None    Collection Time: 05/29/20 10:55 PM    Specimen: Nasopharyngeal Swab  Result Value Ref Range    SARS Coronavirus 2 NEGATIVE NEGATIVE      Comment: (NOTE) SARS-CoV-2 target nucleic acids are NOT DETECTED.   The SARS-CoV-2 RNA is generally detectable in upper and lower respiratory specimens during the acute phase of infection. The lowest concentration of SARS-CoV-2 viral copies this assay can detect is 250 copies / mL. A negative result does not preclude SARS-CoV-2 infection and should not be used as the sole basis for treatment or other patient management decisions.  A negative result may occur with improper specimen collection / handling, submission of specimen other than nasopharyngeal swab, presence of viral mutation(s) within the areas targeted by this assay, and inadequate number of viral copies (<250 copies / mL). A negative result must be combined with clinical observations, patient history, and epidemiological information.   Fact Sheet for Patients:   BoilerBrush.com.cy   Fact Sheet for Healthcare Providers: https://pope.com/   This test is not yet approved or  cleared by the [place] FDA and has been authorized for  detection and/or diagnosis of SARS-CoV-2 by FDA under an Emergency Use Authorization (EUA).  This EUA will remain in effect (meaning this test can be used) for the duration of the COVID-19 declaration under Section 564(b)(1) of the Act, 21 U.S.C. section 360bbb-3(b)(1), unless the authorization is terminated or revoked sooner.   Performed at [facility], [address] [place], [place] [address]    Hemoglobin A1c     Status: Abnormal    Collection Time: 05/30/20  4:25  AM  Result Value Ref Range    Hgb A1c MFr Bld 6.1 (H) 4.8 - 5.6 %      Comment: (NOTE)         Prediabetes: 5.7 - 6.4         Diabetes: >6.4         Glycemic control for adults with diabetes: <7.0      Mean Plasma Glucose 128 mg/dL      Comment: (NOTE) Performed At: [facility] [address] [place], [place] [id number] Jolene Schimke MD [id number]    Lipid panel     Status: Abnormal    Collection Time: 05/30/20  4:25 AM  Result Value Ref Range    Cholesterol 204 (H) 0 - 200 mg/dL    Triglycerides 076 <226 mg/dL    HDL 40 (L) >33 mg/dL    Total CHOL/HDL Ratio 5.1 RATIO    VLDL 28 0 - 40 mg/dL    LDL Cholesterol 354 (H) 0 - 99 mg/dL      Comment:        Total Cholesterol/HDL:CHD Risk Coronary Heart Disease Risk Table                     Men   Women  1/2 Average Risk   3.4   3.3  Average Risk       5.0   4.4  2 X Average Risk   9.6   7.1  3 X Average Risk  23.4   11.0        Use the calculated Patient Ratio above and the CHD Risk Table to determine the patient's CHD Risk.        ATP III CLASSIFICATION (LDL):  <100     mg/dL   Optimal  562-563  mg/dL   Near or Above                    Optimal  130-159  mg/dL   Borderline  893-734  mg/dL   High  >287     mg/dL   Very High Performed at [facility], [address] [place], [place] [address]         Imaging Results (Last 48 hours)  CT ANGIO HEAD W OR WO CONTRAST   Addendum Date: 05/30/2020   ADDENDUM REPORT: 05/30/2020 13:23 ADDENDUM: Enlarged heterogeneous thyroid gland. Largest single nodule 2 cm. recommend thyroid ultrasound (ref: J Am Coll Radiol. 2015 Feb;12(2): 143-50). Electronically Signed   By: Paulina Fusi M.D.   On: 05/30/2020 13:23    Result Date: 05/30/2020 CLINICAL DATA:  Acute presentation with slurred speech and leg weakness. Small left pontine infarction suspected by MRI. EXAM: CT ANGIOGRAPHY HEAD AND NECK TECHNIQUE: Multidetector CT imaging of the head and neck was performed using  the standard protocol during bolus administration of intravenous contrast. Multiplanar CT image reconstructions and MIPs were obtained to evaluate the vascular anatomy. Carotid stenosis measurements (when applicable) are obtained utilizing  NASCET criteria, using the distal internal carotid diameter as the denominator. CONTRAST:  75mL OMNIPAQUE IOHEXOL 350 MG/ML SOLN COMPARISON:  MR studies done yesterday. FINDINGS: CTA NECK FINDINGS Aortic arch: Aortic atherosclerosis. No aneurysm or brachiocephalic vessel origin stenosis. 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 Right carotid system: Common carotid artery is widely patent to the bifurcation region. Atherosclerotic plaque at carotid bifurcation and ICA bulb but without stenosis compared to the more distal cervical ICA diameter. Left carotid system: Common carotid artery widely patent to the bifurcation. Atherosclerotic plaque at the carotid bifurcation and ICA bulb but without stenosis compared to the more distal cervical ICA diameter. Vertebral arteries: Both vertebral artery origins are widely patent. Both vertebral arteries are widely patent through the cervical region to the foramen magnum. Skeleton: Ordinary cervical spondylosis. Right frontal calvarial mass as described at previous MRI. Other neck: No lymphadenopathy. Enlarged heterogeneous thyroid gland with multiple nodules, the largest measuring 2.1 cm. Upper chest: Negative Review of the MIP images confirms the above findings CTA HEAD FINDINGS Anterior circulation: Both internal carotid arteries are patent through the skull base and siphon regions. Ordinary siphon atherosclerotic calcification but without stenosis greater than 30%. The anterior and middle cerebral vessels are patent without correctable proximal stenosis. The anterior temporal branch of the MCA on the left appears to be occluded. Posterior circulation: Both vertebral arteries are widely patent to the  basilar. Mild atherosclerotic irregularity of the basilar artery with distal basilar narrowing of about 30%. Superior cerebellar and posterior cerebral arteries are patent. Distal PCA branch vessels show atherosclerotic irregularity. Venous sinuses: Patent and normal. Anatomic variants: None other significant. Review of the MIP images confirms the above findings IMPRESSION: Aortic atherosclerosis. 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 Atherosclerotic change at both carotid bifurcations but no stenosis. Atherosclerotic change of the carotid siphon regions but without stenosis greater than 30%. 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 This is probably chronic, similar to the MR angiography of 2018. Distal vessel atherosclerotic narrowing and irregularity of the MCA and PCA branches. Atherosclerotic irregularity of the basilar artery, maximal stenosis in the distal basilar region of approximately 30%. Right frontal calvarial lytic mass as described by MRI. Differential diagnosis is primarily metastatic disease versus myeloma versus intra osseous meningioma versus enlarging hemangioma. Electronically Signed: By: Paulina Fusi M.D. On: 05/30/2020 12:02    CT ANGIO NECK W OR WO CONTRAST   Addendum Date: 05/30/2020   ADDENDUM REPORT: 05/30/2020 13:23 ADDENDUM: Enlarged heterogeneous thyroid gland. Largest single nodule 2 cm. recommend thyroid ultrasound (ref: J Am Coll Radiol. 2015 Feb;12(2): 143-50). Electronically Signed   By: Paulina Fusi M.D.   On: 05/30/2020 13:23    Result Date: 05/30/2020 CLINICAL DATA:  Acute presentation with slurred speech and leg weakness. Small left pontine infarction suspected by MRI. EXAM: CT ANGIOGRAPHY HEAD AND NECK TECHNIQUE: Multidetector CT imaging of the head and neck was performed using the standard protocol during bolus administration of intravenous contrast. Multiplanar CT image  reconstructions and MIPs were obtained to evaluate the vascular anatomy. Carotid stenosis measurements (when applicable) are obtained utilizing NASCET criteria, using the distal internal carotid diameter as the denominator. CONTRAST:  75mL OMNIPAQUE IOHEXOL 350 MG/ML SOLN COMPARISON:  MR studies done yesterday. FINDINGS: CTA NECK FINDINGS Aortic arch: Aortic atherosclerosis. No aneurysm or brachiocephalic vessel origin stenosis. 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 Right carotid system: Common carotid artery is widely patent to the bifurcation region. Atherosclerotic plaque at carotid bifurcation and ICA bulb but without stenosis compared to the more distal cervical ICA diameter. Left carotid system: Common carotid artery widely patent to the bifurcation. Atherosclerotic plaque at the carotid bifurcation and ICA bulb but without stenosis compared to the more distal cervical ICA diameter. Vertebral arteries: Both vertebral artery origins are widely patent. Both vertebral arteries are widely patent through the cervical region to the foramen magnum. Skeleton: Ordinary cervical spondylosis. Right frontal calvarial mass as described at previous MRI. Other neck: No lymphadenopathy. Enlarged heterogeneous thyroid gland with multiple nodules, the largest measuring 2.1 cm. Upper chest: Negative Review of the MIP images confirms the above findings CTA HEAD FINDINGS Anterior circulation: Both internal carotid arteries are patent through the skull base and siphon regions. Ordinary siphon atherosclerotic calcification but without stenosis greater than 30%. The anterior and middle cerebral vessels are patent without correctable proximal stenosis. The anterior temporal branch of the MCA on the left appears to be occluded. Posterior circulation: Both vertebral arteries are widely patent to the basilar. Mild atherosclerotic irregularity of the basilar artery with distal basilar narrowing  of about 30%. Superior cerebellar and posterior cerebral arteries are patent. Distal PCA branch vessels show atherosclerotic irregularity. Venous sinuses: Patent and normal. Anatomic variants: None other significant. Review of the MIP images confirms the above findings IMPRESSION: Aortic atherosclerosis. 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 Atherosclerotic change at both carotid bifurcations but no stenosis. Atherosclerotic change of the carotid siphon regions but without stenosis greater than 30%. 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 This is probably chronic, similar to the MR angiography of 2018. Distal vessel atherosclerotic narrowing and irregularity of the MCA and PCA branches. Atherosclerotic irregularity of the basilar artery, maximal stenosis in the distal basilar region of approximately 30%. Right frontal calvarial lytic mass as described by MRI. Differential diagnosis is primarily metastatic disease versus myeloma versus intra osseous meningioma versus enlarging hemangioma. Electronically Signed: By: Paulina Fusi M.D. On: 05/30/2020 12:02    MR ANGIO HEAD WO CONTRAST   Result Date: 05/30/2020 CLINICAL DATA:  Initial evaluation for acute stroke, slurred speech, right-sided weakness. EXAM: MRI HEAD WITHOUT CONTRAST MRA HEAD WITHOUT CONTRAST TECHNIQUE: Multiplanar, multiecho pulse sequences of the brain and surrounding structures were obtained without intravenous contrast. Angiographic images of the head were obtained using MRA technique without contrast. COMPARISON:  Prior CT from earlier same day as well as previous MRI from 03/04/2017. FINDINGS: MRI HEAD FINDINGS Brain: Examination significantly degraded by motion artifact. Diffuse prominence of the CSF containing spaces compatible generalized age-related cerebral atrophy. Patchy T2/FLAIR hyperintensity within the periventricular and deep white matter both  cerebral hemispheres most consistent with chronic small vessel ischemic disease, moderate in nature. Multiple remote lacunar infarcts present about the bilateral basal ganglia and right thalamus. Subtle approximate 6 mm focus of diffusion abnormality seen involving the left paramedian pons, suspicious for an acute ischemic infarct (series 2, image 15). No visible associated hemorrhage or mass effect on this motion degraded exam. No other diffusion abnormality to suggest acute or subacute ischemia. Gray-white matter differentiation otherwise maintained. No other areas of remote cortical infarction. No foci of susceptibility artifact to suggest acute or chronic intracranial hemorrhage on this motion degraded exam. 2.8 cm mass involving the right frontal calvarium with extraosseous extension again seen, indeterminate. No associated mass effect  on the subjacent right frontal lobe. No other mass lesion. No mass effect or midline shift. No hydrocephalus or extra-axial fluid collection. Pituitary gland grossly within normal limits. Midline structures intact. Vascular: Major intracranial vascular flow voids are maintained. Skull and upper cervical spine: Craniocervical junction within normal limits. Upper cervical spine normal. Bone marrow signal intensity within normal limits. No other focal marrow replacing lesions. Scalp soft tissues otherwise unremarkable. Sinuses/Orbits: Globes and orbital soft tissues within normal limits. Visualized paranasal sinuses are grossly clear. No significant mastoid effusion. Other: None. MRA HEAD FINDINGS ANTERIOR CIRCULATION: Examination moderately degraded by motion artifact. Visualized distal cervical segments of the internal carotid arteries are patent with antegrade flow. Petrous segments widely patent. Scattered atheromatous irregularity throughout the cavernous/supraclinoid ICAs without hemodynamically significant stenosis. Right A1 segment widely patent. Left A1 hypoplastic and/or  absent, accounting for the diminutive left ICA is compared to the right. Normal anterior communicating artery complex. Anterior cerebral arteries patent to their distal aspects without appreciable stenosis. M1 segments patent bilaterally. Grossly normal MCA bifurcations. Distal MCA branches well perfused and fairly symmetric. POSTERIOR CIRCULATION: Vertebral arteries patent to the vertebrobasilar junction without stenosis. Left vertebral artery slightly dominant. Neither PICA origin visualized. Basilar widely patent proximally. There is a short-segment moderate stenosis involving the mid basilar artery (series 2, image 91). Basilar otherwise patent to its distal aspect. Superior cerebral arteries grossly patent proximally. PCAs primarily supplied via the basilar, both of which are grossly patent to their distal aspects without definite stenosis, although evaluation limited by motion. No intracranial aneurysm. IMPRESSION: MRI HEAD IMPRESSION: 1. Motion degraded exam. 2. Subtle approximate 6 mm acute ischemic nonhemorrhagic left pontine infarct. 3. 2.8 cm mass involving the right frontal calvarium with extraosseous extension, indeterminate. In retrospect, this lesion appears to have been present on prior MRI from 2018, although is Belen larger on today's exam. Again, primary differential considerations include a possible intra osseous meningioma versus osseous metastasis. Neuro surgical consultation and referral for consideration of possible histologic sampling suggested for further evaluation. Additionally, postcontrast imaging could be performed for complete evaluation as clinically desired. 4. Underlying age-related cerebral atrophy with moderate chronic microvascular ischemic disease, with multiple remote lacunar infarcts about the bilateral basal ganglia and right thalamus. MRA HEAD IMPRESSION: 1. Motion degraded exam. 2. Negative intracranial MRA for large vessel occlusion. 3. Short-segment moderate stenosis  involving the mid basilar artery. 4. Additional scattered intracranial atherosclerotic irregularity, with no other hemodynamically significant or correctable stenosis identified. Electronically Signed   By: Rise Mu M.D.   On: 05/30/2020 00:34    MR BRAIN WO CONTRAST   Result Date: 05/30/2020 CLINICAL DATA:  Initial evaluation for acute stroke, slurred speech, right-sided weakness. EXAM: MRI HEAD WITHOUT CONTRAST MRA HEAD WITHOUT CONTRAST TECHNIQUE: Multiplanar, multiecho pulse sequences of the brain and surrounding structures were obtained without intravenous contrast. Angiographic images of the head were obtained using MRA technique without contrast. COMPARISON:  Prior CT from earlier same day as well as previous MRI from 03/04/2017. FINDINGS: MRI HEAD FINDINGS Brain: Examination significantly degraded by motion artifact. Diffuse prominence of the CSF containing spaces compatible generalized age-related cerebral atrophy. Patchy T2/FLAIR hyperintensity within the periventricular and deep white matter both cerebral hemispheres most consistent with chronic small vessel ischemic disease, moderate in nature. Multiple remote lacunar infarcts present about the bilateral basal ganglia and right thalamus. Subtle approximate 6 mm focus of diffusion abnormality seen involving the left paramedian pons, suspicious for an acute ischemic infarct (series 2, image 15). No visible associated hemorrhage  or mass effect on this motion degraded exam. No other diffusion abnormality to suggest acute or subacute ischemia. Gray-white matter differentiation otherwise maintained. No other areas of remote cortical infarction. No foci of susceptibility artifact to suggest acute or chronic intracranial hemorrhage on this motion degraded exam. 2.8 cm mass involving the right frontal calvarium with extraosseous extension again seen, indeterminate. No associated mass effect on the subjacent right frontal lobe. No other mass lesion.  No mass effect or midline shift. No hydrocephalus or extra-axial fluid collection. Pituitary gland grossly within normal limits. Midline structures intact. Vascular: Major intracranial vascular flow voids are maintained. Skull and upper cervical spine: Craniocervical junction within normal limits. Upper cervical spine normal. Bone marrow signal intensity within normal limits. No other focal marrow replacing lesions. Scalp soft tissues otherwise unremarkable. Sinuses/Orbits: Globes and orbital soft tissues within normal limits. Visualized paranasal sinuses are grossly clear. No significant mastoid effusion. Other: None. MRA HEAD FINDINGS ANTERIOR CIRCULATION: Examination moderately degraded by motion artifact. Visualized distal cervical segments of the internal carotid arteries are patent with antegrade flow. Petrous segments widely patent. Scattered atheromatous irregularity throughout the cavernous/supraclinoid ICAs without hemodynamically significant stenosis. Right A1 segment widely patent. Left A1 hypoplastic and/or absent, accounting for the diminutive left ICA is compared to the right. Normal anterior communicating artery complex. Anterior cerebral arteries patent to their distal aspects without appreciable stenosis. M1 segments patent bilaterally. Grossly normal MCA bifurcations. Distal MCA branches well perfused and fairly symmetric. POSTERIOR CIRCULATION: Vertebral arteries patent to the vertebrobasilar junction without stenosis. Left vertebral artery slightly dominant. Neither PICA origin visualized. Basilar widely patent proximally. There is a short-segment moderate stenosis involving the mid basilar artery (series 2, image 91). Basilar otherwise patent to its distal aspect. Superior cerebral arteries grossly patent proximally. PCAs primarily supplied via the basilar, both of which are grossly patent to their distal aspects without definite stenosis, although evaluation limited by motion. No intracranial  aneurysm. IMPRESSION: MRI HEAD IMPRESSION: 1. Motion degraded exam. 2. Subtle approximate 6 mm acute ischemic nonhemorrhagic left pontine infarct. 3. 2.8 cm mass involving the right frontal calvarium with extraosseous extension, indeterminate. In retrospect, this lesion appears to have been present on prior MRI from 2018, although is Joanny larger on today's exam. Again, primary differential considerations include a possible intra osseous meningioma versus osseous metastasis. Neuro surgical consultation and referral for consideration of possible histologic sampling suggested for further evaluation. Additionally, postcontrast imaging could be performed for complete evaluation as clinically desired. 4. Underlying age-related cerebral atrophy with moderate chronic microvascular ischemic disease, with multiple remote lacunar infarcts about the bilateral basal ganglia and right thalamus. MRA HEAD IMPRESSION: 1. Motion degraded exam. 2. Negative intracranial MRA for large vessel occlusion. 3. 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 4. Additional scattered intracranial atherosclerotic irregularity, with no other hemodynamically significant or correctable stenosis identified. Electronically Signed   By: Rise Mu M.D.   On: 05/30/2020 00:34    ECHOCARDIOGRAM COMPLETE   Result Date: 05/30/2020    ECHOCARDIOGRAM REPORT   Patient Name:   [facility]    Date of Exam: 05/30/2020 Medical Rec #:  [id number]  Height:       59.0 in Accession #:    [id number] Weight:       151.5 lb Date of Birth:  May 25, 1924   BSA:          1.639 m Patient Age:    89 years   BP:  136/101 mmHg Patient Gender: F          HR:           75 bpm. Exam Location:  Inpatient Procedure: 2D Echo, Cardiac Doppler and Color Doppler Indications:    Stroke 434.91 / I163.9  History:        Patient has prior history of Echocardiogram examinations, most                 recent 03/06/2017. Risk Factors:Hypertension and  Dyslipidemia.  Sonographer:    Elmarie Shiley Dance Referring Phys: [id number] VASUNDHRA RATHORE IMPRESSIONS  1. Intracavitary gradient. Peak velocity 2.1 m/s. Peak gradient 17.6 mmHg. Left ventricular ejection fraction, by estimation, is >75%. The left ventricle has hyperdynamic function. The left ventricle has no regional wall motion abnormalities. There is mild concentric left ventricular hypertrophy. Left ventricular diastolic parameters are consistent with Grade I diastolic dysfunction (impaired relaxation).  2. Right ventricular systolic function is normal. The right ventricular size is normal. There is normal pulmonary artery systolic pressure.  3. Left atrial size was mildly dilated.  4. The mitral valve is normal in structure. No evidence of mitral valve regurgitation. No evidence of mitral stenosis.  5. The aortic valve is tricuspid. Aortic valve regurgitation is mild. No aortic stenosis is present.  6. The inferior vena cava is normal in size with greater than 50% respiratory variability, suggesting right atrial pressure of 3 mmHg. FINDINGS  Left Ventricle: Intracavitary gradient. Peak velocity 2.1 m/s. Peak gradient 17.6 mmHg. Left ventricular ejection fraction, by estimation, is >75%. The left ventricle has hyperdynamic function. The left ventricle has no regional wall motion abnormalities. The left ventricular internal cavity size was normal in size. There is mild concentric left ventricular hypertrophy. Left ventricular diastolic parameters are consistent with Grade I diastolic dysfunction (impaired relaxation). Right Ventricle: The right ventricular size is normal. No increase in right ventricular wall thickness. Right ventricular systolic function is normal. There is normal pulmonary artery systolic pressure. The tricuspid regurgitant velocity is 1.67 m/s, and  with an assumed right atrial pressure of 3 mmHg, the estimated right ventricular systolic pressure is 14.2 mmHg. Left Atrium: Left atrial size was  mildly dilated. Right Atrium: Right atrial size was normal in size. Pericardium: A small pericardial effusion is present. There is no evidence of cardiac tamponade. Mitral Valve: The mitral valve is normal in structure. Normal mobility of the mitral valve leaflets. No evidence of mitral valve regurgitation. No evidence of mitral valve stenosis. Tricuspid Valve: The tricuspid valve is normal in structure. Tricuspid valve regurgitation is trivial. No evidence of tricuspid stenosis. Aortic Valve: The aortic valve is tricuspid. Aortic valve regurgitation is mild. Aortic regurgitation PHT measures 300 msec. No aortic stenosis is present. Pulmonic Valve: The pulmonic valve was normal in structure. Pulmonic valve regurgitation is trivial. No evidence of pulmonic stenosis. Aorta: The aortic root is normal in size and structure. Venous: The inferior vena cava is normal in size with greater than 50% respiratory variability, suggesting right atrial pressure of 3 mmHg. IAS/Shunts: No atrial level shunt detected by color flow Doppler.  LEFT VENTRICLE PLAX 2D LVIDd:         3.21 cm LVIDs:         2.24 cm LV PW:         1.26 cm LV IVS:        1.20 cm LVOT diam:     2.00 cm LV SV:         75 LV SV  Index:   46 LVOT Area:     3.14 cm  RIGHT VENTRICLE            IVC RV Basal diam:  2.16 cm    IVC diam: 1.53 cm RV S prime:     9.03 cm/s TAPSE (M-mode): 1.9 cm LEFT ATRIUM             Index       RIGHT ATRIUM           Index LA diam:        4.20 cm 2.56 cm/m  RA Area:     12.50 cm LA Vol (A2C):   32.7 ml 19.95 ml/m RA Volume:   27.70 ml  16.90 ml/m LA Vol (A4C):   50.2 ml 30.63 ml/m LA Biplane Vol: 41.9 ml 25.57 ml/m  AORTIC VALVE LVOT Vmax:   101.00 cm/s LVOT Vmean:  75.100 cm/s LVOT VTI:    0.238 m AI PHT:      300 msec  AORTA Ao Root diam: 2.90 cm Ao Asc diam:  3.50 cm MITRAL VALVE                TRICUSPID VALVE MV Area (PHT): 2.50 cm     TR Peak grad:   11.2 mmHg MV Decel Time: 303 msec     TR Vmax:        167.00 cm/s MV E  velocity: 69.20 cm/s MV A velocity: 127.00 cm/s  SHUNTS MV E/A ratio:  0.54         Systemic VTI:  0.24 m                             Systemic Diam: 2.00 cm Chilton Siiffany Byhalia MD Electronically signed by Chilton Siiffany [place] MD Signature Date/Time: 05/30/2020/12:19:32 PM    Final     CT HEAD CODE STROKE WO CONTRAST   Result Date: 05/29/2020 CLINICAL DATA:  Code stroke. Initial evaluation for acute slurred speech, leg weakness. EXAM: CT HEAD WITHOUT CONTRAST TECHNIQUE: Contiguous axial images were obtained from the base of the skull through the vertex without intravenous contrast. COMPARISON:  Prior study from 03/04/2017. FINDINGS: Brain: Generalized age-related cerebral atrophy with moderate chronic microvascular ischemic disease. Scattered remote lacunar infarcts present about the bilateral basal ganglia and right internal capsule/thalamus. No acute intracranial hemorrhage. No acute large vessel territory infarct. No parenchymal mass lesion, mass effect, or midline shift. No hydrocephalus or extra-axial fluid collection. There is a hyperdense ovoid lesion measuring approximately 2.5 cm positioned within the right frontal calvarium (series 3, image 18). Upon review of prior CT from 2018, this lesion was likely present, although faintly visible at that time. Associated lytic changes within the underlying calvarium with probable associated pathologic fracture (series 4, image 46). Soft tissue component extends into the overlying right frontal scalp as well. Vascular: No hyperdense vessel. Calcified atherosclerosis present at the skull base. Skull: 2.5 cm lytic lesion involving the right frontal calvarium as above. Calvarium otherwise intact. No other scalp soft tissue abnormality. Sinuses/Orbits: Globes and orbital soft tissues within normal limits. Scattered mucosal thickening noted within the visualized ethmoidal air cells. Visualized paranasal sinuses are otherwise clear. No mastoid effusion. Other: None. ASPECTS  [facility]([place] Stroke Program Early CT Score) - Ganglionic level infarction (caudate, lentiform nuclei, internal capsule, insula, M1-M3 cortex): 7 - Supraganglionic infarction (M4-M6 cortex): 3 Total score (0-10 with 10 being normal): 10 IMPRESSION: 1. No acute intracranial infarct or other  abnormality. 2. ASPECTS is 10. 3. Approximate 2.5 cm lytic lesion involving the right frontal calvarium with probable associated pathologic fracture. Finding is nonspecific, although is suspected to have been present on previous CT from 2018, although only faintly visible at that time. Primary differential consideration consists of an intra osseous meningioma, although possible osseous metastasis not excluded. Further evaluation with dedicated brain MRI, with and without contrast suggested for further evaluation. Additionally, neuro surgical consultation and referral for possible histologic sampling suggested. 4. Underlying age-related cerebral atrophy with moderate chronic microvascular ischemic disease, with multiple remote lacunar infarcts about the bilateral basal ganglia as above. Critical Value/emergent results were called by telephone at the time of interpretation on 05/29/2020 at 10:32 pm to provider [facility] , who verbally acknowledged these results. Electronically Signed   By: Rise Mu M.D.   On: 05/29/2020 22:36             Medical Problem List and Plan: 1. Left side side weakness and slurred speech  secondary to Acute left pontine infarction             -patient may shower             -ELOS/Goals: 21-25d, Min A ADL and Mobility  2.  Antithrombotics: -DVT/anticoagulation: Lovenox             -antiplatelet therapy: Aspirin 81 mg daily and Plavix 75 mg daily x3 weeks then aspirin alone 3. Pain Management: Tylenol as needed, has Musculoskeletal CP , sportscreme    4. Mood: Provide emotional support             -antipsychotic agents: N/A 5. Neuropsych: This patient is capable of making decisions on  her own behalf. 6. Skin/Wound Care: Routine skin checks 7. Fluids/Electrolytes/Nutrition: Routine in and outs with follow-up chemistries 8.  Dysphagia.  Dysphagia #2 nectar liquids.  Follow-up speech therapy 9.  Hyperlipidemia.  Lipitor     Mcarthur Rossetti Angiulli, PA-C 05/31/2020 "I have personally performed a face to face diagnostic evaluation of this patient.  Additionally, I have reviewed and concur with the physician assistant's documentation above." Erick Colace M.D. [facility] FAAPM&R (Neuromuscular Med) Diplomate Am Board of Electrodiagnostic Med Fellow Am Board of Interventional Pain

## 2020-06-03 ENCOUNTER — Inpatient Hospital Stay (HOSPITAL_COMMUNITY): Payer: Medicare Other

## 2020-06-03 NOTE — Evaluation (Signed)
Occupational Therapy Assessment and Plan  Patient Details  Name: Ann Coleman MRN: [id number] Date of Birth: 1924/09/01  OT Diagnosis: acute pain, cognitive deficits, hemiplegia affecting dominant side and muscle weakness (generalized) Rehab Potential: Rehab Potential (ACUTE ONLY): Fair ELOS: 3-4 weeks   Today's Date: 06/03/2020 OT Individual Time: 1100-1200 OT Individual Time Calculation (min): 60 min     Hospital Problem: Principal Problem:   Brainstem infarct, acute ([place]) Active Problems:   Left pontine cerebrovascular accident [facility])   Past Medical History:  Past Medical History:  Diagnosis Date   High cholesterol    Hypertension    Stroke [facility])    Past Surgical History:  Past Surgical History:  Procedure Laterality Date   NO PAST SURGERIES      Assessment & Plan Clinical Impression: Ann Coleman a 86 year old right-handed non-English-speaking [place] female with history of hypertension and hyperlipidemia as well as CVA 3 years ago. Per chart review lives with her children. 1 level home 2 steps to entry. Reportedly independent prior to admission using a walker and assistance from her daughter. Presented 05/29/2020 with left-sided weakness and slurred speech. Noted systolic blood pressure in the 190s. 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 In retrospect of this lesion appeared to have been present on her prior MRI from 2018. 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 This did appear chronic and again noted on MRI 2018. Echocardiogram with ejection fraction of 75% no wall motion abnormalities.MRI of the brain was again completed 05/31/2020 due to increased left-sided weaknessshowing mild increase in size of small acute  infarct of the left parasagittal pons.Admission chemistries BUN 5 creatinine 1.03, SARS Covid is negative. She is on a dysphagia #2nectar thick liquid diet. Currently maintained on aspirin and Plavix for CVA prophylaxis x3 weeks and aspirin alone. Subcutaneous Lovenox for DVT prophylaxis. Therapy evaluations completed with recommendations of physical medicine rehab consult. Patient was admitted for a comprehensive rehab program..  Patient transferred to CIR on 06/02/2020 .    Patient currently requires total with basic self-care skills secondary to muscle weakness, decreased cardiorespiratoy endurance, motor apraxia, decreased coordination and decreased motor planning, decreased midline orientation, decreased attention to right, right side neglect and decreased motor planning, decreased initiation, decreased attention, decreased awareness, decreased problem solving, decreased safety awareness, decreased memory and delayed processing and decreased sitting balance, decreased standing balance, decreased postural control, hemiplegia and decreased balance strategies.  Prior to hospitalization, patient could complete ADLs with modified independent .  Patient will benefit from skilled intervention to decrease level of assist with basic self-care skills prior to discharge home with care partner.  Anticipate patient will require 24 hour supervision and minimal physical assistance and follow up home health.  OT - End of Session Activity Tolerance: Tolerates < 10 min activity, no significant change in vital signs Endurance Deficit: Yes Endurance Deficit Description: generalized weakness and deconditioning OT Assessment Rehab Potential (ACUTE ONLY): Fair OT Patient demonstrates impairments in the following area(s): Balance;Safety;Endurance;Motor;Pain;Perception;Cognition;Vision OT Basic ADL's Functional Problem(s): Eating;Grooming;Bathing;Dressing;Toileting OT Transfers Functional Problem(s):  Toilet;Tub/Shower OT Additional Impairment(s): Fuctional Use of Upper Extremity OT Plan OT Intensity: Minimum of 1-2 x/day, 45 to 90 minutes OT Frequency: 5 out of 7 days OT Duration/Estimated Length of Stay: 3-4 weeks OT Treatment/Interventions: Balance/vestibular training;Discharge planning;Functional electrical stimulation;Pain management;Self Care/advanced  ADL retraining;Therapeutic Activities;UE/LE Coordination activities;Cognitive remediation/compensation;Disease mangement/prevention;Community reintegration;Functional mobility training;Patient/family education;Skin care/wound managment;Therapeutic Exercise;Wheelchair propulsion/positioning;UE/LE Strength taining/ROM;DME/adaptive equipment instruction;Neuromuscular re-education;Psychosocial support;Visual/perceptual remediation/compensation OT Self Feeding Anticipated Outcome(s): (S) OT Basic Self-Care Anticipated Outcome(s): min A OT Toileting Anticipated Outcome(s): min A OT Bathroom Transfers Anticipated Outcome(s): min A OT Recommendation Recommendations for Other Services: Speech consult Patient destination: Home Follow Up Recommendations: Home health OT Equipment Recommended: To be determined   Skilled Therapeutic Intervention Skilled OT evaluation completed. Pt and her daughter were provided edu on OT POC, rehab expectations, CVA recovery, d/c planning, and ELOS. Pt reporting need to have BM upon entry to room. +2 required for all mobility this session, including bed mobility, supine> sit, and sit > stand/pivot to BSC. Blocking of R knee required. Pt with R inattention that affects volitional movement in her UE and LE. Pt completed ADLs while on BSC. Mod HOH facilitation required for pt to wash UB with her RUE. Total A for peri hygiene and LB dressing. Pt with high levels of fatigue. Pt returned to bed via slideboard with total +2 assist. Extra time spent edu pt and daughter on positioning of RUE while in bed, with towel placed under  shoulder and pillow to support RUE in slight elevation to prevent edema. Pt was left supine with all needs met, bed alarm set.   OT Evaluation Precautions/Restrictions  Precautions Precautions: Fall Precaution Comments: R hemiplegia Restrictions Weight Bearing Restrictions: No General Chart Reviewed: Yes Family/Caregiver Present: Yes ([name]) Vital Signs  Pain Pain Assessment Pain Scale: Faces Faces Pain Scale: Hurts little more Pain Type: Acute pain Pain Location: Back Pain Orientation: Lower Pain Descriptors / Indicators: Aching Pain Onset: With Activity Pain Intervention(s): Rest;Repositioned Home Living/Prior Functioning Home Living Available Help at Discharge: Family Type of Home: House Home Access: Stairs to enter Technical brewer of Steps: 2-3 Entrance Stairs-Rails: None Home Layout: One level Bathroom Shower/Tub: Chiropodist: Standard Additional Comments: Information taken from chart  Lives With: Daughter IADL History Homemaking Responsibilities: No Current License: No Prior Function Level of Independence: Independent with gait, Independent with transfers, Requires assistive device for independence  Able to Take Stairs?: Yes Comments: Limited information due to communication barrier. Pt reports she did not need DME to ambulate, but also reported use of a cane and lives with daughter ADL ADL Eating: Minimal assistance Where Assessed-Eating: Bed level Grooming: Minimal assistance Where Assessed-Grooming: Chair Upper Body Bathing: Moderate assistance, Moderate cueing Where Assessed-Upper Body Bathing: Sitting at sink Lower Body Bathing: Maximal assistance Where Assessed-Lower Body Bathing: Chair Upper Body Dressing: Maximal assistance Where Assessed-Upper Body Dressing: Sitting at sink Lower Body Dressing: Dependent Where Assessed-Lower Body Dressing: Sitting at sink Toileting: Dependent Where Assessed-Toileting: Bedside  Commode Toilet Transfer: Maximal verbal cueing, Dependent Toilet Transfer Method: Stand pivot Toilet Transfer Equipment: Bedside commode Tub/Shower Transfer: Unable to assess Social research officer, government: Unable to assess Vision Baseline Vision/History: No visual deficits Patient Visual Report: No change from baseline Vision Assessment?: Yes Eye Alignment: Within Functional Limits Ocular Range of Motion: Within Functional Limits Alignment/Gaze Preference: Gaze left (can look R with cueing, but keep head and gaze L) Tracking/Visual Pursuits: Impaired - to be further tested in functional context;Requires cues, head turns, or add eye shifts to track Saccades: Impaired - to be further tested in functional context (slow) Perception  Perception: Impaired Inattention/Neglect: Does not attend to right side of body;Does not attend to right visual field Praxis Praxis: Impaired Praxis Impairment Details: Initiation;Motor planning Cognition Overall Cognitive Status: Impaired/Different from baseline  Arousal/Alertness: Awake/alert Orientation Level: Person;Place;Situation Person: Oriented Place: Oriented Situation: Oriented Year:  (would not give year, just stating "I don't know" per interpretor) Month:  (Stated "I don't know" per interpretor) Day of Week: Incorrect Memory: Impaired Memory Impairment: Retrieval deficit;Decreased short term memory Decreased Short Term Memory: Verbal basic Immediate Memory Recall: Sock;Blue;Bed Memory Recall Sock: Not able to recall Memory Recall Blue: Not able to recall Memory Recall Bed: Not able to recall Attention: Sustained Focused Attention: Impaired Focused Attention Impairment: Functional basic;Verbal complex Sustained Attention: Impaired Sustained Attention Impairment: Functional basic;Verbal complex Awareness: Impaired Awareness Impairment: Intellectual impairment Problem Solving: Impaired Problem Solving Impairment: Functional basic Executive  Function: Self Monitoring;Sequencing;Self Correcting Sequencing: Impaired Sequencing Impairment: Functional basic Self Monitoring: Impaired Self Monitoring Impairment: Functional basic Self Correcting: Impaired Self Correcting Impairment: Functional basic Safety/Judgment: Impaired Sensation Sensation Light Touch: Appears Intact Proprioception: Impaired Detail Proprioception Impaired Details: Impaired RLE;Impaired RUE Coordination Gross Motor Movements are Fluid and Coordinated: No Fine Motor Movements are Fluid and Coordinated: No Coordination and Movement Description: R hemiplegia and R inattention limiting all mobility Finger Nose Finger Test: Overshooting, uncoordinated Heel Shin Test: unable to follow cues bilaterally Motor  Motor Motor: Hemiplegia;Abnormal postural alignment and control;Abnormal tone Motor - Skilled Clinical Observations: generalized weaknes and R hemi/inattention Mobility  Bed Mobility Bed Mobility: Rolling Right;Rolling Left;Supine to Sit;Sit to Supine Rolling Right: Maximal Assistance - Patient 25-49% Rolling Left: Maximal Assistance - Patient 25-49% Supine to Sit: Maximal Assistance - Patient - Patient 25-49% Sit to Supine: 2 Helpers Transfers Sit to Stand: 2 Helpers Stand to Sit: 2 Helpers  Trunk/Postural Assessment  Cervical Assessment Cervical Assessment: Exceptions to [facility] (forward head) Thoracic Assessment Thoracic Assessment: Exceptions to [facility] (rounded shoulders) Lumbar Assessment Lumbar Assessment: Exceptions to [facility] (posterior pelvic tilt) Postural Control Postural Control: Deficits on evaluation Righting Reactions: delayed Protective Responses: delayed  Balance Balance Balance Assessed: Yes Static Sitting Balance Static Sitting - Balance Support: Left upper extremity supported;Feet unsupported Static Sitting - Level of Assistance: 3: Mod assist Dynamic Sitting Balance Dynamic Sitting - Balance Support: Left upper extremity  supported;Feet unsupported Dynamic Sitting - Level of Assistance: 2: Max assist Static Standing Balance Static Standing - Balance Support: Left upper extremity supported Static Standing - Level of Assistance: 1: +2 Total assist Dynamic Standing Balance Dynamic Standing - Balance Support: During functional activity;Bilateral upper extremity supported Dynamic Standing - Level of Assistance: 1: +2 Total assist Extremity/Trunk Assessment RUE Assessment RUE Assessment: Exceptions to [facility] Active Range of Motion (AROM) Comments: R inattention severely affects motor planning and voluntary movement RUE Body System: Neuro Brunstrum levels for arm and hand: Arm;Hand Brunstrum level for arm: Stage II Synergy is developing Brunstrum level for hand: Stage II Synergy is developing RUE AROM (degrees) Right Shoulder Flexion: 20 Degrees Right Elbow Flexion: 40 Right Wrist Flexion: 10 Degrees LUE Assessment LUE Assessment: Within Functional Limits (WFL for age)     Refer to Care Plan for Long Term Goals  Recommendations for other services: None    Discharge Criteria: Patient will be discharged from OT if patient refuses treatment 3 consecutive times without medical reason, if treatment goals not met, if there is a change in medical status, if patient makes no progress towards goals or if patient is discharged from hospital.  The above assessment, treatment plan, treatment alternatives and goals were discussed and mutually agreed upon: by patient and by family  Curtis Sites 06/03/2020, 12:51 PM

## 2020-06-03 NOTE — Progress Notes (Signed)
Inpatient Rehabilitation Medication Review by a Pharmacist  A complete drug regimen review was completed for this patient to identify any potential clinically significant medication issues.  Clinically significant medication issues were identified:  yes   Type of Medication Issue Identified Description of Issue Plan Plan Accepted by Provider? (Yes / No)  Drug Interaction(s) (clinically significant)      Duplicate Therapy      Allergy      No Medication Administration End Date   DAPT intended to be continued for 21 days then monotherapy with aspirin. No stop date included on clopidogrel    Incorrect Dose      Additional Drug Therapy Needed      Other        Name of provider notified for issues identified: Dr. Claudette Laws  Provider Method of Notification: [facility] Secure Chat   Pharmacist comments: Clopidogrel started on 05/31/2020. Last day of clopidogrel should be 06/20/2020  Time spent performing this drug regimen review (minutes):  10 minutes   Vicente Serene 06/03/2020 12:36 PM

## 2020-06-03 NOTE — Evaluation (Signed)
Physical Therapy Assessment and Plan  Patient Details  Name: Ann Coleman MRN: [id number] Date of Birth: 12-Mar-1924  PT Diagnosis: Abnormal posture, Abnormality of gait, Cognitive deficits, Coordination disorder, Difficulty walking, Edema, Hemiplegia dominant, Hypertonia, Impaired cognition and Muscle weakness Rehab Potential: Good ELOS: 3-4 weeks   Today's Date: 06/03/2020 PT Individual Time: 0901-1010 PT Individual Time Calculation (min): 69 min    Hospital Problem: Principal Problem:   Brainstem infarct, acute ([place]) Active Problems:   Left pontine cerebrovascular accident [facility])   Past Medical History:  Past Medical History:  Diagnosis Date  . High cholesterol   . Hypertension   . Stroke [facility])    Past Surgical History:  Past Surgical History:  Procedure Laterality Date  . NO PAST SURGERIES      Assessment & Plan Clinical Impression: Patient is a 86 y.o. year old right-handed non-English-speaking [place] female with history of hypertension and hyperlipidemia as well as CVA 3 years ago. Per chart review lives with her children. 1 level home 2 steps to entry. Reportedly independent prior to admission using a walker and assistance from her daughter. Presented 05/29/2020 with left-sided weakness and slurred speech. Noted systolic blood pressure in the 190s. 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 In retrospect of this lesion appeared to have been present on her prior MRI from 2018. 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 This did appear chronic and again noted on MRI 2018. Echocardiogram with ejection fraction of 75% no wall motion abnormalities.MRI of the brain was again completed 05/31/2020 due to increased left-sided  weaknessshowing mild increase in size of small acute infarct of the left parasagittal pons.Admission chemistries BUN 5 creatinine 1.03, SARS Covid is negative. She is on a dysphagia #2nectar thick liquid diet. Currently maintained on aspirin and Plavix for CVA prophylaxis x3 weeks and aspirin alone. Subcutaneous Lovenox for DVT prophylaxis. Therapy evaluations completed with recommendations of physical medicine rehab consult. Patient was admitted for a comprehensive rehab program. Patient transferred to CIR on 06/02/2020 .   Patient currently requires max +2 with mobility secondary to muscle weakness, decreased cardiorespiratoy endurance, impaired timing and sequencing, abnormal tone, decreased coordination and decreased motor planning, decreased visual motor skills, decreased midline orientation, decreased attention to right and decreased motor planning, decreased initiation, decreased attention, decreased awareness, decreased problem solving, decreased safety awareness, decreased memory and delayed processing and decreased sitting balance, decreased standing balance, decreased postural control, hemiplegia and decreased balance strategies.  Prior to hospitalization, patient was modified independent  with mobility and lived with Daughter in a House home.  Home access is 2-3Stairs to enter.  Patient will benefit from skilled PT intervention to maximize safe functional mobility, minimize fall risk and decrease caregiver burden for planned discharge home with 24 hour assist.  Anticipate patient will benefit from follow up [facility] at discharge.  PT - End of Session Activity Tolerance: Tolerates 30+ min activity with multiple rests Endurance Deficit: Yes Endurance Deficit Description: generalized weakness and deconditioning PT Assessment Rehab Potential (ACUTE/IP ONLY): Good PT Barriers to Discharge: [place] home environment;Home environment access/layout;Behavior PT Barriers to Discharge Comments:  Patient has 2-3 STE home without rails, patient with new cognitive deficits limiting attention and safety weith mobility PT Patient demonstrates impairments in the following area(s): Balance;Behavior;Edema;Endurance;Motor;Nutrition;Pain;Perception;Safety;Sensory;Skin Integrity PT Transfers Functional Problem(s):  Bed Mobility;Bed to Chair;Car;Furniture;Floor PT Locomotion Functional Problem(s): Ambulation;Stairs;Wheelchair Mobility PT Plan PT Intensity: Minimum of 1-2 x/day ,45 to 90 minutes PT Frequency: 5 out of 7 days PT Duration Estimated Length of Stay: 3-4 weeks PT Treatment/Interventions: Ambulation/gait training;Community reintegration;DME/adaptive equipment instruction;Neuromuscular re-education;Psychosocial support;Stair training;UE/LE Strength taining/ROM;Wheelchair propulsion/positioning;Balance/vestibular training;Discharge planning;Functional electrical stimulation;Pain management;Skin care/wound management;Therapeutic Activities;UE/LE Coordination activities;Cognitive remediation/compensation;Disease management/prevention;Functional mobility training;Patient/family education;Splinting/orthotics;Therapeutic Exercise;Visual/perceptual remediation/compensation PT Transfers Anticipated Outcome(s): min A using LRAD PT Locomotion Anticipated Outcome(s): Supervision w/c PT Recommendation Recommendations for Other Services: Neuropsych consult;Therapeutic Recreation consult Therapeutic Recreation Interventions: Stress management Follow Up Recommendations: Home health PT Patient destination: Home Equipment Details: Has SPC, will determine appropriate DME needs based on patient progress  Skilled Therapeutic Intervention In addition to the PT evaluation below, the patient performed the following skilled PT interventions: Patient in bed with her son in the room upon PT arrival. Patient alert and agreeable to PT session. Patient denied pain during session. Interpreter present through  session.  Therapeutic Activity: Bed Mobility: Patient performed rolling R with mod A and L with max A, supine to sit with mod-max A of 1 person and sit to supine with max-total A +2 due to fatigue. Mobility performed in a flat bed without use of bed rails. Patient performed sitting balance EOB >5 min progressing from min A-CGA with mild posterior and L lean. Able to self correct x1 otherwise required manual facilitation for midline orientation throughout.  Transfers: Patient performed sit to/from stand x1 with max A of 1 person, patient was unable to come to a full stand due to R hemiplegia and poor motor planning. Performed stand pivot bed>BSC with max A +2 using 3 musketeer technique. Required use of Stedy to perform peri-care and transfer from BSC>w/c with max A +2 for mobility and total A for peri-care and doffing/donning incontinence brief during toileting. Patient was continent of bladder on BSC.   Once in w/c, patient performed reciprocal scooting x4 with max-total A. Once scooted back patient was unable to support her trunk and head, leaning back into thoracit and cervical extension requiring total A for head and trunk support seated in the w/c. Patient reported feeling very fatigued and performed a squat pivot transfer with max A +2 back to bed and total A for scooting up in the bed.   Patient's daughter arrived  and MD rounded at end of evaluation. MD performed daily assessment, PT updated MD and family on mobility, LOS, and present deficits.   Patient in bed with her daughter and the interpreter in the room at end of session with breaks locked, bed alarm set, and all needs within reach.   Educated patient's family on modified diet and nectar thick liquids, noted thin liquids in the room, family stated that patient had been taking sips from these. Removed drinks from patient's reach and notified RN.   Instructed pt and family in results of PT evaluation as detailed below, PT POC, rehab  potential, rehab goals, and discharge recommendations. Additionally discussed CIR's policies regarding fall safety and use of chair alarm and/or quick release belt. Pt verbalized understanding and in agreement.    PT Evaluation Precautions/Restrictions Precautions Precautions: Fall Precaution Comments: R hemiplegia Restrictions Weight Bearing Restrictions: No Home Living/Prior Functioning Home Living Available Help at Discharge: Family Type of Home: House Home Access: Stairs to enter [employer] of Steps: 2-3 Entrance Stairs-Rails: None Home Layout: One level Bathroom Shower/Tub: Chiropodist: Standard Additional Comments: Information taken from chart  Lives With: Daughter Prior Function  Level of Independence: Independent with gait;Independent with transfers;Requires assistive device for independence  Able to Take Stairs?: Yes Comments: Limited information due to communication barrier. Pt reports she did not need DME to ambulate, but also reported use of a cane and lives with daughter Vision/Perception  Vision - Assessment Eye Alignment: Within Functional Limits Ocular Range of Motion: Within Functional Limits Alignment/Gaze Preference: Gaze left (can look R with cueing, but keep head and gaze L) Tracking/Visual Pursuits: Impaired - to be further tested in functional context;Requires cues, head turns, or add eye shifts to track Saccades: Impaired - to be further tested in functional context (slow) Perception Perception: Impaired Inattention/Neglect: Does not attend to right side of body;Does not attend to right visual field Praxis Praxis: Impaired Praxis Impairment Details: Initiation;Motor planning  Cognition Overall Cognitive Status: Impaired/Different from baseline Arousal/Alertness: Awake/alert Attention: Sustained Focused Attention: Impaired Focused Attention Impairment: Functional basic;Verbal complex Sustained Attention:  Impaired Sustained Attention Impairment: Functional basic;Verbal complex Memory: Impaired Memory Impairment: Retrieval deficit;Decreased short term memory Decreased Short Term Memory: Verbal basic Awareness: Impaired Awareness Impairment: Intellectual impairment Problem Solving: Impaired Problem Solving Impairment: Functional basic Executive Function: Self Monitoring;Sequencing;Self Correcting Sequencing: Impaired Sequencing Impairment: Functional basic Self Monitoring: Impaired Self Monitoring Impairment: Functional basic Self Correcting: Impaired Self Correcting Impairment: Functional basic Safety/Judgment: Impaired Sensation Sensation Light Touch: Appears Intact Proprioception: Impaired Detail Proprioception Impaired Details: Impaired RLE;Impaired RUE Coordination Gross Motor Movements are Fluid and Coordinated: No Fine Motor Movements are Fluid and Coordinated: No Coordination and Movement Description: R hemiplegia and R inattention limiting all mobility Finger Nose Finger Test: Overshooting, uncoordinated Heel Shin Test: unable to follow cues bilaterally Motor  Motor Motor: Hemiplegia;Abnormal postural alignment and control;Abnormal tone Motor - Skilled Clinical Observations: generalized weaknes and R hemi/inattention  Mobility Bed Mobility Bed Mobility: Rolling Right;Rolling Left;Supine to Sit;Sit to Supine Rolling Right: Maximal Assistance - Patient 25-49% Rolling Left: Maximal Assistance - Patient 25-49% Supine to Sit: Maximal Assistance - Patient - Patient 25-49% Sit to Supine: 2 Helpers Transfers Transfers: Sit to Stand;Stand to Sit;Squat Pivot Transfers Sit to Stand: 2 Helpers Stand to Sit: 2 Helpers Squat Pivot Transfers: 2 Press photographer (Assistive device): None Locomotion  Gait Ambulation: No (limited by dense R hemiplegia and decreased activity tolerance) Gait Gait: No Stairs / Additional Locomotion Stairs: No Wheelchair Mobility Wheelchair  Mobility: No (limited by dense R hemiplegia and decreased activity tolerance)  Trunk/Postural Assessment  Cervical Assessment Cervical Assessment: Exceptions to [facility] (forward head) Thoracic Assessment Thoracic Assessment: Exceptions to [facility] (rounded shoulders) Lumbar Assessment Lumbar Assessment: Exceptions to [facility] (posterior pelvic tilt) Postural Control Postural Control: Deficits on evaluation Righting Reactions: delayed Protective Responses: delayed  Balance Balance Balance Assessed: Yes Static Sitting Balance Static Sitting - Balance Support: Left upper extremity supported;Feet unsupported Static Sitting - Level of Assistance: 3: Mod assist Dynamic Sitting Balance Dynamic Sitting - Balance Support: Left upper extremity supported;Feet unsupported Dynamic Sitting - Level of Assistance: 2: Max assist Static Standing Balance Static Standing - Balance Support: Left upper extremity supported Static Standing - Level of Assistance: 1: +2 Total assist Extremity Assessment  RLE Assessment RLE Assessment: Exceptions to [facility] Active Range of Motion (AROM) Comments: R inattention severely affects motor planning and voluntary movement General Strength Comments: No volitional movement noted during evaluation, noted mild adductor and extensor tone with MD assessment at end of eval LLE Assessment LLE Assessment: Exceptions to [facility] Active Range of Motion (AROM) Comments: Unable to follow cues for performing AROM General Strength Comments: Grossly at least 3+/5  with mobility, unable to formally test due to language and cognitive barriers    Refer to Care Plan for Long Term Goals  Recommendations for other services: Neuropsych and Therapeutic Recreation  Stress management  Discharge Criteria: Patient will be discharged from PT if patient refuses treatment 3 consecutive times without medical reason, if treatment goals not met, if there is a change in medical status, if patient makes no progress  towards goals or if patient is discharged from hospital.  The above assessment, treatment plan, treatment alternatives and goals were discussed and mutually agreed upon: by patient  Doreene Burke PT, DPT  06/03/2020, 3:52 PM

## 2020-06-03 NOTE — Progress Notes (Signed)
[place] PHYSICAL MEDICINE & REHABILITATION PROGRESS NOTE   Subjective/Complaints:  No issues overnite, interpreter, daught and PT  at bedsdie 2 person assist per PT for transfer  Poor intake discussed need to eat more, still has IV , po fluid recorded  O2 sat 100%   ROS- neg CP, SOB, N/V/D Objective:   No results found. Recent Labs    06/02/20 1633  WBC 5.9  HGB 13.0  HCT 41.6  PLT 230   Recent Labs    06/02/20 1633  CREATININE 1.03*    Intake/Output Summary (Last 24 hours) at 06/03/2020 0935 Last data filed at 06/02/2020 1800 Gross per 24 hour  Intake 100 ml  Output --  Net 100 ml     Physical Exam: Vital Signs Blood pressure (!) 181/88, pulse 94, temperature 98.2 F (36.8 C), resp. rate 18, SpO2 96 %.    General: No acute distress Mood and affect are appropriate Heart: Regular rate and rhythm no rubs murmurs or extra sounds Lungs: Clear to auscultation, breathing unlabored, no rales or wheezes Abdomen: Positive bowel sounds, soft nontender to palpation, nondistended Extremities: No clubbing, cyanosis, or edema Skin: No evidence of breakdown, no evidence of rash Neurologic: Cranial nerves II through XII intact, motor strength is 5/5 in left  deltoid, bicep, tricep, grip, hip flexor, knee extensors, ankle dorsiflexor and plantar flexor 2- RIght tricep, trace R grip, trace R hip/knee ext  Sensory exam normal sensation to light touch  in bilateral upper and lower extremities  Musculoskeletal: Full passive  range of motion in all 4 extremities. No joint swelling  Assessment/Plan: 1. Functional deficits secondary to Left pontine infarct  which require 3+ hours per day of interdisciplinary therapy in a comprehensive inpatient rehab setting.  Physiatrist is providing close team supervision and 24 hour management of active medical problems listed below.  Physiatrist and rehab team continue to assess barriers to discharge/monitor patient progress toward  functional and medical goals  Care Tool:  Bathing              Bathing assist       Upper Body Dressing/Undressing Upper body dressing        Upper body assist      Lower Body Dressing/Undressing Lower body dressing            Lower body assist       Toileting Toileting    Toileting assist Assist for toileting: Moderate Assistance - Patient 50 - 74%     Transfers Chair/bed transfer  Transfers assist           Locomotion Ambulation   Ambulation assist              Walk 10 feet activity   Assist           Walk 50 feet activity   Assist           Walk 150 feet activity   Assist           Walk 10 feet on uneven surface  activity   Assist           Wheelchair     Assist               Wheelchair 50 feet with 2 turns activity    Assist            Wheelchair 150 feet activity     Assist          Blood pressure Marland Kitchen)  181/88, pulse 94, temperature 98.2 F (36.8 C), resp. rate 18, SpO2 96 %.    Medical Problem List and Plan: 1.Left side side weakness and slurred speechsecondary to Acute left pontine infarction -patient may shower -ELOS/Goals: 21-25d, Min A ADL and Mobility  2. Antithrombotics: -DVT/anticoagulation:Lovenox -antiplatelet therapy: Aspirin 81 mg daily and Plavix 75 mg daily x3 weeks then aspirin alone 3. Pain Management:Tylenol as needed, has Musculoskeletal CP , sportscreme                           4. Mood:Provide emotional support -antipsychotic agents: N/A 5. Neuropsych: This patientiscapable of making decisions on herown behalf. 6. Skin/Wound Care:Routine skin checks 7. Fluids/Electrolytes/Nutrition:Routine in and outs with follow-up chemistries 8. Dysphagia. Dysphagia #2 nectar liquids. Follow-up speech therapy Poor intake, family will try rice pudding  9. Hyperlipidemia. Lipitor 10.  Hypertension  labile , check orthostatics , may need to initiate treatment  Vitals:   06/02/20 1920 06/03/20 0449  BP: 116/68 (!) 181/88  Pulse: 72 94  Resp: 16 18  Temp: 98.2 F (36.8 C) 98.2 F (36.8 C)  SpO2: 97% 96%   LOS: 1 days A FACE TO FACE EVALUATION WAS PERFORMED  Erick Colace 06/03/2020, 9:35 AM

## 2020-06-04 ENCOUNTER — Inpatient Hospital Stay (HOSPITAL_COMMUNITY): Payer: Medicare Other | Admitting: Occupational Therapy

## 2020-06-04 ENCOUNTER — Inpatient Hospital Stay (HOSPITAL_COMMUNITY): Payer: Medicare Other

## 2020-06-04 ENCOUNTER — Inpatient Hospital Stay (HOSPITAL_COMMUNITY): Payer: Medicare Other | Admitting: Physical Therapy

## 2020-06-04 DIAGNOSIS — N179 Acute kidney failure, unspecified: Secondary | ICD-10-CM

## 2020-06-04 DIAGNOSIS — I69391 Dysphagia following cerebral infarction: Secondary | ICD-10-CM

## 2020-06-04 DIAGNOSIS — R0989 Other specified symptoms and signs involving the circulatory and respiratory systems: Secondary | ICD-10-CM

## 2020-06-04 DIAGNOSIS — I10 Essential (primary) hypertension: Secondary | ICD-10-CM

## 2020-06-04 DIAGNOSIS — R0789 Other chest pain: Secondary | ICD-10-CM

## 2020-06-04 LAB — TROPONIN I (HIGH SENSITIVITY): Troponin I (High Sensitivity): 7 ng/L (ref <18)

## 2020-06-04 NOTE — H&P (Deleted)
Individualized overall Plan of Care [facility]) Patient Details Name: Ann Coleman MRN: [id number] DOB: Dec 23, 1923  Admitting Diagnosis: Brainstem infarct, acute [facility])  Hospital Problems: Principal Problem:   Brainstem infarct, acute (HCC) Active Problems:   Left pontine cerebrovascular accident (HCC)   AKI (acute kidney injury) (HCC)   Chest pressure   Essential hypertension   Labile blood pressure   Dysphagia, post-stroke     Functional Problem List: Nursing Bladder, Bowel, Edema, Endurance, Medication Management, Motor, Nutrition, Pain, Safety, Skin Integrity  PT Balance, Behavior, Edema, Endurance, Motor, Nutrition, Pain, Perception, Safety, Sensory, Skin Integrity  OT Balance, Safety, Endurance, Motor, Pain, Perception, Cognition, Vision  SLP Cognition  TR         Basic ADL's: OT Eating, Grooming, Bathing, Dressing, Toileting     Advanced  ADL's: OT       Transfers: PT Bed Mobility, Bed to Chair, Car, Furniture, Floor  OT Toilet, Tub/Shower     Locomotion: PT Ambulation, Stairs, Wheelchair Mobility     Additional Impairments: OT Fuctional Use of Upper Extremity  SLP Swallowing, Communication, Social Cognition expression Awareness  TR      Anticipated Outcomes Item Anticipated Outcome  Self Feeding (S)  Swallowing  Mod I   Basic self-care  min A  Toileting  min A   Bathroom Transfers min A  Bowel/Bladder  patient will be continent of bowel and bladder with min assist  Transfers  min A using LRAD  Locomotion  Supervision w/c  Communication  Supervision A  Cognition  Min A  Pain  pain less than or equal to 4/10 with min assist  Safety/Judgment  patient will be free from falls/injury and making appropriate safety decisions with min assist   Therapy Plan: PT Intensity: Minimum of 1-2 x/day ,45 to 90 minutes PT Frequency: 5 out of 7 days PT Duration Estimated Length of Stay: 3-4 weeks OT Intensity: Minimum of 1-2 x/day, 45 to 90 minutes OT Frequency:  5 out of 7 days OT Duration/Estimated Length of Stay: 3-4 weeks SLP Intensity: Minumum of 1-2 x/day, 30 to 90 minutes SLP Frequency: 3 to 5 out of 7 days SLP Duration/Estimated Length of Stay: 21-25 days    Team Interventions: Nursing Interventions Patient/Family Education, Bladder Management, Bowel Management, Disease Management/Prevention, Pain Management, Medication Management, Skin Care/Wound Management, Discharge Planning  PT interventions Ambulation/gait training, Community reintegration, DME/adaptive equipment instruction, Neuromuscular re-education, Psychosocial support, Stair training, UE/LE Strength taining/ROM, Wheelchair propulsion/positioning, Warden/ranger, Discharge planning, Functional electrical stimulation, Pain management, Skin care/wound management, Therapeutic Activities, UE/LE Coordination activities, Cognitive remediation/compensation, Disease management/prevention, Functional mobility training, Patient/family education, Splinting/orthotics, Therapeutic Exercise, Visual/perceptual remediation/compensation  OT Interventions Balance/vestibular training, Discharge planning, Functional electrical stimulation, Pain management, Self Care/advanced ADL retraining, Therapeutic Activities, UE/LE Coordination activities, Cognitive remediation/compensation, Disease mangement/prevention, Community reintegration, Functional mobility training, Patient/family education, Skin care/wound managment, Therapeutic Exercise, Wheelchair propulsion/positioning, UE/LE Strength taining/ROM, DME/adaptive equipment instruction, Neuromuscular re-education, Psychosocial support, Visual/perceptual remediation/compensation  SLP Interventions Cognitive remediation/compensation, Cueing hierarchy, Functional tasks, Internal/external aids, Dysphagia/aspiration precaution training, Patient/family education, Speech/Language facilitation  TR Interventions    SW/CM Interventions     Barriers to  Discharge MD  Medical stability, Weight, Behavior and Nutritional means  Nursing      PT Inaccessible home environment, Home environment access/layout, Behavior Patient has 2-3 STE home without rails, patient with new cognitive deficits limiting attention and safety weith mobility  OT      SLP      SW       Team Discharge Planning:  Destination: PT-Home ,OT- Home , SLP-Home Projected Follow-up: PT-Home health PT, OT-  Home health OT, SLP-Home Health SLP, Outpatient SLP, 24 hour supervision/assistance Projected Equipment Needs: PT- , OT- To be determined, SLP-None recommended by SLP Equipment Details: PT-Has SPC, will determine appropriate DME needs based on patient progress, OT-  Patient/family involved in discharge planning: PT- Patient, Family member/caregiver,  OT-Family member/caregiver, Patient, SLP-Patient  MD ELOS: 23-27 days. Medical Rehab Prognosis:  Good Assessment: 86 year old right-handed non-English-speaking [name] female with history of hypertension and hyperlipidemia as well as CVA 3 years ago. Presented 05/29/2020 with left-sided weakness and slurred speech. Noted systolic blood pressure in the 190s. 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 In retrospect of this lesion appeared to have been present on her prior MRI from 2018. 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 This did appear chronic and again noted on MRI 2018. Echocardiogram with ejection fraction of 75% no wall motion abnormalities.MRI of the brain was again completed 05/31/2020 due to increased left-sided weaknessshowing mild increase in size of small acute infarct of the left parasagittal pons.Admission chemistries BUN 5 creatinine 1.03, SARS Covid is  negative. She is on a dysphagia #2nectar thick liquid diet. Currently maintained on aspirin and Plavix for CVA prophylaxis x3 weeks and aspirin alone.  Patient with resulting functional deficits with mobility, transfers, self-care, swallowing, cognition.  We will set goals for min a with PT/OT and supervision with SLP.   Due to the current state of emergency, patients may not be receiving their 3-hours of Medicare-mandated therapy.  See Team Conference Notes for weekly updates to the plan of care

## 2020-06-04 NOTE — Progress Notes (Signed)
[place] PHYSICAL MEDICINE & REHABILITATION PROGRESS NOTE   Subjective/Complaints: Patient seen laying in bed this morning, working with therapies.  Interpreter as well as family at bedside.  Patient states she slept well overnight.  She appears to complain of chest pain, but then resolution, but then reoccurs.  ROS + chest pain.  Denies SOB, N/V/D, jaw pain, arm pain.  Objective:   No results found. Recent Labs    06/02/20 1633  WBC 5.9  HGB 13.0  HCT 41.6  PLT 230   Recent Labs    06/02/20 1633  CREATININE 1.03*    Intake/Output Summary (Last 24 hours) at 06/04/2020 1715 Last data filed at 06/04/2020 1300 Gross per 24 hour  Intake 168 ml  Output --  Net 168 ml     Physical Exam: Vital Signs Blood pressure (!) 156/67, pulse 83, temperature 98.2 F (36.8 C), resp. rate 18, SpO2 96 %. Constitutional: No distress . Vital signs reviewed. HENT: Normocephalic.  Atraumatic. Eyes: EOMI. No discharge. Cardiovascular: No JVD.  RRR. Respiratory: Normal effort.  No stridor. GI: Non-distended. Skin: Warm and dry.  Intact. Psych: Delays in processing with confusion Musc: No edema in extremities.  No tenderness in extremities.  Neuro: Alert Motor: RUE: Shoulder abduction 1+/5, elbow flexion/extension 1/5, distally 0/5 Right lower extremity: Hip flexion 1/5, distally 0/5  Assessment/Plan: 1. Functional deficits secondary to Left pontine infarct  which require 3+ hours per day of interdisciplinary therapy in a comprehensive inpatient rehab setting.  Physiatrist is providing close team supervision and 24 hour management of active medical problems listed below.  Physiatrist and rehab team continue to assess barriers to discharge/monitor patient progress toward functional and medical goals  Care Tool:  Bathing    Body parts bathed by patient: Left arm, Chest, Abdomen, Front perineal area, Right upper leg, Left upper leg, Face   Body parts bathed by helper: Right arm,  Buttocks, Right lower leg, Left lower leg     Bathing assist Assist Level: 2 Helpers     Upper Body Dressing/Undressing Upper body dressing   What is the patient wearing?: Pull over shirt    Upper body assist Assist Level: Maximal Assistance - Patient 25 - 49%    Lower Body Dressing/Undressing Lower body dressing      What is the patient wearing?: Underwear/pull up     Lower body assist Assist for lower body dressing: Maximal Assistance - Patient 25 - 49%     Toileting Toileting    Toileting assist Assist for toileting: Moderate Assistance - Patient 50 - 74%     Transfers Chair/bed transfer  Transfers assist     Chair/bed transfer assist level: 2 Helpers     Locomotion Ambulation   Ambulation assist   Ambulation activity did not occur: Safety/medical concerns (dense hemiplegia, decreased strength/activity tolerance)          Walk 10 feet activity   Assist  Walk 10 feet activity did not occur: Safety/medical concerns        Walk 50 feet activity   Assist Walk 50 feet with 2 turns activity did not occur: Safety/medical concerns         Walk 150 feet activity   Assist Walk 150 feet activity did not occur: Safety/medical concerns         Walk 10 feet on uneven surface  activity   Assist Walk 10 feet on uneven surfaces activity did not occur: Safety/medical concerns         Wheelchair  Assist Will patient use wheelchair at discharge?: Yes (Per PT long term goals ) Type of Wheelchair: Manual Wheelchair activity did not occur: Safety/medical concerns (decreased strength/activity tolerance)         Wheelchair 50 feet with 2 turns activity    Assist    Wheelchair 50 feet with 2 turns activity did not occur: Safety/medical concerns       Wheelchair 150 feet activity     Assist  Wheelchair 150 feet activity did not occur: Safety/medical concerns       Blood pressure (!) 156/67, pulse 83, temperature 98.2  F (36.8 C), resp. rate 18, SpO2 96 %.    Medical Problem List and Plan: 1.Left side side weakness and slurred speechsecondary to Acute left pontine infarction  Hold CIR until completion of cardiac work-up 2. Antithrombotics: -DVT/anticoagulation:Lovenox -antiplatelet therapy: Aspirin 81 mg daily and Plavix 75 mg daily x3 weeks then aspirin alone 3. Pain Management:Tylenol as needed, has Musculoskeletal CP , sportscreme                            4. Mood:Provide emotional support -antipsychotic agents: N/A 5. Neuropsych: This patientisnot fully capable of making decisions on herown behalf. 6. Skin/Wound Care:Routine skin checks 7. Fluids/Electrolytes/Nutrition:Routine in and outs with follow-up chemistries 8. Dysphagia. Dysphagia #2 nectar liquids. Follow-up speech therapy  P.o. intake variable 9. Hyperlipidemia. Lipitor 10.  Labile blood pressure  Labile on 7/5, orthostatics negative Vitals:   06/04/20 0611 06/04/20 1551  BP: 112/70 (!) 156/67  Pulse:  83  Resp:  18  Temp:  98.2 F (36.8 C)  SpO2:  96%  11.  AKI  Creatinine 1.03 on 7/3  Encourage fluids  Continue to monitor 12.?  Chest pain  ECG normal sinus rhythm, reassuring  Troponin negative  LOS: 2 days A FACE TO FACE EVALUATION WAS PERFORMED  Zelphia Glover Karis Juba 06/04/2020, 5:15 PM

## 2020-06-04 NOTE — Progress Notes (Signed)
Inpatient Rehabilitation Medication Review by a Pharmacist  A complete drug regimen review was completed for this patient to identify any potential clinically significant medication issues.  Clinically significant medication issues were identified:  Yes - resolved   Type of Medication Issue Identified Description of Issue Plan Plan Accepted by Provider? (Yes / No)  Drug Interaction(s) (clinically significant)      Duplicate Therapy      Allergy      No Medication Administration End Date   DAPT intended to be continued for 21 days then monotherapy with aspirin. No stop date included on clopidogrel Stop date entered for 06/20/20 Yes - adjusted per DC summary 06/02/20  Incorrect Dose      Additional Drug Therapy Needed      Other        Elmer Sow, PharmD, BCPS, BCCCP Clinical Pharmacist [phone]  Please check AMION for all [facility] numbers  06/04/2020 7:41 AM

## 2020-06-04 NOTE — Progress Notes (Signed)
Pt last voided at 2243, attempted for patient to void at 0634 but patient stated ( via translator) she could not urinate. Patient was advised of the need to in and out cath. Patient refused in and out cath and was educated on risk. No other concerns to report.  Raquell Richer, lpn

## 2020-06-04 NOTE — Progress Notes (Signed)
Inpatient Rehabilitation  Patient information reviewed and entered into eRehab system by Crystalmarie Yasin M. Danna Casella, M.A., CCC/SLP, PPS Coordinator.  Information including medical coding, functional ability and quality indicators will be reviewed and updated through discharge.    

## 2020-06-04 NOTE — Progress Notes (Signed)
Occupational Therapy Session Note  Patient Details  Name: Oleda Douty MRN: [id number] Date of Birth: 03-19-1924  Today's Date: 06/04/2020 OT Individual Time: 1415-1445 OT Individual Time Calculation (min): 30 min    Short Term Goals: Week 1:  OT Short Term Goal 1 (Week 1): Pt will don UB clothing with mod A OT Short Term Goal 2 (Week 1): Pt will sit EOB for ADL with no more than mod A for sitting balance OT Short Term Goal 3 (Week 1): Pt will demonstrate improved R attention with no more than mod cueing for scanning during ADL task OT Short Term Goal 4 (Week 1): Pt and caregiver will correctly position her RUE at rest with min A  Skilled Therapeutic Interventions/Progress Updates:    Pt supine in bed with dtr and interpreter present upon OT arrival.  Pt complaining of tightness in chest, fatigue, and difficulty breathing.  OT assessed vitals with pt in supine:  BP: 129/69 mm Hg, pulse 88bpm, O2 on room air 92%.  Pt agreeable to sit EOB with OT requiring max assist supine to sit.  Pt provided TCs to facilitate weightbearing for proprioceptive input RUE.  TCs provided upper traps to facilitate shoulder shrug x 10 reps.  OT positioned pts RUE in gravity eliminated position for biceps and triceps AAROM x 10 reps.  Pt requesting to return to supine and self initiating needing max assist.  TD + 2 to reposition towards HOB.  Dr. Allena Katz arrived at this time to complete pt assessment.  Per MD orders, hold further OT until further clearance, therefore discontinued OT session.  Call bell in reach, bed alarm on.    Therapy Documentation Precautions:  Precautions Precautions: Fall Precaution Comments: R hemiplegia Restrictions Weight Bearing Restrictions: No   Therapy/Group: Individual Therapy  Amie Critchley 06/04/2020, 3:34 PM

## 2020-06-04 NOTE — Evaluation (Signed)
Speech Language Pathology Assessment and Plan  Patient Details  Name: Ann Coleman MRN: [id number] Date of Birth: 02-Apr-1924  SLP Diagnosis: Aphasia;Dysarthria;Cognitive Impairments;Speech and Language deficits  Rehab Potential: Good ELOS: 21-25 days    Today's Date: 06/04/2020 SLP Individual Time: 0805-0900 SLP Individual Time Calculation (min): 55 min   Hospital Problem: Principal Problem:   Brainstem infarct, acute ([place]) Active Problems:   Left pontine cerebrovascular accident [facility])  Past Medical History:  Past Medical History:  Diagnosis Date  . High cholesterol   . Hypertension   . Stroke [facility])    Past Surgical History:  Past Surgical History:  Procedure Laterality Date  . NO PAST SURGERIES      Assessment / Plan / Recommendation Clinical Impression Patient is a 86 y.o. year old right-handed non-English-speaking [place] female with history of hypertension and hyperlipidemia as well as CVA 3 years ago. Per chart review lives with her children. 1 level home 2 steps to entry. Reportedly independent prior to admission using a walker and assistance from her daughter. Presented 05/29/2020 with left-sided weakness and slurred speech. Noted systolic blood pressure in the 190s. 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 In retrospect of this lesion appeared to have been present on her prior MRI from 2018. 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 This did appear chronic and again noted on MRI 2018. Echocardiogram with ejection fraction of 75% no wall motion abnormalities.MRI of the brain was again completed 05/31/2020 due to increased left-sided weaknessshowing mild increase in size of small acute infarct of the left parasagittal  pons.Admission chemistries BUN 5 creatinine 1.03, SARS Covid is negative. She is on a dysphagia #2nectar thick liquid diet. Currently maintained on aspirin and Plavix for CVA prophylaxis x3 weeks and aspirin alone. Subcutaneous Lovenox for DVT prophylaxis. Therapy evaluations completed with recommendations of physical medicine rehab consult. Patient was admitted for a comprehensive rehab program. Patient transferred to CIR on 06/02/2020 .    Pt demonstarted mild oropharyngeal dysphagia during BSS. Pt presented with lingual deviation to right side (CN 12), reduced ROM on right side (CN 7) and a weak cough. Pt is edentulous and supports baseline consumption of dys 3/regular textures, stating she ate "chips." Pt consumed thin liquids via cup, with large sips noted immediate cough, however with controled sips via straw (due to positioning in bed) no further s/s aspiration was noted. SLP initiated water protocol for small sips of thin liquid via cup/straw and will monitor s/s aspiration as well as vital signs to upgrade to thin liquids at bedside. Pt's most recent MBS on 05/31/2020 noted trace aspiration of thin liquids with large bolus sizes. Pt consumed dys 3 and dys 1 textures, refusing dys 2 textures. Pt demonstrated prolonged mastication on dys 3 textures requiring liquid wash and then following consumption of dys 1 to clear oral cavity. Consumption of dys 1 textures resulted in appropriate clearance of oral cavity and swallow appeared timely. SLP recommends continuing diet of dys 2 textures and nectar thick liquids with full supervision. Pt demonstrated mild dysarthria at the phrase/senetnce level. Pt was unable to rate speech intelligibility, however the interpretor has been with pt over the past few days and rated speech 70% intelligible due to misarticulations. Pt provided inconsistent verbal responses (sometimes not responding and other times  responding quickly/accurately), noting likely word finding  deficits. Pt stated " thinking and speech are hard," when questioned about current deficits. Pt demonstrated intellectual awareness of acute deficits, however comprehension of the impact of deficits during functional task is unknown. Informal cognitive linguistic assessment was administered, however due to majority of time spent on repositioning/waiting for assistance and swallow assessment further cognitive linguistic assessment is needed. Pt was able to follow basic commands and was orientated to self, place and situation (not time). Reduced awareness of right arm was also noted. Pt would benefit from skills ST services in order to maximize functional independence and reduce burden of care, requring 24 hour supervision and continued ST services.   Skilled Therapeutic Interventions          Skilled ST services focused on swallow skills. Interpretor was present. Pt had a cup of thin liquid (water) in bed upon entering room,. Pt's son's was present and SLP provided education pertaining to need for thicken liquids. Continued education is likely needed, Pt's son answered a phone call and left the room, not returning during the treatment session. SLP facilitated BSS, provided education on current deficits and plan for treatment. SLP initiated water protocol and provided education. All questions answered to satisfaction. SLP notified NT to provide assistance with PO consumption of breakfast tray, dys 2 and nectar thick liquids and provided education to check for right buccal pocketing. Pt was left in room with call bell within reach and bed alarm set. ST recommends to continue skilled ST services.   SLP Assessment  Patient will need skilled Speech Lanaguage Pathology Services during CIR admission    Recommendations  SLP Diet Recommendations: Dysphagia 2 (Fine chop);Nectar Liquid Administration via: Cup;Straw Medication Administration: Whole meds with puree Supervision: Patient able to self feed;Full  supervision/cueing for compensatory strategies Compensations: Slow rate;Small sips/bites;Lingual sweep for clearance of pocketing;Multiple dry swallows after each bite/sip Postural Changes and/or Swallow Maneuvers: Seated upright 90 degrees Oral Care Recommendations: Oral care BID Patient destination: Home Follow up Recommendations: Home Health SLP;Outpatient SLP;24 hour supervision/assistance Equipment Recommended: None recommended by SLP    SLP Frequency 3 to 5 out of 7 days   SLP Duration  SLP Intensity  SLP Treatment/Interventions 21-25 days  Minumum of 1-2 x/day, 30 to 90 minutes  Cognitive remediation/compensation;Cueing hierarchy;Functional tasks;Internal/external aids;Dysphagia/aspiration precaution training;Patient/family education;Speech/Language facilitation    Pain Pain Assessment Pain Scale: 0-10 Pain Score: 0-No pain  Prior Functioning Cognitive/Linguistic Baseline: Information not available Type of Home: House  Lives With: Daughter Available Help at Discharge: Family  SLP Evaluation Cognition Overall Cognitive Status: Impaired/Different from baseline Arousal/Alertness: Awake/alert Attention: Sustained Sustained Attention: Impaired Memory: Impaired Awareness: Impaired Awareness Impairment: Emergent impairment;Intellectual impairment Safety/Judgment: Impaired  Comprehension Auditory Comprehension Overall Auditory Comprehension: Appears within functional limits for tasks assessed (following simple commands) Expression Expression Primary Mode of Expression: Verbal Verbal Expression Overall Verbal Expression: Impaired Initiation: No impairment Automatic Speech: Name;Social Response Level of Generative/Spontaneous Verbalization: Phrase;Sentence Pragmatics: Impairment Written Expression Dominant Hand: Right Oral Motor Oral Motor/Sensory Function Overall Oral Motor/Sensory Function: Moderate impairment Facial ROM: Reduced right;Suspected CN VII (facial)  dysfunction Facial Symmetry: Abnormal symmetry right;Suspected CN VII (facial) dysfunction Facial Strength: Reduced right;Suspected CN VII (facial) dysfunction Lingual ROM: Within Functional Limits Lingual Symmetry: Abnormal symmetry right Lingual Strength: Within Functional Limits Lingual Sensation: Within Functional Limits Motor Speech Overall Motor Speech: Impaired Respiration: Within functional limits Phonation: Normal Resonance: Within functional limits Articulation: Impaired Level of Impairment: Phrase Intelligibility: Intelligibility reduced Word: 75-100% accurate Phrase: 50-74% accurate (70%)  Motor Planning: Witnin functional limits Motor Speech Errors: Consistent;Aware   PMSV Assessment  PMSV Trial Intelligibility: Intelligibility reduced Word: 75-100% accurate Phrase: 50-74% accurate (70%)  Bedside Swallowing Assessment General Previous Swallow Assessment: 7/1 MBS dys 2 and nectar thick liquids, sensed trace aspiration on thin Diet Prior to this Study: Dysphagia 2 (chopped);Nectar-thick liquids Respiratory Status: Room air History of Recent Intubation: No Behavior/Cognition: Cooperative;Pleasant mood Oral Cavity - Dentition: Edentulous Self-Feeding Abilities: Able to feed self Patient Positioning: Upright in bed Baseline Vocal Quality: Normal Volitional Cough: Weak Volitional Swallow: Able to elicit  Oral Care Assessment Does patient have any of the following "high(er) risk" factors?: None of the above Does patient have any of the following "at risk" factors?: Other - dysphagia Patient is LOW RISK: Follow universal precautions (see row information) Ice Chips   Thin Liquid Thin Liquid: Impaired Presentation: Cup;Self Fed;Straw Pharyngeal  Phase Impairments: Suspected delayed Swallow;Cough - Immediate Other Comments: cough with large sips only Nectar Thick Presentation: Cup Honey Thick Honey Thick Liquid: Not tested Puree Puree: Within functional  limits Presentation: Spoon;Self Fed Solid Solid: Impaired Presentation: Self Fed Oral Phase Functional Implications: Prolonged oral transit;Impaired mastication;Oral holding Other Comments: dys 3 textures listed impairments above, dys 2 no impairment noted BSE Assessment Risk for Aspiration Impact on safety and function: Mild aspiration risk  Short Term Goals: Week 1: SLP Short Term Goal 1 (Week 1): Pt will participate in continued cognitive lingutistic assesment. SLP Short Term Goal 2 (Week 1): Pt will consume trials of thin liquids with no overt s/s aspriation over x3 treatment sessions and min A verbal cues for use of small sips and intermittent dry swallow prior to liquid upgrade. SLP Short Term Goal 3 (Week 1): Pt consume trials of dys 3 textures with no overt s/s aspiration over x3 treatment sessions with min A verbal cues for oral clearance. SLP Short Term Goal 4 (Week 1): Pt will express wants/needs at phrase/sentence level with min A verbal cues. SLP Short Term Goal 5 (Week 1): Pt will demonstrate 80% intelligibility at the phrase/sentence level with min A verbal cues.  Refer to Care Plan for Long Term Goals  Recommendations for other services: None   Discharge Criteria: Patient will be discharged from SLP if patient refuses treatment 3 consecutive times without medical reason, if treatment goals not met, if there is a change in medical status, if patient makes no progress towards goals or if patient is discharged from hospital.  The above assessment, treatment plan, treatment alternatives and goals were discussed and mutually agreed upon: by patient  Kimani Hovis  [facility] 06/04/2020, 3:43 PM

## 2020-06-04 NOTE — Progress Notes (Signed)
Per MD, hold further skilled therapy, due to pt presenting with tightness in chest, SOB, and some confusion.  Resume when cleared by MD.

## 2020-06-04 NOTE — Progress Notes (Signed)
Pt refused scheduled cath x2. Patient stated " I am too sleepy to do anything right now". Will attempt to cath again after lunch. Leane Para, LPN

## 2020-06-04 NOTE — Progress Notes (Signed)
Physical Therapy Session Note  Patient Details  Name: Ann Coleman MRN: [id number] Date of Birth: 02-10-1924  Today's Date: 06/04/2020 PT Individual Time: 1005-1100 PT Individual Time Calculation (min): 55 min   Short Term Goals: Week 1:  PT Short Term Goal 1 (Week 1): Patient to be able to perform all bed mobility with modAx1 and use of bed features PT Short Term Goal 2 (Week 1): Patient to be able to complete sliding board transfers with MaxAx1 and standby of second person for safety PT Short Term Goal 3 (Week 1): Patient to tolerate standing for 3 minutes in standing frame PT Short Term Goal 4 (Week 1): Patient to initiate gait training  Skilled Therapeutic Interventions/Progress Updates:    patient received in bed after having tried to use bed pain; live [place] interpreter present and facilitated communication this morning. Able to roll side to side with modA however otherwise needed 2 helpers at [facility] for supine<->sit and sliding board transfers to and from [facility]. Demonstrates poor core activation and proprioception in sitting, also poor ability to activate core musculature and decreased awareness of deficits. Per interpreter, she is a bit functionally confused as well. Spent most of session working on functional transfers, sitting balance and reaching/cross midline reaches in WC, and AAROM of R UE/LE. Also made adjustment to TIS leg rests to account for her leg length. She requested to use BSC at EOS however neither PT or nurse tech were able to stay with her (unsafe to sit on [facility] without direct assist), so she was returned to bed for use of bed pan via sliding board transfer. Left in bed with NT present and attending, all other needs met this morning.   Therapy Documentation Precautions:  Precautions Precautions: Fall Precaution Comments: R hemiplegia Restrictions Weight Bearing Restrictions: No Pain: Pain Assessment Pain Scale: 0-10 Pain Score: 0-No pain    Therapy/Group: Individual  Therapy   Windell Norfolk, DPT, PN1   Supplemental Physical Therapist [place]    Pager [phone] Acute Rehab Office [phone]   06/04/2020, 12:25 PM

## 2020-06-04 NOTE — Plan of Care (Signed)
  Problem: Consults Goal: RH STROKE PATIENT EDUCATION Description: See Patient Education module for education specifics  Outcome: Progressing Goal: Nutrition Consult-if indicated Outcome: Progressing   Problem: RH BOWEL ELIMINATION Goal: RH STG MANAGE BOWEL WITH ASSISTANCE Description: STG Manage Bowel with Assistance. Outcome: Progressing Goal: RH STG MANAGE BOWEL W/MEDICATION W/ASSISTANCE Description: STG Manage Bowel with Medication with Assistance. Outcome: Progressing   Problem: RH BLADDER ELIMINATION Goal: RH STG MANAGE BLADDER WITH ASSISTANCE Description: STG Manage Bladder With Assistance Outcome: Progressing Goal: RH STG MANAGE BLADDER WITH MEDICATION WITH ASSISTANCE Description: STG Manage Bladder With Medication With Assistance. Outcome: Progressing Goal: RH STG MANAGE BLADDER WITH EQUIPMENT WITH ASSISTANCE Description: STG Manage Bladder With Equipment With Assistance Outcome: Progressing   Problem: RH SKIN INTEGRITY Goal: RH STG MAINTAIN SKIN INTEGRITY WITH ASSISTANCE Description: STG Maintain Skin Integrity With Assistance. Outcome: Progressing Goal: RH STG ABLE TO PERFORM INCISION/WOUND CARE W/ASSISTANCE Description: STG Able To Perform Incision/Wound Care With Assistance. Outcome: Progressing   Problem: RH SAFETY Goal: RH STG ADHERE TO SAFETY PRECAUTIONS W/ASSISTANCE/DEVICE Description: STG Adhere to Safety Precautions With Assistance/Device. Outcome: Progressing Goal: RH STG DECREASED RISK OF FALL WITH ASSISTANCE Description: STG Decreased Risk of Fall With Assistance. Outcome: Progressing   Problem: RH COGNITION-NURSING Goal: RH STG USES MEMORY AIDS/STRATEGIES W/ASSIST TO PROBLEM SOLVE Description: STG Uses Memory Aids/Strategies With Assistance to Problem Solve. Outcome: Progressing Goal: RH STG ANTICIPATES NEEDS/CALLS FOR ASSIST W/ASSIST/CUES Description: STG Anticipates Needs/Calls for Assist With Assistance/Cues. Outcome: Progressing   Problem:  RH PAIN MANAGEMENT Goal: RH STG PAIN MANAGED AT OR BELOW PT'S PAIN GOAL Outcome: Progressing

## 2020-06-05 ENCOUNTER — Inpatient Hospital Stay (HOSPITAL_COMMUNITY): Payer: Medicare Other | Admitting: Physical Therapy

## 2020-06-05 ENCOUNTER — Inpatient Hospital Stay (HOSPITAL_COMMUNITY): Payer: Medicare Other | Admitting: Occupational Therapy

## 2020-06-05 ENCOUNTER — Inpatient Hospital Stay (HOSPITAL_COMMUNITY): Payer: Medicare Other | Admitting: Speech Pathology

## 2020-06-05 DIAGNOSIS — I6350 Cerebral infarction due to unspecified occlusion or stenosis of unspecified cerebral artery: Secondary | ICD-10-CM

## 2020-06-05 NOTE — Progress Notes (Signed)
Occupational Therapy Session Note  Patient Details  Name: Maila Plant MRN: [id number] Date of Birth: 01-03-1924  Today's Date: 06/05/2020 OT Individual Time: 1497-0263 OT Individual Time Calculation (min): 60 min    Short Term Goals: Week 1:  OT Short Term Goal 1 (Week 1): Pt will don UB clothing with mod A OT Short Term Goal 2 (Week 1): Pt will sit EOB for ADL with no more than mod A for sitting balance OT Short Term Goal 3 (Week 1): Pt will demonstrate improved R attention with no more than mod cueing for scanning during ADL task OT Short Term Goal 4 (Week 1): Pt and caregiver will correctly position her RUE at rest with min A  Skilled Therapeutic Interventions/Progress Updates:    Pt in bed asleep with dtr at bedside, interpreter present to assist with communication throughout session.  Pt needing moderate encouragement, but agreeable to OT session.  No c/o pain throughout session.  Pt completed supine to sit with max assist.  Sliding board transfer EOB to w/c completed with +2 (pt 25%).  Dependent transport to small gym completed.  Pt performed sliding board transfer w/c to EOM with +2.  Pt sat EOM with CGA and participated in neuro re-ed of RUE including weightbearing task through RUE with LUE reaching across midline to grasp horseshoe and place on left side.  Pt needing min intermittent VCs to attend to right side during task.  Pt completed AAROM shoulder scaption and FF/ext to push pull large weightless ball.  Pt also completed elbow flex/ext AAROM using weightless dowel with ace wrap to support right grip.  Pt reporting her back is starting to hurt and wants to get back in bed.  Pt completed sliding board transfer EOM to w/c with +2 assist and transported back to room.  Upon entry to room pt requesting to wash hands at sink.  Pt required hand over hand technique RUE to turn on/off faucet and wash hands.  Noted shoulder and elbow activation during task.  Pt completed sliding board transfer with +2  w/c to bed.  Call bell in reach, bed alarm on.  Increased AROM noted in right shoulder today.  Improved tolerance to activity compared to yesterday as well.  Therapy Documentation Precautions:  Precautions Precautions: Fall Precaution Comments: R hemiplegia Restrictions Weight Bearing Restrictions: No   Therapy/Group: Individual Therapy  Amie Critchley 06/05/2020, 12:58 PM

## 2020-06-05 NOTE — Progress Notes (Signed)
Speech Language Pathology Daily Session Note  Patient Details  Name: Margaux Moultrie MRN: [id number] Date of Birth: [birth year]  Today's Date: 06/05/2020 SLP Individual Time: 2094-7096 SLP Individual Time Calculation (min): 45 min  Short Term Goals: Week 1: SLP Short Term Goal 1 (Week 1): Pt will participate in continued cognitive lingutistic assesment. SLP Short Term Goal 2 (Week 1): Pt will consume trials of thin liquids with no overt s/s aspriation over x3 treatment sessions and min A verbal cues for use of small sips and intermittent dry swallow prior to liquid upgrade. SLP Short Term Goal 3 (Week 1): Pt consume trials of dys 3 textures with no overt s/s aspiration over x3 treatment sessions with min A verbal cues for oral clearance. SLP Short Term Goal 4 (Week 1): Pt will express wants/needs at phrase/sentence level with min A verbal cues. SLP Short Term Goal 5 (Week 1): Pt will demonstrate 80% intelligibility at the phrase/sentence level with min A verbal cues.  Skilled Therapeutic Interventions: Pt was seen for skilled ST targeting cognitive skills. [name] interpretor present to facilitate functional communication in pt's preferred language. SLP facilitated session with basic to mildly complex functional tasks for further diagnostic treatment of pt's cognitive skills, during which she required only 1 verbal cue for error awareness when organizing a 1X daily pill organizer. Pt reports she feels she is at baseline level of cognitive function with mild memory deficits. Pt's son present at beginning of session, but not present to confirm of deny cognitive changes. Pt became frustrated with questions from SLP during interview to assess most functional tasks to target while inpatient. Of note, pt's interpretor reported pt's verbal expression is vastly improved today in comparison to previous encounters; no instances of word finding noted and per interpretor, pt was 95% intelligible in conversation even in  the absence of cues for intelligibility strategies. Pt became increasingly lethargic and disengaged in ST session; closing her eyes and unable to participate further, therefore session ended 15 mins early. Would recommend focus on functional emergent awareness and swallowing goals. Pt left in bed with alarm on and needs within reach, interpretor still present. Continue per current plan of care.     Pain Pain Assessment Pain Scale: Faces Faces Pain Scale: Hurts a little bit Pain Type: Acute pain Pain Location: Leg Pain Orientation: Right Pain Descriptors / Indicators: Aching Pain Onset: Gradual Pain Intervention(s): Repositioned;RN made aware Multiple Pain Sites: No  Therapy/Group: Individual Therapy  Little Ishikawa 06/05/2020, 7:02 AM

## 2020-06-05 NOTE — Progress Notes (Signed)
Inpatient Rehabilitation Care Coordinator Assessment and Plan  Patient Details  Name: Ann Coleman MRN: [id number] Date of Birth: 01-18-1924  Today's Date: 06/05/2020  Problem List:  Patient Active Problem List   Diagnosis Date Noted  . AKI (acute kidney injury) (HCC)   . Chest pressure   . Essential hypertension   . Labile blood pressure   . Dysphagia, post-stroke   . Brainstem infarct, acute (HCC) 06/02/2020  . Left pontine cerebrovascular accident (HCC) 06/02/2020  . Acute CVA (cerebrovascular accident) (HCC) 05/29/2020  . HLD (hyperlipidemia) 05/29/2020  . Lacunar infarction (HCC) 05/10/2017  . Stroke (HCC) 03/05/2017  . Acute right MCA stroke (HCC) 03/04/2017  . Hypertensive urgency 03/04/2017  . Stroke (cerebrum) (HCC) 03/04/2017   Past Medical History:  Past Medical History:  Diagnosis Date  . High cholesterol   . Hypertension   . Stroke [facility])    Past Surgical History:  Past Surgical History:  Procedure Laterality Date  . NO PAST SURGERIES     Social History:  reports that she has never smoked. She has never used smokeless tobacco. She reports that she does not drink alcohol and does not use drugs.  Family / Support Systems Marital Status: Single Children: 2 Daughters and 2 Sons Anticipated Caregiver: [name] (daughter) Ability/Limitations of Caregiver: Works at night 7AM-7PM Caregiver Availability: Intermittent  Social History Preferred language: English Religion:  Cultural Background: Never worked Read: No Write: No   Abuse/Neglect Abuse/Neglect Assessment Can Be Completed: Yes Physical Abuse: Denies Verbal Abuse: Denies Sexual Abuse: Denies Exploitation of patient/patient's resources: Denies Self-Neglect: Denies  Emotional Status Pt's affect, behavior and adjustment status: no Recent Psychosocial Issues: no Psychiatric History: no Substance Abuse History: no  Patient / Family Perceptions, Expectations & Goals Pt/Family understanding of illness &  functional limitations: yes Pt/family expectations/goals: Goal to discharge back home  [employer] Agencies: None Premorbid Home Care/DME Agencies: None Transportation available at discharge: daughter able to transport  Discharge Planning Living Arrangements: Children (Going home with daughter) Support Systems: Children Type of Residence: Private residence (1 level home, 1 step to enter front door) Civil engineer, contracting: [employer] Living Expenses: Lives with family Money Management: Family Care Coordinator Anticipated Follow Up Needs: HH/OP  Clinical Impression Sw entered room. DTR at bedside. Introduced self, explained role and process. Sw will continue to follow up with questions and concerns.  Andria Rhein 06/05/2020, 2:34 PM

## 2020-06-05 NOTE — Progress Notes (Signed)
[facility] Individual Statement of Services  Patient Name:  Ann Coleman  Date:  06/05/2020  Welcome to the [facility]patient [facility].  Our goal is to provide you with an individualized program based on your diagnosis and situation, designed to meet your specific needs.  With this comprehensive rehabilitation program, you will be expected to participate in at least 3 hours of rehabilitation therapies Monday-Friday, with modified therapy programming on the weekends.  Your rehabilitation program will include the following services:  Physical Therapy (PT), Occupational Therapy (OT), Speech Therapy (ST), 24 hour per day rehabilitation nursing, Therapeutic Recreaction (TR), Neuropsychology, Care Coordinator, Rehabilitation Medicine, Nutrition Services, Pharmacy Services and Other  Weekly team conferences will be held on Wednesdays to discuss your progress.  Your Inpatient Rehabilitation Care Coordinator will talk with you frequently to get your input and to update you on team discussions.  Team conferences with you and your family in attendance may also be held.  Expected length of stay: 10-14 Days  Overall anticipated outcome: Min A  Depending on your progress and recovery, your program may change. Your Inpatient Rehabilitation Care Coordinator will coordinate services and will keep you informed of any changes. Your Inpatient Rehabilitation Care Coordinator's name and contact numbers are listed  below.  The following services may also be recommended but are not provided by the Inpatient [facility]:    Home Health Rehabiltiation Services  Outpatient Rehabilitation Services    Arrangements will be made to provide these services after discharge if needed.  Arrangements include referral to agencies that provide these services.  Your insurance has been verified to be:  Medicare Your primary doctor is:  NO PCP  Pertinent information will be shared with your doctor  and your insurance company.  Inpatient Rehabilitation Care Coordinator:  Lavera Guise, [place] [phone] or [phone]  Information discussed with and copy given to patient by: Andria Rhein, 06/05/2020, 11:03 AM

## 2020-06-05 NOTE — Care Management (Signed)
Patient ID: Ann Coleman, female   DOB: Jan 04, 1924, 86 y.o.   MRN: [id number] Met with patient's daughter to review role of CM and collaboration with SW Margreta Journey) to facilitate preparation for discharge. Reviewed secondary stroke risks and rationale for management of HTN, HLD and aortic atherosclerosis. Discussed DASH diet, cooking with less salt, less processed foods and DAPT. Also noted urinary retention, currently requiring I+O caths and reviewed possible need to educate daughter on procedure for discharge and reason for urinary retention related to location of stroke (pons). Noted constipation; reviewed prompted toileting and resumption of home schedule with prn medications. Reported understanding of information reviewed and availability to review questions after review of handouts. Continue to follow for nursing issues.

## 2020-06-05 NOTE — IPOC Note (Signed)
Individualized overall Plan of Care (IPOC) Patient Details Name: Ann Coleman MRN: [id number] DOB: 12/23/1923  Admitting Diagnosis: Brainstem infarct, acute (HCC)  Hospital Problems: Principal Problem:   Brainstem infarct, acute (HCC) Active Problems:   Left pontine cerebrovascular accident (HCC)   AKI (acute kidney injury) (HCC)   Chest pressure   Essential hypertension   Labile blood pressure   Dysphagia, post-stroke     Functional Problem List: Nursing Bladder, Bowel, Edema, Endurance, Medication Management, Motor, Nutrition, Pain, Safety, Skin Integrity  PT Balance, Behavior, Edema, Endurance, Motor, Nutrition, Pain, Perception, Safety, Sensory, Skin Integrity  OT Balance, Safety, Endurance, Motor, Pain, Perception, Cognition, Vision  SLP Cognition  TR         Basic ADL's: OT Eating, Grooming, Bathing, Dressing, Toileting     Advanced  ADL's: OT       Transfers: PT Bed Mobility, Bed to Chair, Car, Furniture, Floor  OT Toilet, Tub/Shower     Locomotion: PT Ambulation, Stairs, Wheelchair Mobility     Additional Impairments: OT Fuctional Use of Upper Extremity  SLP Swallowing, Communication, Social Cognition expression Awareness  TR      Anticipated Outcomes Item Anticipated Outcome  Self Feeding (S)  Swallowing  Mod I   Basic self-care  min A  Toileting  min A   Bathroom Transfers min A  Bowel/Bladder  patient will be continent of bowel and bladder with min assist  Transfers  min A using LRAD  Locomotion  Supervision w/c  Communication  Supervision A  Cognition  Min A  Pain  pain less than or equal to 4/10 with min assist  Safety/Judgment  patient will be free from falls/injury and making appropriate safety decisions with min assist   Therapy Plan: PT Intensity: Minimum of 1-2 x/day ,45 to 90 minutes PT Frequency: 5 out of 7 days PT Duration Estimated Length of Stay: 3-4 weeks OT Intensity: Minimum of 1-2 x/day, 45 to 90 minutes OT Frequency:  5 out of 7 days OT Duration/Estimated Length of Stay: 3-4 weeks SLP Intensity: Minumum of 1-2 x/day, 30 to 90 minutes SLP Frequency: 3 to 5 out of 7 days SLP Duration/Estimated Length of Stay: 21-25 days    Team Interventions: Nursing Interventions Patient/Family Education, Bladder Management, Bowel Management, Disease Management/Prevention, Pain Management, Medication Management, Skin Care/Wound Management, Discharge Planning  PT interventions Ambulation/gait training, Community reintegration, DME/adaptive equipment instruction, Neuromuscular re-education, Psychosocial support, Stair training, UE/LE Strength taining/ROM, Wheelchair propulsion/positioning, Balance/vestibular training, Discharge planning, Functional electrical stimulation, Pain management, Skin care/wound management, Therapeutic Activities, UE/LE Coordination activities, Cognitive remediation/compensation, Disease management/prevention, Functional mobility training, Patient/family education, Splinting/orthotics, Therapeutic Exercise, Visual/perceptual remediation/compensation  OT Interventions Balance/vestibular training, Discharge planning, Functional electrical stimulation, Pain management, Self Care/advanced ADL retraining, Therapeutic Activities, UE/LE Coordination activities, Cognitive remediation/compensation, Disease mangement/prevention, Community reintegration, Functional mobility training, Patient/family education, Skin care/wound managment, Therapeutic Exercise, Wheelchair propulsion/positioning, UE/LE Strength taining/ROM, DME/adaptive equipment instruction, Neuromuscular re-education, Psychosocial support, Visual/perceptual remediation/compensation  SLP Interventions Cognitive remediation/compensation, Cueing hierarchy, Functional tasks, Internal/external aids, Dysphagia/aspiration precaution training, Patient/family education, Speech/Language facilitation  TR Interventions    SW/CM Interventions     Barriers to  Discharge MD  Medical stability, Weight, Behavior and Nutritional means  Nursing      PT Inaccessible home environment, Home environment access/layout, Behavior Patient has 2-3 STE home without rails, patient with new cognitive deficits limiting attention and safety weith mobility  OT      SLP      SW       Team Discharge Planning:   Destination: PT-Home ,OT- Home , SLP-Home Projected Follow-up: PT-Home health PT, OT-  Home health OT, SLP-Home Health SLP, Outpatient SLP, 24 hour supervision/assistance Projected Equipment Needs: PT- , OT- To be determined, SLP-None recommended by SLP Equipment Details: PT-Has SPC, will determine appropriate DME needs based on patient progress, OT-  Patient/family involved in discharge planning: PT- Patient, Family member/caregiver,  OT-Family member/caregiver, Patient, SLP-Patient  MD ELOS: 23-27 days. Medical Rehab Prognosis:  Good Assessment: 86-year-old right-handed non-English-speaking [place] female with history of hypertension and hyperlipidemia as well as CVA 3 years ago. Presented 05/29/2020 with left-sided weakness and slurred speech. Noted systolic blood pressure in the 190s. 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 In retrospect of this lesion appeared to have been present on her prior MRI from 2018. 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 This did appear chronic and again noted on MRI 2018. Echocardiogram with ejection fraction of 75% no wall motion abnormalities.MRI of the brain was again completed 05/31/2020 due to increased left-sided weaknessshowing mild increase in size of small acute infarct of the left parasagittal pons.Admission chemistries BUN 5 creatinine 1.03, SARS Covid is  negative. She is on a dysphagia #2nectar thick liquid diet. Currently maintained on aspirin and Plavix for CVA prophylaxis x3 weeks and aspirin alone.  Patient with resulting functional deficits with mobility, transfers, self-care, swallowing, cognition.  We will set goals for min a with PT/OT and supervision with SLP.   Due to the current state of emergency, patients may not be receiving their 3-hours of Medicare-mandated therapy.  See Team Conference Notes for weekly updates to the plan of care   

## 2020-06-05 NOTE — Progress Notes (Signed)
[facility] PHYSICAL MEDICINE & REHABILITATION PROGRESS NOTE   Subjective/Complaints: Patient seen sitting up in bed this morning.  Family at bedside, who translates.  Patient slept well overnight.  She wants to go home.  ROS denies chest pain, SOB, N/V/D.  Objective:   No results found. Recent Labs    06/02/20 1633  WBC 5.9  HGB 13.0  HCT 41.6  PLT 230   Recent Labs    06/02/20 1633  CREATININE 1.03*    Intake/Output Summary (Last 24 hours) at 06/05/2020 0859 Last data filed at 06/05/2020 0545 Gross per 24 hour  Intake 218 ml  Output --  Net 218 ml     Physical Exam: Vital Signs Blood pressure (!) 143/69, pulse 96, temperature 98.8 F (37.1 C), temperature source Oral, resp. rate 19, SpO2 92 %. Constitutional: No distress . Vital signs reviewed. HENT: Normocephalic.  Atraumatic. Eyes: EOMI. No discharge. Cardiovascular: No JVD.  RRR. Respiratory: Normal effort.  No stridor.  Bilaterally clear to auscultation. GI: Non-distended. Skin: Warm and dry.  Intact. Psych: Flat.  Delays in processing. Musc: No edema in extremities.  No tenderness in extremities. Neuro: Alert Motor: RUE: Shoulder abduction 2/5, elbow flexion/extension 2 -/5, distally 0/5 Right lower extremity: Hip flexion 1/5, distally 0/5  Assessment/Plan: 1. Functional deficits secondary to Left pontine infarct  which require 3+ hours per day of interdisciplinary therapy in a comprehensive inpatient rehab setting.  Physiatrist is providing close team supervision and 24 hour management of active medical problems listed below.  Physiatrist and rehab team continue to assess barriers to discharge/monitor patient progress toward functional and medical goals  Care Tool:  Bathing    Body parts bathed by patient: Left arm, Chest, Abdomen, Front perineal area, Right upper leg, Left upper leg, Face   Body parts bathed by helper: Right arm, Buttocks, Right lower leg, Left lower leg     Bathing assist Assist  Level: 2 Helpers     Upper Body Dressing/Undressing Upper body dressing   What is the patient wearing?: Pull over shirt    Upper body assist Assist Level: Maximal Assistance - Patient 25 - 49%    Lower Body Dressing/Undressing Lower body dressing      What is the patient wearing?: Underwear/pull up     Lower body assist Assist for lower body dressing: Maximal Assistance - Patient 25 - 49%     Toileting Toileting    Toileting assist Assist for toileting: Maximal Assistance - Patient 25 - 49%     Transfers Chair/bed transfer  Transfers assist     Chair/bed transfer assist level: 2 Helpers     Locomotion Ambulation   Ambulation assist   Ambulation activity did not occur: Safety/medical concerns (dense hemiplegia, decreased strength/activity tolerance)          Walk 10 feet activity   Assist  Walk 10 feet activity did not occur: Safety/medical concerns        Walk 50 feet activity   Assist Walk 50 feet with 2 turns activity did not occur: Safety/medical concerns         Walk 150 feet activity   Assist Walk 150 feet activity did not occur: Safety/medical concerns         Walk 10 feet on uneven surface  activity   Assist Walk 10 feet on uneven surfaces activity did not occur: Safety/medical concerns         Wheelchair     Assist Will patient use wheelchair at discharge?: Yes (  Per PT long term goals ) Type of Wheelchair: Manual Wheelchair activity did not occur: Safety/medical concerns (decreased strength/activity tolerance)         Wheelchair 50 feet with 2 turns activity    Assist    Wheelchair 50 feet with 2 turns activity did not occur: Safety/medical concerns       Wheelchair 150 feet activity     Assist  Wheelchair 150 feet activity did not occur: Safety/medical concerns       Blood pressure (!) 143/69, pulse 96, temperature 98.8 F (37.1 C), temperature source Oral, resp. rate 19, SpO2 92 %.     Medical Problem List and Plan: 1.Left side hemiparesis and slurred speech with confusionsecondary to Acute left pontine infarction  Continue CIR 2. Antithrombotics: -DVT/anticoagulation:Lovenox -antiplatelet therapy: Aspirin 81 mg daily and Plavix 75 mg daily x3 weeks then aspirin alone 3. Pain Management:Tylenol as needed, has Musculoskeletal CP , sportscreme                            4. Mood:Provide emotional support -antipsychotic agents: N/A 5. Neuropsych: This patientisnot fully capable of making decisions on herown behalf. 6. Skin/Wound Care:Routine skin checks 7. Fluids/Electrolytes/Nutrition:Routine in and outs with follow-up chemistries 8. Dysphagia. Dysphagia #2 nectar liquids. Follow-up speech therapy  P.o. intake fair 9. Hyperlipidemia. Lipitor 10.  Labile blood pressure  Relatively controlled on 7/6  Borderline orthostasis Vitals:   06/04/20 1938 06/05/20 0419  BP: 137/84 (!) 143/69  Pulse: 92 96  Resp: 19 19  Temp: 98 F (36.7 C) 98.8 F (37.1 C)  SpO2: 92% 92%  11.  AKI  Creatinine 1.03 on 7/3  Continue to encourage fluids  Continue to monitor 12.?  Chest pain: Resolved  ECG normal sinus rhythm, reassuring  Troponin negative  LOS: 3 days A FACE TO FACE EVALUATION WAS PERFORMED  Marqus Macphee Karis Juba 06/05/2020, 8:59 AM

## 2020-06-05 NOTE — Progress Notes (Signed)
Physical Therapy Session Note  Patient Details  Name: Ann Coleman MRN: [id number] Date of Birth: July 19, 1924  Today's Date: 06/05/2020 PT Individual Time: 1002-1052 PT Individual Time Calculation (min): 50 min   Short Term Goals: Week 1:  PT Short Term Goal 1 (Week 1): Patient to be able to perform all bed mobility with modAx1 and use of bed features PT Short Term Goal 2 (Week 1): Patient to be able to complete sliding board transfers with MaxAx1 and standby of second person for safety PT Short Term Goal 3 (Week 1): Patient to tolerate standing for 3 minutes in standing frame PT Short Term Goal 4 (Week 1): Patient to initiate gait training  Skilled Therapeutic Interventions/Progress Updates:    Patient received in bed, live [place] interpreter present and facilitated communication this session. Able to roll side to side for donning paper scrub pants with MinA, however continues to require MaxAx2 for supine<->sit as well as sliding board transfers and totalAx2 for sit to stand. Worked on multiple sliding board transfers today with patient with significant difficulty with sequencing, and on sliding board transfer back to bed she actually tried to lay down sideways on the board mid-transfer requiring totalAx2 to prevent her from sliding off board to the floor. Able to sit midline for approximately 5 minutes with MinAx1-2  But difficulty with carryover. Once back in bed this morning, she said "finished" and refused to participate further in therapy this morning. She did report some possible "chest pain" but interpreter unable to truly determine if was true chest pain or just difficulty breathing in standing but this appeared to resolve with return to sitting, patient without signs of distress and VSS.  Left in bed per RN request with all needs met, bed alarm active.   Therapy Documentation Precautions:  Precautions Precautions: Fall Precaution Comments: R hemiplegia Restrictions Weight Bearing  Restrictions: No General: PT Amount of Missed Time (min): 10 Minutes PT Missed Treatment Reason: Patient unwilling to participate;Patient fatigue Vital Signs:  Pain: Pain Assessment Pain Scale: Faces Pain Score: 0-No pain     Therapy/Group: Individual Therapy   Windell Norfolk, DPT, PN1   Supplemental Physical Therapist [place]    Pager [phone] Acute Rehab Office [phone]    06/05/2020, 12:55 PM

## 2020-06-05 NOTE — Progress Notes (Signed)
Patient encouraged to void this morning. Pt refused and family stated she cannot void. Family also stated patient does not want to be cathed this morning. Patient educated and encouraged to either void or participate in in and out cathing. No other concerns to report.

## 2020-06-06 ENCOUNTER — Inpatient Hospital Stay (HOSPITAL_COMMUNITY): Payer: Medicare Other | Admitting: Speech Pathology

## 2020-06-06 ENCOUNTER — Inpatient Hospital Stay (HOSPITAL_COMMUNITY): Payer: Medicare Other | Admitting: Physical Therapy

## 2020-06-06 ENCOUNTER — Inpatient Hospital Stay (HOSPITAL_COMMUNITY): Payer: Medicare Other | Admitting: Occupational Therapy

## 2020-06-06 DIAGNOSIS — M25562 Pain in left knee: Secondary | ICD-10-CM

## 2020-06-06 DIAGNOSIS — G8929 Other chronic pain: Secondary | ICD-10-CM

## 2020-06-06 DIAGNOSIS — M25561 Pain in right knee: Secondary | ICD-10-CM

## 2020-06-06 DIAGNOSIS — G8191 Hemiplegia, unspecified affecting right dominant side: Secondary | ICD-10-CM

## 2020-06-06 MED ORDER — DICLOFENAC SODIUM 1 % EX GEL
2.0000 g | Freq: Four times a day (QID) | CUTANEOUS | Status: DC
  Administered 2020-06-06 – 2020-06-22 (×57): 2 g via TOPICAL
  Filled 2020-06-06 (×2): qty 100

## 2020-06-06 NOTE — Progress Notes (Signed)
Occupational Therapy Session Note  Patient Details  Name: Ann Coleman MRN: [id number] Date of Birth: July 06, 1924  Today's Date: 06/06/2020 OT Individual Time: 1300-1417 OT Individual Time Calculation (min): 77 min    Short Term Goals: Week 1:  OT Short Term Goal 1 (Week 1): Pt will don UB clothing with mod A OT Short Term Goal 2 (Week 1): Pt will sit EOB for ADL with no more than mod A for sitting balance OT Short Term Goal 3 (Week 1): Pt will demonstrate improved R attention with no more than mod cueing for scanning during ADL task OT Short Term Goal 4 (Week 1): Pt and caregiver will correctly position her RUE at rest with min A  Skilled Therapeutic Interventions/Progress Updates:    Pt sidelying in bed, interpretor present for communication assistance.  Pt c/o low back pain intermittently during session; reports she received Tylenol for pain management.  Pt completed supine to sit with max assist.  Sliding board transfer EOB to w/c with +2 (pt 25 %).  Pt repositioned with max assist with TCs and VCs provided to facilitate pt weight shifting technique.    Pt bathed UB with mod assist for right UE and back and donned button down shirt with education on hemitechnique with max assist needed to complete.  Pt attempted sit to stand at sink with +2 (pt 25%) however pt with difficulty coming into full upright stance reporting low back pain as a barrier.  Pt returned to sitting and dependent transport to small gym completed.    Pt completed squat pivot transfer w/c to EOM with + 2 assist (pt 25%).  Pt sat EOM with CGA without UE support.  Neuro re-ed completed to RUE including weightbearing, AROM with TCs/VCs to facilitate muscle contraction and visual feedback using mirror to complete shoulder shrugs, shoulder scaption with elbow fully flexed, biceps/triceps in gravity eliminated plane, wrist extension, and forearm supination.  Noted trace muscle contraction wrist extensors and forearm supinators.  Pt  completed squat pivot transfer EOM to w/c with +2 assist.   Pt returned to room and requesting to toilet.  Squat pivot w/c to EOB and sit to supine completed with +2 assist.  Max assist to place bedpan.  Nurse notified of pts status, call bell in reach, bed alarm on.  Therapy Documentation Precautions:  Precautions Precautions: Fall Precaution Comments: R hemiplegia Restrictions Weight Bearing Restrictions: No   Therapy/Group: Individual Therapy  Amie Critchley 06/06/2020, 3:53 PM

## 2020-06-06 NOTE — Progress Notes (Signed)
Patient ID: Ann Coleman, female   DOB: 05-28-1924, 86 y.o.   MRN: [id number] Team Conference Report to Patient/Family  Team Conference discussion was reviewed with the patient and caregiver, including goals, any changes in plan of care and target discharge date.  Patient and caregiver express understanding and are in agreement.  The patient has a target discharge date of  (TBD).  Andria Rhein 06/06/2020, 2:51 PM

## 2020-06-06 NOTE — Patient Care Conference (Signed)
Inpatient RehabilitationTeam Conference and Plan of Care Update Date: 06/06/2020   Time: 2:57 PM    Patient Name: Ann Coleman      Medical Record Number: [id number]  Date of Birth: September 27, 1924 Sex: Female         Room/Bed: 4W25C/4W25C-01 Payor Info: Payor: MEDICARE / Plan: MEDICARE PART A AND B / Product Type: *No Product type* /    Admit Date/Time:  06/02/2020  3:56 PM  Primary Diagnosis:  Brainstem infarct, acute [facility])  Hospital Problems: Principal Problem:   Brainstem infarct, acute (HCC) Active Problems:   Left pontine cerebrovascular accident (HCC)   AKI (acute kidney injury) (HCC)   Chest pressure   Essential hypertension   Labile blood pressure   Dysphagia, post-stroke   Chronic pain of both knees   Right hemiparesis [facility])    Expected Discharge Date: Expected Discharge Date:  (TBD)  Team Members Present: Physician leading conference: Dr. Maryla Morrow Care Coodinator Present: Chana Bode, RN, BSN, CRRN;Christina Garfield, BSW Nurse Present: Margot Ables, LPN PT Present: Nedra Hai, PT OT Present: Earleen Newport, OT SLP Present: Suzzette Righter, CF-SLP PPS Coordinator present : Edson Snowball, [place] Breed, SLP     Current Status/Progress Goal Weekly Team Focus  Bowel/Bladder             Swallow/Nutrition/ Hydration   Dysphagia 2 textures, nectar thick liquids, water protocol  Mod I  carryover safe swallow strategies, trials advanced solids and liquids   ADL's   +2 assist for functional transfers; max assist to total dependence for ADLs  min assist  functional transfer training, RUE neuro re-ed and strengthening, sitting balance   Mobility   Rolling minA with bed rails, sit<->supine maxAx1-2, sliding board transfers maxAx2, sit to stand totalAX2; limited by confusion, chest pain, basically zero safety awareness  Min-Mod assist, WC supervision goals  basic transfers and bed moblity, sitting balance, tolerance to OOB , safety   Communication   Supervision-Min   Supervision  Carryover intelligibility and word-finding strategies in converstaion/functional communication exchanges   Safety/Cognition/ Behavioral Observations  Min-Mod A  Min A  emergent awareness, functional problem solving, speak with family about baseline   Pain             Skin               Team Discussion:Left knee pain, poor safety awareness and limited use of right UE impairs progress.  Discharge Planning/Teaching Needs:  Goal to discharge back home with DTR  Will scedule witb dtr if recc.   Current Update: on target  Current Barriers to Discharge:  Decreased caregiver support and Incontinence  Possible Resolutions to Barriers: SW to confirm assistance available when daughter is at work.  Timed toileting/prompting for urinary incontinence  Patient on target to meet rehab goals: yes, currently max assist + 2 for transfers with a lot of tone on right side.  *See Care Plan and progress notes for long and short-term goals.   Revisions to Treatment Plan:  Dysphagia 3 trials with thin liquids with SLP    Medical Summary Current Status: Left side hemiparesis and slurred speech with confusion secondary to Acute left pontine infarction Weekly Focus/Goal: Improve mobility, swallowing, AKI, BP, b/l knee pain  Barriers to Discharge: Medical stability;Weight;Decreased family/caregiver support;Home enviroment access/layout   Possible Resolutions to Barriers: Therapies, optimize pain meds, advance diet as tolerated, follow vitals, encourage fluids, follow labs   Continued Need for Acute Rehabilitation Level of Care: The patient requires daily medical management by  a physician with specialized training in physical medicine and rehabilitation for the following reasons: Direction of a multidisciplinary physical rehabilitation program to maximize functional independence : Yes Medical management of patient stability for increased activity during participation in an intensive  rehabilitation regime.: Yes Analysis of laboratory values and/or radiology reports with any subsequent need for medication adjustment and/or medical intervention. : Yes   I attest that I was present, lead the team conference, and concur with the assessment and plan of the team.   Chana Bode B 06/06/2020, 2:57 PM

## 2020-06-06 NOTE — Progress Notes (Signed)
Speech Language Pathology Daily Session Note  Patient Details  Name: Ann Coleman MRN: [id number] Date of Birth: Dec 16, 1923  Today's Date: 06/06/2020 SLP Individual Time: 0804-0900 SLP Individual Time Calculation (min): 56 min  Short Term Goals: Week 1: SLP Short Term Goal 1 (Week 1): Pt will participate in continued cognitive lingutistic assesment. SLP Short Term Goal 2 (Week 1): Pt will consume trials of thin liquids with no overt s/s aspriation over x3 treatment sessions and min A verbal cues for use of small sips and intermittent dry swallow prior to liquid upgrade. SLP Short Term Goal 3 (Week 1): Pt consume trials of dys 3 textures with no overt s/s aspiration over x3 treatment sessions with min A verbal cues for oral clearance. SLP Short Term Goal 4 (Week 1): Pt will express wants/needs at phrase/sentence level with min A verbal cues. SLP Short Term Goal 5 (Week 1): Pt will demonstrate 80% intelligibility at the phrase/sentence level with min A verbal cues.  Skilled Therapeutic Interventions: Pt was seen for skilled ST targeting dysphagia and communication goals. Interpretor and pt's daughter present for session. Pt's daughter confirms pt is at baseline level of cognitive function, and speech intelligibility rating from both interpretor and daughter is 90% in conversation. Pt utilized increased vocal intensity Mod I during session, and only 1 verbal cue was required from to repeat message with overarticulation. After completing oral care with toothbrush, pt accepted upgraded trials of thin H2O via cup without overt s/sx aspiration, despite inconsistent use of extra dry swallow strategy, which was recommended in last MBSS  (05/31/20). Pt did exhibit multiple swallows, however good oral control, and piecemeal swallow appeared intentional as way for pt to compensate/achieve greatest oral control of thin boluses. During trial of Dysphagia 3 (mech soft) solid, pt's mastication was impaired due to edentulous  status; pt's daughter reports pt usually mashes crackers in similar manner to presentation today, however due to increase in respirations/work of breathing during Dysphagia 3 solid, would recommend continue current diet. Given that speech and cognitive goals have been met, ST will focus on dysphagia interventions moving forward. Pt left sitting in bed with alarm set, RN present to administer pain medication, interpretor and daughter also still present. Continue per current plan of care.        Pain Pain Assessment Pain Scale: Faces Faces Pain Scale: Hurts a little bit Pain Type: Acute pain Pain Location: Arm Pain Orientation: Right Pain Descriptors / Indicators: Grimacing;Discomfort Pain Frequency: Constant Pain Onset: On-going Patients Stated Pain Goal: 1 Pain Intervention(s): RN made aware Multiple Pain Sites: No  Therapy/Group: Individual Therapy  Arbutus Leas 06/06/2020, 7:27 AM

## 2020-06-06 NOTE — Progress Notes (Addendum)
[place] PHYSICAL MEDICINE & REHABILITATION PROGRESS NOTE   Subjective/Complaints: Patient seen laying in bed this AM.  She indicates she slept well overnight, family interprets. She complains of chest pain and knee pain.   ROS: +Chest pain, bilateral knee pain. Denies chest pain, SOB, N/V/D.  Objective:   No results found. No results for input(s): WBC, HGB, HCT, PLT in the last 72 hours. No results for input(s): NA, K, CL, CO2, GLUCOSE, BUN, CREATININE, CALCIUM in the last 72 hours.  Intake/Output Summary (Last 24 hours) at 06/06/2020 0832 Last data filed at 06/06/2020 0529 Gross per 24 hour  Intake 100 ml  Output 250 ml  Net -150 ml     Physical Exam: Vital Signs Blood pressure (!) 143/78, pulse 90, temperature 98.3 F (36.8 C), resp. rate 19, SpO2 94 %. Constitutional: No distress . Vital signs reviewed. HENT: Normocephalic.  Atraumatic. Eyes: EOMI. No discharge. Cardiovascular: No JVD. RRR.  Respiratory: Normal effort.  No stridor. Bilaterally clear to ausculation. GI: Non-distended. Skin: Warm and dry.  Intact. Psych: Flat. Delays.  Musc: No edema in extremities.  No tenderness in extremities. Neuro: Alert Motor: RUE: Shoulder abduction 2/5, elbow flexion/extension 2 -/5, distally 0/5, stable Right lower extremity: Hip flexion 1/5, distally 0/5  Assessment/Plan: 1. Functional deficits secondary to Left pontine infarct  which require 3+ hours per day of interdisciplinary therapy in a comprehensive inpatient rehab setting.  Physiatrist is providing close team supervision and 24 hour management of active medical problems listed below.  Physiatrist and rehab team continue to assess barriers to discharge/monitor patient progress toward functional and medical goals  Care Tool:  Bathing    Body parts bathed by patient: Left arm, Chest, Abdomen, Front perineal area, Right upper leg, Left upper leg, Face   Body parts bathed by helper: Right arm, Buttocks, Right lower  leg, Left lower leg     Bathing assist Assist Level: 2 Helpers     Upper Body Dressing/Undressing Upper body dressing   What is the patient wearing?: Pull over shirt    Upper body assist Assist Level: Maximal Assistance - Patient 25 - 49%    Lower Body Dressing/Undressing Lower body dressing      What is the patient wearing?: Underwear/pull up     Lower body assist Assist for lower body dressing: Maximal Assistance - Patient 25 - 49%     Toileting Toileting    Toileting assist Assist for toileting: Maximal Assistance - Patient 25 - 49%     Transfers Chair/bed transfer  Transfers assist     Chair/bed transfer assist level: 2 Helpers     Locomotion Ambulation   Ambulation assist   Ambulation activity did not occur: Safety/medical concerns (dense hemiplegia, decreased strength/activity tolerance)          Walk 10 feet activity   Assist  Walk 10 feet activity did not occur: Safety/medical concerns        Walk 50 feet activity   Assist Walk 50 feet with 2 turns activity did not occur: Safety/medical concerns         Walk 150 feet activity   Assist Walk 150 feet activity did not occur: Safety/medical concerns         Walk 10 feet on uneven surface  activity   Assist Walk 10 feet on uneven surfaces activity did not occur: Safety/medical concerns         Wheelchair     Assist Will patient use wheelchair at discharge?: Yes (Per PT  long term goals ) Type of Wheelchair: Manual Wheelchair activity did not occur: Safety/medical concerns (decreased strength/activity tolerance)         Wheelchair 50 feet with 2 turns activity    Assist    Wheelchair 50 feet with 2 turns activity did not occur: Safety/medical concerns       Wheelchair 150 feet activity     Assist  Wheelchair 150 feet activity did not occur: Safety/medical concerns       Blood pressure (!) 143/78, pulse 90, temperature 98.3 F (36.8 C), resp.  rate 19, SpO2 94 %.    Medical Problem List and Plan: 1.Right side hemiparesis and slurred speech with confusionsecondary to Acute left pontine infarction  Continue CIR  Team conference today to discuss current and goals and coordination of care, home and environmental barriers, and discharge planning with nursing, case manager, and therapies.  2. Antithrombotics: -DVT/anticoagulation:Lovenox -antiplatelet therapy: Aspirin 81 mg daily and Plavix 75 mg daily x3 weeks then aspirin alone 3. Pain Management:Tylenol as needed, has Musculoskeletal CP (work-up has been negative for cardiogenic source), sportscreme  Voltaren gel ordered to bilateral knees 4. Mood:Provide emotional support -antipsychotic agents: N/A 5. Neuropsych: This patientisnot fully capable of making decisions on herown behalf. 6. Skin/Wound Care:Routine skin checks 7. Fluids/Electrolytes/Nutrition:Routine in and outs with follow-up chemistries 8. Dysphagia. Dysphagia #2 nectar liquids. Follow-up speech therapy  P.o. intake improving 9. Hyperlipidemia. Lipitor 10.  Labile blood pressure  Relatively controlled on 7/7 Vitals:   06/05/20 2020 06/06/20 0520  BP:  (!) 143/78  Pulse:  90  Resp: 19 19  Temp: 98.5 F (36.9 C) 98.3 F (36.8 C)  SpO2: 94% 94%  11.  AKI  Creatinine 1.03 on 7/3, plan order labs for later this week  Continue to encourage fluids  Continue to monitor  LOS: 4 days A FACE TO FACE EVALUATION WAS PERFORMED  Lianny Molter Karis Juba 06/06/2020, 8:32 AM

## 2020-06-06 NOTE — Progress Notes (Signed)
Physical Therapy Session Note  Patient Details  Name: Ann Coleman MRN: [id number] Date of Birth: [birth year]  Today's Date: 06/06/2020 PT Individual Time: 1003-1059 PT Individual Time Calculation (min): 56 min   Short Term Goals: Week 1:  PT Short Term Goal 1 (Week 1): Patient to be able to perform all bed mobility with modAx1 and use of bed features PT Short Term Goal 2 (Week 1): Patient to be able to complete sliding board transfers with MaxAx1 and standby of second person for safety PT Short Term Goal 3 (Week 1): Patient to tolerate standing for 3 minutes in standing frame PT Short Term Goal 4 (Week 1): Patient to initiate gait training  Skilled Therapeutic Interventions/Progress Updates: Pt presents left side-lying in bed w/ interpreter in room.  Resident reluctantly agrees to therapy.  Pt required total A to thread LEs through pants.  Pt encouraged to pull pants up from hook-lying position and does perform minimal bridging to attempt pulling up past hips w/ left UE, but requires rolling side to side w/ min A to complete task by therapy staff.  Pt performed log roll technique to transfer side-lying to sit w/ assist of 2 w/ verbal directions and interpreter.  Pt sat EOB working on midline balance w/ and w/o LUE support.  Pt performed slide board transfer w/ assist of 2 to total A of 1 after left down to elbow to place slide board.  Pt performed multiple SB transfers to/from mat table.  Pt sat EOM w/ facilitation at right elbow for weight shifting side to side and then to midline.  Pt returned to room and performed squat pivot w/c > bed w/ total A of 1.  Pt returned to supine w/ total A.  Pt performed HS bilateral LEs, AROM left, AAROM/PROM right LE .  Bed alarm on and all needs in reach, interpreter remained in room.     Therapy Documentation Precautions:  Precautions Precautions: Fall Precaution Comments: R hemiplegia Restrictions Weight Bearing Restrictions: No General:   Vital  Signs:  Pain:0/10 Pain Assessment Pain Scale: Faces Faces Pain Scale: Hurts a little bit Pain Type: Acute pain Pain Location: Arm Pain Orientation: Right Pain Descriptors / Indicators: Grimacing;Discomfort Pain Frequency: Constant Pain Onset: On-going Patients Stated Pain Goal: 1 Pain Intervention(s): RN made aware Multiple Pain Sites: No Mobility:   Locomotion :    Trunk/Postural Assessment :    Balance:   Exercises:   Other Treatments:      Therapy/Group: Individual Therapy  Lucio Edward 06/06/2020, 11:03 AM

## 2020-06-07 ENCOUNTER — Inpatient Hospital Stay (HOSPITAL_COMMUNITY): Payer: Medicare Other | Admitting: Physical Therapy

## 2020-06-07 ENCOUNTER — Inpatient Hospital Stay (HOSPITAL_COMMUNITY): Payer: Medicare Other | Admitting: Speech Pathology

## 2020-06-07 ENCOUNTER — Inpatient Hospital Stay (HOSPITAL_COMMUNITY): Payer: Medicare Other | Admitting: Occupational Therapy

## 2020-06-07 ENCOUNTER — Inpatient Hospital Stay (HOSPITAL_COMMUNITY): Payer: Medicare Other

## 2020-06-07 NOTE — Progress Notes (Signed)
Physical Therapy Session Note  Patient Details  Name: Ann Coleman MRN: [id number] Date of Birth: 1924-01-24  Today's Date: 06/07/2020 PT Individual Time: 0800-0910 PT Individual Time Calculation (min): 70 min   Short Term Goals: Week 1:  PT Short Term Goal 1 (Week 1): Patient to be able to perform all bed mobility with modAx1 and use of bed features PT Short Term Goal 2 (Week 1): Patient to be able to complete sliding board transfers with MaxAx1 and standby of second person for safety PT Short Term Goal 3 (Week 1): Patient to tolerate standing for 3 minutes in standing frame PT Short Term Goal 4 (Week 1): Patient to initiate gait training  Skilled Therapeutic Interventions/Progress Updates:    Patient received in bed, [place] interpreter present and facilitated communication this session. Able to get to EOB with MaxAx1/MinA of second person, and needed MinA to maintain sitting balance at EOB; able to perform multiple squat pivot transfers to/from [facility] with MaxAx2 today and actually good initiation/participation this session! Spent most of session working on sitting balance (needed fairly consistent MinA) as well as lateral leans onto R and L elbows with tactile cues for lateral trunk activation for back to upright, forward reaching for cones with Mod facilitation from PT, and trunk rotation at edge of mat table with mod facilitation from therapist. Attempted to install R lap tray on TIS WC but unable to get it to fit arm-rest so applied full lap tray due to poor trunk control and awareness of R UE. Education for R UE management provided during session. She was left up in TIS [facility] with chair alarm active, all needs met and interpreter present. RN informed about increased respiratory wheezing during session.   Therapy Documentation Precautions:  Precautions Precautions: Fall Precaution Comments: R hemiplegia Restrictions Weight Bearing Restrictions: No    Pain: Pain Assessment Pain Scale:  Faces Faces Pain Scale: Hurts a little bit Pain Type: Acute pain Pain Location: Back Pain Orientation: Lower Pain Descriptors / Indicators: Discomfort Pain Onset: On-going Patients Stated Pain Goal: 0 Pain Intervention(s): Repositioned;Ambulation/increased activity    Therapy/Group: Individual Therapy   Windell Norfolk, DPT, PN1   Supplemental Physical Therapist [place]    Pager [phone] Acute Rehab Office [phone]    06/07/2020, 12:07 PM

## 2020-06-07 NOTE — Progress Notes (Signed)
Occupational Therapy Session Note  Patient Details  Name: Ann Coleman MRN: [id number] Date of Birth: 09-19-1924  Today's Date: 06/07/2020 OT Individual Time: 2836-6294 OT Individual Time Calculation (min): 68 min    Short Term Goals: Week 1:  OT Short Term Goal 1 (Week 1): Pt will don UB clothing with mod A OT Short Term Goal 2 (Week 1): Pt will sit EOB for ADL with no more than mod A for sitting balance OT Short Term Goal 3 (Week 1): Pt will demonstrate improved R attention with no more than mod cueing for scanning during ADL task OT Short Term Goal 4 (Week 1): Pt and caregiver will correctly position her RUE at rest with min A  Skilled Therapeutic Interventions/Progress Updates:    Pt supine in bed, interpreter and dtr present; rehab tech present throughout session for assist as needed.  Pt completed rolling to left with mod assist, sidelying to sit with max assist and max VCs to facilitate pushing with LUE.  Squat pivot transfer completed with max assist and min assist from second helper EOB to w/c.  Pt transported to sink to complete UB bathing and dressing.  Pt needing frequent intermittent VCs to attend to RUE for increased active functional movement.  Pt able to abduct right shoulder in order to wash under arm with VCs.  Pt provided hand over hand to RUE to wash left side of upper body.  Pt donned shirt with max assist, however pt with slightly increased functional reach noted RUE to thread sleeve.  Pt transported to gym for rest of session.  Pt instructed through seated tableslides using RUE to complete sh FF and sh extension and provided target to reach for external goal.  Pt needing max TCs and VCs to reduce compensatory movements at trunk however pt able actively move shoulder as well.  Pt completed squat pivot transfer w/c to EOM with max assist and min assist from second helper.  Pt participated in neuro re-ed weightbearing into RUE with manual support at shoulder and elbow with functional  reach crossing midline and outside BOS using LUE needing CGA for balance.  Pt needing intermittent VCs to redirect due to moderate distractibility throughout intervention in gym setting.  Pt able to refocus with cueing for short durations.  Pt participated in AAROM sitting EOM RUE on exercise ball to push and pull x 10 reps.  Pt completed squat pivot transfer similar to previously mentioned EOM to w/c and w/c to EOB.  Pt completed sit to supine with max assist +2 with improved body mechanics noted today.  Call bell in reach, bed alarm on.  Improved right shoulder AROM abduction noted today.  Also increased pt participation during squat pivot transfers during initial ascent.  Improved scanning and attending to right side, however still neglecting RUE quite a bit.  Therapy Documentation Precautions:  Precautions Precautions: Fall Precaution Comments: R hemiplegia Restrictions Weight Bearing Restrictions: No   Therapy/Group: Individual Therapy  Amie Critchley 06/07/2020, 3:57 PM

## 2020-06-07 NOTE — Progress Notes (Signed)
Upon tech entering the room the patient's daughter was standing beside the bed feeding the patient what looked like a chicken stew with rice. Tech explained that was not allowed, nor apart of patient's diet. Patient's family member stated "I know what to feed my mom". Tech came and approached nurse, nurse entered room to patient's daughter feeding her the stew, which had a water consistency, and explained to the patient's family member that was dangerous and could cause aspiration pneumonia if patient does not tolerate. All thin liquids thrown out of room that were brought in. Patient's family member stated "yeah" and continued to feed patient. Will reach out to speech therapy to attempt to educate patient's family members and also asking for doctor to address with family if they are in the room next time.

## 2020-06-07 NOTE — Progress Notes (Signed)
Orthopedic Tech Progress Note Patient Details:  Ann Coleman 26-May-1924 [id number] Ordered outside vendor brace Patient ID: Ann Coleman, female   DOB: [birth year], 86 y.o.   MRN: [id number]   [name] 06/07/2020, 12:59 PM

## 2020-06-07 NOTE — Progress Notes (Signed)
Physical Therapy Session Note  Patient Details  Name: Ann Coleman MRN: [id number] Date of Birth: [birth year]  Today's Date: 06/07/2020 PT Individual Time: 1030-1056 PT Individual Time Calculation (min): 26 min   Short Term Goals: Week 1:  PT Short Term Goal 1 (Week 1): Patient to be able to perform all bed mobility with modAx1 and use of bed features PT Short Term Goal 2 (Week 1): Patient to be able to complete sliding board transfers with MaxAx1 and standby of second person for safety PT Short Term Goal 3 (Week 1): Patient to tolerate standing for 3 minutes in standing frame PT Short Term Goal 4 (Week 1): Patient to initiate gait training  Skilled Therapeutic Interventions/Progress Updates:     Patient in w/c with SLP and interpreter in the room upon PT arrival. Patient alert and agreeable to PT session. Patient denied pain during session, however, pulling at lap tray stating that she needed her brief changed due to bladder incontinence. Focused session on transfers and toileting at bed level.  Therapeutic Activity: Bed Mobility: Patient performed sit with mod A with second person SBA for safety. Provided verbal cues for patient to prop on her bottom elbow to support and lower her trunk to side-lying before rolling to supine. Patient performed bridging to doff/don skirt with manual facilitation for LE placement and stabilization at patient's knees to promote gluteal activation and lift. She performed rolling R with min A and L with max A to doff/don incontinence brief and perform peri-care with total A. Patient began to void while changing brief. Reports that she does not know when she needs to void or when she is voiding.  Transfers: Patient performed squat pivot transfer with mod-min A +2. Provided verbal cues for head-hips relationship and use of L UE. PT blocked R knee during transfer for safety.   Noted increased work of breathing with audible wheezing during and following mobility, RN made  aware. Vitals: SPO2 92-95%, HR 66, BP 148/50.  Patient falling asleep in the bed after peri-care. Repositioned to R side-lying for comfort with HOB elevated for improved breath support.  Patient in bed with interpreter in the room at end of session with breaks locked, bed alarm set, and all needs within reach.    Therapy Documentation Precautions:  Precautions Precautions: Fall Precaution Comments: R hemiplegia Restrictions Weight Bearing Restrictions: No    Therapy/Group: Individual Therapy  Kalle Bernath L Serin Thornell PT, DPT  06/07/2020, 5:23 PM

## 2020-06-07 NOTE — Progress Notes (Signed)
[place] PHYSICAL MEDICINE & REHABILITATION PROGRESS NOTE   Subjective/Complaints: Patient seen laying in bed this morning.  Family at bedside who interprets.  Slept well overnight.  Discussed with son support at discharge, discussed with team and therapies as well.  Between children, patient appears to have caregiver support at discharge.  Family fixated on "no movement" on right side.  Patient complains of pain "all over".  ROS: + Generalized pain. Denies chest pain, SOB, N/V/D.  Objective:   No results found. No results for input(s): WBC, HGB, HCT, PLT in the last 72 hours. No results for input(s): NA, K, CL, CO2, GLUCOSE, BUN, CREATININE, CALCIUM in the last 72 hours.  Intake/Output Summary (Last 24 hours) at 06/07/2020 1227 Last data filed at 06/06/2020 1700 Gross per 24 hour  Intake 190 ml  Output --  Net 190 ml     Physical Exam: Vital Signs Blood pressure 139/84, pulse 84, temperature 98.1 F (36.7 C), resp. rate 18, SpO2 93 %. Constitutional: No distress . Vital signs reviewed. HENT: Normocephalic.  Atraumatic. Eyes: EOMI. No discharge. Cardiovascular: No JVD. Respiratory: Normal effort.  No stridor. GI: Non-distended. Skin: Warm and dry.  Intact. Psych: Flat.  Delayed. Musc: No edema in extremities.  No tenderness in extremities. Neuro: Alert Motor: RUE: Shoulder abduction 2/5, elbow flexion/extension 2 -/5, distally 0/5, unchanged Right lower extremity: Hip flexion 1/5, distally 0/5, unchanged To beat clonus RLE  Assessment/Plan: 1. Functional deficits secondary to Left pontine infarct  which require 3+ hours per day of interdisciplinary therapy in a comprehensive inpatient rehab setting.  Physiatrist is providing close team supervision and 24 hour management of active medical problems listed below.  Physiatrist and rehab team continue to assess barriers to discharge/monitor patient progress toward functional and medical goals  Care Tool:  Bathing    Body  parts bathed by patient: Chest, Abdomen, Face, Right arm   Body parts bathed by helper: Left arm Body parts n/a: Front perineal area, Buttocks, Left lower leg, Right lower leg, Left upper leg, Right upper leg   Bathing assist Assist Level: Moderate Assistance - Patient 50 - 74%     Upper Body Dressing/Undressing Upper body dressing   What is the patient wearing?: Button up shirt    Upper body assist Assist Level: Maximal Assistance - Patient 25 - 49%    Lower Body Dressing/Undressing Lower body dressing      What is the patient wearing?: Underwear/pull up     Lower body assist Assist for lower body dressing: Maximal Assistance - Patient 25 - 49%     Toileting Toileting    Toileting assist Assist for toileting: Dependent - Patient 0%     Transfers Chair/bed transfer  Transfers assist     Chair/bed transfer assist level: 2 Helpers     Locomotion Ambulation   Ambulation assist   Ambulation activity did not occur: Safety/medical concerns (dense hemiplegia, decreased strength/activity tolerance)          Walk 10 feet activity   Assist  Walk 10 feet activity did not occur: Safety/medical concerns        Walk 50 feet activity   Assist Walk 50 feet with 2 turns activity did not occur: Safety/medical concerns         Walk 150 feet activity   Assist Walk 150 feet activity did not occur: Safety/medical concerns         Walk 10 feet on uneven surface  activity   Assist Walk 10 feet on  uneven surfaces activity did not occur: Safety/medical concerns         Wheelchair     Assist Will patient use wheelchair at discharge?: Yes (Per PT long term goals ) Type of Wheelchair: Manual Wheelchair activity did not occur: Safety/medical concerns (decreased strength/activity tolerance)         Wheelchair 50 feet with 2 turns activity    Assist    Wheelchair 50 feet with 2 turns activity did not occur: Safety/medical concerns        Wheelchair 150 feet activity     Assist  Wheelchair 150 feet activity did not occur: Safety/medical concerns       Blood pressure 139/84, pulse 84, temperature 98.1 F (36.7 C), resp. rate 18, SpO2 93 %.    Medical Problem List and Plan: 1.Right side hemiparesis and slurred speech with confusionsecondary to Acute left pontine infarction  Continue CIR  WHO/PRAFO ordered 2. Antithrombotics: -DVT/anticoagulation:Lovenox -antiplatelet therapy: Aspirin 81 mg daily and Plavix 75 mg daily x3 weeks then aspirin alone 3. Pain Management:Tylenol as needed, has Musculoskeletal CP (work-up has been negative for cardiogenic source), sportscreme  Voltaren gel ordered to bilateral knees 4. Mood:Provide emotional support -antipsychotic agents: N/A 5. Neuropsych: This patientisnot fully capable of making decisions on herown behalf. 6. Skin/Wound Care:Routine skin checks 7. Fluids/Electrolytes/Nutrition:Routine in and outs with follow-up chemistries 8. Dysphagia. Dysphagia #2 nectar liquids. Follow-up speech therapy 9. Hyperlipidemia. Lipitor 10.  Labile blood pressure  Very labile on 7/8, monitor for trend Vitals:   06/06/20 2048 06/07/20 0448  BP: (!) 177/63 139/84  Pulse: 78 84  Resp: 18 18  Temp: 98.3 F (36.8 C) 98.1 F (36.7 C)  SpO2: 94% 93%  11.  AKI  Creatinine 1.03 on 7/3, labs ordered for tomorrow  Continue to encourage fluids  Continue to monitor  LOS: 5 days A FACE TO FACE EVALUATION WAS PERFORMED  Cozy Veale Karis Juba 06/07/2020, 12:27 PM

## 2020-06-07 NOTE — Progress Notes (Signed)
Speech Language Pathology Daily Session Note  Patient Details  Name: Ann Coleman MRN: [id number] Date of Birth: 04-14-1924  Today's Date: 06/07/2020 SLP Individual Time: 1000-1027 SLP Individual Time Calculation (min): 27 min  Short Term Goals: Week 1: SLP Short Term Goal 1 (Week 1): Pt will participate in continued cognitive lingutistic assesment. SLP Short Term Goal 2 (Week 1): Pt will consume trials of thin liquids with no overt s/s aspriation over x3 treatment sessions and min A verbal cues for use of small sips and intermittent dry swallow prior to liquid upgrade. SLP Short Term Goal 3 (Week 1): Pt consume trials of dys 3 textures with no overt s/s aspiration over x3 treatment sessions with min A verbal cues for oral clearance. SLP Short Term Goal 4 (Week 1): Pt will express wants/needs at phrase/sentence level with min A verbal cues. SLP Short Term Goal 5 (Week 1): Pt will demonstrate 80% intelligibility at the phrase/sentence level with min A verbal cues.  Skilled Therapeutic Interventions: Pt was seen for skilled ST targeting dysphagia. SLP facilitated session with upgraded trial of thin H2O via straw in accordance with water protocol. Pt completed oral care at the sink with set up assist and Min verbal cues for thoroughness. No overt s/sx aspiration observed across ~2oz of thin H2O intake. Given that intake was limited today, would recommend additional trial prior to upgrade. Recommend continue current diet and ST will continue to provide opportunities for advancement. Pt left sitting in chair with alarm set and needs within reach.  Continue per current plan of care.        Pain Pain Assessment Pain Scale: Faces Faces Pain Scale: Hurts a little bit Pain Type: Acute pain Pain Location: Arm Pain Orientation: Right Pain Descriptors / Indicators: Grimacing Pain Onset: On-going Patients Stated Pain Goal: 0 Pain Intervention(s): Repositioned;Distraction Multiple Pain Sites:  No  Therapy/Group: Individual Therapy  Little Ishikawa 06/07/2020, 7:22 AM

## 2020-06-08 ENCOUNTER — Inpatient Hospital Stay (HOSPITAL_COMMUNITY): Payer: Medicare Other | Admitting: Physical Therapy

## 2020-06-08 ENCOUNTER — Inpatient Hospital Stay (HOSPITAL_COMMUNITY): Payer: Medicare Other | Admitting: Speech Pathology

## 2020-06-08 ENCOUNTER — Inpatient Hospital Stay (HOSPITAL_COMMUNITY): Payer: Medicare Other

## 2020-06-08 LAB — BASIC METABOLIC PANEL
Anion gap: 9 (ref 5–15)
BUN: 16 mg/dL (ref 8–23)
CO2: 24 mmol/L (ref 22–32)
Calcium: 8.6 mg/dL — ABNORMAL LOW (ref 8.9–10.3)
Chloride: 107 mmol/L (ref 98–111)
Creatinine, Ser: 0.97 mg/dL (ref 0.44–1.00)
GFR calc Af Amer: 58 mL/min — ABNORMAL LOW (ref >60)
GFR calc non Af Amer: 50 mL/min — ABNORMAL LOW (ref >60)
Glucose, Bld: 108 mg/dL — ABNORMAL HIGH (ref 70–99)
Potassium: 3.7 mmol/L (ref 3.5–5.1)
Sodium: 140 mmol/L (ref 135–145)

## 2020-06-08 NOTE — Progress Notes (Signed)
Physical Therapy Session Note  Patient Details  Name: Ann Coleman MRN: [id number] Date of Birth: 06-13-1924  Today's Date: 06/08/2020 PT Individual Time: 0815-0830 PT Individual Time Calculation (min): 15 min   Short Term Goals: Week 1:  PT Short Term Goal 1 (Week 1): Patient to be able to perform all bed mobility with modAx1 and use of bed features PT Short Term Goal 2 (Week 1): Patient to be able to complete sliding board transfers with MaxAx1 and standby of second person for safety PT Short Term Goal 3 (Week 1): Patient to tolerate standing for 3 minutes in standing frame PT Short Term Goal 4 (Week 1): Patient to initiate gait training  Skilled Therapeutic Interventions/Progress Updates:    Patient received in bed, continues to report concern about not being able to move her arm but reassured this is normal after stroke. Worked on functional exercises for RLE including heel slides with good activation of quad and hamstring groups with gravity reduced, mini-SLRs with ModA and moderate activation of hip flexors and quad, and AAROM with 80% assist for R ankle PF/DF. Also educated both interpreter and patient about no thin liquids except for specific times on water protocol with specific staff until speech therapist clears/progresses her. Left in bed with all needs met, bed alarm active.   Therapy Documentation Precautions:  Precautions Precautions: Fall Precaution Comments: R hemiplegia Restrictions Weight Bearing Restrictions: No Pain: Pain Assessment Pain Scale: Faces Pain Score: 0-No pain Faces Pain Scale: No hurt    Therapy/Group: Individual Therapy   Windell Norfolk, DPT, PN1   Supplemental Physical Therapist [place]    Pager [phone] Acute Rehab Office [phone]    06/08/2020, 12:10 PM

## 2020-06-08 NOTE — Progress Notes (Signed)
Physical Therapy Session Note  Patient Details  Name: Ann Coleman MRN: [id number] Date of Birth: [birth year]  Today's Date: 06/08/2020 PT Individual Time: 1002-1058 PT Individual Time Calculation (min): 56 min   Short Term Goals: Week 1:  PT Short Term Goal 1 (Week 1): Patient to be able to perform all bed mobility with modAx1 and use of bed features PT Short Term Goal 2 (Week 1): Patient to be able to complete sliding board transfers with MaxAx1 and standby of second person for safety PT Short Term Goal 3 (Week 1): Patient to tolerate standing for 3 minutes in standing frame PT Short Term Goal 4 (Week 1): Patient to initiate gait training  Skilled Therapeutic Interventions/Progress Updates:    Patient received in bed, pleasant and willing to participate in therapy today. Able to complete bed mobility with MaxAx1/MinAx1 and continues to require MinA to sit at EOB due to posterior lean; able to perform squat pivot tranfers to/from [facility] with fluctuating levels of assist from Min-MaxAx2 but overall sequencing Fidela better today for transfers in general. Able to maintain sitting balance at EOB with min guard for multiple periods of 3-5 minutes alternating with breaks of MinA leaning back on PT. Continued practicing lateral trunk lean down to elbows for increased core activation and weight bearing through RLE as well with MinA to come back to upright from R elbow and MaxA to come back to upright from L elbow. Introduced standing frame and tolerated very well today, able to stand in standing frame for approximately 4-5 minutes without SOB, dizziness, fatigue or back pain but did need Min cues/assist to prevent trying to lean back in frame at end of standing period. Requested to go back to bed at EOS- returned to and repositioned in bed with ModAx2 and left in chair position to 47 degrees with all needs met, bed alarm active and daughter present.   Therapy Documentation Precautions:  Precautions Precautions:  Fall Precaution Comments: R hemiplegia Restrictions Weight Bearing Restrictions: No   Pain: Pain Assessment Pain Scale: Faces Pain Score: 0-No pain Faces Pain Scale: No hurt    Therapy/Group: Individual Therapy   Windell Norfolk, DPT, PN1   Supplemental Physical Therapist [place]    Pager [phone] Acute Rehab Office [phone]    06/08/2020, 12:14 PM

## 2020-06-08 NOTE — Progress Notes (Signed)
Speech Language Pathology Daily Session Note  Patient Details  Name: Ann Coleman MRN: [id number] Date of Birth: 16-Oct-1924  Today's Date: 06/08/2020 SLP Individual Time: 5537-4827 SLP Individual Time Calculation (min): 27 min  Short Term Goals: Week 1: SLP Short Term Goal 1 (Week 1): Pt will participate in continued cognitive lingutistic assesment. SLP Short Term Goal 2 (Week 1): Pt will consume trials of thin liquids with no overt s/s aspriation over x3 treatment sessions and min A verbal cues for use of small sips and intermittent dry swallow prior to liquid upgrade. SLP Short Term Goal 3 (Week 1): Pt consume trials of dys 3 textures with no overt s/s aspiration over x3 treatment sessions with min A verbal cues for oral clearance. SLP Short Term Goal 4 (Week 1): Pt will express wants/needs at phrase/sentence level with min A verbal cues. SLP Short Term Goal 5 (Week 1): Pt will demonstrate 80% intelligibility at the phrase/sentence level with min A verbal cues.  Skilled Therapeutic Interventions: Pt was seen for skilled ST targeting dysphagia. After completing oral care, pt accepted ~4oz thin H2O via straw without overt s/sx aspiration, despite inconsistent use of extra dry swallow strategy. Pt indicated understanding of recommendation for strategy, but intermittently didn't follow directions. Given consistent absence of overt s/sx aspiration while on water protocol, risk for dehydration, and stable respiratory status, recommend pt upgrade to thin liquids. Continue Dys 2 solids and medications crushed. Pt left laying in bed with alarm set and needs within reach. Continue per current plan of care.        Pain Pain Assessment Pain Scale: 0-10 Pain Score: 0-No pain  Therapy/Group: Individual Therapy  Little Ishikawa 06/08/2020, 7:12 AM

## 2020-06-08 NOTE — Progress Notes (Signed)
Occupational Therapy Session Note  Patient Details  Name: Ann Coleman MRN: [id number] Date of Birth: December 28, 1923  Today's Date: 06/08/2020 OT Individual Time: 4097-3532 OT Individual Time Calculation (min): 68 min    Short Term Goals: Week 1:  OT Short Term Goal 1 (Week 1): Pt will don UB clothing with mod A OT Short Term Goal 2 (Week 1): Pt will sit EOB for ADL with no more than mod A for sitting balance OT Short Term Goal 3 (Week 1): Pt will demonstrate improved R attention with no more than mod cueing for scanning during ADL task OT Short Term Goal 4 (Week 1): Pt and caregiver will correctly position her RUE at rest with min A  Skilled Therapeutic Interventions/Progress Updates:    Pt supine in bed with right RHS donned.  Interpretor and dtr present. Educated pt and dtr regarding use of right RHS at night only to encourage functional use of RUE during daytime.  OT doffed orthosis and noted good skin integrity.  Pt completed left roll and sidelying to sit with max VCs and max assist.  Pt pushing with LUE more however posterior push instead of lateral. Pt completed Stedy transfer sit <>stand and static sitting during transport to shower bench with +2 (pt 50%) with max VCs.  Pt requiring +2 assist to doff skirt and brief in standing and max assist to doff shirt sitting.  Pt bathed with +2 pt anteriorly leaning and very distractable needing frequent redirection.  Pt completed sit<>stand and static sitting back to bed with assist level same as previously.  Pt completed sit to supine with max assist.  UB and LB dressing completed at bed level with max assist.  Pt presenting with significant fatigue, mild shortness of breath needing RB.  Pt agreeable to neuro re-ed at bed level RUE.  OT provided max TCs and VCs to faciliate shoulder FF, abd (with elbow flexed), biceps/triceps both against gravity, FA sup/pronation, digital ext/flex x 8-10 reps each.  Call bell in reach, bed alarm on.  Noted trace movement in  right index through small digit today.  Shoulder, biceps, triceps strength improved today.  Shoulder 2-/5 and biceps/triceps 3-/5.  Pt with significant distractibility during bathing tasks limiting pts sitting balance and independence and participation during self care.   Therapy Documentation Precautions:  Precautions Precautions: Fall Precaution Comments: R hemiplegia Restrictions Weight Bearing Restrictions: No   Therapy/Group: Individual Therapy  Amie Critchley 06/08/2020, 5:54 PM

## 2020-06-08 NOTE — Progress Notes (Signed)
[place] PHYSICAL MEDICINE & REHABILITATION PROGRESS NOTE   Subjective/Complaints: Patient seen laying in bed this AM.  She states she slept well overnight, confirmed with son, who continues to state that she cannot move her arm. Orthosis in place.   ROS: Limited due to language.  Objective:   No results found. No results for input(s): WBC, HGB, HCT, PLT in the last 72 hours. No results for input(s): NA, K, CL, CO2, GLUCOSE, BUN, CREATININE, CALCIUM in the last 72 hours.  Intake/Output Summary (Last 24 hours) at 06/08/2020 1004 Last data filed at 06/08/2020 0800 Gross per 24 hour  Intake 290 ml  Output --  Net 290 ml     Physical Exam: Vital Signs Blood pressure (!) 122/56, pulse 75, temperature 98.2 F (36.8 C), resp. rate 18, SpO2 92 %. Constitutional: No distress . Vital signs reviewed. HENT: Normocephalic.  Atraumatic. Eyes: EOMI. No discharge. Cardiovascular: No JVD. Respiratory: Normal effort.  No stridor. GI: Non-distended. Skin: Warm and dry.  Intact. Psych: Normal mood.  Normal behavior. Musc: No edema in extremities.  No tenderness in extremities. Neuro: Alert Motor: RUE: Shoulder abduction 2/5, elbow flexion/extension 2 -/5, distally 0/5, stable Right lower extremity: Hip flexion 1/5, distally 0/5, stable To beat clonus RLE  Assessment/Plan: 1. Functional deficits secondary to Left pontine infarct  which require 3+ hours per day of interdisciplinary therapy in a comprehensive inpatient rehab setting.  Physiatrist is providing close team supervision and 24 hour management of active medical problems listed below.  Physiatrist and rehab team continue to assess barriers to discharge/monitor patient progress toward functional and medical goals  Care Tool:  Bathing    Body parts bathed by patient: Chest, Right arm, Abdomen, Face   Body parts bathed by helper: Left arm Body parts n/a: Front perineal area, Buttocks, Left lower leg, Right lower leg, Left upper  leg, Right upper leg   Bathing assist Assist Level: Moderate Assistance - Patient 50 - 74%     Upper Body Dressing/Undressing Upper body dressing   What is the patient wearing?: Button up shirt    Upper body assist Assist Level: Maximal Assistance - Patient 25 - 49%    Lower Body Dressing/Undressing Lower body dressing      What is the patient wearing?: Underwear/pull up     Lower body assist Assist for lower body dressing: Maximal Assistance - Patient 25 - 49%     Toileting Toileting    Toileting assist Assist for toileting: Dependent - Patient 0%     Transfers Chair/bed transfer  Transfers assist     Chair/bed transfer assist level: 2 Helpers     Locomotion Ambulation   Ambulation assist   Ambulation activity did not occur: Safety/medical concerns (dense hemiplegia, decreased strength/activity tolerance)          Walk 10 feet activity   Assist  Walk 10 feet activity did not occur: Safety/medical concerns        Walk 50 feet activity   Assist Walk 50 feet with 2 turns activity did not occur: Safety/medical concerns         Walk 150 feet activity   Assist Walk 150 feet activity did not occur: Safety/medical concerns         Walk 10 feet on uneven surface  activity   Assist Walk 10 feet on uneven surfaces activity did not occur: Safety/medical concerns         Wheelchair     Assist Will patient use wheelchair at discharge?:  Yes (Per PT long term goals ) Type of Wheelchair: Manual Wheelchair activity did not occur: Safety/medical concerns (decreased strength/activity tolerance)         Wheelchair 50 feet with 2 turns activity    Assist    Wheelchair 50 feet with 2 turns activity did not occur: Safety/medical concerns       Wheelchair 150 feet activity     Assist  Wheelchair 150 feet activity did not occur: Safety/medical concerns   Assist Level: Dependent - Patient 0%   Blood pressure (!) 122/56,  pulse 75, temperature 98.2 F (36.8 C), resp. rate 18, SpO2 92 %.    Medical Problem List and Plan: 1.Right side hemiparesis and slurred speech with confusionsecondary to Acute left pontine infarction  Continue CIR  WHO/PRAFO qhs 2. Antithrombotics: -DVT/anticoagulation:Lovenox -antiplatelet therapy: Aspirin 81 mg daily and Plavix 75 mg daily x3 weeks then aspirin alone 3. Pain Management:Tylenol as needed, has Musculoskeletal CP (work-up has been negative for cardiogenic source), sportscreme  Voltaren gel ordered to bilateral knees, ?efficacy  4. Mood:Provide emotional support -antipsychotic agents: N/A 5. Neuropsych: This patientisnot fully capable of making decisions on herown behalf. 6. Skin/Wound Care:Routine skin checks 7. Fluids/Electrolytes/Nutrition:Routine in and outs with follow-up chemistries 8. Dysphagia.   Advanced toDysphagia #2 thin liquids.  Follow-up speech therapy 9. Hyperlipidemia. Lipitor 10.  Labile blood pressure  Relatively controlled on 7/9 Vitals:   06/08/20 0437 06/08/20 0440  BP: 128/75 (!) 122/56  Pulse: 70 75  Resp: 18 18  Temp: 98.2 F (36.8 C)   SpO2: 96% 92%  11.  AKI  Creatinine 1.03 on 7/3, labs pending  Continue to encourage fluids  Continue to monitor  LOS: 6 days A FACE TO FACE EVALUATION WAS PERFORMED  Minyon Billiter Karis Juba 06/08/2020, 10:04 AM

## 2020-06-09 ENCOUNTER — Inpatient Hospital Stay (HOSPITAL_COMMUNITY): Payer: Medicare Other | Admitting: Physical Therapy

## 2020-06-09 ENCOUNTER — Inpatient Hospital Stay (HOSPITAL_COMMUNITY): Payer: Medicare Other

## 2020-06-09 NOTE — Progress Notes (Signed)
[facility] PHYSICAL MEDICINE & REHABILITATION PROGRESS NOTE   Subjective/Complaints: Patient seen laying in bed this morning.  He states she is doing "fine".  No reported issues overnight.  No family at bedside.  ROS: Limited due to language  Objective:   No results found. No results for input(s): WBC, HGB, HCT, PLT in the last 72 hours. Recent Labs    06/08/20 1111  NA 140  K 3.7  CL 107  CO2 24  GLUCOSE 108*  BUN 16  CREATININE 0.97  CALCIUM 8.6*    Intake/Output Summary (Last 24 hours) at 06/09/2020 1205 Last data filed at 06/08/2020 2112 Gross per 24 hour  Intake 60 ml  Output --  Net 60 ml     Physical Exam: Vital Signs Blood pressure 135/84, pulse 84, temperature 98.4 F (36.9 C), temperature source Oral, resp. rate 18, SpO2 93 %. Constitutional: No distress . Vital signs reviewed. HENT: Normocephalic.  Atraumatic. Eyes: EOMI. No discharge. Cardiovascular: No JVD. Respiratory: Normal effort.  No stridor. GI: Non-distended. Skin: Warm and dry.  Intact. Psych: Normal mood.  Normal behavior. Musc: No edema in extremities.  No tenderness in extremities. Neuro: Alert Motor: RUE: Shoulder abduction 2/5, elbow flexion/extension 2 -/5, distally 0/5, unchanged Right lower extremity: Hip flexion 1/5, distally 0/5, unchanged  Assessment/Plan: 1. Functional deficits secondary to Left pontine infarct  which require 3+ hours per day of interdisciplinary therapy in a comprehensive inpatient rehab setting.  Physiatrist is providing close team supervision and 24 hour management of active medical problems listed below.  Physiatrist and rehab team continue to assess barriers to discharge/monitor patient progress toward functional and medical goals  Care Tool:  Bathing    Body parts bathed by patient: Chest, Abdomen, Face, Front perineal area   Body parts bathed by helper: Right arm, Left arm, Right upper leg, Left upper leg, Left lower leg, Right lower leg Body parts  n/a: Buttocks   Bathing assist Assist Level: 2 Helpers (in walk in shower sitting on shower bench)     Upper Body Dressing/Undressing Upper body dressing   What is the patient wearing?: Pull over shirt    Upper body assist Assist Level: Maximal Assistance - Patient 25 - 49%    Lower Body Dressing/Undressing Lower body dressing      What is the patient wearing?: Underwear/pull up, Skirt     Lower body assist Assist for lower body dressing: Maximal Assistance - Patient 25 - 49%     Toileting Toileting    Toileting assist Assist for toileting: Dependent - Patient 0%     Transfers Chair/bed transfer  Transfers assist     Chair/bed transfer assist level: 2 Helpers     Locomotion Ambulation   Ambulation assist   Ambulation activity did not occur: Safety/medical concerns (dense hemiplegia, decreased strength/activity tolerance)          Walk 10 feet activity   Assist  Walk 10 feet activity did not occur: Safety/medical concerns        Walk 50 feet activity   Assist Walk 50 feet with 2 turns activity did not occur: Safety/medical concerns         Walk 150 feet activity   Assist Walk 150 feet activity did not occur: Safety/medical concerns         Walk 10 feet on uneven surface  activity   Assist Walk 10 feet on uneven surfaces activity did not occur: Safety/medical concerns         Wheelchair  Assist Will patient use wheelchair at discharge?: Yes (Per PT long term goals ) Type of Wheelchair: Manual Wheelchair activity did not occur: Safety/medical concerns (decreased strength/activity tolerance)         Wheelchair 50 feet with 2 turns activity    Assist    Wheelchair 50 feet with 2 turns activity did not occur: Safety/medical concerns       Wheelchair 150 feet activity     Assist  Wheelchair 150 feet activity did not occur: Safety/medical concerns   Assist Level: Dependent - Patient 0%   Blood pressure  135/84, pulse 84, temperature 98.4 F (36.9 C), temperature source Oral, resp. rate 18, SpO2 93 %.    Medical Problem List and Plan: 1.Right side hemiparesis and slurred speech with confusionsecondary to Acute left pontine infarction  Continue CIR  WHO/PRAFO qhs 2. Antithrombotics: -DVT/anticoagulation:Lovenox -antiplatelet therapy: Aspirin 81 mg daily and Plavix 75 mg daily x3 weeks then aspirin alone 3. Pain Management:Tylenol as needed, has Musculoskeletal CP (work-up has been negative for cardiogenic source), sportscreme  Voltaren gel ordered to bilateral knees, ?efficacy    Appears relatively controlled on 7/10 4. Mood:Provide emotional support -antipsychotic agents: N/A 5. Neuropsych: This patientisnot fully capable of making decisions on herown behalf. 6. Skin/Wound Care:Routine skin checks 7. Fluids/Electrolytes/Nutrition:Routine in and outs with follow-up chemistries 8. Dysphagia.   Advanced toDysphagia #2 thin liquids.  Follow-up speech therapy 9. Hyperlipidemia. Lipitor 10.  Labile blood pressure  Labile on 7/10, monitor for trend Vitals:   06/08/20 2046 06/09/20 0430  BP: (!) 157/70 135/84  Pulse: (!) 107 84  Resp: 18 18  Temp: 98.4 F (36.9 C) 98.4 F (36.9 C)  SpO2: 95% 93%  11.  AKI  GFR 50  Creatinine 0.97 on 7/9  Continue to encourage fluids  Continue to monitor  LOS: 7 days A FACE TO FACE EVALUATION WAS PERFORMED  Amilio Zehnder Karis Juba 06/09/2020, 12:05 PM

## 2020-06-09 NOTE — Progress Notes (Signed)
Physical Therapy Session Note  Patient Details  Name: Ann Coleman MRN: [id number] Date of Birth: 03/26/1924  Today's Date: 06/09/2020 PT Individual Time: 1510-1540 PT Individual Time Calculation (min): 30 min   Short Term Goals: Week 1:  PT Short Term Goal 1 (Week 1): Patient to be able to perform all bed mobility with modAx1 and use of bed features PT Short Term Goal 2 (Week 1): Patient to be able to complete sliding board transfers with MaxAx1 and standby of second person for safety PT Short Term Goal 3 (Week 1): Patient to tolerate standing for 3 minutes in standing frame PT Short Term Goal 4 (Week 1): Patient to initiate gait training  Skilled Therapeutic Interventions/Progress Updates:   Pt received supine in bed and agreeable to PT. PT applied socks to BLE Supine>sit transfer with mod assist and HOB slightly elevated. Squat pivot transfer to [facility] with max assist. Attempted kinetron but able to position pt correctly due to pt's stature and wide WC base. Sit<>stand in standing frame x 2. Pt performed partial stand in sling. Superior reach x 5 BUE with min cues for posture and weight shifting in reach. Patient returned to room and left sitting in [facility] with call bell in reach and all needs met.           Therapy Documentation Precautions:  Precautions Precautions: Fall Precaution Comments: R hemiplegia Restrictions Weight Bearing Restrictions: No    Vital Signs: Therapy Vitals Temp: 99.2 F (37.3 C) Pulse Rate: 80 Resp: 20 BP: (!) 130/97 Patient Position (if appropriate): Lying Oxygen Therapy SpO2: 95 % O2 Device: Room Air Pain: Faces: none   Therapy/Group: Individual Therapy  Lorie Phenix 06/09/2020, 3:44 PM

## 2020-06-09 NOTE — Progress Notes (Signed)
Occupational Therapy Session Note  Patient Details  Name: Ann Coleman MRN: [id number] Date of Birth: [birth year]  Today's Date: 06/09/2020 OT Individual Time: 1030-1050 OT Individual Time Calculation (min): 20 min    Short Term Goals: Week 1:  OT Short Term Goal 1 (Week 1): Pt will don UB clothing with mod A OT Short Term Goal 2 (Week 1): Pt will sit EOB for ADL with no more than mod A for sitting balance OT Short Term Goal 3 (Week 1): Pt will demonstrate improved R attention with no more than mod cueing for scanning during ADL task OT Short Term Goal 4 (Week 1): Pt and caregiver will correctly position her RUE at rest with min A  Skilled Therapeutic Interventions/Progress Updates:    Upon arrival family was present to interpret. Pt initially declining therapy due to fatigue, however did request brief to be changed. OT session focused on bed mobility and caregiver education for positioning of RUE in bed and with bed mobility. Pt dependent for changing brief and hygiene. Completed rolling to R side with CGA and L side with mod A, OT providing total assist for placement of RUE. OT positioned RUE on pillow, educating caregiver on this as well. OT encouraged PROM exercises, pt declined. Pt left in bed with caregiver present and all needs in reach.   Therapy Documentation Precautions:  Precautions Precautions: Fall Precaution Comments: R hemiplegia Restrictions Weight Bearing Restrictions: No General:   Vital Signs:   Pain:   ADL: ADL Eating: Minimal assistance Where Assessed-Eating: Bed level Grooming: Minimal assistance Where Assessed-Grooming: Chair Upper Body Bathing: Moderate assistance, Moderate cueing Where Assessed-Upper Body Bathing: Sitting at sink Lower Body Bathing: Maximal assistance Where Assessed-Lower Body Bathing: Chair Upper Body Dressing: Maximal assistance Where Assessed-Upper Body Dressing: Sitting at sink Lower Body Dressing: Dependent Where Assessed-Lower Body  Dressing: Sitting at sink Toileting: Dependent Where Assessed-Toileting: Bedside Commode Toilet Transfer: Maximal verbal cueing, Dependent Toilet Transfer Method: Stand pivot Toilet Transfer Equipment: Bedside commode Tub/Shower Transfer: Unable to assess Film/video editor: Unable to assess Vision   Perception    Praxis   Exercises:   Other Treatments:     Therapy/Group: Individual Therapy  Daneil Dan 06/09/2020, 10:53 AM

## 2020-06-09 NOTE — Plan of Care (Signed)
  Problem: RH BOWEL ELIMINATION Goal: RH STG MANAGE BOWEL WITH ASSISTANCE Description: STG Manage Bowel with mod I Assistance. Outcome: Progressing Goal: RH STG MANAGE BOWEL W/MEDICATION W/ASSISTANCE Description: STG Manage Bowel with Medication with mod I Assistance. Outcome: Progressing   Problem: RH BLADDER ELIMINATION Goal: RH STG MANAGE BLADDER WITH ASSISTANCE Description: STG Manage Bladder With min Assistance Outcome: Progressing   

## 2020-06-10 NOTE — Plan of Care (Signed)
  Problem: RH BOWEL ELIMINATION Goal: RH STG MANAGE BOWEL WITH ASSISTANCE Description: STG Manage Bowel with  mod I Assistance. Outcome: Progressing   Problem: RH BLADDER ELIMINATION Goal: RH STG MANAGE BLADDER WITH ASSISTANCE Description: STG Manage Bladder With  min Assistance Outcome: Progressing Goal: RH STG MANAGE BLADDER WITH MEDICATION WITH ASSISTANCE Description: STG Manage Bladder With Medication With mod I Assistance. Outcome: Progressing

## 2020-06-10 NOTE — Progress Notes (Signed)
[facility] PHYSICAL MEDICINE & REHABILITATION PROGRESS NOTE   Subjective/Complaints: Patient seen laying in bed this morning.  She states she slept "fine".  No reported issues overnight.  ROS: Limited due to language  Objective:   No results found. No results for input(s): WBC, HGB, HCT, PLT in the last 72 hours. Recent Labs    06/08/20 1111  NA 140  K 3.7  CL 107  CO2 24  GLUCOSE 108*  BUN 16  CREATININE 0.97  CALCIUM 8.6*    Intake/Output Summary (Last 24 hours) at 06/10/2020 1556 Last data filed at 06/10/2020 1340 Gross per 24 hour  Intake 247 ml  Output --  Net 247 ml     Physical Exam: Vital Signs Blood pressure (!) 145/79, pulse 78, temperature 97.7 F (36.5 C), temperature source Oral, resp. rate 17, SpO2 94 %. Constitutional: No distress . Vital signs reviewed. HENT: Normocephalic.  Atraumatic. Eyes: EOMI. No discharge. Cardiovascular: No JVD. Respiratory: Normal effort.  No stridor. GI: Non-distended. Skin: Warm and dry.  Intact. Psych: Normal mood.  Normal behavior. Musc: No edema in extremities.  No tenderness in extremities. Neuro: Alert Motor: RUE: Shoulder abduction 2/5, elbow flexion/extension 2 -/5, distally 0/5, stable Right lower extremity: Hip flexion 1/5, distally 0/5, stable  Assessment/Plan: 1. Functional deficits secondary to Left pontine infarct  which require 3+ hours per day of interdisciplinary therapy in a comprehensive inpatient rehab setting.  Physiatrist is providing close team supervision and 24 hour management of active medical problems listed below.  Physiatrist and rehab team continue to assess barriers to discharge/monitor patient progress toward functional and medical goals  Care Tool:  Bathing    Body parts bathed by patient: Chest, Abdomen, Face, Front perineal area   Body parts bathed by helper: Right arm, Left arm, Right upper leg, Left upper leg, Left lower leg, Right lower leg Body parts n/a: Buttocks   Bathing  assist Assist Level: 2 Helpers (in walk in shower sitting on shower bench)     Upper Body Dressing/Undressing Upper body dressing   What is the patient wearing?: Pull over shirt    Upper body assist Assist Level: Maximal Assistance - Patient 25 - 49%    Lower Body Dressing/Undressing Lower body dressing      What is the patient wearing?: Underwear/pull up, Skirt     Lower body assist Assist for lower body dressing: Maximal Assistance - Patient 25 - 49%     Toileting Toileting    Toileting assist Assist for toileting: Dependent - Patient 0%     Transfers Chair/bed transfer  Transfers assist     Chair/bed transfer assist level: 2 Helpers     Locomotion Ambulation   Ambulation assist   Ambulation activity did not occur: Safety/medical concerns (dense hemiplegia, decreased strength/activity tolerance)          Walk 10 feet activity   Assist  Walk 10 feet activity did not occur: Safety/medical concerns        Walk 50 feet activity   Assist Walk 50 feet with 2 turns activity did not occur: Safety/medical concerns         Walk 150 feet activity   Assist Walk 150 feet activity did not occur: Safety/medical concerns         Walk 10 feet on uneven surface  activity   Assist Walk 10 feet on uneven surfaces activity did not occur: Safety/medical concerns         Wheelchair     Assist  Will patient use wheelchair at discharge?: Yes (Per PT long term goals ) Type of Wheelchair: Manual Wheelchair activity did not occur: Safety/medical concerns (decreased strength/activity tolerance)         Wheelchair 50 feet with 2 turns activity    Assist    Wheelchair 50 feet with 2 turns activity did not occur: Safety/medical concerns       Wheelchair 150 feet activity     Assist  Wheelchair 150 feet activity did not occur: Safety/medical concerns   Assist Level: Dependent - Patient 0%   Blood pressure (!) 145/79, pulse 78,  temperature 97.7 F (36.5 C), temperature source Oral, resp. rate 17, SpO2 94 %.    Medical Problem List and Plan: 1.Right side hemiparesis and slurred speech with confusionsecondary to Acute left pontine infarction  Continue CIR  WHO/PRAFO qhs 2. Antithrombotics: -DVT/anticoagulation:Lovenox -antiplatelet therapy: Aspirin 81 mg daily and Plavix 75 mg daily x3 weeks then aspirin alone 3. Pain Management:Tylenol as needed, has Musculoskeletal CP (work-up has been negative for cardiogenic source), sportscreme  Voltaren gel ordered to bilateral knees, ?efficacy    Appears relatively controlled on 7/11 4. Mood:Provide emotional support -antipsychotic agents: N/A 5. Neuropsych: This patientisnot fully capable of making decisions on herown behalf. 6. Skin/Wound Care:Routine skin checks 7. Fluids/Electrolytes/Nutrition:Routine in and outs with follow-up chemistries 8. Dysphagia.   Advanced to dysphagia #2 thin liquids.  .  P.o. intake  Follow-up speech therapy 9. Hyperlipidemia. Lipitor 10.  Labile blood pressure  Relatively controlled on 7/11 Vitals:   06/10/20 0524 06/10/20 1419  BP: 134/73 (!) 145/79  Pulse: 90 78  Resp: 18 17  Temp: 98.5 F (36.9 C) 97.7 F (36.5 C)  SpO2: 93% 94%  11.  AKI  GFR 50  Creatinine 0.97 on 7/9, repeat labs later this week  Continue to encourage fluids  Continue to monitor  LOS: 8 days A FACE TO FACE EVALUATION WAS PERFORMED  Garima Chronis Karis Juba 06/10/2020, 3:56 PM

## 2020-06-11 ENCOUNTER — Inpatient Hospital Stay (HOSPITAL_COMMUNITY): Payer: Medicare Other | Admitting: Occupational Therapy

## 2020-06-11 ENCOUNTER — Inpatient Hospital Stay (HOSPITAL_COMMUNITY): Payer: Medicare Other | Admitting: Physical Therapy

## 2020-06-11 ENCOUNTER — Inpatient Hospital Stay (HOSPITAL_COMMUNITY): Payer: Medicare Other | Admitting: Speech Pathology

## 2020-06-11 MED ORDER — ACETAMINOPHEN 325 MG PO TABS
650.0000 mg | ORAL_TABLET | Freq: Four times a day (QID) | ORAL | Status: DC
  Administered 2020-06-11 – 2020-06-22 (×40): 650 mg via ORAL
  Filled 2020-06-11 (×42): qty 2

## 2020-06-11 NOTE — Progress Notes (Signed)
Occupational Therapy Session Note  Patient Details  Name: Ann Coleman MRN: [id number] Date of Birth: 07-16-1924  Today's Date: 06/11/2020 OT Individual Time: 1100-1130 OT Individual Time Calculation (min): 30 min    Short Term Goals: Week 1:  OT Short Term Goal 1 (Week 1): Pt will don UB clothing with mod A OT Short Term Goal 2 (Week 1): Pt will sit EOB for ADL with no more than mod A for sitting balance OT Short Term Goal 3 (Week 1): Pt will demonstrate improved R attention with no more than mod cueing for scanning during ADL task OT Short Term Goal 4 (Week 1): Pt and caregiver will correctly position her RUE at rest with min A  Skilled Therapeutic Interventions/Progress Updates:    Pt received in w/c with interpretor present.   From w/c , worked on [employer] with assisted push/pull exercises for shoulder and flex/ext for elbow movement patterns.   With +2, pt stood at sink with therapist blocking knee and supporting RUE with tech supporting pt on L side.  Pt unable to bring hips forward to extend spine.  Tried a second time with use of bariatric stedy to block R knee and pt able to rise taller.  She used R hand on stedy bar with A to maintain grasp. Pt could only hold stand for 8-10 seconds.  She was very focused on getting back to bed.   Pt transferred to bed with stedy but then needed max cues to not try to lean back quickly which would have pushed her hips forward off the bed.  Pt tried to do so anyway so ultimately needed +2 to safely  Get pt into the bed.  Pt adjusted in bed in sidelying with pillows for support. All needs met and bed alarm set.    Therapy Documentation Precautions:  Precautions Precautions: Fall Precaution Comments: R hemiplegia Restrictions Weight Bearing Restrictions: No    Vital Signs: Therapy Vitals Temp: 98.4 F (36.9 C) Temp Source: Oral Pulse Rate: 67 Resp: 18 BP: 137/64 Patient Position (if appropriate): Lying Oxygen Therapy SpO2: 95 % O2 Device:  Room Air Pain: Pain Assessment Pain Scale: 0-10 Pain Score: 5  Pain Type: Acute pain Pain Location: Generalized Pain Descriptors / Indicators: Grimacing Pain Frequency: Constant Pain Onset: On-going Pain Intervention(s): Medication (See eMAR);Repositioned ADL: ADL Eating: Minimal assistance Where Assessed-Eating: Bed level Grooming: Minimal assistance Where Assessed-Grooming: Chair Upper Body Bathing: Moderate assistance, Moderate cueing Where Assessed-Upper Body Bathing: Sitting at sink Lower Body Bathing: Maximal assistance Where Assessed-Lower Body Bathing: Chair Upper Body Dressing: Maximal assistance Where Assessed-Upper Body Dressing: Sitting at sink Lower Body Dressing: Dependent Where Assessed-Lower Body Dressing: Sitting at sink Toileting: Dependent Where Assessed-Toileting: Bedside Commode Toilet Transfer: Maximal verbal cueing, Dependent Toilet Transfer Method: Stand pivot Toilet Transfer Equipment: Bedside commode Tub/Shower Transfer: Unable to assess Gaffer Transfer: Unable to assess   Therapy/Group: Individual Therapy  [place] 06/11/2020, 8:28 AM

## 2020-06-11 NOTE — Progress Notes (Signed)
Physical Therapy Weekly Progress Note  Patient Details  Name: Ann Coleman MRN: [id number] Date of Birth: [birth year]  Beginning of progress report period: June 03, 2020 End of progress report period: June 11, 2020  Today's Date: 06/11/2020 PT Individual Time: 0908-1002 PT Individual Time Calculation (min): 54 min   Patient has met 1 of 4 short term goals.  Ms. Swickard is progressing well with therapy demonstrating improving initiation, motor planning, trunk control, and attention/awarnes. She is performing supine>sit with max assist of 1, sit>supine with +2 max assist, squat pivot transfers with max assist of 1 person and 2nd person for min/mod assist. She is performing sit<>stands with +2 max assist and has tolerated standing frame for up to 4-62mnutes. She has not yet progressed to gait training.  Patient continues to demonstrate the following deficits muscle weakness, muscle joint tightness and muscle paralysis, decreased cardiorespiratoy endurance, impaired timing and sequencing, abnormal tone, unbalanced muscle activation, motor apraxia, decreased coordination and decreased motor planning, decreased visual perceptual skills and decreased visual motor skills, decreased attention to right, decreased initiation, decreased attention, decreased awareness, decreased problem solving, decreased safety awareness, decreased memory and delayed processing and decreased sitting balance, decreased standing balance, decreased postural control and decreased balance strategies and therefore will continue to benefit from skilled PT intervention to increase functional independence with mobility.  Patient progressing toward long term goals..  Continue plan of care.  PT Short Term Goals Week 1:  PT Short Term Goal 1 (Week 1): Patient to be able to perform all bed mobility with modAx1 and use of bed features PT Short Term Goal 1 - Progress (Week 1): Progressing toward goal PT Short Term Goal 2 (Week 1): Patient to be able to  complete sliding board transfers with MaxAx1 and standby of second person for safety PT Short Term Goal 2 - Progress (Week 1): Progressing toward goal PT Short Term Goal 3 (Week 1): Patient to tolerate standing for 3 minutes in standing frame PT Short Term Goal 3 - Progress (Week 1): Met PT Short Term Goal 4 (Week 1): Patient to initiate gait training PT Short Term Goal 4 - Progress (Week 1): Progressing toward goal Week 2:  PT Short Term Goal 1 (Week 2): Pt will perform supine<>sit with max assist of 1 person PT Short Term Goal 2 (Week 2): Pt will perform bed<>chair transfers with max assist of 1 person (+2 can be present for safety) PT Short Term Goal 3 (Week 2): Pt will perform sit<>stands with +2 mod assist PT Short Term Goal 4 (Week 2): Pt will initiate gait training using harness support as needed  Skilled Therapeutic Interventions/Progress Updates:  Ambulation/gait training;Community reintegration;DME/adaptive equipment instruction;Neuromuscular re-education;Psychosocial support;Stair training;UE/LE Strength taining/ROM;Wheelchair propulsion/positioning;Balance/vestibular training;Discharge planning;Functional electrical stimulation;Pain management;Skin care/wound management;Therapeutic Activities;UE/LE Coordination activities;Cognitive remediation/compensation;Disease management/prevention;Functional mobility training;Patient/family education;Splinting/orthotics;Therapeutic Exercise;Visual/perceptual remediation/compensation   Pt received supine in bed and agreeable to therapy session. In-person interpreter, [place], present for entire session. Supine>sitting L EOB, HOB partially elevated and using bedrails, with max assist for trunk upright - pt able to initiate rolling and demos good R UE involvement in task. Sitting EOB with min assist and intermittent mod assist to recover posterior LOB when fatigued - performed B UE reaching tasks minimally outside BOS targeting R UE NMR and core/trunk  control - pt able to use L UE to assist with trunk recovery when LOB occurs. Pt demos slight R LE quad activation sitting EOB. R squat pivot to w/c with max assist of 1 and mod assist of  2nd person - total assist for R LE management.  Transported to/from gym in w/c for time management and energy conservation. Standing frame unavailable at this time. R squat pivot w/c>EOM with max assist of 1 and mod assist of 2nd person for lifting/pivoting hips - due to pt's height the floor-to-seat height of mat makes it difficult to lift her hips high enough to sit back safely on mat. Sitting EOM with CGA for safety and intermittent min assist. Therapist placed step under pt's feet to improve floor-seat height. Performed sit<>stand to/from EOM x 3 trials until pt finally able to come to a full stand with +2 max assist due to pt requiring max manual facilitation for hip extension and total assist to block R knee to come to stand - mirror in front of pt for visual feedback - tolerated 2 full stands for ~30seconds-38mnute each focusing on R LE NMR via weightbearing with cuing and manual facilitation for knee extension. L squat pivot EOM>w/c, using step under pt's feet for improved floor-seat height- with mod assist of 1 person and min assist of 2nd person - also going towards pt's noninvolved side at slight downhill allowing easier transfer. Transported back to room and pt agreeable to remain sitting in w/c until next therapy session in ~329mutes. Pt left tilted back in TIS w/c with needs in reach, seat belt alarm on, R UE supported for positioning, and interpreter still present.  Therapy Documentation Precautions:  Precautions Precautions: Fall Precaution Comments: R hemiplegia Restrictions Weight Bearing Restrictions: No  Pain: No reports of pain throughout session.  Therapy/Group: Individual Therapy  CaTawana ScalePT, DPT, CSRS  06/11/2020, 7:53 AM

## 2020-06-11 NOTE — Progress Notes (Signed)
Speech Language Pathology Daily Session Note  Patient Details  Name: Ann Coleman MRN: [id number] Date of Birth: [birth year]  Today's Date: 06/11/2020 SLP Individual Time: 1031-1057 SLP Individual Time Calculation (min): 26 min  Short Term Goals: Week 1: SLP Short Term Goal 1 (Week 1): Pt will participate in continued cognitive lingutistic assesment. SLP Short Term Goal 2 (Week 1): Pt will consume trials of thin liquids with no overt s/s aspriation over x3 treatment sessions and min A verbal cues for use of small sips and intermittent dry swallow prior to liquid upgrade. SLP Short Term Goal 3 (Week 1): Pt consume trials of dys 3 textures with no overt s/s aspiration over x3 treatment sessions with min A verbal cues for oral clearance. SLP Short Term Goal 4 (Week 1): Pt will express wants/needs at phrase/sentence level with min A verbal cues. SLP Short Term Goal 5 (Week 1): Pt will demonstrate 80% intelligibility at the phrase/sentence level with min A verbal cues.  Skilled Therapeutic Interventions: Pt was seen for skilled ST targeting dysphagia. SLP facilitated session with upgraded trial of dysphagia 3 (mech soft) solids with thin liquids. Pt also found to have dysphagia 3 and regular textured food items from home in her room, therefore some of those items were provided in advanced solid trials. Pt's mastication continues to be mildly prolonged, and she requires Supervision A verbal cues to use safe swallow precautions, particularly to take smaller bites. However, suspect pt's oral phase is close to baseline, given that she is edentulous and family's reports of her baseline function. No overt s/sx aspiration observed across solid or liquid intake. Recommend upgrade to dysphagia 3 solids, continue thin liquids and meds whole in puree. Pt left sitting in chair with alarm set and needs within reach. Continue per current plan of care.          Pain Pain Assessment Pain Scale: Faces Faces Pain Scale: No  hurt  Therapy/Group: Individual Therapy  Little Ishikawa 06/11/2020, 7:19 AM

## 2020-06-11 NOTE — Progress Notes (Signed)
Occupational Therapy Weekly Progress Note  Patient Details  Name: Ann Coleman MRN: [id number] Date of Birth: [birth year]  Beginning of progress report period: June 03, 2020 End of progress report period: June 11, 2020  Today's Date: 06/11/2020 OT Individual Time: 7471-5953 OT Individual Time Calculation (min): 68 min    Patient has met 3 of 4 short term goals.  Pt making good progress with skilled OT this week evidenced by increased RUE motor return and improved sitting and standing balance, increased attention to right side, and slightly improved sustained attention skills.  Pt also increased independence with UB dressing from max assist to mod assist.  Pt still requires substantial assist for LB self care due to needing LUE for balance support and limited RUE functional use.  Pts right hand still very weak but strength is returning slowly.    Patient continues to demonstrate the following deficits: muscle weakness, decreased cardiorespiratoy endurance, impaired timing and sequencing, abnormal tone, motor apraxia, decreased coordination and decreased motor planning, decreased visual perceptual skills, decreased attention to right, decreased initiation, decreased attention, decreased awareness, decreased problem solving, decreased safety awareness, decreased memory and delayed processing and decreased sitting balance, decreased standing balance and decreased balance strategies and therefore will continue to benefit from skilled OT intervention to enhance overall performance with BADL.  Patient progressing toward long term goals..  Continue plan of care.  OT Short Term Goals Week 1:  OT Short Term Goal 1 (Week 1): Pt will don UB clothing with mod A OT Short Term Goal 2 (Week 1): Pt will sit EOB for ADL with no more than mod A for sitting balance OT Short Term Goal 3 (Week 1): Pt will demonstrate improved R attention with no more than mod cueing for scanning during ADL task OT Short Term Goal 4 (Week  1): Pt and caregiver will correctly position her RUE at rest with min A  Skilled Therapeutic Interventions/Progress Updates:    Pt sitting up in bed with dtr and interpreter present.  Rehab tech present for partial visit to assist with functional transfers. Neuro re-ed completed RUE to facilitate isolated muscle contraction shoulder FF and abd, elbow flex and extension (against gravity), FA supination, wrist extension, digital flexion/extension x 10 reps each needing min to mod TCs and cueing visual feedback   Pt agreeable to bathing in walk in shower after OT encouragement provided.  Pt completed bed mobility with max assist to EOB.  Sit<> stand at [facility] with +2 (pt 75%) dependent transport completed EOB to shower bench. Foot stool placed under BLE for support. Pt completed UB dressing and bathing with mod assist.  LB bathing with mod assist not including buttocks. Pt completed LB dressing with +2 for balance and sit <>stand and max assist for clothing mgt. Pt transferred using Stedy same assist level as prior.   Pt completed bed mobility back to supine with max assist.  Call bell in reach, bed alarm on.  Therapy Documentation Precautions:  Precautions Precautions: Fall Precaution Comments: R hemiplegia Restrictions Weight Bearing Restrictions: No   Therapy/Group: Individual Therapy  Ezekiel Slocumb 06/11/2020, 12:58 PM

## 2020-06-11 NOTE — Progress Notes (Signed)
[place] PHYSICAL MEDICINE & REHABILITATION PROGRESS NOTE   Subjective/Complaints: Lying in bed. No new complaints. Nurse did not report any problems  ROS: limited due to language/communication   Objective:   No results found. No results for input(s): WBC, HGB, HCT, PLT in the last 72 hours. No results for input(s): NA, K, CL, CO2, GLUCOSE, BUN, CREATININE, CALCIUM in the last 72 hours.  Intake/Output Summary (Last 24 hours) at 06/11/2020 1158 Last data filed at 06/11/2020 0800 Gross per 24 hour  Intake 340 ml  Output --  Net 340 ml     Physical Exam: Vital Signs Blood pressure 137/64, pulse 67, temperature 98.4 F (36.9 C), temperature source Oral, resp. rate 18, SpO2 95 %. Constitutional: No distress . Vital signs reviewed. HEENT: EOMI, oral membranes moist Neck: supple Cardiovascular: RRR without murmur. No JVD    Respiratory/Chest: CTA Bilaterally without wheezes or rales. Normal effort    GI/Abdomen: BS +, non-tender, non-distended Ext: no clubbing, cyanosis, or edema Psych: pleasant and cooperative Skin Musc: No edema in extremities.  No tenderness in extremities. Neuro: Alert Motor: RUE: Shoulder abduction 2/5, elbow flexion/extension 2 -/5, distally 0/5, stable Right lower extremity: Hip flexion 1/5, distally 0/5, stable  Assessment/Plan: 1. Functional deficits secondary to Left pontine infarct  which require 3+ hours per day of interdisciplinary therapy in a comprehensive inpatient rehab setting.  Physiatrist is providing close team supervision and 24 hour management of active medical problems listed below.  Physiatrist and rehab team continue to assess barriers to discharge/monitor patient progress toward functional and medical goals  Care Tool:  Bathing    Body parts bathed by patient: Chest, Abdomen, Face, Front perineal area   Body parts bathed by helper: Right arm, Left arm, Right upper leg, Left upper leg, Left lower leg, Right lower leg Body parts  n/a: Buttocks   Bathing assist Assist Level: 2 Helpers (in walk in shower sitting on shower bench)     Upper Body Dressing/Undressing Upper body dressing   What is the patient wearing?: Pull over shirt    Upper body assist Assist Level: Maximal Assistance - Patient 25 - 49%    Lower Body Dressing/Undressing Lower body dressing      What is the patient wearing?: Underwear/pull up, Skirt     Lower body assist Assist for lower body dressing: Maximal Assistance - Patient 25 - 49%     Toileting Toileting    Toileting assist Assist for toileting: Dependent - Patient 0%     Transfers Chair/bed transfer  Transfers assist     Chair/bed transfer assist level: 2 Helpers     Locomotion Ambulation   Ambulation assist   Ambulation activity did not occur: Safety/medical concerns (dense hemiplegia, decreased strength/activity tolerance)          Walk 10 feet activity   Assist  Walk 10 feet activity did not occur: Safety/medical concerns        Walk 50 feet activity   Assist Walk 50 feet with 2 turns activity did not occur: Safety/medical concerns         Walk 150 feet activity   Assist Walk 150 feet activity did not occur: Safety/medical concerns         Walk 10 feet on uneven surface  activity   Assist Walk 10 feet on uneven surfaces activity did not occur: Safety/medical concerns         Wheelchair     Assist Will patient use wheelchair at discharge?: Yes (Per PT  long term goals ) Type of Wheelchair: Manual Wheelchair activity did not occur: Safety/medical concerns (decreased strength/activity tolerance)         Wheelchair 50 feet with 2 turns activity    Assist    Wheelchair 50 feet with 2 turns activity did not occur: Safety/medical concerns       Wheelchair 150 feet activity     Assist  Wheelchair 150 feet activity did not occur: Safety/medical concerns   Assist Level: Dependent - Patient 0%   Blood pressure  137/64, pulse 67, temperature 98.4 F (36.9 C), temperature source Oral, resp. rate 18, SpO2 95 %.    Medical Problem List and Plan: 1.Right side hemiparesis and slurred speech with confusionsecondary to Acute left pontine infarction  Continue CIR  WHO/PRAFO qhs 2. Antithrombotics: -DVT/anticoagulation:Lovenox -antiplatelet therapy: Aspirin 81 mg daily and Plavix 75 mg daily x3 weeks then aspirin alone 3. Pain Management:Tylenol as needed, has Musculoskeletal CP (work-up has been negative for cardiogenic source), sportscreme  Voltaren gel ordered to bilateral knees, ?efficacy    Appears comfortable 7/12 4. Mood:Provide emotional support -antipsychotic agents: N/A 5. Neuropsych: This patientisnot fully capable of making decisions on herown behalf. 6. Skin/Wound Care:Routine skin checks 7. Fluids/Electrolytes/Nutrition:Routine in and outs with follow-up chemistries 8. Dysphagia.   Advanced to dysphagia #2 thin liquids.  Encourage P.o. intake  Follow-up speech therapy 9. Hyperlipidemia. Lipitor 10.  Labile blood pressure  Relatively controlled on 7/12 Vitals:   06/10/20 2045 06/11/20 0437  BP: 126/65 137/64  Pulse: 72 67  Resp: 18 18  Temp: 99.4 F (37.4 C) 98.4 F (36.9 C)  SpO2: 94% 95%  11.  AKI  GFR 50  Creatinine 0.97 on 7/9, repeat labs later this week  Continue to encourage fluids  Continue to monitor  LOS: 9 days A FACE TO FACE EVALUATION WAS PERFORMED  Ranelle Oyster 06/11/2020, 11:58 AM

## 2020-06-11 NOTE — Progress Notes (Signed)
Speech Language Pathology Weekly Progress and Session Note  Patient Details  Name: Ann Coleman MRN: [id number] Date of Birth: June 28, 1924  Beginning of progress report period: June 03, 2020 End of progress report period: June 11, 2020  Short Term Goals: Week 1: SLP Short Term Goal 1 (Week 1): Pt will participate in continued cognitive lingutistic assesment. SLP Short Term Goal 1 - Progress (Week 1): Met SLP Short Term Goal 2 (Week 1): Pt will consume trials of thin liquids with no overt s/s aspriation over x3 treatment sessions and min A verbal cues for use of small sips and intermittent dry swallow prior to liquid upgrade. SLP Short Term Goal 2 - Progress (Week 1): Met SLP Short Term Goal 3 (Week 1): Pt consume trials of dys 3 textures with no overt s/s aspiration over x3 treatment sessions with min A verbal cues for oral clearance. SLP Short Term Goal 3 - Progress (Week 1): Progressing toward goal SLP Short Term Goal 4 (Week 1): Pt will express wants/needs at phrase/sentence level with min A verbal cues. SLP Short Term Goal 4 - Progress (Week 1): Met SLP Short Term Goal 5 (Week 1): Pt will demonstrate 80% intelligibility at the phrase/sentence level with min A verbal cues. SLP Short Term Goal 5 - Progress (Week 1): Met    New Short Term Goals: Week 2: SLP Short Term Goal 1 (Week 2): STG=LTG due to remaining LOS for ST services   Weekly Progress Updates: Pt has made functional gains and met 4 out of 5 short term goals this reporting period. Pt is currently Supervision--Min assist for basic cognitive tasks, which family confirms she is at her baseline level of cognitive functioning. Pt still with very mild dysarthria but intelligibility has improved to 90-95% in conversation, per family and interpretor, and she uses strategies to compensatory with no more than Supervision A verbal cues. Pt was upgraded to thin liquids after tolerating water protocol, and is on a dysphagia 2 (minced/fine chop)  texture diet. ST interventions will focus on dysphagia, given that all other goals were met for speech and cognition, and pt is at baseline level of cognitive functioning. Pt and family education is ongoing. Pt would continue to benefit from skilled ST while inpatient in order to maximize functional independence and reduce burden of care prior to discharge. Anticipate that pt will need 24/7 supervision at discharge. Do not anticipate pt will required ST follow up.     Intensity: Minumum of 1-2 x/day, 30 to 90 minutes Frequency: 3 to 5 out of 7 days Duration/Length of Stay: 21-25 days Treatment/Interventions: Dysphagia/aspiration precaution training;Patient/family education    Arbutus Leas 06/11/2020, 12:18 PM

## 2020-06-12 ENCOUNTER — Inpatient Hospital Stay (HOSPITAL_COMMUNITY): Payer: Medicare Other | Admitting: Speech Pathology

## 2020-06-12 ENCOUNTER — Inpatient Hospital Stay (HOSPITAL_COMMUNITY): Payer: Medicare Other | Admitting: Physical Therapy

## 2020-06-12 ENCOUNTER — Inpatient Hospital Stay (HOSPITAL_COMMUNITY): Payer: Medicare Other | Admitting: Occupational Therapy

## 2020-06-12 ENCOUNTER — Inpatient Hospital Stay (HOSPITAL_COMMUNITY): Payer: Medicare Other

## 2020-06-12 NOTE — Progress Notes (Signed)
Occupational Therapy Session Note  Patient Details  Name: Unnamed [place] MRN: [id number] Date of Birth: 25-Aug-1924  Today's Date: 06/12/2020 OT Individual Time: 1304-1405 OT Individual Time Calculation (min): 61 min    Short Term Goals: Week 2:  OT Short Term Goal 1 (Week 2): Pt will rise to stand to with min A to prep for LB dressing. OT Short Term Goal 2 (Week 2): Pt will don UB clothing with verbal cues. OT Short Term Goal 3 (Week 2): Pt will complete toilet transfers with mod A. OT Short Term Goal 4 (Week 2): Pt will don pants over feet with mod A.  Skilled Therapeutic Interventions/Progress Updates:    Pt sitting up in w/c with dtr and interpreter present.  Rehab tech present to assist with functional transfers.  Pt instructed through sinkside UB dressing and bathing sitting in w/c.  Pt doffed button down shirt with min assist for buttons and assist around back.  Pt needing mod assist to bath UB and VCs to raise RUE to reach axilla.  Hand over hand provided with cues to facilitate reaching with RUE to bathe LUE.  Pt completed sit to stand at sink however immediately self sat reporting knee pain.  Pt refusing Voltaren gel application.  Attempted sit to stand using Stedy to bathe and dress LB and pt able to complete with +2 (pt 75%) with max assist to wash buttocks, periarea and donn brief and skirt.  Pt complete sit to sidelying with +2 (pt 25%).   Neuro re-ed completed to increase muscle activation RUE and reinforce motor planning for normal movement.  Pt completed shoulder AROM FF and ABD (gravity eliminated), ER (against gravity), elbow flexion and extension (against gravity with 1/2 lb wrist weight applied), wrist extension/flexion (gravity eliminated) , and PIP jt flexion index through small, IP flexion of thumb.  Pt completed 2 x 10 reps each needing frequent VCs and TCs to increase pts visual feedback and facilitate muscle contraction. Pt reporting too tired to finish rest of session therefore  discontinued.  Call bell in reach, bed alarm on.  Pt exhibited improved strength in right biceps and triceps to 3+/5 MMT today.  Pt with increased digital flexion and extension when cued to attend to right hand.  Pt limited during functional transfers today due to knee pain and fatigue from sitting up in w/c longer than usual.  Therapy Documentation Precautions:  Precautions Precautions: Fall Precaution Comments: R hemiplegia Restrictions Weight Bearing Restrictions: No   Therapy/Group: Individual Therapy  Amie Critchley 06/12/2020, 5:06 PM

## 2020-06-12 NOTE — Progress Notes (Signed)
Speech Language Pathology Discharge Summary  Patient Details  Name: Ann Coleman MRN: [id number] Date of Birth: Sep 29, 1924  Patient has met 6 of 6 long term goals.  Patient to discharge at overall Modified Independent;Min;Supervision level.  Reasons goals not met: m/a   Clinical Impression/Discharge Summary:   Pt made functional gains and met 6 out of 6 long term goals this admission. Pt currently requires Min assist for basic cognitive tasks due to acute impairments impacting emergent awareness, baseline impairments impacting short term memory, selective attention, and problem solving. Pt is using strategies to compensate for mild residual dysarthria  for with Supervision A verbal cues and speech is 90-95% intelligible in conversation, per both familiar and unfamiliar listener reports.  Pt is consuming and upgraded dysphagia 3 (mechanical soft) diet with thin liquids and is Mod I for use of swallow precautions.   Pt and family education is complete at this time.   Care Partner:  Caregiver Able to Provide Assistance: Yes  Type of Caregiver Assistance: Cognitive  Recommendation:  24 hour supervision/assistance  Rationale for SLP Follow Up:  (n/a)   Equipment: none   Reasons for discharge: Treatment goals met   Patient/Family Agrees with Progress Made and Goals Achieved: Yes    Arbutus Leas 06/12/2020, 12:18 PM

## 2020-06-12 NOTE — Progress Notes (Signed)
[facility] PHYSICAL MEDICINE & REHABILITATION PROGRESS NOTE   Subjective/Complaints: Patient seen laying in bed this morning.  She indicates she slept well overnight.  No reported issues overnight.  No family at bedside this a.m.  ROS: limited due to language.  Objective:   No results found. No results for input(s): WBC, HGB, HCT, PLT in the last 72 hours. No results for input(s): NA, K, CL, CO2, GLUCOSE, BUN, CREATININE, CALCIUM in the last 72 hours.  Intake/Output Summary (Last 24 hours) at 06/12/2020 0908 Last data filed at 06/12/2020 0730 Gross per 24 hour  Intake 168 ml  Output --  Net 168 ml     Physical Exam: Vital Signs Blood pressure (!) 147/74, pulse (!) 58, temperature 97.8 F (36.6 C), temperature source Oral, resp. rate 16, SpO2 93 %. Constitutional: No distress . Vital signs reviewed. HENT: Normocephalic.  Atraumatic. Eyes: EOMI. No discharge. Cardiovascular: No JVD. Respiratory: Normal effort.  No stridor. GI: Non-distended. Skin: Warm and dry.  Intact. Psych: Normal mood.  Normal behavior. Musc: No edema in extremities.  No tenderness in extremities. Neuro: Alert Motor: RUE: Shoulder abduction 2/5, elbow flexion/extension 2 -/5, distally 0/5, unchanged. Right lower extremity: Hip flexion 1/5, distally 0/5, unchanged  Assessment/Plan: 1. Functional deficits secondary to Left pontine infarct  which require 3+ hours per day of interdisciplinary therapy in a comprehensive inpatient rehab setting.  Physiatrist is providing close team supervision and 24 hour management of active medical problems listed below.  Physiatrist and rehab team continue to assess barriers to discharge/monitor patient progress toward functional and medical goals  Care Tool:  Bathing    Body parts bathed by patient: Abdomen, Chest, Right arm, Right upper leg, Left upper leg, Front perineal area, Face   Body parts bathed by helper: Left arm, Left lower leg, Right lower leg Body parts  n/a: Buttocks   Bathing assist Assist Level: Moderate Assistance - Patient 50 - 74%     Upper Body Dressing/Undressing Upper body dressing   What is the patient wearing?: Pull over shirt    Upper body assist Assist Level: Moderate Assistance - Patient 50 - 74%    Lower Body Dressing/Undressing Lower body dressing      What is the patient wearing?: Underwear/pull up, Pants     Lower body assist Assist for lower body dressing: 2 Helpers     Toileting Toileting    Toileting assist Assist for toileting: Dependent - Patient 0%     Transfers Chair/bed transfer  Transfers assist     Chair/bed transfer assist level: 2 Helpers (max A of 1 and min/mod A of 2nd person via squat pivot)     Locomotion Ambulation   Ambulation assist   Ambulation activity did not occur: Safety/medical concerns (dense hemiplegia, decreased strength/activity tolerance)          Walk 10 feet activity   Assist  Walk 10 feet activity did not occur: Safety/medical concerns        Walk 50 feet activity   Assist Walk 50 feet with 2 turns activity did not occur: Safety/medical concerns         Walk 150 feet activity   Assist Walk 150 feet activity did not occur: Safety/medical concerns         Walk 10 feet on uneven surface  activity   Assist Walk 10 feet on uneven surfaces activity did not occur: Safety/medical concerns         Wheelchair     Assist Will patient  use wheelchair at discharge?: Yes (Per PT long term goals ) Type of Wheelchair: Manual Wheelchair activity did not occur: Safety/medical concerns (decreased strength/activity tolerance)         Wheelchair 50 feet with 2 turns activity    Assist    Wheelchair 50 feet with 2 turns activity did not occur: Safety/medical concerns       Wheelchair 150 feet activity     Assist  Wheelchair 150 feet activity did not occur: Safety/medical concerns   Assist Level: Dependent - Patient 0%    Blood pressure (!) 147/74, pulse (!) 58, temperature 97.8 F (36.6 C), temperature source Oral, resp. rate 16, SpO2 93 %.    Medical Problem List and Plan: 1.Right side hemiparesis and slurred speech with confusionsecondary to Acute left pontine infarction  Continue CIR  WHO/PRAFO qhs 2. Antithrombotics: -DVT/anticoagulation:Lovenox -antiplatelet therapy: Aspirin 81 mg daily and Plavix 75 mg daily x3 weeks then aspirin alone 3. Pain Management:Tylenol as needed, has Musculoskeletal CP (work-up has been negative for cardiogenic source), sportscreme  Voltaren gel ordered to bilateral knees, ?efficacy    Appears comfortable 7/13 4. Mood:Provide emotional support -antipsychotic agents: N/A 5. Neuropsych: This patientisnot fully capable of making decisions on herown behalf. 6. Skin/Wound Care:Routine skin checks 7. Fluids/Electrolytes/Nutrition:Routine in and outs with follow-up chemistries 8. Dysphagia.   Advanced to D#3 thins  Encourage P.o. intake  Follow-up speech therapy 9. Hyperlipidemia. Lipitor 10.  Labile blood pressure  Slightly labile on 7/13 Vitals:   06/11/20 1923 06/12/20 0516  BP: 129/65 (!) 147/74  Pulse: 66 (!) 58  Resp: 16 16  Temp: 98.4 F (36.9 C) 97.8 F (36.6 C)  SpO2: 94% 93%  11.  AKI  GFR 50, repeat labs later this week  Continue to encourage fluids  Continue to monitor  LOS: 10 days A FACE TO FACE EVALUATION WAS PERFORMED  Janella Rogala Karis Juba 06/12/2020, 9:08 AM

## 2020-06-12 NOTE — Progress Notes (Signed)
Speech Language Pathology Daily Session Note  Patient Details  Name: Ann Coleman MRN: [id number] Date of Birth: 1924-07-26  Today's Date: 06/12/2020 SLP Individual Time: 7062-3762 SLP Individual Time Calculation (min): 25 min  Short Term Goals: Week 2: SLP Short Term Goal 1 (Week 2): STG=LTG due to remaining LOS for ST services   Skilled Therapeutic Interventions: Pt was seen for skilled ST targeting dysphagia. Pt received in room from PT ending their session. SLP facilitated session with skilled observation of pt consuming current (and recently upgraded) dysphagia 3 texture snacks with thin liquids. Pt demonstrated improved ability to use slow rate and smaller bite size to coordinate breathing and swallowing sequence and maximize efficiency of mastication today - Mod I for use of these strategies during PO intake. One delayed cough noted during session, but not able to differentiate if linked to solid or liquid intake or not. Recommend pt continue current diet. Given that she continued to tolerate thin liquids, dysphagia 3 textures appear to be her baseline diet, and she has met all ST goals, no further ST is indicated at this time. Pt left sitting in chair with alarm set and needs within reach, interpretor still present. Continue per current plan of care.        Pain Pain Assessment Pain Scale: Faces Faces Pain Scale: No hurt  Therapy/Group: Individual Therapy  Arbutus Leas 06/12/2020, 7:23 AM

## 2020-06-12 NOTE — Progress Notes (Signed)
Occupational Therapy Session Note  Patient Details  Name: Ann Coleman MRN: [id number] Date of Birth: Oct 21, 1924  Today's Date: 06/12/2020 OT Individual Time: 1030-1130 OT Individual Time Calculation (min): 60 min    Short Term Goals: Week 2:  OT Short Term Goal 1 (Week 2): Pt will rise to stand to with min A to prep for LB dressing. OT Short Term Goal 2 (Week 2): Pt will don UB clothing with verbal cues. OT Short Term Goal 3 (Week 2): Pt will complete toilet transfers with mod A. OT Short Term Goal 4 (Week 2): Pt will don pants over feet with mod A.  Skilled Therapeutic Interventions/Progress Updates:    Pt received in w/c ready for therapy with interpretor present.  Pt taken to gym to work on dynamic balance, sit to stand, transfers, RUE NMR.   Pt completed squat pivot to mat with mod A to her R side and then mod A to scoot back on mat, bench step placed under her feet for support due to short stature.  From EOM, worked on: -dynamic sit balance with lateral weight shifts and trunk rotation -sitting on elevated mat to strengthen quads and encourage weight bearing through RLE -sit to stand with hands on support but pt unable to fully extend spine and stand tall.  - sitting worked on [employer] with UE ranger, B hands on ball to squeeze ball and reach ball forward with mod A, grasp and release of cones with mod support at forearm. Pt has developing grasp but minimal finger extension.  Pt completed squat pivot with mod A back to chair and max cues. Pt taken back to room and set up in wc to prepare for lunch. Pt requested to go back to bed but session time had ended, so notified NT of pt's request.     Chair alarm on.   Therapy Documentation Precautions:  Precautions Precautions: Fall Precaution Comments: R hemiplegia Restrictions Weight Bearing Restrictions: No     Pain: Pain Assessment Pain Scale: Faces Faces Pain Scale: No hurt ADL: ADL Eating: Minimal assistance Where  Assessed-Eating: Bed level Grooming: Minimal assistance Where Assessed-Grooming: Chair Upper Body Bathing: Moderate assistance, Moderate cueing Where Assessed-Upper Body Bathing: Sitting at sink Lower Body Bathing: Maximal assistance Where Assessed-Lower Body Bathing: Chair Upper Body Dressing: Maximal assistance Where Assessed-Upper Body Dressing: Sitting at sink Lower Body Dressing: Dependent Where Assessed-Lower Body Dressing: Sitting at sink Toileting: Dependent Where Assessed-Toileting: Bedside Commode Toilet Transfer: Maximal verbal cueing, Dependent Toilet Transfer Method: Stand pivot Toilet Transfer Equipment: Bedside commode Tub/Shower Transfer: Unable to assess Psychologist, counselling Transfer: Unable to assess   Therapy/Group: Individual Therapy  Breland Elders 06/12/2020, 12:44 PM

## 2020-06-12 NOTE — Progress Notes (Signed)
Physical Therapy Session Note  Patient Details  Name: Ann Coleman MRN: [id number] Date of Birth: [birth year]  Today's Date: 06/12/2020 PT Individual Time: 8891-6945 PT Individual Time Calculation (min): 26 min   Short Term Goals: Week 2:  PT Short Term Goal 1 (Week 2): Pt will perform supine<>sit with max assist of 1 person PT Short Term Goal 2 (Week 2): Pt will perform bed<>chair transfers with max assist of 1 person (+2 can be present for safety) PT Short Term Goal 3 (Week 2): Pt will perform sit<>stands with +2 mod assist PT Short Term Goal 4 (Week 2): Pt will initiate gait training using harness support as needed  Skilled Therapeutic Interventions/Progress Updates:   Pt received supine in bed and agreeable to therapy session. In-person interpreter, [place], present during entire session. Supine>sitting L EOB with max multimodal cuing for sequencing of logroll technique to increase pt independence with heavy mod assist for R hemibody management and trunk upright. Sitting EOB pt initially requires mod/max assist for trunk control to prevent posterior LOB but with max multimodal cuing and time for pt to be comfortable (pt has fear of falling forward off bed) progressed to constant min assist. Donned socks total assist. Sit>stand EOM>stedy with +2 mod assist for lifting into standing and balance due to R lean (assist for maintaining R hand grasp on stedy bar but pt able to initiate placing hand up there). Stedy transfer to sink for [employer] - upon questioning pt reports needing to use bathroom. Sit<>stand to/from stedy seat with mod assist of 1 person. Standing with mod assist of 1 while +2 assist performed total assist LB cloting management and peri-care - continent of bowels and bladder. Pt left seated tilted back in TIS w/c with needs in reach, seat belt alarm on, and pillows positioned for R hemibody support and neck support.   Therapy Documentation Precautions:  Precautions Precautions:  Fall Precaution Comments: R hemiplegia Restrictions Weight Bearing Restrictions: No  Pain:   No reports of pain throughout session.   Therapy/Group: Individual Therapy  Ginny Forth , PT, DPT, CSRS  06/12/2020, 7:50 AM

## 2020-06-13 ENCOUNTER — Inpatient Hospital Stay (HOSPITAL_COMMUNITY): Payer: Medicare Other | Admitting: Occupational Therapy

## 2020-06-13 ENCOUNTER — Inpatient Hospital Stay (HOSPITAL_COMMUNITY): Payer: Medicare Other | Admitting: Physical Therapy

## 2020-06-13 NOTE — Progress Notes (Signed)
[facility] PHYSICAL MEDICINE & REHABILITATION PROGRESS NOTE   Subjective/Complaints: Patient seen laying in bed this morning.  She states she slept "fine" overnight.  No reported issues overnight.  No family at bedside this morning.  ROS: limited due to language.  Objective:   No results found. No results for input(s): WBC, HGB, HCT, PLT in the last 72 hours. No results for input(s): NA, K, CL, CO2, GLUCOSE, BUN, CREATININE, CALCIUM in the last 72 hours.  Intake/Output Summary (Last 24 hours) at 06/13/2020 0900 Last data filed at 06/13/2020 0840 Gross per 24 hour  Intake 125 ml  Output --  Net 125 ml     Physical Exam: Vital Signs Blood pressure 132/62, pulse 64, temperature 98.1 F (36.7 C), temperature source Oral, resp. rate 17, SpO2 94 %. Constitutional: No distress . Vital signs reviewed. HENT: Normocephalic.  Atraumatic. Eyes: EOMI. No discharge. Cardiovascular: No JVD. Respiratory: Normal effort.  No stridor. GI: Non-distended. Skin: Warm and dry.  Intact. Psych: Normal mood.  Normal behavior. Musc: No edema in extremities.  No tenderness in extremities. Neuro: Alert Motor: RUE: Shoulder abduction 2/5, elbow flexion/extension 2 -/5, distally 0/5, appears stable Right lower extremity: Hip flexion 1/5, distally 0/5, appears stable  Assessment/Plan: 1. Functional deficits secondary to Left pontine infarct  which require 3+ hours per day of interdisciplinary therapy in a comprehensive inpatient rehab setting.  Physiatrist is providing close team supervision and 24 hour management of active medical problems listed below.  Physiatrist and rehab team continue to assess barriers to discharge/monitor patient progress toward functional and medical goals  Care Tool:  Bathing    Body parts bathed by patient: Abdomen, Chest, Right arm, Face   Body parts bathed by helper: Left arm, Buttocks, Front perineal area Body parts n/a: Right upper leg, Left upper leg, Right lower  leg, Left lower leg   Bathing assist Assist Level: Moderate Assistance - Patient 50 - 74%     Upper Body Dressing/Undressing Upper body dressing   What is the patient wearing?: Button up shirt, Pull over shirt    Upper body assist Assist Level: Minimal Assistance - Patient > 75%    Lower Body Dressing/Undressing Lower body dressing      What is the patient wearing?: Underwear/pull up, Skirt     Lower body assist Assist for lower body dressing: 2 Helpers     Toileting Toileting    Toileting assist Assist for toileting: Dependent - Patient 0%     Transfers Chair/bed transfer  Transfers assist     Chair/bed transfer assist level: 2 Helpers (max A of 1 and min/mod A of 2nd person via squat pivot)     Locomotion Ambulation   Ambulation assist   Ambulation activity did not occur: Safety/medical concerns (dense hemiplegia, decreased strength/activity tolerance)          Walk 10 feet activity   Assist  Walk 10 feet activity did not occur: Safety/medical concerns        Walk 50 feet activity   Assist Walk 50 feet with 2 turns activity did not occur: Safety/medical concerns         Walk 150 feet activity   Assist Walk 150 feet activity did not occur: Safety/medical concerns         Walk 10 feet on uneven surface  activity   Assist Walk 10 feet on uneven surfaces activity did not occur: Safety/medical concerns         Wheelchair     Assist  Will patient use wheelchair at discharge?: Yes (Per PT long term goals ) Type of Wheelchair: Manual Wheelchair activity did not occur: Safety/medical concerns (decreased strength/activity tolerance)         Wheelchair 50 feet with 2 turns activity    Assist    Wheelchair 50 feet with 2 turns activity did not occur: Safety/medical concerns       Wheelchair 150 feet activity     Assist  Wheelchair 150 feet activity did not occur: Safety/medical concerns   Assist Level:  Dependent - Patient 0%   Blood pressure 132/62, pulse 64, temperature 98.1 F (36.7 C), temperature source Oral, resp. rate 17, SpO2 94 %.    Medical Problem List and Plan: 1.Right side hemiparesis and slurred speech with confusionsecondary to Acute left pontine infarction  Continue CIR  Team conference today to discuss current and goals and coordination of care, home and environmental barriers, and discharge planning with nursing, case manager, and therapies.   WHO/PRAFO qhs 2. Antithrombotics: -DVT/anticoagulation:Lovenox -antiplatelet therapy: Aspirin 81 mg daily and Plavix 75 mg daily x3 weeks then aspirin alone 3. Pain Management:Tylenol as needed, has Musculoskeletal CP (work-up has been negative for cardiogenic source), sportscreme  Voltaren gel ordered to bilateral knees, ?efficacy    Appears controlled on 7/14 4. Mood:Provide emotional support -antipsychotic agents: N/A 5. Neuropsych: This patientisnot fully capable of making decisions on herown behalf. 6. Skin/Wound Care:Routine skin checks 7. Fluids/Electrolytes/Nutrition:Routine in and outs with follow-up chemistries 8. Dysphagia.   Advanced to D#3 thins  Encourage P.o. intake  Follow-up speech therapy 9. Hyperlipidemia. Lipitor 10.  Labile blood pressure  Slightly labile on 7/14, but?  Improving Vitals:   06/12/20 1947 06/13/20 0608  BP: (!) 150/89 132/62  Pulse: 68 64  Resp: 18 17  Temp: 98.2 F (36.8 C) 98.1 F (36.7 C)  SpO2: 95% 94%  11.  AKI  GFR 50, repeat labs later this week  Continue to encourage fluids  Continue to monitor  LOS: 11 days A FACE TO FACE EVALUATION WAS PERFORMED  Aeric Burnham Karis Juba 06/13/2020, 9:00 AM

## 2020-06-13 NOTE — Progress Notes (Signed)
Physical Therapy Session Note  Patient Details  Name: Ann Coleman MRN: [id number] Date of Birth: [birth year]  Today's Date: 06/13/2020 PT Individual Time: 0903-1000 PT Individual Time Calculation (min): 57 min   Short Term Goals: Week 2:  PT Short Term Goal 1 (Week 2): Pt will perform supine<>sit with max assist of 1 person PT Short Term Goal 2 (Week 2): Pt will perform bed<>chair transfers with max assist of 1 person (+2 can be present for safety) PT Short Term Goal 3 (Week 2): Pt will perform sit<>stands with +2 mod assist PT Short Term Goal 4 (Week 2): Pt will initiate gait training using harness support as needed  Skilled Therapeutic Interventions/Progress Updates: Pt presents supine in bed, and agreeable to therapy.  Pt rolls side to side w/ min A and use of siderails.  Pt required mod A for L sidelying to sit  And then able to maintain seated balance w/ LUE support and then w/o UE support.  Pt required min A to scoot to EOB.  Pt performed squat pivot transfer bed > w/c / max A, as well as w/c <> mat table later in therapy session.  Pt wheeled to gym for time conservation.  Pt tolerated standing frame > 5' x 3 trials, reaching forward, reaching across midline for pegs, manual assist for RUE.  Encouraged to maintain trunk upright during reaches.  Pt transferred w/c > mat table and 1 3/4" platform under feet to maintain balance.  Ptinitially retropulsing, but eventually able to maintain.  Pt returned to room in TIS w/c and recliner back w/ chair alarm on and all needs in reach.  Interpreter present throughout session, remains at conclusion.     Therapy Documentation Precautions:  Precautions Precautions: Fall Precaution Comments: R hemiplegia Restrictions Weight Bearing Restrictions: No General:   Vital Signs:   Pain:no c/o.   Mobility:      Therapy/Group: Individual Therapy  Lucio Edward 06/13/2020, 10:42 AM

## 2020-06-13 NOTE — Progress Notes (Signed)
Patient ID: Ann Coleman, female   DOB: 1924-02-12, 86 y.o.   MRN: [id number] Team Conference Report to Patient/Family  Team Conference discussion was reviewed with the patient and caregiver, including goals, any changes in plan of care and target discharge date.  Patient and caregiver express understanding and are in agreement.  The patient has a target discharge date of 06/22/20.  Andria Rhein 06/13/2020, 2:08 PM

## 2020-06-13 NOTE — Progress Notes (Signed)
Occupational Therapy Session Note  Patient Details  Name: Ann Coleman MRN: [id number] Date of Birth: 1924/01/19  Today's Date: 06/13/2020 OT Individual Time: 1258-1410 OT Individual Time Calculation (min): 72 min    Short Term Goals: Week 2:  OT Short Term Goal 1 (Week 2): Pt will rise to stand to with min A to prep for LB dressing. OT Short Term Goal 2 (Week 2): Pt will don UB clothing with verbal cues. OT Short Term Goal 3 (Week 2): Pt will complete toilet transfers with mod A. OT Short Term Goal 4 (Week 2): Pt will don pants over feet with mod A.  Skilled Therapeutic Interventions/Progress Updates:    Pt sleeping in w/c with dtr and interpretor present.  Rehab tech present for partial session to assist as needed.  Pt completed Squat pivot w/c to EOM with +2 (max assist plus min assist from second person).  Pt completed functional reach task in unsupported sitting using RUE to grasp and release horseshoes in various planes.  Pt needing intermittent assist to extend digits.  Pt completed squat pivot EOM to w/c with +2 (max assist plus CGA from second person).  Pt repositioned with min assist in chair.  Pt completed static standing task at standing frame with functional reaching LUE and weightbearing through RUE and facilitation of weight shifting side to side.  Pt returned to room and complete Stedy transfer with min assist sit<>stand and one person to transport to EOB.  Sit to supine completed with mod assist today. Repositioned toward [facility] with +2.  Isolated right wrist extension by blocking digits into composite flexion and completed 3 x 10 reps in gravity eliminated plane. Call bell in reach, bed alarm on. Improved ability to reposition pelvis back in w/c today needing less assistance.  Pt using RUE with progressively improved functionality.  Wrist and digital extension weakest needing further strengthening neuro re-ed measures.  Therapy Documentation Precautions:  Precautions Precautions:  Fall Precaution Comments: R hemiplegia Restrictions Weight Bearing Restrictions: No   Therapy/Group: Individual Therapy  Amie Critchley 06/13/2020, 4:49 PM

## 2020-06-13 NOTE — Progress Notes (Signed)
Occupational Therapy Session Note  Patient Details  Name: Ann Coleman MRN: [id number] Date of Birth: 01/22/1924  Today's Date: 06/13/2020 OT Individual Time: 1030-1130 OT Individual Time Calculation (min): 60 min    Short Term Goals: Week 2:  OT Short Term Goal 1 (Week 2): Pt will rise to stand to with min A to prep for LB dressing. OT Short Term Goal 2 (Week 2): Pt will don UB clothing with verbal cues. OT Short Term Goal 3 (Week 2): Pt will complete toilet transfers with mod A. OT Short Term Goal 4 (Week 2): Pt will don pants over feet with mod A.  Skilled Therapeutic Interventions/Progress Updates:    Pt received in w/c with interpretor present. Pt agreeable to going to the gym to work on her R arm and standing.  Pt used unweighted dowel bar with hand over hand A to guide her R arm with a push pull exercises. Positioned at table to use R arm on skate board which pt was able to use with min A to guide her movement patterns.  Attempted standing at the table, but pt unable to extend her knees and back and was in a very flexed position. To support her R knee, placed pt in a standard Stedy and she pulled herself to stand with min A and able to come upright to move the pads of the stedy in.  Using the stedy for standing balance, pt practiced moving from semisit to stand several times, standing for 3 seconds at a time, progressing to 20 seconds 1x.  To encourage trunk elongation and reach,  Had pt practice reaching overhead with L hand and this really did help pt to lift taller and she rose to stand without needing to be cued.  Transferred back to w.c and pt needed to toilet.  Returned to room and completed squat pivot to toilet to her R side with mod A, sit to stand with L hand on w.c arm rest for support with mod A.  Therapist able to get pants down and pt sat to have a BM and urinate. She was able to self cleanse from sitting.  Needed +2 A from NT to pull up new brief and pants with therapist  supporting pt in standing.  Pt sat back onto toilet and then did a squat pivot to w.c to her L with mod A.  Pt resting in wc with all needs met. NT applying belt alarm.  Therapy Documentation Precautions:  Precautions Precautions: Fall Precaution Comments: R hemiplegia Restrictions Weight Bearing Restrictions: No    Vital Signs: Therapy Vitals Temp: 98.1 F (36.7 C) Temp Source: Oral Pulse Rate: 64 Resp: 17 BP: 132/62 Patient Position (if appropriate): Lying Oxygen Therapy SpO2: 94 % O2 Device: Room Air Pain: Pain Assessment Pain Scale: 0-10 Pain Score: 0-No pain       Therapy/Group: Individual Therapy  SAGUIER,JULIA 06/13/2020, 8:31 AM  

## 2020-06-13 NOTE — Patient Care Conference (Signed)
Inpatient RehabilitationTeam Conference and Plan of Care Update Date: 06/13/2020   Time: 2:34 PM    Patient Name: Ann Coleman      Medical Record Number: [id number]  Date of Birth: 12/06/1923 Sex: Female         Room/Bed: 4W25C/4W25C-01 Payor Info: Payor: MEDICARE / Plan: MEDICARE PART A AND B / Product Type: *No Product type* /    Admit Date/Time:  06/02/2020  3:56 PM  Primary Diagnosis:  Brainstem infarct, acute [facility])  Hospital Problems: Principal Problem:   Brainstem infarct, acute ([place]) Active Problems:   Left pontine cerebrovascular accident ([place])   AKI (acute kidney injury) ([place])   Chest pressure   Essential hypertension   Labile blood pressure   Dysphagia, post-stroke   Chronic pain of both knees   Right hemiparesis [facility])    Expected Discharge Date: Expected Discharge Date: 06/22/20  Team Members Present: Physician leading conference: Dr. Delice Lesch Care Coodinator Present: Dorien Chihuahua, RN, BSN, CRRN Nurse Present: Renda Rolls, LPN PT Present: Lavone Nian, PT OT Present: Leretha Pol, OT SLP Present: Jettie Booze, CF-SLP PPS Coordinator present : Gunnar Fusi, SLP     Current Status/Progress Goal Weekly Team Focus  Bowel/Bladder   //Pt is incontient of b/b, LBM 06/12/20  Pt will regain continence of B/B  Q2h toileting/ PRN   Swallow/Nutrition/ Hydration   Dysphagia 3 textures, thin liquid, Mod I (Dys 3 is baseline diet)  Mod I  d/c from ST goals met   ADL's   mod A squat pivot, min- mod with stedy, mod UB self care; max- total with LB self care, developing RUE active use  min assist  RUE NMR, balance, functional transfers, ADL training   Mobility   mod/max assist supine<>sit, initially requires constant min assist for sitting balance due to posterior lean, +2 max assist sit<>stand, +2 mod assist sit<>stand using stedy, max A of 1 and min assist of 2nd person for squat pivot transfers, unable to progress to ambulation at this time  Min-Mod assist, WC supervision  goals  bed mobility, sitting balance/trunk control, sit<>stand and bed<>chair transfer training, OOB activity tolerance, pt education and awareness of impairments, R hemibody NMR with R LE weightbearing   Communication   Supervision  Supervision  d/c from [place] - goals met   Safety/Cognition/ Behavioral Observations  Min A  Min A  at baseline for memory, attention, problem solving, goals met for emergent awareness - d/c from ST   Pain   Pt denies having pain at this time.  Pt will remain pain free  assess pain qshift/PRN   Skin   Bruising to the Abd  skin will be free of infection and breakdown  Assess skin qshift/PRN     Team Discussion:  Discharge Planning/Teaching Needs:  Goal to discharge back home with DTR  Will schedule with dtr if recc.   Current Update:    Current Barriers to Discharge:  Limited home access: no rails for steps to entry of home  Possible Resolutions to Barriers: Educate family on bumping wheelchair up steps/ install ramp for steps  Patient on target to meet rehab goals: yes  *See Care Plan and progress notes for long and short-term goals.   Revisions to Treatment Plan:  Discharged from SLP as cognition at baseline and premorbid D3 soft solid diet achieved    Medical Summary Current Status: Right side hemiparesis and slurred speech with confusion secondary to Acute left pontine infarction Weekly Focus/Goal: Improve mobility, swallowing, AKI  Barriers  to Discharge: Medical stability   Possible Resolutions to Barriers: Therapies, encourage fluids, follow labs, advance diet as tolerated   Continued Need for Acute Rehabilitation Level of Care: The patient requires daily medical management by a physician with specialized training in physical medicine and rehabilitation for the following reasons: Direction of a multidisciplinary physical rehabilitation program to maximize functional independence : Yes Medical management of patient stability for increased  activity during participation in an intensive rehabilitation regime.: Yes Analysis of laboratory values and/or radiology reports with any subsequent need for medication adjustment and/or medical intervention. : Yes   I attest that I was present, lead the team conference, and concur with the assessment and plan of the team.   Dorien Chihuahua B 06/13/2020, 2:34 PM

## 2020-06-14 ENCOUNTER — Inpatient Hospital Stay (HOSPITAL_COMMUNITY): Payer: Medicare Other | Admitting: Occupational Therapy

## 2020-06-14 ENCOUNTER — Inpatient Hospital Stay (HOSPITAL_COMMUNITY): Payer: Medicare Other | Admitting: *Deleted

## 2020-06-14 MED ORDER — LIDOCAINE 5 % EX PTCH
2.0000 | MEDICATED_PATCH | Freq: Every day | CUTANEOUS | Status: DC
  Administered 2020-06-14 – 2020-06-22 (×9): 2 via TRANSDERMAL
  Filled 2020-06-14 (×9): qty 2

## 2020-06-14 NOTE — Progress Notes (Signed)
Physical Therapy Session Note  Patient Details  Name: Ann Coleman MRN: [id number] Date of Birth: August 06, 1924  Today's Date: 06/14/2020 PT Individual Time: 0904-1000 PT Individual Time Calculation (min): 56 min   Short Term Goals: Week 2:  PT Short Term Goal 1 (Week 2): Pt will perform supine<>sit with max assist of 1 person PT Short Term Goal 2 (Week 2): Pt will perform bed<>chair transfers with max assist of 1 person (+2 can be present for safety) PT Short Term Goal 3 (Week 2): Pt will perform sit<>stands with +2 mod assist PT Short Term Goal 4 (Week 2): Pt will initiate gait training using harness support as needed  Skilled Therapeutic Interventions/Progress Updates: Pt presented in bed with interpreter present agreeable to therapy. Pt states pain in B knees but recently medicated. Performed supine to sit with modA and minA to scoot to EOB. Pt indicated need for bathroom. Performed STS in [place] with minA and facilitation at hips with PTA holding R hand to maintain on Stedy. Pt transported to bathroom and had continent BM/void at toilet. Pt required modA for stand from toilet and PTA performed peri-care to ensure cleanliness. Pt then transferred to [place] and transported to ortho gym. Pt participated in block practice squat pivot transfers to/from mat with placed under pt's feet for support. Pt required modA to scoot to R but progressed to minA transferring to L. Pt performed better with cues to use LLE and to boost up and over. Pt transferred back to room at end of session and remained in TIS as next session in 30 min. Pt left with belt alarm on, call bell within reach and needs met.      Therapy Documentation Precautions:  Precautions Precautions: Fall Precaution Comments: R hemiplegia Restrictions Weight Bearing Restrictions: No General:   Vital Signs: Therapy Vitals Temp: 97.9 F (36.6 C) Pulse Rate: 72 Resp: 18 BP: (!) 136/112 Patient Position (if appropriate): Sitting Oxygen  Therapy SpO2: 98 % O2 Device: Room Air Pain: Pain Assessment Pain Scale: Faces Pain Score: 0-No pain   Therapy/Group: Individual Therapy  Alysse Rathe  Hashim Eichhorst, PTA  06/14/2020, 1:00 PM

## 2020-06-14 NOTE — Progress Notes (Signed)
Occupational Therapy Session Note  Patient Details  Name: Whitleigh Whitworth MRN: [id number] Date of Birth: 03-04-1924  Today's Date: 06/14/2020 OT Individual Time: 1030-1130 OT Individual Time Calculation (min): 60 min    Short Term Goals: Week 1:  OT Short Term Goal 1 (Week 1): Pt will don UB clothing with mod A OT Short Term Goal 1 - Progress (Week 1): Met OT Short Term Goal 2 (Week 1): Pt will sit EOB for ADL with no more than mod A for sitting balance OT Short Term Goal 2 - Progress (Week 1): Met OT Short Term Goal 3 (Week 1): Pt will demonstrate improved R attention with no more than mod cueing for scanning during ADL task OT Short Term Goal 3 - Progress (Week 1): Met OT Short Term Goal 4 (Week 1): Pt and caregiver will correctly position her RUE at rest with min A OT Short Term Goal 4 - Progress (Week 1): Progressing toward goal Week 2:  OT Short Term Goal 1 (Week 2): Pt will rise to stand to with min A to prep for LB dressing. OT Short Term Goal 2 (Week 2): Pt will don UB clothing with verbal cues. OT Short Term Goal 3 (Week 2): Pt will complete toilet transfers with mod A. OT Short Term Goal 4 (Week 2): Pt will don pants over feet with mod A.  Skilled Therapeutic Interventions/Progress Updates:    Pt received in wc with interpretor present. Pt taken to gym to focus on balance, mobility and RUE NMR.  Pt completed a sq pivot to her R side with mod A to a low bench that allowed her to sit with hips at 90 degree flexion (pt is only 4'11").  From this position, she had improved postural control and held her balance in all directions without difficult. Worked on sit to stand with L hand placed on back of arm chair for support and therapist in front of pt supporting her body wt and blocking R knee. Pt able to rise to stand with min -mod A and hold stand with mod A for up to 20 seconds for a total of 5 x. Each stand, input given to pt through her sternum and R glute to facilitate upright posture.  Pt  able to achieve this position today which is a big improvement.   From sitting on low bench, worked on dynamic reaching across midline with mod A to facilitate R UE to reach and grasp objects. Worked on B hands on ball with moving it forward and back to encourage forward wt shift.    Pt transferred back to wc with squat pivot and positioned at high/low table for a/arom of RUE on towel with min-mod A to guide her arm through various angles of movement on the table.    Pt taken back to the room, belt alarm on and all needs met.    Therapy Documentation Precautions:  Precautions Precautions: Fall Precaution Comments: R hemiplegia Restrictions Weight Bearing Restrictions: No Therapy Vitals Temp: 97.9 F (36.6 C) Pulse Rate: 72 Resp: 18 BP: (!) 136/112 Patient Position (if appropriate): Sitting Oxygen Therapy SpO2: 98 % O2 Device: Room Air Pain: Pain Assessment Pain Score: 0-No pain ADL: ADL Eating: Minimal assistance Where Assessed-Eating: Bed level Grooming: Minimal assistance Where Assessed-Grooming: Chair Upper Body Bathing: Moderate assistance, Moderate cueing Where Assessed-Upper Body Bathing: Sitting at sink Lower Body Bathing: Maximal assistance Where Assessed-Lower Body Bathing: Chair Upper Body Dressing: Maximal assistance Where Assessed-Upper Body Dressing: Sitting at sink Lower  Body Dressing: Dependent Where Assessed-Lower Body Dressing: Sitting at sink Toileting: Dependent Where Assessed-Toileting: Bedside Commode Toilet Transfer: Maximal verbal cueing, Dependent Toilet Transfer Method: Stand pivot Toilet Transfer Equipment: Bedside commode Tub/Shower Transfer: Unable to assess Social research officer, government: Unable to assess  Therapy/Group: Individual Therapy  [place] 06/14/2020, 4:11 PM

## 2020-06-14 NOTE — Progress Notes (Signed)
Occupational Therapy Session Note  Patient Details  Name: Ann Coleman MRN: [id number] Date of Birth: 02-02-1924  Today's Date: 06/14/2020 OT Individual Time: 1257-1406 OT Individual Time Calculation (min): 69 min    Short Term Goals: Week 2:  OT Short Term Goal 1 (Week 2): Pt will rise to stand to with min A to prep for LB dressing. OT Short Term Goal 2 (Week 2): Pt will don UB clothing with verbal cues. OT Short Term Goal 3 (Week 2): Pt will complete toilet transfers with mod A. OT Short Term Goal 4 (Week 2): Pt will don pants over feet with mod A.  Skilled Therapeutic Interventions/Progress Updates:    Pt sitting up in w/c with dtr and interpretor present.  Rehab tech present for partial visit to assist as needed.  Pt agreeable to bathing in shower today.  Sit to stand at [facility] with min assist of one person.  Pt transported to toilet and toileting completed with max assist no episode of continence noted.  Toilet transfer at stedy completed with min assist.  Pt completed UB dressing with SBA and min VCs for compensatory technique donning/doffing overhead.  Pt needing mod assist for UB and LB bathing, although increased active use of RUE during bathing noted including reaching across body to initiate washing LUE.  Pt completed LB dressing with max assist for brief and skirt management donning/doffing. Pt requesting back to bed.  Stand to sit at EOB completed with CGA.  Sit to supine with mod assist and max VCs for body mechanics.    Pt participated in neuro re-ed of RUE focusing on biceps/triceps, wrist extensors, and digital extensors/flexors completed.  Pt tolerated .75# wrist weight against gravity for biceps and triceps.  Isolation of wrist extension by blocking digits and TCs to faciliate increased muscle contraction.  Pt needing frequent intermittent VCs to attend to RUE to increase motor control.  Pt completed PIP joint blocking index through small.  2 x 10 reps each.  Bed alarm on, call bell in  reach.  Therapy Documentation Precautions:  Precautions Precautions: Fall Precaution Comments: R hemiplegia Restrictions Weight Bearing Restrictions: No   Therapy/Group: Individual Therapy  Amie Critchley 06/14/2020, 12:44 PM

## 2020-06-14 NOTE — Progress Notes (Signed)
[place] PHYSICAL MEDICINE & REHABILITATION PROGRESS NOTE   Subjective/Complaints: Patient seen laying in bed this AM.  She indicates she slept well overnight.  She appears to deny complaints.  No family at bedside.   ROS: limited due to language.  Objective:   No results found. No results for input(s): WBC, HGB, HCT, PLT in the last 72 hours. No results for input(s): NA, K, CL, CO2, GLUCOSE, BUN, CREATININE, CALCIUM in the last 72 hours.  Intake/Output Summary (Last 24 hours) at 06/14/2020 1016 Last data filed at 06/13/2020 1759 Gross per 24 hour  Intake 50 ml  Output --  Net 50 ml     Physical Exam: Vital Signs Blood pressure (!) 145/59, pulse 60, temperature 97.8 F (36.6 C), resp. rate 15, SpO2 95 %.  Constitutional: No distress . Vital signs reviewed. HENT: Normocephalic.  Atraumatic. Eyes: EOMI. No discharge. Cardiovascular: No JVD. Respiratory: Normal effort.  No stridor. GI: Non-distended. Skin: Warm and dry.  Intact. Psych: Normal mood.  Normal behavior. Musc: No edema in extremities.  No tenderness in extremities. Neuro: Alert Motor: RUE: Shoulder abduction 2/5, elbow flexion/extension 2 -/5, distally 2+/5 Right lower extremity: Hip flexion 2/5, Knee extension 2/5, distally 0/5  Assessment/Plan: 1. Functional deficits secondary to Left pontine infarct  which require 3+ hours per day of interdisciplinary therapy in a comprehensive inpatient rehab setting.  Physiatrist is providing close team supervision and 24 hour management of active medical problems listed below.  Physiatrist and rehab team continue to assess barriers to discharge/monitor patient progress toward functional and medical goals  Care Tool:  Bathing    Body parts bathed by patient: Abdomen, Chest, Right arm, Face   Body parts bathed by helper: Left arm, Buttocks, Front perineal area Body parts n/a: Right upper leg, Left upper leg, Right lower leg, Left lower leg   Bathing assist Assist  Level: Moderate Assistance - Patient 50 - 74%     Upper Body Dressing/Undressing Upper body dressing   What is the patient wearing?: Button up shirt, Pull over shirt    Upper body assist Assist Level: Minimal Assistance - Patient > 75%    Lower Body Dressing/Undressing Lower body dressing      What is the patient wearing?: Underwear/pull up, Skirt     Lower body assist Assist for lower body dressing: 2 Helpers     Toileting Toileting    Toileting assist Assist for toileting: 2 Helpers     Transfers Chair/bed transfer  Transfers assist     Chair/bed transfer assist level: Total Assistance - Patient < 25%     Locomotion Ambulation   Ambulation assist   Ambulation activity did not occur: Safety/medical concerns (dense hemiplegia, decreased strength/activity tolerance)          Walk 10 feet activity   Assist  Walk 10 feet activity did not occur: Safety/medical concerns        Walk 50 feet activity   Assist Walk 50 feet with 2 turns activity did not occur: Safety/medical concerns         Walk 150 feet activity   Assist Walk 150 feet activity did not occur: Safety/medical concerns         Walk 10 feet on uneven surface  activity   Assist Walk 10 feet on uneven surfaces activity did not occur: Safety/medical concerns         Wheelchair     Assist Will patient use wheelchair at discharge?: Yes (Per PT long term goals )  Type of Wheelchair: Manual Wheelchair activity did not occur: Safety/medical concerns (decreased strength/activity tolerance)         Wheelchair 50 feet with 2 turns activity    Assist    Wheelchair 50 feet with 2 turns activity did not occur: Safety/medical concerns       Wheelchair 150 feet activity     Assist  Wheelchair 150 feet activity did not occur: Safety/medical concerns   Assist Level: Dependent - Patient 0%   Blood pressure (!) 145/59, pulse 60, temperature 97.8 F (36.6 C), resp.  rate 15, SpO2 95 %.    Medical Problem List and Plan: 1.Right side hemiparesis and slurred speech with confusionsecondary to Acute left pontine infarction  Continue CIR  WHO/PRAFO qhs 2. Antithrombotics: -DVT/anticoagulation:Lovenox  CBC ordered for tomorrow -antiplatelet therapy: Aspirin 81 mg daily and Plavix 75 mg daily x3 weeks then aspirin alone 3. Pain Management:Tylenol as needed, has Musculoskeletal CP (work-up has been negative for cardiogenic source), sportscreme  Voltaren gel ordered to bilateral knees, ?efficacy  Lidoderm patch ordered on 7/15 4. Mood:Provide emotional support -antipsychotic agents: N/A 5. Neuropsych: This patientisnot fully capable of making decisions on herown behalf. 6. Skin/Wound Care:Routine skin checks 7. Fluids/Electrolytes/Nutrition:Routine in and outs with follow-up chemistries 8. Dysphagia.   Advanced to D#3 thins  Encourage P.o. intake  Follow-up speech therapy 9. Hyperlipidemia. Lipitor 10.  Labile blood pressure  Relatively controlled on 7/15 Vitals:   06/13/20 1951 06/14/20 0419  BP: (!) 147/71 (!) 145/59  Pulse: 72 60  Resp: 16 15  Temp: 97.6 F (36.4 C) 97.8 F (36.6 C)  SpO2: 95% 95%  11.  AKI  GFR 50, labs ordered for tomorrow  Continue to encourage fluids  Continue to monitor  LOS: 12 days A FACE TO FACE EVALUATION WAS PERFORMED  Cynethia Schindler Karis Juba 06/14/2020, 10:16 AM

## 2020-06-15 ENCOUNTER — Inpatient Hospital Stay (HOSPITAL_COMMUNITY): Payer: Medicare Other | Admitting: Occupational Therapy

## 2020-06-15 ENCOUNTER — Inpatient Hospital Stay (HOSPITAL_COMMUNITY): Payer: Medicare Other

## 2020-06-15 DIAGNOSIS — R030 Elevated blood-pressure reading, without diagnosis of hypertension: Secondary | ICD-10-CM

## 2020-06-15 DIAGNOSIS — N182 Chronic kidney disease, stage 2 (mild): Secondary | ICD-10-CM

## 2020-06-15 LAB — CBC WITH DIFFERENTIAL/PLATELET
Abs Immature Granulocytes: 0.01 10*3/uL (ref 0.00–0.07)
Basophils Absolute: 0 10*3/uL (ref 0.0–0.1)
Basophils Relative: 1 %
Eosinophils Absolute: 0.2 10*3/uL (ref 0.0–0.5)
Eosinophils Relative: 4 %
HCT: 38.5 % (ref 36.0–46.0)
Hemoglobin: 12.1 g/dL (ref 12.0–15.0)
Immature Granulocytes: 0 %
Lymphocytes Relative: 36 %
Lymphs Abs: 1.8 10*3/uL (ref 0.7–4.0)
MCH: 26.4 pg (ref 26.0–34.0)
MCHC: 31.4 g/dL (ref 30.0–36.0)
MCV: 84.1 fL (ref 80.0–100.0)
Monocytes Absolute: 0.5 10*3/uL (ref 0.1–1.0)
Monocytes Relative: 11 %
Neutro Abs: 2.5 10*3/uL (ref 1.7–7.7)
Neutrophils Relative %: 48 %
Platelets: 200 10*3/uL (ref 150–400)
RBC: 4.58 MIL/uL (ref 3.87–5.11)
RDW: 13.5 % (ref 11.5–15.5)
WBC: 5 10*3/uL (ref 4.0–10.5)
nRBC: 0 % (ref 0.0–0.2)

## 2020-06-15 LAB — BASIC METABOLIC PANEL
Anion gap: 8 (ref 5–15)
BUN: 12 mg/dL (ref 8–23)
CO2: 24 mmol/L (ref 22–32)
Calcium: 8.5 mg/dL — ABNORMAL LOW (ref 8.9–10.3)
Chloride: 108 mmol/L (ref 98–111)
Creatinine, Ser: 0.86 mg/dL (ref 0.44–1.00)
GFR calc Af Amer: >60 mL/min (ref >60)
GFR calc non Af Amer: 58 mL/min — ABNORMAL LOW (ref >60)
Glucose, Bld: 106 mg/dL — ABNORMAL HIGH (ref 70–99)
Potassium: 4.2 mmol/L (ref 3.5–5.1)
Sodium: 140 mmol/L (ref 135–145)

## 2020-06-15 MED ORDER — LISINOPRIL 2.5 MG PO TABS
2.5000 mg | ORAL_TABLET | Freq: Every day | ORAL | Status: DC
  Administered 2020-06-15 – 2020-06-17 (×3): 2.5 mg via ORAL
  Filled 2020-06-15 (×3): qty 1

## 2020-06-15 NOTE — Progress Notes (Signed)
[place] PHYSICAL MEDICINE & REHABILITATION PROGRESS NOTE   Subjective/Complaints: Patient seen laying in bed this morning.  She appears to indicate she did not sleep well overnight.  No family at bedside.  ROS: limited due to language  Objective:   No results found. Recent Labs    06/15/20 0648  WBC 5.0  HGB 12.1  HCT 38.5  PLT 200   Recent Labs    06/15/20 0648  NA 140  K 4.2  CL 108  CO2 24  GLUCOSE 106*  BUN 12  CREATININE 0.86  CALCIUM 8.5*    Intake/Output Summary (Last 24 hours) at 06/15/2020 1200 Last data filed at 06/15/2020 0900 Gross per 24 hour  Intake 358 ml  Output --  Net 358 ml     Physical Exam: Vital Signs Blood pressure (!) 161/83, pulse 65, temperature 97.9 F (36.6 C), resp. rate 18, SpO2 94 %.  Constitutional: No distress . Vital signs reviewed. HENT: Normocephalic.  Atraumatic. Eyes: EOMI. No discharge. Cardiovascular: No JVD. Respiratory: Normal effort.  No stridor. GI: Non-distended. Skin: Warm and dry.  Intact. Psych: Normal mood.  Normal behavior. Musc: No edema in extremities.  No tenderness in extremities. Neuro: Alert Motor: RUE: Shoulder abduction 2/5, elbow flexion/extension 2 -/5, distally 2+/5, unchanged Right lower extremity: Hip flexion 2/5, Knee extension 2/5, distally 0/5, unchanged  Assessment/Plan: 1. Functional deficits secondary to Left pontine infarct  which require 3+ hours per day of interdisciplinary therapy in a comprehensive inpatient rehab setting.  Physiatrist is providing close team supervision and 24 hour management of active medical problems listed below.  Physiatrist and rehab team continue to assess barriers to discharge/monitor patient progress toward functional and medical goals  Care Tool:  Bathing    Body parts bathed by patient: Abdomen, Chest, Right arm, Face, Right upper leg, Left upper leg, Front perineal area   Body parts bathed by helper: Left arm, Left lower leg, Right lower  leg Body parts n/a: Buttocks   Bathing assist Assist Level: Moderate Assistance - Patient 50 - 74%     Upper Body Dressing/Undressing Upper body dressing   What is the patient wearing?: Pull over shirt    Upper body assist Assist Level: Contact Guard/Touching assist    Lower Body Dressing/Undressing Lower body dressing      What is the patient wearing?: Incontinence brief, Skirt     Lower body assist Assist for lower body dressing: Total Assistance - Patient < 25%     Toileting Toileting    Toileting assist Assist for toileting: Total Assistance - Patient < 25%     Transfers Chair/bed transfer  Transfers assist     Chair/bed transfer assist level: Total Assistance - Patient < 25%     Locomotion Ambulation   Ambulation assist   Ambulation activity did not occur: Safety/medical concerns (dense hemiplegia, decreased strength/activity tolerance)          Walk 10 feet activity   Assist  Walk 10 feet activity did not occur: Safety/medical concerns        Walk 50 feet activity   Assist Walk 50 feet with 2 turns activity did not occur: Safety/medical concerns         Walk 150 feet activity   Assist Walk 150 feet activity did not occur: Safety/medical concerns         Walk 10 feet on uneven surface  activity   Assist Walk 10 feet on uneven surfaces activity did not occur: Safety/medical concerns  Wheelchair     Assist Will patient use wheelchair at discharge?: Yes (Per PT long term goals ) Type of Wheelchair: Manual Wheelchair activity did not occur: Safety/medical concerns (decreased strength/activity tolerance)         Wheelchair 50 feet with 2 turns activity    Assist    Wheelchair 50 feet with 2 turns activity did not occur: Safety/medical concerns       Wheelchair 150 feet activity     Assist  Wheelchair 150 feet activity did not occur: Safety/medical concerns   Assist Level: Dependent - Patient  0%   Blood pressure (!) 161/83, pulse 65, temperature 97.9 F (36.6 C), resp. rate 18, SpO2 94 %.    Medical Problem List and Plan: 1.Right side hemiparesis and slurred speech with confusionsecondary to Acute left pontine infarction  Continue CIR  WHO/PRAFO qhs 2. Antithrombotics: -DVT/anticoagulation:Lovenox  CBC within normal limits on 7/16 -antiplatelet therapy: Aspirin 81 mg daily and Plavix 75 mg daily x3 weeks then aspirin alone 3. Pain Management:Tylenol as needed, has Musculoskeletal CP (work-up has been negative for cardiogenic source), sportscreme  Voltaren gel ordered to bilateral knees, ?efficacy  Lidoderm patch ordered on 7/15  ?Improvement 4. Mood:Provide emotional support -antipsychotic agents: N/A 5. Neuropsych: This patientisnot fully capable of making decisions on herown behalf. 6. Skin/Wound Care:Routine skin checks 7. Fluids/Electrolytes/Nutrition:Routine in and outs with follow-up chemistries 8. Dysphagia.   Advanced to D#3 thins  Encourage P.o. intake  Follow-up speech therapy 9. Hyperlipidemia. Lipitor 10.  Labile blood pressure  Trending up on 7/16  Lisinopril 2.5 started on 7/16 Vitals:   06/14/20 2022 06/15/20 0559  BP: (!) 159/50 (!) 161/83  Pulse: 67 65  Resp: 18 18  Temp: 98.4 F (36.9 C) 97.9 F (36.6 C)  SpO2: 96% 94%  11.  AKI versus CKD stage II/III  GFR 58 on 7/16  Continue to encourage fluids  Continue to monitor  LOS: 13 days A FACE TO FACE EVALUATION WAS PERFORMED  Leonardo Makris Karis Juba 06/15/2020, 12:00 PM

## 2020-06-15 NOTE — Progress Notes (Signed)
Occupational Therapy Session Note  Patient Details  Name: [name] MRN: [id number] Date of Birth: 12/29/1923  Today's Date: 06/15/2020 OT Individual Time: 1300-1409 OT Individual Time Calculation (min): 69 min    Short Term Goals: Week 2:  OT Short Term Goal 1 (Week 2): Pt will rise to stand to with min A to prep for LB dressing. OT Short Term Goal 2 (Week 2): Pt will don UB clothing with verbal cues. OT Short Term Goal 3 (Week 2): Pt will complete toilet transfers with mod A. OT Short Term Goal 4 (Week 2): Pt will don pants over feet with mod A.  Skilled Therapeutic Interventions/Progress Updates:    Pt supine just having finished lunch, no c/o pain, agreeable to OT session. Pt dtr and interpretor present.  Rehab tech assisted partial treatment.  Pt completed supine to sit with min assist with step by step VCs for body mechanics and sequencing.  Pt completed squat pivot transfer with mod assist and CGA from second helper.  [place] UB dressing and bathing completed with pt able to doff shirt overhead with supervision and washed UB with mod assist for LUE and back.  Pt donned shirt with min assist using hemi technique.  Pt stood at stedy to complete LB dressing and to bathe buttocks with max assist. Pt assisted in pulling brief and pants up and down over hips.  Pt reporting shortness of breath therefore vitals assessed.  BP 136/87; pulse 80; O2 92%.  After RB, pt agreeable to standing at RW with left knee blocked and using splint handle on right needing min assist.  Pt completed squat pivot w/c to EOB and sit to supine with max assist due to fatigue.  Neuro re-ed completed to right wrist extension against gravity with TCs and VCs to facilitate increased right attention and muscle activation.  Pt completed 2 x 10 reps full AROM.  Call bell in reach, bed alarm on.  Therapy Documentation Precautions:  Precautions Precautions: Fall Precaution Comments: R hemiplegia Restrictions Weight Bearing  Restrictions: No   Therapy/Group: Individual Therapy  Amie Critchley 06/15/2020, 5:19 PM

## 2020-06-15 NOTE — Progress Notes (Signed)
Occupational Therapy Session Note  Patient Details  Name: Ann Coleman MRN: [id number] Date of Birth: 1923/12/16  Today's Date: 06/15/2020 OT Individual Time: 1030-1130 OT Individual Time Calculation (min): 60 min    Short Term Goals: Week 1:  OT Short Term Goal 1 (Week 1): Pt will don UB clothing with mod A OT Short Term Goal 1 - Progress (Week 1): Met OT Short Term Goal 2 (Week 1): Pt will sit EOB for ADL with no more than mod A for sitting balance OT Short Term Goal 2 - Progress (Week 1): Met OT Short Term Goal 3 (Week 1): Pt will demonstrate improved R attention with no more than mod cueing for scanning during ADL task OT Short Term Goal 3 - Progress (Week 1): Met OT Short Term Goal 4 (Week 1): Pt and caregiver will correctly position her RUE at rest with min A OT Short Term Goal 4 - Progress (Week 1): Progressing toward goal Week 2:  OT Short Term Goal 1 (Week 2): Pt will rise to stand to with min A to prep for LB dressing. OT Short Term Goal 2 (Week 2): Pt will don UB clothing with verbal cues. OT Short Term Goal 3 (Week 2): Pt will complete toilet transfers with mod A. OT Short Term Goal 4 (Week 2): Pt will don pants over feet with mod A.      Skilled Therapeutic Interventions/Progress Updates:    Pt received in bed with interpretor present. Pt stated she was wet.  Pt worked on rolling as therapist cleansed pt and changed her brief.  Pt sat to EOB with min A.  Able to hold sitting balance unsupported.  With +2 for safety, pt completed squat pivot to TIS wc and then was taken down to gym. Changed out her wc to a standard w/c. Pt transferred to that seat and was able to maneuver herself to scoot back with min A.  Focused session on sit to stands, standing tolerance at cabinets to use counter top for support. Able to rise to stand with mod A and hold stand with R knee supported for 30 seconds at a time.   Hand over hand guiding to facilitate RUE active movement patterns and to grasp small  blocks.    Pt taken back to the room and requested to get back to bed.  Pt in bed with all needs met. Bed alarm set.   Therapy Documentation Precautions:  Precautions Precautions: Fall Precaution Comments: R hemiplegia Restrictions Weight Bearing Restrictions: No  Pain: Pain Assessment Pain Scale: 0-10 Pain Score: 0-No pain ADL: ADL Eating: Minimal assistance Where Assessed-Eating: Bed level Grooming: Minimal assistance Where Assessed-Grooming: Chair Upper Body Bathing: Moderate assistance, Moderate cueing Where Assessed-Upper Body Bathing: Sitting at sink Lower Body Bathing: Maximal assistance Where Assessed-Lower Body Bathing: Chair Upper Body Dressing: Maximal assistance Where Assessed-Upper Body Dressing: Sitting at sink Lower Body Dressing: Dependent Where Assessed-Lower Body Dressing: Sitting at sink Toileting: Dependent Where Assessed-Toileting: Bedside Commode Toilet Transfer: Maximal verbal cueing, Dependent Toilet Transfer Method: Stand pivot Toilet Transfer Equipment: Bedside commode Tub/Shower Transfer: Unable to assess Gaffer Transfer: Unable to assess   Therapy/Group: Individual Therapy  [place] 06/15/2020, 12:50 PM

## 2020-06-16 ENCOUNTER — Inpatient Hospital Stay (HOSPITAL_COMMUNITY): Payer: Medicare Other | Admitting: Occupational Therapy

## 2020-06-16 ENCOUNTER — Inpatient Hospital Stay (HOSPITAL_COMMUNITY): Payer: Medicare Other

## 2020-06-16 NOTE — Plan of Care (Signed)
  Problem: Consults °Goal: RH STROKE PATIENT EDUCATION °Description: See Patient Education module for education specifics  °Outcome: Progressing °Goal: Nutrition Consult-if indicated °Outcome: Progressing °  °Problem: RH BOWEL ELIMINATION °Goal: RH STG MANAGE BOWEL WITH ASSISTANCE °Description: STG Manage Bowel with  mod I Assistance. °Outcome: Progressing °Goal: RH STG MANAGE BOWEL W/MEDICATION W/ASSISTANCE °Description: STG Manage Bowel with Medication with  mod I Assistance. °Outcome: Progressing °  °Problem: RH BLADDER ELIMINATION °Goal: RH STG MANAGE BLADDER WITH ASSISTANCE °Description: STG Manage Bladder With  min Assistance °Outcome: Progressing °Goal: RH STG MANAGE BLADDER WITH MEDICATION WITH ASSISTANCE °Description: STG Manage Bladder With Medication With mod I Assistance. °Outcome: Progressing °Goal: RH STG MANAGE BLADDER WITH EQUIPMENT WITH ASSISTANCE °Description: STG Manage Bladder With Equipment With min Assistance °Outcome: Progressing °  °Problem: RH SKIN INTEGRITY °Goal: RH STG SKIN FREE OF INFECTION/BREAKDOWN °Description: Manage with min assist °Outcome: Progressing °Goal: RH STG MAINTAIN SKIN INTEGRITY WITH ASSISTANCE °Description: STG Maintain Skin Integrity With Assistance. °Outcome: Progressing °  °Problem: RH SAFETY °Goal: RH STG ADHERE TO SAFETY PRECAUTIONS W/ASSISTANCE/DEVICE °Description: STG Adhere to Safety Precautions With  cues/reminders Assistance/Device. °Outcome: Progressing °Goal: RH STG DECREASED RISK OF FALL WITH ASSISTANCE °Description: STG Decreased Risk of Fall With  cues/reminders Assistance. °Outcome: Progressing °  °Problem: RH COGNITION-NURSING °Goal: RH STG USES MEMORY AIDS/STRATEGIES W/ASSIST TO PROBLEM SOLVE °Description: STG Uses Memory Aids/Strategies With  cues/reminders Assistance to Problem Solve. °Outcome: Progressing °Goal: RH STG ANTICIPATES NEEDS/CALLS FOR ASSIST W/ASSIST/CUES °Description: STG Anticipates Needs/Calls for Assist With cues/reminders  Assistance/Cues. °Outcome: Progressing °  °Problem: RH PAIN MANAGEMENT °Goal: RH STG PAIN MANAGED AT OR BELOW PT'S PAIN GOAL °Description: At or below level 4 °Outcome: Progressing °  °Problem: RH KNOWLEDGE DEFICIT °Goal: RH STG INCREASE KNOWLEDGE OF HYPERTENSION °Description: Patient's daughter will be able to manage HTN using medications and diet restrictions using handouts and educational information with cues/reminders °Outcome: Progressing °Goal: RH STG INCREASE KNOWLEDGE OF DYSPHAGIA/FLUID INTAKE °Description: Patient's daughter will be able to manage dysphagia  diet restrictions using handouts and educational information with cues/reminders °Outcome: Progressing °Goal: RH STG INCREASE KNOWLEDGE OF STROKE PROPHYLAXIS °Description: Patient's daughter will be able to manage secondary stroke prevention using medications and diet restrictions using handouts and educational information with cues/reminders °Outcome: Progressing °  °

## 2020-06-16 NOTE — Plan of Care (Signed)
Problem: RH BOWEL ELIMINATION Goal: RH STG MANAGE BOWEL WITH ASSISTANCE Description: STG Manage Bowel with  mod I Assistance. 06/16/2020 0130 by Carylon Perches, LPN Outcome: Progressing 06/16/2020 0129 by Carylon Perches, LPN Outcome: Progressing Goal: RH STG MANAGE BOWEL W/MEDICATION W/ASSISTANCE Description: STG Manage Bowel with Medication with  mod I Assistance. 06/16/2020 0130 by Carylon Perches, LPN Outcome: Progressing 06/16/2020 0129 by Carylon Perches, LPN Outcome: Progressing   Problem: RH BLADDER ELIMINATION Goal: RH STG MANAGE BLADDER WITH ASSISTANCE Description: STG Manage Bladder With  min Assistance 06/16/2020 0130 by Carylon Perches, LPN Outcome: Progressing 06/16/2020 0129 by Carylon Perches, LPN Outcome: Progressing Goal: RH STG MANAGE BLADDER WITH MEDICATION WITH ASSISTANCE Description: STG Manage Bladder With Medication With mod I Assistance. 06/16/2020 0130 by Carylon Perches, LPN Outcome: Progressing 06/16/2020 0129 by Carylon Perches, LPN Outcome: Progressing Goal: RH STG MANAGE BLADDER WITH EQUIPMENT WITH ASSISTANCE Description: STG Manage Bladder With Equipment With min Assistance 06/16/2020 0130 by Carylon Perches, LPN Outcome: Progressing 06/16/2020 0129 by Carylon Perches, LPN Outcome: Progressing   Problem: RH BLADDER ELIMINATION Goal: RH STG MANAGE BLADDER WITH MEDICATION WITH ASSISTANCE Description: STG Manage Bladder With Medication With mod I Assistance. 06/16/2020 0130 by Carylon Perches, LPN Outcome: Progressing 06/16/2020 0129 by Carylon Perches, LPN Outcome: Progressing   Problem: RH SKIN INTEGRITY Goal: RH STG SKIN FREE OF INFECTION/BREAKDOWN Description: Manage with min assist 06/16/2020 0130 by Carylon Perches, LPN Outcome: Progressing 06/16/2020 0129 by Carylon Perches, LPN Outcome: Progressing Goal: RH STG MAINTAIN SKIN INTEGRITY WITH ASSISTANCE Description: STG Maintain Skin Integrity With  Assistance. 06/16/2020 0130 by Carylon Perches, LPN Outcome: Progressing 06/16/2020 0129 by Carylon Perches, LPN Outcome: Progressing   Problem: RH SAFETY Goal: RH STG ADHERE TO SAFETY PRECAUTIONS W/ASSISTANCE/DEVICE Description: STG Adhere to Safety Precautions With  cues/reminders Assistance/Device. 06/16/2020 0130 by Carylon Perches, LPN Outcome: Progressing 06/16/2020 0129 by Carylon Perches, LPN Outcome: Progressing Goal: RH STG DECREASED RISK OF FALL WITH ASSISTANCE Description: STG Decreased Risk of Fall With  cues/reminders Assistance. 06/16/2020 0130 by Carylon Perches, LPN Outcome: Progressing 06/16/2020 0129 by Carylon Perches, LPN Outcome: Progressing   Problem: RH COGNITION-NURSING Goal: RH STG USES MEMORY AIDS/STRATEGIES W/ASSIST TO PROBLEM SOLVE Description: STG Uses Memory Aids/Strategies With  cues/reminders Assistance to Problem Solve. 06/16/2020 0130 by Carylon Perches, LPN Outcome: Progressing 06/16/2020 0129 by Carylon Perches, LPN Outcome: Progressing Goal: RH STG ANTICIPATES NEEDS/CALLS FOR ASSIST W/ASSIST/CUES Description: STG Anticipates Needs/Calls for Assist With cues/reminders Assistance/Cues. 06/16/2020 0130 by Carylon Perches, LPN Outcome: Progressing 06/16/2020 0129 by Carylon Perches, LPN Outcome: Progressing   Problem: RH PAIN MANAGEMENT Goal: RH STG PAIN MANAGED AT OR BELOW PT'S PAIN GOAL Description: At or below level 4 06/16/2020 0130 by Carylon Perches, LPN Outcome: Progressing 06/16/2020 0129 by Carylon Perches, LPN Outcome: Progressing   Problem: RH KNOWLEDGE DEFICIT Goal: RH STG INCREASE KNOWLEDGE OF HYPERTENSION Description: Patient's daughter will be able to manage HTN using medications and diet restrictions using handouts and educational information with cues/reminders 06/16/2020 0130 by Carylon Perches, LPN Outcome: Progressing 06/16/2020 0129 by Carylon Perches, LPN Outcome: Progressing Goal: RH STG INCREASE  KNOWLEDGE OF DYSPHAGIA/FLUID INTAKE Description: Patient's daughter will be able to manage dysphagia  diet restrictions using handouts and educational information with cues/reminders 06/16/2020 0130 by Carylon Perches, LPN Outcome: Progressing 06/16/2020 0129 by Carylon Perches, LPN Outcome: Progressing Goal: RH STG INCREASE  KNOWLEDGE OF STROKE PROPHYLAXIS Description: Patient's daughter will be able to manage secondary stroke prevention using medications and diet restrictions using handouts and educational information with cues/reminders 06/16/2020 0130 by Carylon Perches, LPN Outcome: Progressing 06/16/2020 0129 by Carylon Perches, LPN Outcome: Progressing

## 2020-06-16 NOTE — Progress Notes (Signed)
Physical Therapy Session Note  Patient Details  Name: Ann Coleman MRN: [id number] Date of Birth: 1924-05-24  Today's Date: 06/16/2020 PT Individual Time:  -      Short Term Goals: Week 1:  PT Short Term Goal 1 (Week 1): Patient to be able to perform all bed mobility with modAx1 and use of bed features PT Short Term Goal 1 - Progress (Week 1): Progressing toward goal PT Short Term Goal 2 (Week 1): Patient to be able to complete sliding board transfers with MaxAx1 and standby of second person for safety PT Short Term Goal 2 - Progress (Week 1): Progressing toward goal PT Short Term Goal 3 (Week 1): Patient to tolerate standing for 3 minutes in standing frame PT Short Term Goal 3 - Progress (Week 1): Met PT Short Term Goal 4 (Week 1): Patient to initiate gait training PT Short Term Goal 4 - Progress (Week 1): Progressing toward goal Week 2:  PT Short Term Goal 1 (Week 2): Pt will perform supine<>sit with max assist of 1 person PT Short Term Goal 2 (Week 2): Pt will perform bed<>chair transfers with max assist of 1 person (+2 can be present for safety) PT Short Term Goal 3 (Week 2): Pt will perform sit<>stands with +2 mod assist PT Short Term Goal 4 (Week 2): Pt will initiate gait training using harness support as needed Week 3:     Skilled Therapeutic Interventions/Progress Updates:    PAIN  Pt initially sleeping in R sidelying, easily aroused.  Daughter in room and observed session, acted to translate during session.   Pt Rolls to L w/mod assist, side to sit from L w/mod assist.   Mod assist to scoot in sitting.  CGA for static sitting on edge of bed w/feet supported. Worked on sit to side on elbow to sit to L side x 5 reps w/min assist, cues for midline. When going to R side, mod assist for side to upright.   Upright to reaching forwards toward floor and return to upright w/min to mod assist x 6 SPT bed to wc to L w/max assist of 1, cues for sequencing.    Pt transported to gym for  continued session.  In parallel bars worked on standing midline orientation/core strengthening.  STS w/max assist of 1, poor control at R hip/knee/ankle  and distractions to R side - flesion and rotation of trunk to R. W/reaching activity set up to L pt w/improved upright posture, overall max assist.   Pt c/o pain in R knee as limiting factor for standing tolerance.  WC to mat squat pvt transfer w/max assist of 1. Sitting  Balance and RUE facilitation as follows: Reaching across body to L w/RUE hand over hand facilitation to grasp clothespins, use pincer grip, and place on bars of barbox w/return to midline between each effort.   Repeated w/LUE reaching to R and return to midline.  Overall max assist w/UE, min assist w/balance.  At end of session, pt transported to room.  Pt left oob in wc w/alarm belt set and needs in reach.   Daughter at pt side.   Therapy Documentation Precautions:  Precautions Precautions: Fall Precaution Comments: R hemiplegia Restrictions Weight Bearing Restrictions: No    Therapy/Group: Individual Therapy  Callie Fielding, [place] 06/16/2020, 7:50 AM

## 2020-06-16 NOTE — Progress Notes (Signed)
[facility] PHYSICAL MEDICINE & REHABILITATION PROGRESS NOTE   Subjective/Complaints: No problems reported by staff. Pt comfortable. No pain  ROS: limited due to language/communication    Objective:   No results found. Recent Labs    06/15/20 0648  WBC 5.0  HGB 12.1  HCT 38.5  PLT 200   Recent Labs    06/15/20 0648  NA 140  K 4.2  CL 108  CO2 24  GLUCOSE 106*  BUN 12  CREATININE 0.86  CALCIUM 8.5*    Intake/Output Summary (Last 24 hours) at 06/16/2020 1149 Last data filed at 06/16/2020 0810 Gross per 24 hour  Intake 220 ml  Output --  Net 220 ml     Physical Exam: Vital Signs Blood pressure (!) 155/72, pulse 71, temperature 97.8 F (36.6 C), temperature source Oral, resp. rate 18, SpO2 92 %.  Constitutional: No distress . Vital signs reviewed. HEENT: EOMI, oral membranes moist Neck: supple Cardiovascular: RRR without murmur. No JVD    Respiratory/Chest: CTA Bilaterally without wheezes or rales. Normal effort    GI/Abdomen: BS +, non-tender, non-distended Ext: no clubbing, cyanosis, or edema Psych: pleasant and cooperative Musc: No edema in extremities.  No tenderness in extremities. Neuro: Alert Motor: RUE: Shoulder abduction 2/5, elbow flexion/extension 2 -/5, distally 2+/5, unchanged Right lower extremity: Hip flexion 2/5, Knee extension 2/5, distally 0/5, unchanged Moves left side without limitations  Assessment/Plan: 1. Functional deficits secondary to Left pontine infarct  which require 3+ hours per day of interdisciplinary therapy in a comprehensive inpatient rehab setting.  Physiatrist is providing close team supervision and 24 hour management of active medical problems listed below.  Physiatrist and rehab team continue to assess barriers to discharge/monitor patient progress toward functional and medical goals  Care Tool:  Bathing    Body parts bathed by patient: Abdomen, Chest, Right arm, Face, Right upper leg, Left upper leg, Front  perineal area   Body parts bathed by helper: Left arm, Left lower leg, Right lower leg Body parts n/a: Buttocks   Bathing assist Assist Level: Moderate Assistance - Patient 50 - 74%     Upper Body Dressing/Undressing Upper body dressing   What is the patient wearing?: Pull over shirt    Upper body assist Assist Level: Contact Guard/Touching assist    Lower Body Dressing/Undressing Lower body dressing      What is the patient wearing?: Incontinence brief, Skirt     Lower body assist Assist for lower body dressing: Total Assistance - Patient < 25%     Toileting Toileting    Toileting assist Assist for toileting: Total Assistance - Patient < 25%     Transfers Chair/bed transfer  Transfers assist     Chair/bed transfer assist level: Total Assistance - Patient < 25%     Locomotion Ambulation   Ambulation assist   Ambulation activity did not occur: Safety/medical concerns (dense hemiplegia, decreased strength/activity tolerance)          Walk 10 feet activity   Assist  Walk 10 feet activity did not occur: Safety/medical concerns        Walk 50 feet activity   Assist Walk 50 feet with 2 turns activity did not occur: Safety/medical concerns         Walk 150 feet activity   Assist Walk 150 feet activity did not occur: Safety/medical concerns         Walk 10 feet on uneven surface  activity   Assist Walk 10 feet on uneven surfaces  activity did not occur: Safety/medical concerns         Wheelchair     Assist Will patient use wheelchair at discharge?: Yes (Per PT long term goals ) Type of Wheelchair: Manual Wheelchair activity did not occur: Safety/medical concerns (decreased strength/activity tolerance)         Wheelchair 50 feet with 2 turns activity    Assist    Wheelchair 50 feet with 2 turns activity did not occur: Safety/medical concerns       Wheelchair 150 feet activity     Assist  Wheelchair 150 feet  activity did not occur: Safety/medical concerns   Assist Level: Dependent - Patient 0%   Blood pressure (!) 155/72, pulse 71, temperature 97.8 F (36.6 C), temperature source Oral, resp. rate 18, SpO2 92 %.    Medical Problem List and Plan: 1.Right side hemiparesis and slurred speech with confusionsecondary to Acute left pontine infarction  Continue CIR  WHO/PRAFO qhs 2. Antithrombotics: -DVT/anticoagulation:Lovenox  CBC within normal limits on 7/16 -antiplatelet therapy: Aspirin 81 mg daily and Plavix 75 mg daily x3 weeks then aspirin alone 3. Pain Management:Tylenol as needed, has Musculoskeletal CP (work-up has been negative for cardiogenic source), sportscreme  Voltaren gel ordered to bilateral knees, ?efficacy  Lidoderm patch ordered on 7/15  Seems improved 4. Mood:Provide emotional support -antipsychotic agents: N/A 5. Neuropsych: This patientisnot fully capable of making decisions on herown behalf. 6. Skin/Wound Care:Routine skin checks 7. Fluids/Electrolytes/Nutrition:Routine in and outs with follow-up chemistries 8. Dysphagia.   Advanced to D#3 thins  Encourage P.o. intake  Follow-up speech therapy 9. Hyperlipidemia. Lipitor 10.  Labile blood pressure  Trending up on 7/16  Lisinopril 2.5 started on 7/16--observe on 7.17 Vitals:   06/16/20 0527 06/16/20 0942  BP: (!) 145/77 (!) 155/72  Pulse: 71   Resp: 18   Temp: 97.8 F (36.6 C)   SpO2: 92%   11.  AKI versus CKD stage II/III  GFR 58 on 7/16  Continue to encourage fluids  Continue to monitor  LOS: 14 days A FACE TO FACE EVALUATION WAS PERFORMED  Ranelle Oyster 06/16/2020, 11:49 AM

## 2020-06-16 NOTE — Plan of Care (Signed)
°  Problem: Consults Goal: RH STROKE PATIENT EDUCATION Description: See Patient Education module for education specifics  Outcome: Progressing Goal: Nutrition Consult-if indicated Outcome: Progressing   Problem: RH BOWEL ELIMINATION Goal: RH STG MANAGE BOWEL WITH ASSISTANCE Description: STG Manage Bowel with  mod I Assistance. Outcome: Progressing Goal: RH STG MANAGE BOWEL W/MEDICATION W/ASSISTANCE Description: STG Manage Bowel with Medication with  mod I Assistance. Outcome: Progressing   Problem: RH BLADDER ELIMINATION Goal: RH STG MANAGE BLADDER WITH ASSISTANCE Description: STG Manage Bladder With  min Assistance Outcome: Progressing Goal: RH STG MANAGE BLADDER WITH MEDICATION WITH ASSISTANCE Description: STG Manage Bladder With Medication With mod I Assistance. Outcome: Progressing Goal: RH STG MANAGE BLADDER WITH EQUIPMENT WITH ASSISTANCE Description: STG Manage Bladder With Equipment With min Assistance Outcome: Progressing   Problem: RH SKIN INTEGRITY Goal: RH STG SKIN FREE OF INFECTION/BREAKDOWN Description: Manage with min assist Outcome: Progressing Goal: RH STG MAINTAIN SKIN INTEGRITY WITH ASSISTANCE Description: STG Maintain Skin Integrity With Assistance. Outcome: Progressing   Problem: RH SAFETY Goal: RH STG ADHERE TO SAFETY PRECAUTIONS W/ASSISTANCE/DEVICE Description: STG Adhere to Safety Precautions With  cues/reminders Assistance/Device. Outcome: Progressing Goal: RH STG DECREASED RISK OF FALL WITH ASSISTANCE Description: STG Decreased Risk of Fall With  cues/reminders Assistance. Outcome: Progressing   Problem: RH COGNITION-NURSING Goal: RH STG USES MEMORY AIDS/STRATEGIES W/ASSIST TO PROBLEM SOLVE Description: STG Uses Memory Aids/Strategies With  cues/reminders Assistance to Problem Solve. Outcome: Progressing Goal: RH STG ANTICIPATES NEEDS/CALLS FOR ASSIST W/ASSIST/CUES Description: STG Anticipates Needs/Calls for Assist With cues/reminders  Assistance/Cues. Outcome: Progressing   Problem: RH PAIN MANAGEMENT Goal: RH STG PAIN MANAGED AT OR BELOW PT'S PAIN GOAL Description: At or below level 4 Outcome: Progressing   Problem: RH KNOWLEDGE DEFICIT Goal: RH STG INCREASE KNOWLEDGE OF HYPERTENSION Description: Patient's daughter will be able to manage HTN using medications and diet restrictions using handouts and educational information with cues/reminders Outcome: Progressing Goal: RH STG INCREASE KNOWLEDGE OF DYSPHAGIA/FLUID INTAKE Description: Patient's daughter will be able to manage dysphagia  diet restrictions using handouts and educational information with cues/reminders Outcome: Progressing Goal: RH STG INCREASE KNOWLEDGE OF STROKE PROPHYLAXIS Description: Patient's daughter will be able to manage secondary stroke prevention using medications and diet restrictions using handouts and educational information with cues/reminders Outcome: Progressing

## 2020-06-16 NOTE — Progress Notes (Signed)
Occupational Therapy Session Note  Patient Details  Name: Ann Coleman MRN: [id number] Date of Birth: 26-Sep-1924  Today's Date: 06/16/2020 OT Individual Time: 1530-1600 OT Individual Time Calculation (min): 30 min    Short Term Goals: Week 1:  OT Short Term Goal 1 (Week 1): Pt will don UB clothing with mod A OT Short Term Goal 1 - Progress (Week 1): Met OT Short Term Goal 2 (Week 1): Pt will sit EOB for ADL with no more than mod A for sitting balance OT Short Term Goal 2 - Progress (Week 1): Met OT Short Term Goal 3 (Week 1): Pt will demonstrate improved R attention with no more than mod cueing for scanning during ADL task OT Short Term Goal 3 - Progress (Week 1): Met OT Short Term Goal 4 (Week 1): Pt and caregiver will correctly position her RUE at rest with min A OT Short Term Goal 4 - Progress (Week 1): Progressing toward goal Week 2:  OT Short Term Goal 1 (Week 2): Pt will rise to stand to with min A to prep for LB dressing. OT Short Term Goal 2 (Week 2): Pt will don UB clothing with verbal cues. OT Short Term Goal 3 (Week 2): Pt will complete toilet transfers with mod A. OT Short Term Goal 4 (Week 2): Pt will don pants over feet with mod A.     Skilled Therapeutic Interventions/Progress Updates:    Pt seen this session for NMR to facilitate standing balance needed for completing toileting tasks. Pt received in wc with dtr present. Pt did not need to toilet but worked on setting up room environment to simulate her home BR set up (wall on R, sink vanity on L).  Pt practiced numerous sit to stands with only min A to rise to stand with L hand on "counter" which was her dresser in the room. Once standing (about 1 min at a time) pt had improved upright posture and able to stand upright with less cues.  Slight input through R knee for support.   Demonstrated to dtr how she can do this at home. Also demonstrated RUE a/arom exercises.  Pt transferred back to bed squat pivot min A used reciprocal  scoots with min A to move hips back further on bed as her feet did not touch the floor.    Pt adjusted in bed in sidelying to her comfort level with all needs met.   Bed alarm set.    Therapy Documentation Precautions:  Precautions Precautions: Fall Precaution Comments: R hemiplegia Restrictions Weight Bearing Restrictions: No    Vital Signs: Therapy Vitals Temp: 97.6 F (36.4 C) Pulse Rate: 67 Resp: 18 BP: (!) 152/90 Patient Position (if appropriate): Sitting Oxygen Therapy SpO2: (!) 83 % Pain: Pain Assessment Pain Score: 0-No pain    Therapy/Group: Individual Therapy  [place] 06/16/2020, 4:38 PM

## 2020-06-17 DIAGNOSIS — I10 Essential (primary) hypertension: Secondary | ICD-10-CM

## 2020-06-17 MED ORDER — LISINOPRIL 2.5 MG PO TABS
2.5000 mg | ORAL_TABLET | Freq: Once | ORAL | Status: AC
  Administered 2020-06-17: 2.5 mg via ORAL
  Filled 2020-06-17: qty 1

## 2020-06-17 MED ORDER — LISINOPRIL 5 MG PO TABS
5.0000 mg | ORAL_TABLET | Freq: Every day | ORAL | Status: DC
  Administered 2020-06-18 – 2020-06-19 (×2): 5 mg via ORAL
  Filled 2020-06-17 (×2): qty 1

## 2020-06-17 NOTE — Plan of Care (Signed)
  Problem: Consults °Goal: RH STROKE PATIENT EDUCATION °Description: See Patient Education module for education specifics  °Outcome: Progressing °Goal: Nutrition Consult-if indicated °Outcome: Progressing °  °Problem: RH BOWEL ELIMINATION °Goal: RH STG MANAGE BOWEL WITH ASSISTANCE °Description: STG Manage Bowel with  mod I Assistance. °Outcome: Progressing °Goal: RH STG MANAGE BOWEL W/MEDICATION W/ASSISTANCE °Description: STG Manage Bowel with Medication with  mod I Assistance. °Outcome: Progressing °  °Problem: RH BLADDER ELIMINATION °Goal: RH STG MANAGE BLADDER WITH ASSISTANCE °Description: STG Manage Bladder With  min Assistance °Outcome: Progressing °Goal: RH STG MANAGE BLADDER WITH MEDICATION WITH ASSISTANCE °Description: STG Manage Bladder With Medication With mod I Assistance. °Outcome: Progressing °Goal: RH STG MANAGE BLADDER WITH EQUIPMENT WITH ASSISTANCE °Description: STG Manage Bladder With Equipment With min Assistance °Outcome: Progressing °  °Problem: RH SKIN INTEGRITY °Goal: RH STG SKIN FREE OF INFECTION/BREAKDOWN °Description: Manage with min assist °Outcome: Progressing °Goal: RH STG MAINTAIN SKIN INTEGRITY WITH ASSISTANCE °Description: STG Maintain Skin Integrity With Assistance. °Outcome: Progressing °  °Problem: RH SAFETY °Goal: RH STG ADHERE TO SAFETY PRECAUTIONS W/ASSISTANCE/DEVICE °Description: STG Adhere to Safety Precautions With  cues/reminders Assistance/Device. °Outcome: Progressing °Goal: RH STG DECREASED RISK OF FALL WITH ASSISTANCE °Description: STG Decreased Risk of Fall With  cues/reminders Assistance. °Outcome: Progressing °  °Problem: RH COGNITION-NURSING °Goal: RH STG USES MEMORY AIDS/STRATEGIES W/ASSIST TO PROBLEM SOLVE °Description: STG Uses Memory Aids/Strategies With  cues/reminders Assistance to Problem Solve. °Outcome: Progressing °Goal: RH STG ANTICIPATES NEEDS/CALLS FOR ASSIST W/ASSIST/CUES °Description: STG Anticipates Needs/Calls for Assist With cues/reminders  Assistance/Cues. °Outcome: Progressing °  °Problem: RH PAIN MANAGEMENT °Goal: RH STG PAIN MANAGED AT OR BELOW PT'S PAIN GOAL °Description: At or below level 4 °Outcome: Progressing °  °Problem: RH KNOWLEDGE DEFICIT °Goal: RH STG INCREASE KNOWLEDGE OF HYPERTENSION °Description: Patient's daughter will be able to manage HTN using medications and diet restrictions using handouts and educational information with cues/reminders °Outcome: Progressing °Goal: RH STG INCREASE KNOWLEDGE OF DYSPHAGIA/FLUID INTAKE °Description: Patient's daughter will be able to manage dysphagia  diet restrictions using handouts and educational information with cues/reminders °Outcome: Progressing °Goal: RH STG INCREASE KNOWLEDGE OF STROKE PROPHYLAXIS °Description: Patient's daughter will be able to manage secondary stroke prevention using medications and diet restrictions using handouts and educational information with cues/reminders °Outcome: Progressing °  °

## 2020-06-17 NOTE — Progress Notes (Signed)
[facility] PHYSICAL MEDICINE & REHABILITATION PROGRESS NOTE   Subjective/Complaints: Pt sitting in bed. Eating breakfast. Looks comfortable. Participating in therapy  ROS: limited due to language/communication    Objective:   No results found. Recent Labs    06/15/20 0648  WBC 5.0  HGB 12.1  HCT 38.5  PLT 200   Recent Labs    06/15/20 0648  NA 140  K 4.2  CL 108  CO2 24  GLUCOSE 106*  BUN 12  CREATININE 0.86  CALCIUM 8.5*    Intake/Output Summary (Last 24 hours) at 06/17/2020 1129 Last data filed at 06/17/2020 0810 Gross per 24 hour  Intake 340 ml  Output 0 ml  Net 340 ml     Physical Exam: Vital Signs Blood pressure (!) 184/72, pulse (!) 58, temperature 98.1 F (36.7 C), resp. rate 15, SpO2 97 %.  Constitutional: No distress . Vital signs reviewed. HEENT: EOMI, oral membranes moist Neck: supple Cardiovascular: RRR without murmur. No JVD    Respiratory/Chest: CTA Bilaterally without wheezes or rales. Normal effort    GI/Abdomen: BS +, non-tender, non-distended Ext: no clubbing, cyanosis, or edema Psych: pleasant and cooperative Musc: No edema in extremities.  No tenderness in extremities. Neuro: Alert Motor: RUE: Shoulder abduction 2/5, elbow flexion/extension 2 -/5, distally 2+/5, unchanged Right lower extremity: Hip flexion 2/5, Knee extension 2/5, distally 0/5, unchanged Moves left side without limitations  Assessment/Plan: 1. Functional deficits secondary to Left pontine infarct  which require 3+ hours per day of interdisciplinary therapy in a comprehensive inpatient rehab setting.  Physiatrist is providing close team supervision and 24 hour management of active medical problems listed below.  Physiatrist and rehab team continue to assess barriers to discharge/monitor patient progress toward functional and medical goals  Care Tool:  Bathing    Body parts bathed by patient: Abdomen, Chest, Right arm, Face, Right upper leg, Left upper leg, Front  perineal area   Body parts bathed by helper: Left arm, Left lower leg, Right lower leg Body parts n/a: Buttocks   Bathing assist Assist Level: Moderate Assistance - Patient 50 - 74%     Upper Body Dressing/Undressing Upper body dressing   What is the patient wearing?: Pull over shirt    Upper body assist Assist Level: Contact Guard/Touching assist    Lower Body Dressing/Undressing Lower body dressing      What is the patient wearing?: Incontinence brief, Skirt     Lower body assist Assist for lower body dressing: Total Assistance - Patient < 25%     Toileting Toileting    Toileting assist Assist for toileting: Total Assistance - Patient < 25%     Transfers Chair/bed transfer  Transfers assist     Chair/bed transfer assist level: Maximal Assistance - Patient 25 - 49%     Locomotion Ambulation   Ambulation assist   Ambulation activity did not occur: Safety/medical concerns (dense hemiplegia, decreased strength/activity tolerance)          Walk 10 feet activity   Assist  Walk 10 feet activity did not occur: Safety/medical concerns        Walk 50 feet activity   Assist Walk 50 feet with 2 turns activity did not occur: Safety/medical concerns         Walk 150 feet activity   Assist Walk 150 feet activity did not occur: Safety/medical concerns         Walk 10 feet on uneven surface  activity   Assist Walk 10 feet on  uneven surfaces activity did not occur: Safety/medical concerns         Wheelchair     Assist Will patient use wheelchair at discharge?: Yes (Per PT long term goals ) Type of Wheelchair: Manual Wheelchair activity did not occur: Safety/medical concerns (decreased strength/activity tolerance)         Wheelchair 50 feet with 2 turns activity    Assist    Wheelchair 50 feet with 2 turns activity did not occur: Safety/medical concerns       Wheelchair 150 feet activity     Assist  Wheelchair 150  feet activity did not occur: Safety/medical concerns   Assist Level: Dependent - Patient 0%   Blood pressure (!) 184/72, pulse (!) 58, temperature 98.1 F (36.7 C), resp. rate 15, SpO2 97 %.    Medical Problem List and Plan: 1.Right side hemiparesis and slurred speech with confusionsecondary to Acute left pontine infarction  Continue CIR  WHO/PRAFO qhs 2. Antithrombotics: -DVT/anticoagulation:Lovenox  CBC within normal limits on 7/16 -antiplatelet therapy: Aspirin 81 mg daily and Plavix 75 mg daily x3 weeks then aspirin alone 3. Pain Management:Tylenol as needed, has Musculoskeletal CP (work-up has been negative for cardiogenic source), sportscreme  Voltaren gel ordered to bilateral knees, ?efficacy  Lidoderm patch ordered on 7/15  7/18 appears comfortable 4. Mood:Provide emotional support -antipsychotic agents: N/A 5. Neuropsych: This patientisnot fully capable of making decisions on herown behalf. 6. Skin/Wound Care:Routine skin checks 7. Fluids/Electrolytes/Nutrition:Routine in and outs with follow-up chemistries 8. Dysphagia.   Advanced to D#3 thins  Encourage P.o. intake---intake borderline. May need help from family to encourage  Follow-up speech therapy 9. Hyperlipidemia. Lipitor 10.  Labile blood pressure  Trending up on 7/16  Lisinopril 2.5 started on 7/16-->increase to 5mg  today Vitals:   06/17/20 0416 06/17/20 0840  BP: (!) 154/92 (!) 184/72  Pulse: (!) 58   Resp: 15   Temp: 98.1 F (36.7 C)   SpO2: 97%   11.  AKI versus CKD stage II/III  GFR 58 on 7/16  Continue to encourage fluids  Continue to monitor----labs tomorrow 7/19  LOS: 15 days A FACE TO FACE EVALUATION WAS PERFORMED  8/19 06/17/2020, 11:29 AM

## 2020-06-18 ENCOUNTER — Inpatient Hospital Stay (HOSPITAL_COMMUNITY): Payer: Medicare Other

## 2020-06-18 ENCOUNTER — Inpatient Hospital Stay (HOSPITAL_COMMUNITY): Payer: Medicare Other | Admitting: Occupational Therapy

## 2020-06-18 DIAGNOSIS — R062 Wheezing: Secondary | ICD-10-CM

## 2020-06-18 LAB — BASIC METABOLIC PANEL
Anion gap: 10 (ref 5–15)
BUN: 11 mg/dL (ref 8–23)
CO2: 24 mmol/L (ref 22–32)
Calcium: 9 mg/dL (ref 8.9–10.3)
Chloride: 106 mmol/L (ref 98–111)
Creatinine, Ser: 0.83 mg/dL (ref 0.44–1.00)
GFR calc Af Amer: >60 mL/min (ref >60)
GFR calc non Af Amer: >60 mL/min (ref >60)
Glucose, Bld: 172 mg/dL — ABNORMAL HIGH (ref 70–99)
Potassium: 3.9 mmol/L (ref 3.5–5.1)
Sodium: 140 mmol/L (ref 135–145)

## 2020-06-18 MED ORDER — IPRATROPIUM-ALBUTEROL 0.5-2.5 (3) MG/3ML IN SOLN: 3.0000 mL | Freq: Four times a day (QID) | RESPIRATORY_TRACT | Status: DC | PRN

## 2020-06-18 MED ORDER — IPRATROPIUM-ALBUTEROL 0.5-2.5 (3) MG/3ML IN SOLN
3.0000 mL | Freq: Four times a day (QID) | RESPIRATORY_TRACT | Status: DC
  Administered 2020-06-18 (×2): 3 mL via RESPIRATORY_TRACT
  Filled 2020-06-18 (×2): qty 3

## 2020-06-18 NOTE — Progress Notes (Signed)
Occupational Therapy Session Note  Patient Details  Name: Ann Coleman MRN: [id number] Date of Birth: 21-Jun-1924  Today's Date: 06/18/2020 OT Individual Time: 1030-1130 OT Individual Time Calculation (min): 60 min    Short Term Goals: Week 2:  OT Short Term Goal 1 (Week 2): Pt will rise to stand to with min A to prep for LB dressing. OT Short Term Goal 2 (Week 2): Pt will don UB clothing with verbal cues. OT Short Term Goal 3 (Week 2): Pt will complete toilet transfers with mod A. OT Short Term Goal 4 (Week 2): Pt will don pants over feet with mod A.  Skilled Therapeutic Interventions/Progress Updates:    Pt received in bed with interpretor in the room.  Pt stated she needed to toilet.  To practice her transfers she will need to do at home, had pt complete squat pivot bed to wc and then to toilet with mod A.  Min to stand from toilet with total A for clothing management. Pt did urinate and have a BM, she was able to reach behind and cleanse herself. Total with adjustment of brief and mod to squat pivot back to wc. Pt washed hands at sink.  Taken to gym to focus on RUE NMR with use of skate board for shoulder and elbow, picking up and placing 1" pegs for finger strength and squeezing foam block for grasp.   Pt taken back to room and set up with belt alarm and tray table to prepare for lunch.   Therapy Documentation Precautions:  Precautions Precautions: Fall Precaution Comments: R hemiplegia Restrictions Weight Bearing Restrictions: No     Pain: Pain Assessment Pain Scale: 0-10 Pain Score: 0-No pain    Therapy/Group: Individual Therapy  Abbey Veith 06/18/2020, 12:41 PM

## 2020-06-18 NOTE — Progress Notes (Signed)
[facility] PHYSICAL MEDICINE & REHABILITATION PROGRESS NOTE   Subjective/Complaints: Patient seen sitting up in bed this morning eating breakfast.  She appears to have increased work of breathing.  She states she has this issue at baseline from time to time and just lays down when it happens.  Communicated via audio interpreter but some limitations.  ROS: + Shortness of breath. Denies CP, SOB, N/V/D  Objective:   No results found. No results for input(s): WBC, HGB, HCT, PLT in the last 72 hours. No results for input(s): NA, K, CL, CO2, GLUCOSE, BUN, CREATININE, CALCIUM in the last 72 hours.  Intake/Output Summary (Last 24 hours) at 06/18/2020 0849 Last data filed at 06/18/2020 0807 Gross per 24 hour  Intake 368 ml  Output 0 ml  Net 368 ml     Physical Exam: Vital Signs Blood pressure (!) 153/66, pulse 72, temperature 98.1 F (36.7 C), temperature source Oral, resp. rate 16, SpO2 94 %.  Constitutional: No distress . Vital signs reviewed. HENT: Normocephalic.  Atraumatic. Eyes: EOMI. No discharge. Cardiovascular: No JVD.  RRR. Respiratory: Increased work of breathing.  No stridor.  + Expiratory wheezes GI: Non-distended.  BS +. Skin: Warm and dry.  Intact. Psych: Normal mood.  Normal behavior. Musc: No edema in extremities.  No tenderness in extremities. Neuro: Alert Motor: RUE: Shoulder abduction 2/5, elbow flexion/extension 2 -/5, distally 2+/5, stable Right lower extremity: Hip flexion 2/5, Knee extension 2/5, distally 0/5, stable  Assessment/Plan: 1. Functional deficits secondary to Left pontine infarct  which require 3+ hours per day of interdisciplinary therapy in a comprehensive inpatient rehab setting.  Physiatrist is providing close team supervision and 24 hour management of active medical problems listed below.  Physiatrist and rehab team continue to assess barriers to discharge/monitor patient progress toward functional and medical goals  Care Tool:  Bathing     Body parts bathed by patient: Abdomen, Chest, Right arm, Face, Right upper leg, Left upper leg, Front perineal area   Body parts bathed by helper: Left arm, Left lower leg, Right lower leg Body parts n/a: Buttocks   Bathing assist Assist Level: Moderate Assistance - Patient 50 - 74%     Upper Body Dressing/Undressing Upper body dressing   What is the patient wearing?: Pull over shirt    Upper body assist Assist Level: Contact Guard/Touching assist    Lower Body Dressing/Undressing Lower body dressing      What is the patient wearing?: Incontinence brief, Skirt     Lower body assist Assist for lower body dressing: Total Assistance - Patient < 25%     Toileting Toileting    Toileting assist Assist for toileting: Total Assistance - Patient < 25%     Transfers Chair/bed transfer  Transfers assist     Chair/bed transfer assist level: Maximal Assistance - Patient 25 - 49%     Locomotion Ambulation   Ambulation assist   Ambulation activity did not occur: Safety/medical concerns (dense hemiplegia, decreased strength/activity tolerance)          Walk 10 feet activity   Assist  Walk 10 feet activity did not occur: Safety/medical concerns        Walk 50 feet activity   Assist Walk 50 feet with 2 turns activity did not occur: Safety/medical concerns         Walk 150 feet activity   Assist Walk 150 feet activity did not occur: Safety/medical concerns         Walk 10 feet on uneven  surface  activity   Assist Walk 10 feet on uneven surfaces activity did not occur: Safety/medical concerns         Wheelchair     Assist Will patient use wheelchair at discharge?: Yes (Per PT long term goals ) Type of Wheelchair: Manual Wheelchair activity did not occur: Safety/medical concerns (decreased strength/activity tolerance)         Wheelchair 50 feet with 2 turns activity    Assist    Wheelchair 50 feet with 2 turns activity did not  occur: Safety/medical concerns       Wheelchair 150 feet activity     Assist  Wheelchair 150 feet activity did not occur: Safety/medical concerns   Assist Level: Dependent - Patient 0%   Blood pressure (!) 153/66, pulse 72, temperature 98.1 F (36.7 C), temperature source Oral, resp. rate 16, SpO2 94 %.    Medical Problem List and Plan: 1.Right side hemiparesis and slurred speech with confusionsecondary to Acute left pontine infarction  Continue CIR  WHO/PRAFO qhs 2. Antithrombotics: -DVT/anticoagulation:Lovenox  CBC within normal limits on 7/16 -antiplatelet therapy: Aspirin 81 mg daily and Plavix 75 mg daily x3 weeks then aspirin alone 3. Pain Management:Tylenol as needed, has Musculoskeletal CP (work-up has been negative for cardiogenic source), sportscreme  Voltaren gel ordered to bilateral knees, ?efficacy  Lidoderm patch ordered on 7/15  Appears controlled on 7/19 4. Mood:Provide emotional support -antipsychotic agents: N/A 5. Neuropsych: This patientisnot fully capable of making decisions on herown behalf. 6. Skin/Wound Care:Routine skin checks 7. Fluids/Electrolytes/Nutrition:Routine in and outs with follow-up chemistries 8. Dysphagia.   Advanced to D#3 thin  Encourage P.o. intake  Follow-up speech therapy 9. Hyperlipidemia. Lipitor 10.  Labile blood pressure  Trending up on 7/16  Lisinopril increased to 5  Remains elevated, will consider further medication adjustments tomorrow if persistent Vitals:   06/17/20 1958 06/18/20 0502  BP: (!) 153/73 (!) 153/66  Pulse: 76 72  Resp: 18 16  Temp: 97.9 F (36.6 C) 98.1 F (36.7 C)  SpO2: 95% 94%  11.  AKI versus CKD stage II/III  GFR 58 on 7/16, labs pending  Continue to encourage fluids  Continue to monitor 12.  Wheezing  DuoNebs ordered  LOS: 16 days A FACE TO FACE EVALUATION WAS PERFORMED  Tiena Manansala Karis Juba 06/18/2020, 8:49 AM

## 2020-06-18 NOTE — Progress Notes (Signed)
Physical Therapy Session Note  Patient Details  Name: Ann Coleman MRN: [id number] Date of Birth: Dec 14, 1923  Today's Date: 06/18/2020 PT Individual Time: 0900-1000 PT Individual Time Calculation (min): 60 min   Short Term Goals: Week 1:  PT Short Term Goal 1 (Week 1): Patient to be able to perform all bed mobility with modAx1 and use of bed features PT Short Term Goal 1 - Progress (Week 1): Progressing toward goal PT Short Term Goal 2 (Week 1): Patient to be able to complete sliding board transfers with MaxAx1 and standby of second person for safety PT Short Term Goal 2 - Progress (Week 1): Progressing toward goal PT Short Term Goal 3 (Week 1): Patient to tolerate standing for 3 minutes in standing frame PT Short Term Goal 3 - Progress (Week 1): Met PT Short Term Goal 4 (Week 1): Patient to initiate gait training PT Short Term Goal 4 - Progress (Week 1): Progressing toward goal Week 2:  PT Short Term Goal 1 (Week 2): Pt will perform supine<>sit with max assist of 1 person PT Short Term Goal 2 (Week 2): Pt will perform bed<>chair transfers with max assist of 1 person (+2 can be present for safety) PT Short Term Goal 3 (Week 2): Pt will perform sit<>stands with +2 mod assist PT Short Term Goal 4 (Week 2): Pt will initiate gait training using harness support as needed Week 3:     Skilled Therapeutic Interventions/Progress Updates:    PAIN denies pain this am Pt awake and alert.  Interpreter utilized for session.  Pt agreeable to treatment.  Pt lifts L foot to thread skirt, mod assist to lift R.   Dependent assist to raise skirt, rolls L and R w/cues to utilize RUE and RLE, mod assist Supine to L side to sit w/mod assist and cues Static sit on edge of bed w/cga Bed to wc squat pvt max assist of 1 to R side, max cues and manual facilitation of ant /then lat wt shift w/transition, second person to secure wc Transported to gym wc to mat to R wmax assist/stool under feet to faciilitate use of LEs,  cues for ant wt shift/lateral wt shift/attempts to scoot by dragging across in upright vs unweighting maintains sitting balance w/transition w/cga.  Once sitting on edge of mat, cga to supervision for balance w/feet supported on stool.  Balance and gross motor skills: Reaching across midline to grab horseshoes placed on mat to opposite side then back across midline to place on horseshoe peg w/cga for balance using LUE, occasional min assist to grasp when using RUE but able to lift and place on peg 90% time without assist, 25% requires mult efforts.  rpeeated x 10 each side.     Fine motor challenge RUE hand over hand assist w/peg board x 8 pegs, cga  Pt Incontinent of bladder, sensed urgency but unable to hold urine.  Squat pvt mat to wc to L side w/mod assist, cues and assist as above. Transported to room.  Squat pivot wc to bed mod assist.  Sit to supine w/mod to max assist of 1.  Pt rolled mult times L/R w/cues to use RUE/RLE to assist, mod assist, uses rails.  Brief changed and pt dons clean skirt as above.  Pt left supine w/rails up x 4, alarm set, bed in lowest position, and needs in reach.    Therapy Documentation Precautions:  Precautions Precautions: Fall Precaution Comments: R hemiplegia Restrictions Weight Bearing Restrictions: No    Therapy/Group:  Individual Therapy  Callie Fielding, [place] 06/18/2020, 12:54 PM

## 2020-06-18 NOTE — Progress Notes (Signed)
Patient ID: Ann Coleman, female   DOB: 11-02-1924, 86 y.o.   MRN: [id number]   Shower bench ordered through [employer]

## 2020-06-18 NOTE — Plan of Care (Signed)
  Problem: RH COGNITION-NURSING Goal: RH STG USES MEMORY AIDS/STRATEGIES W/ASSIST TO PROBLEM SOLVE Description: STG Uses Memory Aids/Strategies With  cues/reminders Assistance to Problem Solve. Outcome: Progressing Goal: RH STG ANTICIPATES NEEDS/CALLS FOR ASSIST W/ASSIST/CUES Description: STG Anticipates Needs/Calls for Assist With cues/reminders Assistance/Cues. Outcome: Progressing   Problem: RH SAFETY Goal: RH STG ADHERE TO SAFETY PRECAUTIONS W/ASSISTANCE/DEVICE Description: STG Adhere to Safety Precautions With  cues/reminders Assistance/Device. Outcome: Progressing Goal: RH STG DECREASED RISK OF FALL WITH ASSISTANCE Description: STG Decreased Risk of Fall With  cues/reminders Assistance. Outcome: Progressing

## 2020-06-18 NOTE — Progress Notes (Signed)
Occupational Therapy Session Note  Patient Details  Name: Ann Coleman MRN: [id number] Date of Birth: 1924/03/30  Today's Date: 06/18/2020 OT Individual Time: 1301-1415 OT Individual Time Calculation (min): 74 min    Short Term Goals: Week 2:  OT Short Term Goal 1 (Week 2): Pt will rise to stand to with min A to prep for LB dressing. OT Short Term Goal 2 (Week 2): Pt will don UB clothing with verbal cues. OT Short Term Goal 3 (Week 2): Pt will complete toilet transfers with mod A. OT Short Term Goal 4 (Week 2): Pt will don pants over feet with mod A.  Skilled Therapeutic Interventions/Progress Updates:    Pt sitting up in w/c with dtr and interpretor present.  Pt reports she has not been in shower since Friday, dtr requesting her to bathe.  Discussed bathroom setup with dtr, who reports getting her in shower is a priority for dc.  Pt completed squat pivot transfer w/c to shower bench with max assist.  Foot stool placed under BLE for support during bathing.  Pt bathed UB and LB with mod assist.  Pt completed UB dressing with min assist and VCs to facilitate hemitechnique.  Pt dressed LB with total assist in sitting and standing using grab bar for balance. Instructed pt to scoot to edge of shower bench to simulate tub bench transfer. Pt completed SPT shower bench to w/c using grab bar for balance needing mod assist.    Educated pts dtr regarding pts need for grab bars in shower to assist with safe bathing. Training during functional transfer completed with pts dtr w/c to EOB needing max VCs.  Educated regarding body mechanics for pt and pts dtr during sit to supine.    Pt participated in neuro re-ed of right biceps, triceps, and wrist extensors.  Pt needing max VCs to facilitate visual feedback and increased muscle activation.  Completed 2 x 10 using 1 lb free weight biceps curls, triceps press and wrist ext (no weight). Bed alarm on, call bell in reach.  Pts dtr requires further training on functional  transfers.    Therapy Documentation Precautions:  Precautions Precautions: Fall Precaution Comments: R hemiplegia Restrictions Weight Bearing Restrictions: No   Therapy/Group: Individual Therapy  Amie Critchley 06/18/2020, 3:45 PM

## 2020-06-19 ENCOUNTER — Inpatient Hospital Stay (HOSPITAL_COMMUNITY): Payer: Medicare Other | Admitting: Occupational Therapy

## 2020-06-19 ENCOUNTER — Inpatient Hospital Stay (HOSPITAL_COMMUNITY): Payer: Medicare Other

## 2020-06-19 DIAGNOSIS — I5189 Other ill-defined heart diseases: Secondary | ICD-10-CM

## 2020-06-19 DIAGNOSIS — R0600 Dyspnea, unspecified: Secondary | ICD-10-CM

## 2020-06-19 DIAGNOSIS — R7303 Prediabetes: Secondary | ICD-10-CM

## 2020-06-19 MED ORDER — LISINOPRIL 5 MG PO TABS
5.0000 mg | ORAL_TABLET | Freq: Once | ORAL | Status: AC
  Administered 2020-06-19: 5 mg via ORAL
  Filled 2020-06-19: qty 1

## 2020-06-19 MED ORDER — LISINOPRIL 10 MG PO TABS
10.0000 mg | ORAL_TABLET | Freq: Every day | ORAL | Status: DC
  Administered 2020-06-20 – 2020-06-22 (×3): 10 mg via ORAL
  Filled 2020-06-19 (×3): qty 1

## 2020-06-19 MED ORDER — IPRATROPIUM-ALBUTEROL 0.5-2.5 (3) MG/3ML IN SOLN: 3.0000 mL | RESPIRATORY_TRACT | Status: DC | PRN

## 2020-06-19 NOTE — Progress Notes (Signed)
Patient ID: Ann Coleman, female   DOB: 1924/08/08, 86 y.o.   MRN: [id number]   Family education schedule for Thursday 1-3 with daughter and son.

## 2020-06-19 NOTE — Progress Notes (Signed)
Occupational Therapy Session Note  Patient Details  Name: Tiearra Lahti MRN: [id number] Date of Birth: 02-16-1924  Today's Date: 06/19/2020 OT Individual Time: 1030-1130 OT Individual Time Calculation (min): 60 min    Short Term Goals: Week 1:  OT Short Term Goal 1 (Week 1): Pt will don UB clothing with mod A OT Short Term Goal 1 - Progress (Week 1): Met OT Short Term Goal 2 (Week 1): Pt will sit EOB for ADL with no more than mod A for sitting balance OT Short Term Goal 2 - Progress (Week 1): Met OT Short Term Goal 3 (Week 1): Pt will demonstrate improved R attention with no more than mod cueing for scanning during ADL task OT Short Term Goal 3 - Progress (Week 1): Met OT Short Term Goal 4 (Week 1): Pt and caregiver will correctly position her RUE at rest with min A OT Short Term Goal 4 - Progress (Week 1): Progressing toward goal Week 2:  OT Short Term Goal 1 (Week 2): Pt will rise to stand to with min A to prep for LB dressing. OT Short Term Goal 2 (Week 2): Pt will don UB clothing with verbal cues. OT Short Term Goal 3 (Week 2): Pt will complete toilet transfers with mod A. OT Short Term Goal 4 (Week 2): Pt will don pants over feet with mod A.     Skilled Therapeutic Interventions/Progress Updates:    Pt received in w/c ready for therapy. She had just completed toileting.  Pt taken to tub room to practice tub transfers.  Initially tried a squat pivot to her R side but she was not keeping her weight shifted forward to be able to clear bench seat and it was becoming unsafe. Attempted with a slide board, but the board tilted upward and it was extremely difficult for pt to move along the board.   Pt then taken to gym to focus on trying a slide board to mat, it was still uphill and just too difficult for pt so had her do a stand pivot with R knee blocked and it actually worked well.    Pt worked on RUE a/arom for shoulder and elbow while grasping for cones. Improved grasp strength. +2 A available  so worked on sit to stand from mat with 1 inch step under feet. With support to R knee, pt able to rise to stand with CGA and had her stand for 3 seconds, then sit and grab 3 cones. Repeated the 3 and 3 exercise 8 x.  Pt then completed stand pivot back to w.c with min A.  Pt often c/o R knee pain from arthritis but encouraged her to focus on completing transfer safely as she only has the pain when standing for a few seconds.  Pt taken back to tub room to practice tub transfers again with stand pivot. Min to pivot to tub seat and mod -max to adjust legs in and get pt adjusted onto tub bench. She needs to use reciprocal scoots to get all the way back on bench.  Transferred back to wc and taken back to room with all needs met. Belt alarm on.   Therapy Documentation Precautions:  Precautions Precautions: Fall Precaution Comments: R hemiplegia Restrictions Weight Bearing Restrictions: No     Pain: Pain Assessment Faces Pain Scale: Hurts little more Pain Type: Chronic pain Pain Location: Knee Pain Orientation: Right;Left Pain Descriptors / Indicators: Aching Pain Onset: With Activity Pain Intervention(s): Rest   Therapy/Group: Individual Therapy  [place] 06/19/2020, 12:21 PM

## 2020-06-19 NOTE — Progress Notes (Signed)
Patient ID: Ann Coleman, female   DOB: 1924/05/05, 86 y.o.   MRN: [id number]   Patient set up with [facility] and [facility] for PCP at discharge. Nurses will call family to schedule appointment.

## 2020-06-19 NOTE — Progress Notes (Signed)
Patient ID: Ann Coleman, female   DOB: [birth year], 86 y.o.   MRN: [id number]   SW left daughter a voicemail about scheduling family education for Wednesdays and Thursday.

## 2020-06-19 NOTE — Plan of Care (Signed)
  Problem: RH Balance Goal: LTG Patient will maintain dynamic sitting balance (PT) Description: LTG:  Patient will maintain dynamic sitting balance with assistance during mobility activities (PT) Outcome: Not Applicable Goal: LTG Patient will maintain dynamic standing balance (PT) Description: LTG:  Patient will maintain dynamic standing balance with assistance during mobility activities (PT) Outcome: Not Applicable Flowsheets (Taken 06/19/2020 0836) LTG: Pt will maintain dynamic standing balance during mobility activities with:: (dc) -- Note: Standing not tolerated due to knee pain   Problem: Sit to Stand Goal: LTG:  Patient will perform sit to stand with assistance level (PT) Description: LTG:  Patient will perform sit to stand with assistance level (PT) Outcome: Not Applicable Flowsheets (Taken 06/19/2020 0836) LTG: PT will perform sit to stand in preparation for functional mobility with assistance level: (standing not functional dc) -- Note: Standing not functional      Problem: RH Bed to Chair Transfers Goal: LTG Patient will perform bed/chair transfers w/assist (PT) Description: LTG: Patient will perform bed to chair transfers with assistance (PT). Flowsheets (Taken 06/19/2020 0836) LTG: Pt will perform Bed to Chair Transfers with assistance level: (downgraded) Moderate Assistance - Patient 50 - 74% Note: Limited progress   Problem: RH Car Transfers Goal: LTG Patient will perform car transfers with assist (PT) Description: LTG: Patient will perform car transfers with assistance (PT). Flowsheets (Taken 06/19/2020 0836) LTG: Pt will perform car transfers with assist:: (downgraded) Moderate Assistance - Patient 50 - 74% Note: Limited progress   Problem: RH Ambulation Goal: LTG Patient will ambulate in controlled environment (PT) Description: LTG: Patient will ambulate in a controlled environment, # of feet with assistance (PT). Outcome: Not Applicable Flowsheets (Taken 06/19/2020  0836) LTG: Pt will ambulate in controlled environ  assist needed:: (dc) -- Note: Limited progress   Problem: RH Wheelchair Mobility Goal: LTG Patient will propel w/c in controlled environment (PT) Description: LTG: Patient will propel wheelchair in controlled environment, # of feet with assist (PT) Outcome: Not Applicable Flowsheets (Taken 06/19/2020 0836) LTG: Pt will propel w/c in controlled environ  assist needed:: (dc) -- Note: Not functional Goal: LTG Patient will propel w/c in home environment (PT) Description: LTG: Patient will propel wheelchair in home environment, # of feet with assistance (PT). Outcome: Not Applicable Flowsheets (Taken 06/19/2020 0836) LTG: Pt will propel w/c in home environ  assist needed:: (dc) -- Note: Not functional   Problem: RH Stairs Goal: LTG Patient will ambulate up and down stairs w/assist (PT) Description: LTG: Patient will ambulate up and down # of stairs with assistance (PT) Outcome: Not Applicable Flowsheets (Taken 06/19/2020 0836) LTG: Pt will ambulate up/down stairs assist needed:: (dc) -- Note: Not applicable

## 2020-06-19 NOTE — Plan of Care (Signed)
  Problem: RH SAFETY °Goal: RH STG ADHERE TO SAFETY PRECAUTIONS W/ASSISTANCE/DEVICE °Description: STG Adhere to Safety Precautions With  cues/reminders Assistance/Device. °Outcome: Progressing °Goal: RH STG DECREASED RISK OF FALL WITH ASSISTANCE °Description: STG Decreased Risk of Fall With  cues/reminders Assistance. °Outcome: Progressing °  °

## 2020-06-19 NOTE — Progress Notes (Addendum)
Occupational Therapy Weekly Progress Note  Patient Details  Name: Ann Coleman MRN: [id number] Date of Birth: 15-Dec-1923  Beginning of progress report period: June 11, 2020 End of progress report period: June 19, 2020  Today's Date: 06/19/2020 OT Individual Time: 1301-1405 OT Individual Time Calculation (min): 64 min    Patient has met 1 of 4 short term goals.  Pt progressing slowly with main barriers being pain in knees while standing, impaired attention, and decreased activity tolerance.  Pt does have increased AROM of RUE however limited functional hand use noted during self care and functional transfers.  Pt has family that is able to assist with self care and transfers as needed, therefore focus of skilled interventions has been directed towards caregiver education to ensure safe dc to home setting.  Pt will require shower bench to facilitate safe bathing due to poor standing balance and tolerance.    Patient continues to demonstrate the following deficits: muscle weakness, decreased cardiorespiratoy endurance, ataxia, decreased coordination and decreased motor planning, decreased attention, decreased awareness, decreased problem solving, decreased safety awareness, decreased memory and delayed processing and decreased sitting balance, decreased standing balance and decreased balance strategies and therefore will continue to benefit from skilled OT intervention to enhance overall performance with BADL.  Patient not progressing toward long term goals.  See goal revision..  Continue plan of care.  OT Short Term Goals Week 2:  OT Short Term Goal 1 (Week 2): Pt will rise to stand to with min A to prep for LB dressing. Progressing. OT Short Term Goal 2 (Week 2): Pt will don UB clothing with verbal cues. Progressing. OT Short Term Goal 3 (Week 2): Pt will complete toilet transfers with mod A. Met. OT Short Term Goal 4 (Week 2): Pt will don pants over feet with mod A. Not progressing. Week 3:    STGs=LTGs  Skilled Therapeutic Interventions/Progress Updates:    Pt sitting up in w/c eating lunch upon OT arrival.  Pts dtr provided picture of bathroom layout to aide in correct simulation and recommendations.  OT requesting pts dtr to measure width of doorways and width of bathroom to ensure w/c accessibility.  Educated pts dtr on toileting transfers with 3 in 1 commode (due to pts daughter reports she cant install grab bars) and shower bench transfers with tub shower.  Pts dtr assisted pt with squat pivot transfers with supervision to CGA intermittently.  Pts dtr needing VCs to facilitate improved body mechanics to facilitate pt forward leaning.  Pt requesting back to bed at end of session.  Pts dtr assisted in squat pivot transfer w/c to EOB and sit to supine.  Pt reporting to fatigued to work on RUE neuro re-ed today, therefore discontinued session.  Call bell in reach and bed alarm on.  Therapy Documentation Precautions:  Precautions Precautions: Fall Precaution Comments: R hemiplegia Restrictions Weight Bearing Restrictions: No   Therapy/Group: Individual Therapy  Ezekiel Slocumb 06/19/2020, 2:09 PM

## 2020-06-19 NOTE — Progress Notes (Signed)
Physical Therapy Weekly Progress Note  Patient Details  Name: Ann Coleman MRN: [id number] Date of Birth: Apr 30, 1924  Beginning of progress report period: June 11, 2020 End of progress report period: June 19, 2020  Today's Date: 06/19/2020 PT Individual Time: 0845-1000 PT Individual Time Calculation (min): 75 min   Patient has met 2 of 3 short term goals.  Gait goal has been discontinued.  Patient continues to demonstrate the following deficits muscle weakness, decreased cardiorespiratoy endurance, impaired timing and sequencing, abnormal tone, unbalanced muscle activation and decreased coordination, impaired attention, decreased attention and decreased awareness and decreased standing balance, decreased postural control, hemiplegia and decreased balance strategies and therefore will continue to benefit from skilled PT intervention to increase functional independence with mobility.  Patient not progressing toward long term goals.  See goal revision..  Plan of care revisions: standing, gait, and wc skills have been discontinued.  focus on family training and equipment ordering/seating/positioning .  PT Short Term Goals Week 1:  PT Short Term Goal 1 (Week 1): Patient to be able to perform all bed mobility with modAx1 and use of bed features PT Short Term Goal 1 - Progress (Week 1): Progressing toward goal PT Short Term Goal 2 (Week 1): Patient to be able to complete sliding board transfers with MaxAx1 and standby of second person for safety PT Short Term Goal 2 - Progress (Week 1): Progressing toward goal PT Short Term Goal 3 (Week 1): Patient to tolerate standing for 3 minutes in standing frame PT Short Term Goal 3 - Progress (Week 1): Met PT Short Term Goal 4 (Week 1): Patient to initiate gait training PT Short Term Goal 4 - Progress (Week 1): Progressing toward goal Week 2:  PT Short Term Goal 1 (Week 2): Pt will perform supine<>sit with max assist of 1 person PT Short Term Goal 2 (Week 2): Pt  will perform bed<>chair transfers with max assist of 1 person (+2 can be present for safety) PT Short Term Goal 3 (Week 2): Pt will perform sit<>stands with +2 mod assist PT Short Term Goal 4 (Week 2): Pt will initiate gait training using harness support as needed  Skilled Therapeutic Interventions/Progress Updates:     Pt initially sleeping and slow to waken for therapy.  Interpreter present for full session.  Supine to side to sit w/mod assist and cues for sequencing.  Scooting in sitting w/mod assist to fully advance R hip. Discussed goals of todays session including wc perscription and intitiation of car transfer training. Pt instructed w/ lateral scoot SBT wc to/from bed to prepare for car transfer. Pt performs bed to wc SBT w/set up, mod assist, max cues for sequencing/wtshifting/hand placement. Pt transported to gym for continued session.  Car occupied w/ongoing family training session, so practiced simulated car transfer using mat.  Mat to wc as described above.  Pt then measured for wc w/the following recs relayed to social worker: 16/16 w/17in STF ht. Will continue w/car transfer training to determine if SBT vs SPT best option.   Also requested family traiing arranged for next two days w/caregivers. Pt transported to main gym.  STS in parallel bars repeated x 5 w/min assist for transition, use of mirror for feedback, cues for upright posture/stands w/increased trunk flexion, distracted by activity in gym, stands 5-15 sec before returning to sitting w/min assist, mod assist to reposition in wc. At end of session, pt transported to room and left oob in wc w/alarm belt set and all needs in reach.  Therapy Documentation Precautions:  Precautions Precautions: Fall Precaution Comments: R hemiplegia Restrictions Weight Bearing Restrictions: No   Therapy/Group: Individual Therapy  Callie Fielding, [place] 06/19/2020, 12:52 PM

## 2020-06-19 NOTE — Progress Notes (Signed)
Patient ID: Ann Coleman, female   DOB: 1924/10/24, 86 y.o.   MRN: [id number]   Hospital Bed and Wheelchair ordered though [employer]

## 2020-06-19 NOTE — Progress Notes (Signed)
PHYSICAL MEDICINE & REHABILITATION PROGRESS NOTE   Subjective/Complaints: Patient seen sitting up in bed this morning, eating breakfast.  Audio interpreter utilized.  She states she slept well overnight.  She notes pain "all over".  ROS: + Shortness of breath when laying down, pain "all over". Denies CP, SOB, N/V/D  Objective:   No results found. No results for input(s): WBC, HGB, HCT, PLT in the last 72 hours. Recent Labs    06/18/20 1243  NA 140  K 3.9  CL 106  CO2 24  GLUCOSE 172*  BUN 11  CREATININE 0.83  CALCIUM 9.0    Intake/Output Summary (Last 24 hours) at 06/19/2020 1038 Last data filed at 06/19/2020 0839 Gross per 24 hour  Intake 20 ml  Output --  Net 20 ml     Physical Exam: Vital Signs Blood pressure (!) 162/69, pulse 61, temperature 97.8 F (36.6 C), temperature source Oral, resp. rate 19, SpO2 95 %.  Constitutional: No distress . Vital signs reviewed. HENT: Normocephalic.  Atraumatic. Eyes: EOMI. No discharge. Cardiovascular: No JVD.  RRR. Respiratory: Normal effort.  No stridor.  Expiratory wheezes GI: Non-distended.  BS +. Skin: Warm and dry.  Intact. Psych: Normal mood.  Normal behavior. Musc: No edema in extremities.  No obvious tenderness in extremities. Neuro: Alert Motor: RUE: Shoulder abduction 2/5, elbow flexion/extension 2 -/5, distally 2+/5, unchanged Right lower extremity: Hip flexion 2/5, Knee extension 2/5, distally 0/5, unchanged  Assessment/Plan: 1. Functional deficits secondary to Left pontine infarct  which require 3+ hours per day of interdisciplinary therapy in a comprehensive inpatient rehab setting.  Physiatrist is providing close team supervision and 24 hour management of active medical problems listed below.  Physiatrist and rehab team continue to assess barriers to discharge/monitor patient progress toward functional and medical goals  Care Tool:  Bathing    Body parts bathed by patient: Abdomen, Chest,  Right arm, Face, Right upper leg, Left upper leg, Front perineal area   Body parts bathed by helper: Left arm, Left lower leg, Right lower leg Body parts n/a: Buttocks   Bathing assist Assist Level: Moderate Assistance - Patient 50 - 74%     Upper Body Dressing/Undressing Upper body dressing   What is the patient wearing?: Pull over shirt    Upper body assist Assist Level: Minimal Assistance - Patient > 75%    Lower Body Dressing/Undressing Lower body dressing      What is the patient wearing?: Incontinence brief, Skirt     Lower body assist Assist for lower body dressing: Total Assistance - Patient < 25%     Toileting Toileting    Toileting assist Assist for toileting: Maximal Assistance - Patient 25 - 49%     Transfers Chair/bed transfer  Transfers assist     Chair/bed transfer assist level: Moderate Assistance - Patient 50 - 74%     Locomotion Ambulation   Ambulation assist   Ambulation activity did not occur: Safety/medical concerns (dense hemiplegia, decreased strength/activity tolerance)          Walk 10 feet activity   Assist  Walk 10 feet activity did not occur: Safety/medical concerns        Walk 50 feet activity   Assist Walk 50 feet with 2 turns activity did not occur: Safety/medical concerns         Walk 150 feet activity   Assist Walk 150 feet activity did not occur: Safety/medical concerns         Walk 10  feet on uneven surface  activity   Assist Walk 10 feet on uneven surfaces activity did not occur: Safety/medical concerns         Wheelchair     Assist Will patient use wheelchair at discharge?: Yes (Per PT long term goals ) Type of Wheelchair: Manual Wheelchair activity did not occur: Safety/medical concerns (decreased strength/activity tolerance)         Wheelchair 50 feet with 2 turns activity    Assist    Wheelchair 50 feet with 2 turns activity did not occur: Safety/medical concerns        Wheelchair 150 feet activity     Assist  Wheelchair 150 feet activity did not occur: Safety/medical concerns   Assist Level: Dependent - Patient 0%   Blood pressure (!) 162/69, pulse 61, temperature 97.8 F (36.6 C), temperature source Oral, resp. rate 19, SpO2 95 %.    Medical Problem List and Plan: 1.Right side hemiparesis and slurred speech with confusionsecondary to Acute left pontine infarction  Continue CIR  WHO/PRAFO qhs 2. Antithrombotics: -DVT/anticoagulation:Lovenox  CBC within normal limits on 7/16 -antiplatelet therapy: Aspirin 81 mg daily and Plavix 75 mg daily x3 weeks then aspirin alone 3. Pain Management:Tylenol as needed, has Musculoskeletal CP (work-up has been negative for cardiogenic source), sportscreme  Voltaren gel ordered to bilateral knees, ?efficacy  Lidoderm patch ordered on 7/15  Appears controlled on 7/19 4. Mood:Provide emotional support -antipsychotic agents: N/A 5. Neuropsych: This patientisnot fully capable of making decisions on herown behalf. 6. Skin/Wound Care:Routine skin checks 7. Fluids/Electrolytes/Nutrition:Routine in and outs with follow-up chemistries 8. Dysphagia.   Advanced to D#3 thin  Encourage P.o. intake  Follow-up speech therapy 9. Hyperlipidemia. Lipitor 10.  Labile blood pressure  Trending up on 7/16  Lisinopril increased to 5, increased to 10 on 7/20  Remains elevated, will consider further medication adjustments tomorrow if persistent Vitals:   06/18/20 1931 06/19/20 0428  BP: (!) 148/64 (!) 162/69  Pulse: 78 61  Resp: 18 19  Temp: 97.6 F (36.4 C) 97.8 F (36.6 C)  SpO2: 97% 95%  11.  CKD stage II  GFR > 60 on 7/19  Continue to encourage fluids  Continue to monitor 12.  Wheezing  DuoNebs ? D/ced   CXR ordered 13.  Prediabetes  Blood glucose elevated on 7/19  Will plan to transition to carb modified diet once on regular textures 14.  Grade 1 diastolic  dysfunction  Echo reviewed from 05/2020  Will consider diuretics  LOS: 17 days A FACE TO FACE EVALUATION WAS PERFORMED  Auther Lyerly Karis Juba 06/19/2020, 10:38 AM

## 2020-06-20 ENCOUNTER — Inpatient Hospital Stay (HOSPITAL_COMMUNITY): Payer: Medicare Other | Admitting: Physical Therapy

## 2020-06-20 ENCOUNTER — Inpatient Hospital Stay (HOSPITAL_COMMUNITY): Payer: Medicare Other | Admitting: Occupational Therapy

## 2020-06-20 DIAGNOSIS — R0601 Orthopnea: Secondary | ICD-10-CM

## 2020-06-20 MED ORDER — IPRATROPIUM-ALBUTEROL 0.5-2.5 (3) MG/3ML IN SOLN
3.0000 mL | Freq: Three times a day (TID) | RESPIRATORY_TRACT | Status: DC
  Administered 2020-06-20 – 2020-06-21 (×3): 3 mL via RESPIRATORY_TRACT
  Filled 2020-06-20 (×3): qty 3

## 2020-06-20 MED ORDER — IPRATROPIUM-ALBUTEROL 0.5-2.5 (3) MG/3ML IN SOLN: 3.0000 mL | RESPIRATORY_TRACT | Status: DC

## 2020-06-20 MED ORDER — FUROSEMIDE 40 MG PO TABS
40.0000 mg | ORAL_TABLET | Freq: Once | ORAL | Status: AC
  Administered 2020-06-20: 40 mg via ORAL
  Filled 2020-06-20: qty 1

## 2020-06-20 NOTE — Progress Notes (Signed)
Physical Therapy Session Note  Patient Details  Name: Ann Coleman MRN: [id number] Date of Birth: 04/05/1924  Today's Date: 06/20/2020 PT Individual Time: 1035-1130 PT Individual Time Calculation (min): 55 min   Short Term Goals: Week 2:  PT Short Term Goal 1 (Week 2): Pt will perform supine<>sit with max assist of 1 person PT Short Term Goal 2 (Week 2): Pt will perform bed<>chair transfers with max assist of 1 person (+2 can be present for safety) PT Short Term Goal 3 (Week 2): Pt will perform sit<>stands with +2 mod assist PT Short Term Goal 4 (Week 2): Pt will initiate gait training using harness support as needed  Skilled Therapeutic Interventions/Progress Updates: Pt presented in w/c with interpreter present agreeable to therapy. Pt states pain in knees (lidocaine patches applied and rest breaks taken as needed). Pt transported to ortho gym to perform block practice car transfers in simulator. Pt refusing trial of SB transfer despite education on trying different methods to see which is safest for her and family. Agreeable to attempt stand pivot with pt holding onto grab bar that would be inside car. Pt was able to scoot to edge of w/c with minA and verbal cues, pt then performed stand pivot holding grab bar with minA to car. With cues pt was able to scoot back in seat and required modA for RLE placement into simulator. Pt required same assistance to get legs out of simulator and required modA for stand pivot to transfer back into w/c. Practiced same method again requiring minA for transfer to vehicle and closer to [facility] for transfer back to w/c with improved technique. Pt was able to wt shift LLE with PTA blocking R knee for better alignment into chair with second trial. Pt then transported to rehab gym and participated in STS in parallel bars with PTA blocking R knee and pt able to tolerate standing to counts of 10 with mirror feedback and multimodal cues for erect posture. Pt then transported back to  room and performed squat pivot with modA to return to bed. Pt was able to scoot posteirorly in bed before laying down with CGA and verbal cues. Pt required minA sit to supine and was able to perform lateral scoots to middle of bed. Pt repositioned to comfort and left with bed alarm on, call bell within reach and needs met.      Therapy Documentation Precautions:  Precautions Precautions: Fall Precaution Comments: R hemiplegia Restrictions Weight Bearing Restrictions: No General:   Vital Signs: Therapy Vitals Temp: 98.4 F (36.9 C) Temp Source: Oral Pulse Rate: 80 Resp: 16 BP: (!) 156/85 Patient Position (if appropriate): Lying Oxygen Therapy SpO2: 94 % O2 Device: Room Air   Therapy/Group: Individual Therapy  Margo Lama  Linzie Criss, PTA  06/20/2020, 4:43 PM

## 2020-06-20 NOTE — Plan of Care (Signed)
°  Problem: RH Dressing Goal: LTG Patient will perform lower body dressing w/assist (OT) Description: LTG: Patient will perform lower body dressing with assist, with/without cues in positioning using equipment (OT) Flowsheets (Taken 06/20/2020 1042) LTG: Pt will perform lower body dressing with assistance level of: (downgraded due to pt's stature, body habitus and balance) Dependent - Patient equals 0% Note: downgraded due to pt's stature, body habitus and balance    Problem: RH Toileting Goal: LTG Patient will perform toileting task (3/3 steps) with assistance level (OT) Description: LTG: Patient will perform toileting task (3/3 steps) with assistance level (OT)  Flowsheets (Taken 06/20/2020 1042) LTG: Pt will perform toileting task (3/3 steps) with assistance level: (downgraded due to pt's stature, body habitus and balance) Maximal Assistance - Patient 25 - 49% Note: downgraded due to pt's stature, body habitus and balance    Problem: RH Balance Goal: LTG Patient will maintain dynamic standing with ADLs (OT) Description: LTG:  Patient will maintain dynamic standing balance with assist during activities of daily living (OT)  Flowsheets (Taken 06/20/2020 1042) LTG: Pt will maintain dynamic standing balance during ADLs with: Moderate Assistance - Patient 50 - 74%   Problem: Sit to Stand Goal: LTG:  Patient will perform sit to stand in prep for activites of daily living with assistance level (OT) Description: LTG:  Patient will perform sit to stand in prep for activites of daily living with assistance level (OT) Flowsheets (Taken 06/20/2020 1042) LTG: PT will perform sit to stand in prep for activites of daily living with assistance level: Minimal Assistance - Patient > 75%

## 2020-06-20 NOTE — Plan of Care (Signed)
  Problem: RH SAFETY °Goal: RH STG ADHERE TO SAFETY PRECAUTIONS W/ASSISTANCE/DEVICE °Description: STG Adhere to Safety Precautions With  cues/reminders Assistance/Device. °Outcome: Progressing °Goal: RH STG DECREASED RISK OF FALL WITH ASSISTANCE °Description: STG Decreased Risk of Fall With  cues/reminders Assistance. °Outcome: Progressing °  °

## 2020-06-20 NOTE — Discharge Summary (Addendum)
Physician Discharge Summary  Patient ID: Ann Coleman MRN: [id number] DOB/AGE: Jul 30, 1924 86 y.o.  Admit date: 06/02/2020 Discharge date: 06/22/2020  Discharge Diagnoses:  Principal Problem:   Brainstem infarct, acute [facility]) Active Problems:   Left pontine cerebrovascular accident (HCC)   AKI (acute kidney injury) (HCC)   Chest pressure   Essential hypertension   Labile blood pressure   Dysphagia, post-stroke   Chronic pain of both knees   Right hemiparesis (HCC)   Elevated blood pressure reading   CKD (chronic kidney disease), stage II   Wheezing   Prediabetes   Dyspnea   Diastolic dysfunction   Morbid obesity (HCC) Hyperlipidemia  Discharged Condition: Stable  Significant Diagnostic Studies: CT ANGIO HEAD W OR WO CONTRAST  Addendum Date: 05/30/2020   ADDENDUM REPORT: 05/30/2020 13:23 ADDENDUM: Enlarged heterogeneous thyroid gland. Largest single nodule 2 cm. recommend thyroid ultrasound (ref: J Am Coll Radiol. 2015 Feb;12(2): 143-50). Electronically Signed   By: Paulina Fusi M.D.   On: 05/30/2020 13:23   Result Date: 05/30/2020 CLINICAL DATA:  Acute presentation with slurred speech and leg weakness. Small left pontine infarction suspected by MRI. EXAM: CT ANGIOGRAPHY HEAD AND NECK TECHNIQUE: Multidetector CT imaging of the head and neck was performed using the standard protocol during bolus administration of intravenous contrast. Multiplanar CT image reconstructions and MIPs were obtained to evaluate the vascular anatomy. Carotid stenosis measurements (when applicable) are obtained utilizing NASCET criteria, using the distal internal carotid diameter as the denominator. CONTRAST:  75mL OMNIPAQUE IOHEXOL 350 MG/ML SOLN COMPARISON:  MR studies done yesterday. FINDINGS: CTA NECK FINDINGS Aortic arch: Aortic atherosclerosis. No aneurysm or brachiocephalic vessel origin stenosis. 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 Right carotid system: Common  carotid artery is widely patent to the bifurcation region. Atherosclerotic plaque at carotid bifurcation and ICA bulb but without stenosis compared to the more distal cervical ICA diameter. Left carotid system: Common carotid artery widely patent to the bifurcation. Atherosclerotic plaque at the carotid bifurcation and ICA bulb but without stenosis compared to the more distal cervical ICA diameter. Vertebral arteries: Both vertebral artery origins are widely patent. Both vertebral arteries are widely patent through the cervical region to the foramen magnum. Skeleton: Ordinary cervical spondylosis. Right frontal calvarial mass as described at previous MRI. Other neck: No lymphadenopathy. Enlarged heterogeneous thyroid gland with multiple nodules, the largest measuring 2.1 cm. Upper chest: Negative Review of the MIP images confirms the above findings CTA HEAD FINDINGS Anterior circulation: Both internal carotid arteries are patent through the skull base and siphon regions. Ordinary siphon atherosclerotic calcification but without stenosis greater than 30%. The anterior and middle cerebral vessels are patent without correctable proximal stenosis. The anterior temporal branch of the MCA on the left appears to be occluded. Posterior circulation: Both vertebral arteries are widely patent to the basilar. Mild atherosclerotic irregularity of the basilar artery with distal basilar narrowing of about 30%. Superior cerebellar and posterior cerebral arteries are patent. Distal PCA branch vessels show atherosclerotic irregularity. Venous sinuses: Patent and normal. Anatomic variants: None other significant. Review of the MIP images confirms the above findings IMPRESSION: Aortic atherosclerosis. 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 Atherosclerotic change at both carotid bifurcations but no stenosis. Atherosclerotic change of the carotid siphon regions but without stenosis greater than  30%. 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 This is probably chronic, similar to the MR angiography of  2018. Distal vessel atherosclerotic narrowing and irregularity of the MCA and PCA branches. Atherosclerotic irregularity of the basilar artery, maximal stenosis in the distal basilar region of approximately 30%. Right frontal calvarial lytic mass as described by MRI. Differential diagnosis is primarily metastatic disease versus myeloma versus intra osseous meningioma versus enlarging hemangioma. Electronically Signed: By: Paulina Fusi M.D. On: 05/30/2020 12:02   DG Chest 2 View  Result Date: 06/19/2020 CLINICAL DATA:  Shortness of breath. EXAM: CHEST - 2 VIEW COMPARISON:  03/04/2017. FINDINGS: Mediastinum and hilar structures normal. Heart size stable. No pulmonary venous congestion. Mild bibasilar atelectasis/infiltrates. Small right pleural effusion. No pneumothorax. Surgical clips right upper quadrant. IMPRESSION: Low lung volumes with mild bibasilar atelectasis/infiltrates. Small right pleural effusion. Electronically Signed   By: Maisie Fus  Register   On: 06/19/2020 14:31   CT ANGIO NECK W OR WO CONTRAST  Addendum Date: 05/30/2020   ADDENDUM REPORT: 05/30/2020 13:23 ADDENDUM: Enlarged heterogeneous thyroid gland. Largest single nodule 2 cm. recommend thyroid ultrasound (ref: J Am Coll Radiol. 2015 Feb;12(2): 143-50). Electronically Signed   By: Paulina Fusi M.D.   On: 05/30/2020 13:23   Result Date: 05/30/2020 CLINICAL DATA:  Acute presentation with slurred speech and leg weakness. Small left pontine infarction suspected by MRI. EXAM: CT ANGIOGRAPHY HEAD AND NECK TECHNIQUE: Multidetector CT imaging of the head and neck was performed using the standard protocol during bolus administration of intravenous contrast. Multiplanar CT image reconstructions and MIPs were obtained to evaluate the vascular anatomy. Carotid stenosis measurements (when applicable) are  obtained utilizing NASCET criteria, using the distal internal carotid diameter as the denominator. CONTRAST:  75mL OMNIPAQUE IOHEXOL 350 MG/ML SOLN COMPARISON:  MR studies done yesterday. FINDINGS: CTA NECK FINDINGS Aortic arch: Aortic atherosclerosis. No aneurysm or brachiocephalic vessel origin stenosis. 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 Right carotid system: Common carotid artery is widely patent to the bifurcation region. Atherosclerotic plaque at carotid bifurcation and ICA bulb but without stenosis compared to the more distal cervical ICA diameter. Left carotid system: Common carotid artery widely patent to the bifurcation. Atherosclerotic plaque at the carotid bifurcation and ICA bulb but without stenosis compared to the more distal cervical ICA diameter. Vertebral arteries: Both vertebral artery origins are widely patent. Both vertebral arteries are widely patent through the cervical region to the foramen magnum. Skeleton: Ordinary cervical spondylosis. Right frontal calvarial mass as described at previous MRI. Other neck: No lymphadenopathy. Enlarged heterogeneous thyroid gland with multiple nodules, the largest measuring 2.1 cm. Upper chest: Negative Review of the MIP images confirms the above findings CTA HEAD FINDINGS Anterior circulation: Both internal carotid arteries are patent through the skull base and siphon regions. Ordinary siphon atherosclerotic calcification but without stenosis greater than 30%. The anterior and middle cerebral vessels are patent without correctable proximal stenosis. The anterior temporal branch of the MCA on the left appears to be occluded. Posterior circulation: Both vertebral arteries are widely patent to the basilar. Mild atherosclerotic irregularity of the basilar artery with distal basilar narrowing of about 30%. Superior cerebellar and posterior cerebral arteries are patent. Distal PCA branch vessels show atherosclerotic  irregularity. Venous sinuses: Patent and normal. Anatomic variants: None other significant. Review of the MIP images confirms the above findings IMPRESSION: Aortic atherosclerosis. 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 Atherosclerotic change at both carotid bifurcations but no stenosis. Atherosclerotic change of the carotid siphon regions but without stenosis greater than 30%. Apparent  occlusion of the anterior temporal branch of the left middle cerebral artery. This is probably chronic, similar to the MR angiography of 2018. Distal vessel atherosclerotic narrowing and irregularity of the MCA and PCA branches. Atherosclerotic irregularity of the basilar artery, maximal stenosis in the distal basilar region of approximately 30%. Right frontal calvarial lytic mass as described by MRI. Differential diagnosis is primarily metastatic disease versus myeloma versus intra osseous meningioma versus enlarging hemangioma. Electronically Signed: By: Paulina Fusi M.D. On: 05/30/2020 12:02   MR ANGIO HEAD WO CONTRAST  Result Date: 05/30/2020 CLINICAL DATA:  Initial evaluation for acute stroke, slurred speech, right-sided weakness. EXAM: MRI HEAD WITHOUT CONTRAST MRA HEAD WITHOUT CONTRAST TECHNIQUE: Multiplanar, multiecho pulse sequences of the brain and surrounding structures were obtained without intravenous contrast. Angiographic images of the head were obtained using MRA technique without contrast. COMPARISON:  Prior CT from earlier same day as well as previous MRI from 03/04/2017. FINDINGS: MRI HEAD FINDINGS Brain: Examination significantly degraded by motion artifact. Diffuse prominence of the CSF containing spaces compatible generalized age-related cerebral atrophy. Patchy T2/FLAIR hyperintensity within the periventricular and deep white matter both cerebral hemispheres most consistent with chronic small vessel ischemic disease, moderate in nature. Multiple remote lacunar  infarcts present about the bilateral basal ganglia and right thalamus. Subtle approximate 6 mm focus of diffusion abnormality seen involving the left paramedian pons, suspicious for an acute ischemic infarct (series 2, image 15). No visible associated hemorrhage or mass effect on this motion degraded exam. No other diffusion abnormality to suggest acute or subacute ischemia. Gray-white matter differentiation otherwise maintained. No other areas of remote cortical infarction. No foci of susceptibility artifact to suggest acute or chronic intracranial hemorrhage on this motion degraded exam. 2.8 cm mass involving the right frontal calvarium with extraosseous extension again seen, indeterminate. No associated mass effect on the subjacent right frontal lobe. No other mass lesion. No mass effect or midline shift. No hydrocephalus or extra-axial fluid collection. Pituitary gland grossly within normal limits. Midline structures intact. Vascular: Major intracranial vascular flow voids are maintained. Skull and upper cervical spine: Craniocervical junction within normal limits. Upper cervical spine normal. Bone marrow signal intensity within normal limits. No other focal marrow replacing lesions. Scalp soft tissues otherwise unremarkable. Sinuses/Orbits: Globes and orbital soft tissues within normal limits. Visualized paranasal sinuses are grossly clear. No significant mastoid effusion. Other: None. MRA HEAD FINDINGS ANTERIOR CIRCULATION: Examination moderately degraded by motion artifact. Visualized distal cervical segments of the internal carotid arteries are patent with antegrade flow. Petrous segments widely patent. Scattered atheromatous irregularity throughout the cavernous/supraclinoid ICAs without hemodynamically significant stenosis. Right A1 segment widely patent. Left A1 hypoplastic and/or absent, accounting for the diminutive left ICA is compared to the right. Normal anterior communicating artery complex.  Anterior cerebral arteries patent to their distal aspects without appreciable stenosis. M1 segments patent bilaterally. Grossly normal MCA bifurcations. Distal MCA branches well perfused and fairly symmetric. POSTERIOR CIRCULATION: Vertebral arteries patent to the vertebrobasilar junction without stenosis. Left vertebral artery slightly dominant. Neither PICA origin visualized. Basilar widely patent proximally. There is a short-segment moderate stenosis involving the mid basilar artery (series 2, image 91). Basilar otherwise patent to its distal aspect. Superior cerebral arteries grossly patent proximally. PCAs primarily supplied via the basilar, both of which are grossly patent to their distal aspects without definite stenosis, although evaluation limited by motion. No intracranial aneurysm. IMPRESSION: MRI HEAD IMPRESSION: 1. Motion degraded exam. 2. Subtle approximate 6 mm acute ischemic nonhemorrhagic left pontine infarct. 3. 2.8  cm mass involving the right frontal calvarium with extraosseous extension, indeterminate. In retrospect, this lesion appears to have been present on prior MRI from 2018, although is Aritha larger on today's exam. Again, primary differential considerations include a possible intra osseous meningioma versus osseous metastasis. Neuro surgical consultation and referral for consideration of possible histologic sampling suggested for further evaluation. Additionally, postcontrast imaging could be performed for complete evaluation as clinically desired. 4. Underlying age-related cerebral atrophy with moderate chronic microvascular ischemic disease, with multiple remote lacunar infarcts about the bilateral basal ganglia and right thalamus. MRA HEAD IMPRESSION: 1. Motion degraded exam. 2. Negative intracranial MRA for large vessel occlusion. 3. 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 4. Additional scattered intracranial atherosclerotic irregularity, with no other  hemodynamically significant or correctable stenosis identified. Electronically Signed   By: Rise MuBenjamin  McClintock M.D.   On: 05/30/2020 00:34   MR BRAIN WO CONTRAST  Result Date: 05/31/2020 CLINICAL DATA:  Pontine infarct, follow-up EXAM: MRI HEAD WITHOUT CONTRAST TECHNIQUE: Multiplanar, multiecho pulse sequences of the brain and surrounding structures were obtained without intravenous contrast. Specifically, axial and coronal DWI and axial T2 FLAIR sequences were obtained. COMPARISON:  05/29/2020 FINDINGS: Brain: Increase in size of acute infarction involving left parasagittal pons. This now measures approximately up to 13 mm (previously 6 mm). Ventricles are stable in size. Findings of chronic microvascular ischemic changes and chronic small vessel infarct again noted. Vascular: Major vessel flow voids at the skull base are preserved. Skull and upper cervical spine: Right frontal calvarial lesion with extraosseous extension is again identified and incompletely evaluated on this study. Sinuses/Orbits: Mild paranasal sinus mucosal thickening. Orbits are unremarkable. Other: None. IMPRESSION: Increase in size of small acute infarct of the left parasagittal pons. Electronically Signed   By: Guadlupe SpanishPraneil  Beth Spackman M.D.   On: 05/31/2020 19:31   MR BRAIN WO CONTRAST  Result Date: 05/30/2020 CLINICAL DATA:  Initial evaluation for acute stroke, slurred speech, right-sided weakness. EXAM: MRI HEAD WITHOUT CONTRAST MRA HEAD WITHOUT CONTRAST TECHNIQUE: Multiplanar, multiecho pulse sequences of the brain and surrounding structures were obtained without intravenous contrast. Angiographic images of the head were obtained using MRA technique without contrast. COMPARISON:  Prior CT from earlier same day as well as previous MRI from 03/04/2017. FINDINGS: MRI HEAD FINDINGS Brain: Examination significantly degraded by motion artifact. Diffuse prominence of the CSF containing spaces compatible generalized age-related cerebral atrophy.  Patchy T2/FLAIR hyperintensity within the periventricular and deep white matter both cerebral hemispheres most consistent with chronic small vessel ischemic disease, moderate in nature. Multiple remote lacunar infarcts present about the bilateral basal ganglia and right thalamus. Subtle approximate 6 mm focus of diffusion abnormality seen involving the left paramedian pons, suspicious for an acute ischemic infarct (series 2, image 15). No visible associated hemorrhage or mass effect on this motion degraded exam. No other diffusion abnormality to suggest acute or subacute ischemia. Gray-white matter differentiation otherwise maintained. No other areas of remote cortical infarction. No foci of susceptibility artifact to suggest acute or chronic intracranial hemorrhage on this motion degraded exam. 2.8 cm mass involving the right frontal calvarium with extraosseous extension again seen, indeterminate. No associated mass effect on the subjacent right frontal lobe. No other mass lesion. No mass effect or midline shift. No hydrocephalus or extra-axial fluid collection. Pituitary gland grossly within normal limits. Midline structures intact. Vascular: Major intracranial vascular flow voids are maintained. Skull and upper cervical spine: Craniocervical junction within normal limits. Upper cervical spine normal. Bone marrow signal intensity within normal  limits. No other focal marrow replacing lesions. Scalp soft tissues otherwise unremarkable. Sinuses/Orbits: Globes and orbital soft tissues within normal limits. Visualized paranasal sinuses are grossly clear. No significant mastoid effusion. Other: None. MRA HEAD FINDINGS ANTERIOR CIRCULATION: Examination moderately degraded by motion artifact. Visualized distal cervical segments of the internal carotid arteries are patent with antegrade flow. Petrous segments widely patent. Scattered atheromatous irregularity throughout the cavernous/supraclinoid ICAs without  hemodynamically significant stenosis. Right A1 segment widely patent. Left A1 hypoplastic and/or absent, accounting for the diminutive left ICA is compared to the right. Normal anterior communicating artery complex. Anterior cerebral arteries patent to their distal aspects without appreciable stenosis. M1 segments patent bilaterally. Grossly normal MCA bifurcations. Distal MCA branches well perfused and fairly symmetric. POSTERIOR CIRCULATION: Vertebral arteries patent to the vertebrobasilar junction without stenosis. Left vertebral artery slightly dominant. Neither PICA origin visualized. Basilar widely patent proximally. There is a short-segment moderate stenosis involving the mid basilar artery (series 2, image 91). Basilar otherwise patent to its distal aspect. Superior cerebral arteries grossly patent proximally. PCAs primarily supplied via the basilar, both of which are grossly patent to their distal aspects without definite stenosis, although evaluation limited by motion. No intracranial aneurysm. IMPRESSION: MRI HEAD IMPRESSION: 1. Motion degraded exam. 2. Subtle approximate 6 mm acute ischemic nonhemorrhagic left pontine infarct. 3. 2.8 cm mass involving the right frontal calvarium with extraosseous extension, indeterminate. In retrospect, this lesion appears to have been present on prior MRI from 2018, although is Kielyn larger on today's exam. Again, primary differential considerations include a possible intra osseous meningioma versus osseous metastasis. Neuro surgical consultation and referral for consideration of possible histologic sampling suggested for further evaluation. Additionally, postcontrast imaging could be performed for complete evaluation as clinically desired. 4. Underlying age-related cerebral atrophy with moderate chronic microvascular ischemic disease, with multiple remote lacunar infarcts about the bilateral basal ganglia and right thalamus. MRA HEAD IMPRESSION: 1. Motion degraded exam.  2. Negative intracranial MRA for large vessel occlusion. 3. 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 4. Additional scattered intracranial atherosclerotic irregularity, with no other hemodynamically significant or correctable stenosis identified. Electronically Signed   By: Rise Mu M.D.   On: 05/30/2020 00:34   DG Swallowing Func-Speech Pathology  Result Date: 06/01/2020 Objective Swallowing Evaluation: Type of Study: MBS-Modified Barium Swallow Study  Patient Details Name: Ann Coleman MRN: [id number] Date of Birth: 1924-03-15 Today's Date: 06/01/2020 Time: SLP Start Time (ACUTE ONLY): 1100 -SLP Stop Time (ACUTE ONLY): 1125 SLP Time Calculation (min) (ACUTE ONLY): 25 min Past Medical History: Past Medical History: Diagnosis Date  High cholesterol   Hypertension   Stroke [facility])  Past Surgical History: Past Surgical History: Procedure Laterality Date  NO PAST SURGERIES   HPI: Akari Bellisario is a 86 y.o. [name] speaking female with medical history significant of stroke in 2018, hypertension, hyperlipidemia.  Daughter reported pt could not get up to use the bathroom as her legs were weak.  Family also noticed that her speech was slurred.  When EMS arrived, they noticed that patient had right arm drift.  stat brain MRI was ordered and reviewed by neurology -showing an acute left pontine stroke which is felt to be the likely explanation for her right-sided weakness. Pt passed [place] in ED, but struggled taking a pill later.  No data recorded Assessment / Plan / Recommendation CHL IP CLINICAL IMPRESSIONS 05/31/2020 Clinical Impression Pt demonstrates a primary oral dysphagia with buccal weakness but strong lingual manipulation. With extra time she is able to  gather boluses over two piecemeal efforts for propulsion to pharynx. She has instances of trace oral residue with liquids that she does gather and propel, but does not immediately swallow, which resulted in some trace sensed aspiration. Pharyngeal  impairment is mostly characterized by delayed initiation with trace sensed aspiration of thin liquids before the swallow, seem with straw sips or very large sips. Pt is recommended to consume nectar thick drinks and soft ground (dys 2) solids (food from home permitted) and can also have teaspoons of thin soup. SLP will f/u, likely will be able to uprgrade to thin with further training for small sips and a dry swallow to fully clear residue.  SLP Visit Diagnosis Dysphagia, oral phase (R13.11);Dysphagia, oropharyngeal phase (R13.12) Attention and concentration deficit following -- Frontal lobe and executive function deficit following -- Impact on safety and function Mild aspiration risk   CHL IP TREATMENT RECOMMENDATION 05/31/2020 Treatment Recommendations Therapy as outlined in treatment plan below   Prognosis 05/31/2020 Prognosis for Safe Diet Advancement Good Barriers to Reach Goals -- Barriers/Prognosis Comment -- CHL IP DIET RECOMMENDATION 05/31/2020 SLP Diet Recommendations Dysphagia 2 (Fine chop) solids;Nectar thick liquid Liquid Administration via Cup;Straw Medication Administration Whole meds with liquid Compensations Slow rate;Small sips/bites;Lingual sweep for clearance of pocketing;Multiple dry swallows after each bite/sip Postural Changes Remain semi-upright after after feeds/meals (Comment)   CHL IP OTHER RECOMMENDATIONS 05/31/2020 Recommended Consults -- Oral Care Recommendations Oral care BID Other Recommendations --   CHL IP FOLLOW UP RECOMMENDATIONS 05/31/2020 Follow up Recommendations Inpatient Rehab   CHL IP FREQUENCY AND DURATION 05/31/2020 Speech Therapy Frequency (ACUTE ONLY) min 2x/week Treatment Duration 2 weeks      CHL IP ORAL PHASE 05/31/2020 Oral Phase Impaired Oral - Pudding Teaspoon -- Oral - Pudding Cup -- Oral - Honey Teaspoon -- Oral - Honey Cup -- Oral - Nectar Teaspoon -- Oral - Nectar Cup Decreased bolus cohesion;Left pocketing in lateral sulci;Right pocketing in lateral sulci;Lingual/palatal  residue;Delayed oral transit Oral - Nectar Straw Decreased bolus cohesion;Left pocketing in lateral sulci;Right pocketing in lateral sulci;Lingual/palatal residue;Delayed oral transit Oral - Thin Teaspoon -- Oral - Thin Cup Decreased bolus cohesion;Left pocketing in lateral sulci;Right pocketing in lateral sulci;Lingual/palatal residue;Delayed oral transit Oral - Thin Straw Decreased bolus cohesion;Left pocketing in lateral sulci;Right pocketing in lateral sulci;Lingual/palatal residue;Delayed oral transit Oral - Puree Decreased bolus cohesion;Left pocketing in lateral sulci;Right pocketing in lateral sulci;Lingual/palatal residue;Delayed oral transit Oral - Mech Soft Decreased bolus cohesion;Left pocketing in lateral sulci;Right pocketing in lateral sulci;Lingual/palatal residue;Delayed oral transit Oral - Regular -- Oral - Multi-Consistency -- Oral - Pill -- Oral Phase - Comment Pt demonstrates a primary oral dysphagia with buccal weakness but strong lingual manipulation. With extra time she is albe to gather boluses over two piecemeal efforts for propulsion to pharynx. She has instances of trace oral residue with liquids that she does gather and propel, but does not immediately swallow, which resuleted in some trace sensed aspiration. Pharyngeal impairment is mostly characterized by delayed initiation with trace sensed aspriation of thin liquids before the swallow, seem with stra sips or very large sips. Pt is recommended to consume nectar thick drinks and soft ground (dys 2) solids (food from home permitted) and can also have teaspoons of thin soup. SLP will f/u, likely will be able to uprgrade to thin with further training for small sips and a dry swallow to fully clear residue.   CHL IP PHARYNGEAL PHASE 05/31/2020 Pharyngeal Phase Impaired Pharyngeal- Pudding Teaspoon -- Pharyngeal -- Pharyngeal- Pudding Cup --  Pharyngeal -- Pharyngeal- Honey Teaspoon -- Pharyngeal -- Pharyngeal- Honey Cup -- Pharyngeal --  Pharyngeal- Nectar Teaspoon -- Pharyngeal -- Pharyngeal- Nectar Cup Delayed swallow initiation-pyriform sinuses Pharyngeal -- Pharyngeal- Nectar Straw Delayed swallow initiation-pyriform sinuses Pharyngeal -- Pharyngeal- Thin Teaspoon -- Pharyngeal -- Pharyngeal- Thin Cup Delayed swallow initiation-pyriform sinuses;Penetration/Aspiration before swallow;Penetration/Apiration after swallow Pharyngeal Material enters airway, passes BELOW cords then ejected out Pharyngeal- Thin Straw Penetration/Aspiration before swallow;Delayed swallow initiation-pyriform sinuses Pharyngeal Material enters airway, passes BELOW cords then ejected out Pharyngeal- Puree Delayed swallow initiation-vallecula Pharyngeal -- Pharyngeal- Mechanical Soft Delayed swallow initiation-vallecula Pharyngeal -- Pharyngeal- Regular -- Pharyngeal -- Pharyngeal- Multi-consistency -- Pharyngeal -- Pharyngeal- Pill -- Pharyngeal -- Pharyngeal Comment --  No flowsheet data found. DeBlois, [name] 06/01/2020, 10:49 AM              ECHOCARDIOGRAM COMPLETE  Result Date: 05/30/2020    ECHOCARDIOGRAM REPORT   Patient Name:   [facility]    Date of Exam: 05/30/2020 Medical Rec #:  [id number]  Height:       59.0 in Accession #:    [id number] Weight:       151.5 lb Date of Birth:  [birth year]   BSA:          1.639 m Patient Age:    89 years   BP:           136/101 mmHg Patient Gender: F          HR:           75 bpm. Exam Location:  Inpatient Procedure: 2D Echo, Cardiac Doppler and Color Doppler Indications:    Stroke 434.91 / I163.9  History:        Patient has prior history of Echocardiogram examinations, most                 recent 03/06/2017. Risk Factors:Hypertension and Dyslipidemia.  Sonographer:    Elmarie Shiley Dance Referring Phys: [id number] VASUNDHRA RATHORE IMPRESSIONS  1. Intracavitary gradient. Peak velocity 2.1 m/s. Peak gradient 17.6 mmHg. Left ventricular ejection fraction, by estimation, is >75%. The left ventricle has hyperdynamic function. The left  ventricle has no regional wall motion abnormalities. There is mild concentric left ventricular hypertrophy. Left ventricular diastolic parameters are consistent with Grade I diastolic dysfunction (impaired relaxation).  2. Right ventricular systolic function is normal. The right ventricular size is normal. There is normal pulmonary artery systolic pressure.  3. Left atrial size was mildly dilated.  4. The mitral valve is normal in structure. No evidence of mitral valve regurgitation. No evidence of mitral stenosis.  5. The aortic valve is tricuspid. Aortic valve regurgitation is mild. No aortic stenosis is present.  6. The inferior vena cava is normal in size with greater than 50% respiratory variability, suggesting right atrial pressure of 3 mmHg. FINDINGS  Left Ventricle: Intracavitary gradient. Peak velocity 2.1 m/s. Peak gradient 17.6 mmHg. Left ventricular ejection fraction, by estimation, is >75%. The left ventricle has hyperdynamic function. The left ventricle has no regional wall motion abnormalities. The left ventricular internal cavity size was normal in size. There is mild concentric left ventricular hypertrophy. Left ventricular diastolic parameters are consistent with Grade I diastolic dysfunction (impaired relaxation). Right Ventricle: The right ventricular size is normal. No increase in right ventricular wall thickness. Right ventricular systolic function is normal. There is normal pulmonary artery systolic pressure. The tricuspid regurgitant velocity is 1.67 m/s, and  with an assumed right atrial pressure of 3 mmHg, the estimated right ventricular systolic pressure  is 14.2 mmHg. Left Atrium: Left atrial size was mildly dilated. Right Atrium: Right atrial size was normal in size. Pericardium: A small pericardial effusion is present. There is no evidence of cardiac tamponade. Mitral Valve: The mitral valve is normal in structure. Normal mobility of the mitral valve leaflets. No evidence of mitral valve  regurgitation. No evidence of mitral valve stenosis. Tricuspid Valve: The tricuspid valve is normal in structure. Tricuspid valve regurgitation is trivial. No evidence of tricuspid stenosis. Aortic Valve: The aortic valve is tricuspid. Aortic valve regurgitation is mild. Aortic regurgitation PHT measures 300 msec. No aortic stenosis is present. Pulmonic Valve: The pulmonic valve was normal in structure. Pulmonic valve regurgitation is trivial. No evidence of pulmonic stenosis. Aorta: The aortic root is normal in size and structure. Venous: The inferior vena cava is normal in size with greater than 50% respiratory variability, suggesting right atrial pressure of 3 mmHg. IAS/Shunts: No atrial level shunt detected by color flow Doppler.  LEFT VENTRICLE PLAX 2D LVIDd:         3.21 cm LVIDs:         2.24 cm LV PW:         1.26 cm LV IVS:        1.20 cm LVOT diam:     2.00 cm LV SV:         75 LV SV Index:   46 LVOT Area:     3.14 cm  RIGHT VENTRICLE            IVC RV Basal diam:  2.16 cm    IVC diam: 1.53 cm RV S prime:     9.03 cm/s TAPSE (M-mode): 1.9 cm LEFT ATRIUM             Index       RIGHT ATRIUM           Index LA diam:        4.20 cm 2.56 cm/m  RA Area:     12.50 cm LA Vol (A2C):   32.7 ml 19.95 ml/m RA Volume:   27.70 ml  16.90 ml/m LA Vol (A4C):   50.2 ml 30.63 ml/m LA Biplane Vol: 41.9 ml 25.57 ml/m  AORTIC VALVE LVOT Vmax:   101.00 cm/s LVOT Vmean:  75.100 cm/s LVOT VTI:    0.238 m AI PHT:      300 msec  AORTA Ao Root diam: 2.90 cm Ao Asc diam:  3.50 cm MITRAL VALVE                TRICUSPID VALVE MV Area (PHT): 2.50 cm     TR Peak grad:   11.2 mmHg MV Decel Time: 303 msec     TR Vmax:        167.00 cm/s MV E velocity: 69.20 cm/s MV A velocity: 127.00 cm/s  SHUNTS MV E/A ratio:  0.54         Systemic VTI:  0.24 m                             Systemic Diam: 2.00 cm Chilton Si MD Electronically signed by Chilton Si MD Signature Date/Time: 05/30/2020/12:19:32 PM    Final    CT HEAD CODE  STROKE WO CONTRAST  Result Date: 05/29/2020 CLINICAL DATA:  Code stroke. Initial evaluation for acute slurred speech, leg weakness. EXAM: CT HEAD WITHOUT CONTRAST TECHNIQUE: Contiguous axial images were obtained from the base of the  skull through the vertex without intravenous contrast. COMPARISON:  Prior study from 03/04/2017. FINDINGS: Brain: Generalized age-related cerebral atrophy with moderate chronic microvascular ischemic disease. Scattered remote lacunar infarcts present about the bilateral basal ganglia and right internal capsule/thalamus. No acute intracranial hemorrhage. No acute large vessel territory infarct. No parenchymal mass lesion, mass effect, or midline shift. No hydrocephalus or extra-axial fluid collection. There is a hyperdense ovoid lesion measuring approximately 2.5 cm positioned within the right frontal calvarium (series 3, image 18). Upon review of prior CT from 2018, this lesion was likely present, although faintly visible at that time. Associated lytic changes within the underlying calvarium with probable associated pathologic fracture (series 4, image 46). Soft tissue component extends into the overlying right frontal scalp as well. Vascular: No hyperdense vessel. Calcified atherosclerosis present at the skull base. Skull: 2.5 cm lytic lesion involving the right frontal calvarium as above. Calvarium otherwise intact. No other scalp soft tissue abnormality. Sinuses/Orbits: Globes and orbital soft tissues within normal limits. Scattered mucosal thickening noted within the visualized ethmoidal air cells. Visualized paranasal sinuses are otherwise clear. No mastoid effusion. Other: None. ASPECTS [facility] Stroke Program Early CT Score) - Ganglionic level infarction (caudate, lentiform nuclei, internal capsule, insula, M1-M3 cortex): 7 - Supraganglionic infarction (M4-M6 cortex): 3 Total score (0-10 with 10 being normal): 10 IMPRESSION: 1. No acute intracranial infarct or other abnormality.  2. ASPECTS is 10. 3. Approximate 2.5 cm lytic lesion involving the right frontal calvarium with probable associated pathologic fracture. Finding is nonspecific, although is suspected to have been present on previous CT from 2018, although only faintly visible at that time. Primary differential consideration consists of an intra osseous meningioma, although possible osseous metastasis not excluded. Further evaluation with dedicated brain MRI, with and without contrast suggested for further evaluation. Additionally, neuro surgical consultation and referral for possible histologic sampling suggested. 4. Underlying age-related cerebral atrophy with moderate chronic microvascular ischemic disease, with multiple remote lacunar infarcts about the bilateral basal ganglia as above. Critical Value/emergent results were called by telephone at the time of interpretation on 05/29/2020 at 10:32 pm to provider [facility] , who verbally acknowledged these results. Electronically Signed   By: Rise Mu M.D.   On: 05/29/2020 22:36    Labs:  Basic Metabolic Panel: Recent Labs  Lab 06/15/20 0648 06/18/20 1243  NA 140 140  K 4.2 3.9  CL 108 106  CO2 24 24  GLUCOSE 106* 172*  BUN 12 11  CREATININE 0.86 0.83  CALCIUM 8.5* 9.0    CBC: Recent Labs  Lab 06/15/20 0648 06/21/20 0546  WBC 5.0 5.1  NEUTROABS 2.5 2.1  HGB 12.1 12.4  HCT 38.5 39.8  MCV 84.1 82.1  PLT 200 270    CBG: No results for input(s): GLUCAP in the last 168 hours.  Family history.  History of hypertension.  Denies any CVA diabetes mellitus esophageal cancer or rectal cancer  Brief HPI:   Ann Coleman is a 86 y.o. right-handed non-English-speaking [name] female with history of hypertension as well as hyperlipidemia CVA 3 years ago.  Per chart review lives with her children 1 level home 2 steps to entry.  Reportedly independent prior to admission using a walker for assistance.  Presented 05/29/2020 with  right-sided weakness and  slurred speech.  Noted systolic blood pressure in the 190s.  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  In retrospect of this lesion appeared to have been present on her prior MRI from 2018.  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  This did appear chronic and again noted on MRI 2018.  Echocardiogram with ejection fraction 75% no wall motion abnormalities.  MRI of the brain was again completed 05/31/2020 due to increased left side weakness showing mild increase in size of small acute infarct of the left parasagittal pons.  Admission chemistries BUN 5 creatinine 1.03 SARS current virus negative.  She was on a dysphagia #2 nectar thick liquid diet.  Maintain on aspirin and Plavix for CVA prophylaxis x3 weeks then aspirin alone.  Subcutaneous Lovenox for DVT prophylaxis.  Therapy evaluations completed and patient was admitted for a comprehensive rehab program   Hospital Course: Jalasia Demartin was admitted to rehab 06/02/2020 for inpatient therapies to consist of PT, ST and OT at least three hours five days a week. Past admission physiatrist, therapy team and rehab RN have worked together to provide customized collaborative inpatient rehab.  Pertaining to patient's acute left pontine infarction she remained on aspirin and Plavix x3 weeks since completed she would continue aspirin 81 mg daily for CVA prophylaxis.  Lovenox for DVT prophylaxis no bleeding episodes.  Pain management with bilateral knee arthritic changes Voltaren gel as ordered she was also using a Lidoderm patch.  Diet have been advanced to mechanical soft thin liquids tolerating with speech therapy follow-up.  Lipitor for hyperlipidemia.  Blood pressure monitored while on lisinopril 10 mg daily and would  need follow-up with her primary MD.  CKD stage II with latest creatinine 0.83.  Prediabetes with hemoglobin A1c 6.1 she was transitioned to a carb modified diet.  Intermittent wheezing chest x-ray showed some low lung volumes with mild bibasilar atelectasis small right pleural effusion.  Her oxygen saturations greater than 90% on room air   Blood pressures were monitored on TID basis and with some permissive elevations  Diabetes has been monitored with ac/hs CBG checks and SSI was use prn for tighter BS control.    Rehab course: During patient's stay in rehab weekly team conferences were held to monitor patient's progress, set goals and discuss barriers to discharge. At admission, patient required +2 physical assist supine to sit moderate assist sit to supine.  Moderate assist upper body bathing total assist lower body bathing minimal assist upper body dressing total assist lower body dressing  Physical exam.  Blood pressure 139/80 pulse 81 temperature 98.2 respirations 20 oxygen saturation 97% room air Neurological Comments.  Alert no acute distress follow simple demonstrated commands family at bedside to help interpret as patient is able to provide her name and age.  Speech was a bit dysarthric but intelligible as per family HEENT Head.  Normocephalic and atraumatic Eyes.  Pupils round and reactive to light no discharge.nystagmus Neck.  Supple nontender no JVD without thyromegaly Cardiac regular rate rhythm without any extra sounds or murmur heard Respiratory effort normal no respiratory distress Abdomen.  Soft nontender positive bowel sounds without rebound Extremities.  No clubbing cyanosis or edema Skin.  No evidence of rash or breakdown Neurological.  Cranial nerves II through XII intact motor right shoulder abduction 2/5 elbow flexion extension to minus/5 distally 2+/5 unchanged right lower extremity hip flexion 2/5 knee extension 2/5 distally 0/5  /She  has had improvement in activity  tolerance, balance, postural control as well as ability  to compensate for deficits. Francis Dowse has had improvement in functional use RUE/LUE  and RLE/LLE as well as improvement in awareness.  Patient demonstrated deficits in muscle weakness decreased cardiorespiratory endurance impaired timing and sequencing.  Supine to side to sit with mod assist and cues for sequencing.  Scooting in sitting with moderate assist to fully advance right hip.  Wheelchair to mat with right max assist.  Sit to supine with mod max assist of 1.  Ongoing family teaching.  Educated patient and family on toileting transfers with 3 in 1 commode patient's daughter assisted patient with squat pivot transfers with supervision to contact-guard assist intermittently.  Full family teaching was completed plan discharge to home       Disposition: Discharge disposition: 01-Home or Self Care     Discharged to home   Diet: Carb modified diet  Special Instructions: No driving smoking or alcohol  1.Tylenol as needed 2.aspirin 81 mg daily 3.lipitor 40 mg daily 4.  Voltaren gel 2 g 4 times daily 5.  Lidoderm patch 2 patches change as directed 6.  Lisinopril 10 mg p.o. daily  30-35 minutes were spent completing discharge summary and discharge planning  Discharge Instructions     Ambulatory referral to Neurology   Complete by: As directed    An appointment is requested in approximately 4 weeks left pontine infarction   Ambulatory referral to Physical Medicine Rehab   Complete by: As directed    Moderate complexity follow-up 1 to 2 weeks left pontine infarction        Follow-up Information     Marcello Fennel, MD Follow up.   Specialty: Physical Medicine and Rehabilitation Why: Office to call for appointment Contact information: [address] [place] [address] [phone]                 Signed: Charlton Amor 06/22/2020, 5:06 AM Patient was seen, face-face, and physical exam performed  by me on day of discharge, greater than 30 minutes of total time spent.. Please see progress note from day of discharge as well.  Maryla Morrow, MD, ABPMR

## 2020-06-20 NOTE — Progress Notes (Signed)
Patient ID: Ann Coleman, female   DOB: Jun 02, 1924, 86 y.o.   MRN: [id number] Team Conference Report to Patient/Family  Team Conference discussion was reviewed with the patient and caregiver, including goals, any changes in plan of care and target discharge date.  Patient and caregiver express understanding and are in agreement.  The patient has a target discharge date of 06/22/20.  Andria Rhein 06/20/2020, 1:50 PM

## 2020-06-20 NOTE — Patient Care Conference (Signed)
Inpatient RehabilitationTeam Conference and Plan of Care Update Date: 06/20/2020   Time: 2:25 PM    Patient Name: Ann Coleman      Medical Record Number: [id number]  Date of Birth: [birth year] Sex: Female         Room/Bed: 4W25C/4W25C-01 Payor Info: Payor: MEDICARE / Plan: MEDICARE PART A AND B / Product Type: *No Product type* /    Admit Date/Time:  06/02/2020  3:56 PM  Primary Diagnosis:  Brainstem infarct, acute [facility])  Hospital Problems: Principal Problem:   Brainstem infarct, acute (HCC) Active Problems:   Left pontine cerebrovascular accident (HCC)   AKI (acute kidney injury) (HCC)   Chest pressure   Essential hypertension   Labile blood pressure   Dysphagia, post-stroke   Chronic pain of both knees   Right hemiparesis (HCC)   Elevated blood pressure reading   CKD (chronic kidney disease), stage II   Wheezing   Prediabetes   Dyspnea   Diastolic dysfunction   Morbid obesity [facility])    Expected Discharge Date: Expected Discharge Date: 06/22/20  Team Members Present: Physician leading conference: Dr. Maryla Morrow Care Coodinator Present: Chana Bode, RN, BSN, CRRN;Christina Vita Barley, BSW Nurse Present: Harle Battiest, RN PT Present: Casimiro Needle, PT OT Present: Primitivo Gauze, OT SLP Present: Suzzette Righter, CF-SLP PPS Coordinator present : Fae Pippin, SLP     Current Status/Progress Goal Weekly Team Focus  Bowel/Bladder   Incontinent of B/B  Regain continence of B/B  Timed toileting   Swallow/Nutrition/ Hydration             ADL's   mod A squat pivot, min to mod UB self care, max -total LB self care, developing RUE active use and strength  downgraded to mod assist  RUE NMR, functional transfers, ADL training, caregiver training   Mobility             Communication             Safety/Cognition/ Behavioral Observations            Pain   No Pain  Remian Pain free  Assess pain QShift   Skin   Ecchmoysis to Abdomen  Improve skin integrity  Assess skin QShift      Team Discussion:  Discharge Planning/Teaching Needs:  Goal to discharge home with children to provide care inside home  Education scheduled with family   Current Update: on target  Current Barriers to Discharge:  None  Possible Resolutions to Barriers:   Patient on target to meet rehab goals: yes, MOD assist overall and HH follow up  *See Care Plan and progress notes for long and short-term goals.   Revisions to Treatment Plan:  MD ordered Incentive Spirometer for atelectasis and additional treatments for shortness of breath. Family education with daughter scheduled for 06/21/20 and review transfers. Wheelchair goals discontinued due to body habitus and functional level impaired by/not facilitated with use of equipment     Medical Summary Current Status: Right side hemiparesis and slurred speech with confusion secondary to Acute left pontine infarction Weekly Focus/Goal: Improve mobility, swallowing, BP, pleural effusions  Barriers to Discharge: Medical stability;Weight   Possible Resolutions to Barriers: Therapies, diuretics, optimize BP meds   Continued Need for Acute Rehabilitation Level of Care: The patient requires daily medical management by a physician with specialized training in physical medicine and rehabilitation for the following reasons: Direction of a multidisciplinary physical rehabilitation program to maximize functional independence : Yes Medical management of patient stability for increased activity  during participation in an intensive rehabilitation regime.: Yes Analysis of laboratory values and/or radiology reports with any subsequent need for medication adjustment and/or medical intervention. : Yes   I attest that I was present, lead the team conference, and concur with the assessment and plan of the team.   Chana Bode B 06/20/2020, 2:25 PM

## 2020-06-20 NOTE — Progress Notes (Signed)
Occupational Therapy Session Note  Patient Details  Name: Ann Coleman MRN: [id number] Date of Birth: 10-25-1924  Today's Date: 06/20/2020 OT Individual Time: 0905-1000 OT Individual Time Calculation (min): 55 min    Short Term Goals: Week 1:  OT Short Term Goal 1 (Week 1): Pt will don UB clothing with mod A OT Short Term Goal 1 - Progress (Week 1): Met OT Short Term Goal 2 (Week 1): Pt will sit EOB for ADL with no more than mod A for sitting balance OT Short Term Goal 2 - Progress (Week 1): Met OT Short Term Goal 3 (Week 1): Pt will demonstrate improved R attention with no more than mod cueing for scanning during ADL task OT Short Term Goal 3 - Progress (Week 1): Met OT Short Term Goal 4 (Week 1): Pt and caregiver will correctly position her RUE at rest with min A OT Short Term Goal 4 - Progress (Week 1): Progressing toward goal Week 2:  OT Short Term Goal 1 (Week 2): Pt will rise to stand to with min A to prep for LB dressing. OT Short Term Goal 1 - Progress (Week 2): Progressing toward goal OT Short Term Goal 2 (Week 2): Pt will don UB clothing with verbal cues. OT Short Term Goal 2 - Progress (Week 2): Not progressing (pt uses button up shirts and difficult for her to don herself) OT Short Term Goal 3 (Week 2): Pt will complete toilet transfers with mod A. OT Short Term Goal 3 - Progress (Week 2): Met OT Short Term Goal 4 (Week 2): Pt will don pants over feet with mod A. OT Short Term Goal 4 - Progress (Week 2): Discontinued (comment) (pt only uses skirts and dresses and incontinence briefs) Week 3:  OT Short Term Goal 1 (Week 3): STGs=LTGs  Skilled Therapeutic Interventions/Progress Updates:      Pt seen for BADL retraining of toileting, bathing, and dressing with a focus on sit to stands, standing tolerance and transfers.  Pt received in bed and was able to sit to EOB with min A and then completed stand pivot to w.c with mod A with A to block R knee. She then completed same transfer to  toilet, sit to stand for therapist to remove wet brief and pull up long skirt. Pt then had BM on toilet and was able to self cleanse.  Pt then transferred back to wc to bathe at sink.  Min A UB to help with hand over hand A to wash L arm.  Mod A with LB bathing.  Pt uses button up shirts so she needs mod A with donning and max to pull skirt over her head and over her hips.   Pt did c/o knee pain when standing, but she was able to tolerate being upright and held her stand for 30 seconds at a time.   Pt tolerated therapy well and resting in wc with interpretor in the room with her.   Therapy Documentation Precautions:  Precautions Precautions: Fall Precaution Comments: R hemiplegia Restrictions Weight Bearing Restrictions: No  Pain: c/o pain in knees only when standing   ADL: ADL Eating: Minimal assistance Where Assessed-Eating: Bed level Grooming: Minimal assistance Where Assessed-Grooming: Chair Upper Body Bathing: Moderate assistance, Moderate cueing Where Assessed-Upper Body Bathing: Sitting at sink Lower Body Bathing: Maximal assistance Where Assessed-Lower Body Bathing: Chair Upper Body Dressing: Maximal assistance Where Assessed-Upper Body Dressing: Sitting at sink Lower Body Dressing: Dependent Where Assessed-Lower Body Dressing: Sitting at sink Toileting: Dependent  Where Assessed-Toileting: Bedside Commode Toilet Transfer: Maximal verbal cueing, Dependent Toilet Transfer Method: Stand pivot Toilet Transfer Equipment: Bedside commode Tub/Shower Transfer: Unable to assess Gaffer Transfer: Unable to assess  Therapy/Group: Individual Therapy  Mullins 06/20/2020, 11:27 AM

## 2020-06-20 NOTE — Progress Notes (Signed)
Occupational Therapy Session Note  Patient Details  Name: Ann Coleman MRN: [id number] Date of Birth: Jun 05, 1924  Today's Date: 06/20/2020 OT Individual Time: 9767-3419 OT Individual Time Calculation (min): 71 min    Short Term Goals: Week 3:   STGs=LTGs  Skilled Therapeutic Interventions/Progress Updates:    Pt asleep in bed with dtr at bedside and interpretor present to assist with communication. No c/o pain, drowsy but agreeable to OT session.  Pt requesting to use bathroom.  Caregiver training completed with dtr to facilitate increased independence during functional transfers and toileting.  Pt completed supine to sit with min assist.  Squat pivot transfer EOB to w/c completed by dtr with CGA provided by OT.  Pt transported to bathroom by dtr.  Educated dtr on w/c mgt strategies to lock brakes and lift right arm rest.  Stand pivot transfer w/c <>toilet completed with assist from pts dtr with OT providing min assist to ensure safety.  Pt able to lean anteriorly and complete perihygiene with max assist.   Neuro re-ed to RUE completed in small gym to include UBE with right hand supported by ACE wrap forward and backward propulsion, no resistance x 2 mins each way. Also shoulder FF, ABd, horizontal abduction AAROM against gravity.  Facilitation of function elbow flexion by providing plastic cup for pt to hold and bring to mouth.  Pt with improved quality of movement when provided functional goal.  FA pro/sup completed holding 1 lb free weight with hand over hand to prevent dropping.  Wrist AROM with TCs to facilitate improved quality of movement and VCs to increase attention towards task. All neuro re-ed and therex completed 2 x 10 reps.  Pt transported to room and respiratory therapist with pt at OT departure.    Therapy Documentation Precautions:  Precautions Precautions: Fall Precaution Comments: R hemiplegia Restrictions Weight Bearing Restrictions: No   Therapy/Group: Individual  Therapy  Amie Critchley 06/20/2020, 9:57 AM

## 2020-06-20 NOTE — Progress Notes (Addendum)
[facility] PHYSICAL MEDICINE & REHABILITATION PROGRESS NOTE   Subjective/Complaints: Patient seen sitting up in bed this morning.  She indicates she did not sleep well overnight.  No reported issues overnight.  No interpreter available.  ROS: Limited due to language.  Objective:   DG Chest 2 View  Result Date: 06/19/2020 CLINICAL DATA:  Shortness of breath. EXAM: CHEST - 2 VIEW COMPARISON:  03/04/2017. FINDINGS: Mediastinum and hilar structures normal. Heart size stable. No pulmonary venous congestion. Mild bibasilar atelectasis/infiltrates. Small right pleural effusion. No pneumothorax. Surgical clips right upper quadrant. IMPRESSION: Low lung volumes with mild bibasilar atelectasis/infiltrates. Small right pleural effusion. Electronically Signed   By: Maisie Fus  Register   On: 06/19/2020 14:31   No results for input(s): WBC, HGB, HCT, PLT in the last 72 hours. Recent Labs    06/18/20 1243  NA 140  K 3.9  CL 106  CO2 24  GLUCOSE 172*  BUN 11  CREATININE 0.83  CALCIUM 9.0    Intake/Output Summary (Last 24 hours) at 06/20/2020 0917 Last data filed at 06/20/2020 0841 Gross per 24 hour  Intake 340 ml  Output --  Net 340 ml     Physical Exam: Vital Signs Blood pressure (!) 142/68, pulse 72, temperature 98.9 F (37.2 C), resp. rate 18, SpO2 95 %.  Constitutional: No distress . Vital signs reviewed. HENT: Normocephalic.  Atraumatic. Eyes: EOMI. No discharge. Cardiovascular: No JVD.  RRR. Respiratory: Normal effort.  No stridor.  Mild expiratory wheezes. GI: Non-distended.  BS +. Skin: Warm and dry.  Intact. Psych: Normal mood.  Normal behavior. Musc: No edema in extremities.  No tenderness in extremities. Neuro: Alert Motor: RUE: Shoulder abduction 2/5, elbow flexion/extension 2 -/5, distally 2+/5, appears unchanged Right lower extremity: Hip flexion 2/5, Knee extension 2/5, distally 0/5, appears unchanged  Assessment/Plan: 1. Functional deficits secondary to Left pontine  infarct  which require 3+ hours per day of interdisciplinary therapy in a comprehensive inpatient rehab setting.  Physiatrist is providing close team supervision and 24 hour management of active medical problems listed below.  Physiatrist and rehab team continue to assess barriers to discharge/monitor patient progress toward functional and medical goals  Care Tool:  Bathing    Body parts bathed by patient: Abdomen, Chest, Right arm, Face, Right upper leg, Left upper leg, Front perineal area   Body parts bathed by helper: Left arm, Left lower leg, Right lower leg Body parts n/a: Buttocks   Bathing assist Assist Level: Moderate Assistance - Patient 50 - 74%     Upper Body Dressing/Undressing Upper body dressing   What is the patient wearing?: Pull over shirt    Upper body assist Assist Level: Minimal Assistance - Patient > 75%    Lower Body Dressing/Undressing Lower body dressing      What is the patient wearing?: Incontinence brief, Skirt     Lower body assist Assist for lower body dressing: Total Assistance - Patient < 25%     Toileting Toileting    Toileting assist Assist for toileting: Maximal Assistance - Patient 25 - 49%     Transfers Chair/bed transfer  Transfers assist     Chair/bed transfer assist level: Moderate Assistance - Patient 50 - 74%     Locomotion Ambulation   Ambulation assist   Ambulation activity did not occur: Safety/medical concerns          Walk 10 feet activity   Assist  Walk 10 feet activity did not occur: Safety/medical concerns  Walk 50 feet activity   Assist Walk 50 feet with 2 turns activity did not occur: Safety/medical concerns         Walk 150 feet activity   Assist Walk 150 feet activity did not occur: Safety/medical concerns         Walk 10 feet on uneven surface  activity   Assist Walk 10 feet on uneven surfaces activity did not occur: Safety/medical concerns          Wheelchair     Assist Will patient use wheelchair at discharge?: Yes Type of Wheelchair: Manual Wheelchair activity did not occur: Safety/medical concerns (decreased strength/activity tolerance)  Wheelchair assist level: Dependent - Patient 0%      Wheelchair 50 feet with 2 turns activity    Assist    Wheelchair 50 feet with 2 turns activity did not occur: Safety/medical concerns       Wheelchair 150 feet activity     Assist  Wheelchair 150 feet activity did not occur: Safety/medical concerns   Assist Level: Dependent - Patient 0%   Blood pressure (!) 142/68, pulse 72, temperature 98.9 F (37.2 C), resp. rate 18, SpO2 95 %.    Medical Problem List and Plan: 1.Right side hemiparesis and slurred speech with confusionsecondary to Acute left pontine infarction  Continue CIR  WHO/PRAFO qhs  Team conference today to discuss current and goals and coordination of care, home and environmental barriers, and discharge planning with nursing, case manager, and therapies. Please see conference note from today as well.  2. Antithrombotics: -DVT/anticoagulation:Lovenox  CBC within normal limits on 7/16, labs ordered for tomorrow -antiplatelet therapy: Aspirin 81 mg daily and Plavix 75 mg daily x3 weeks then aspirin alone 3. Pain Management:Tylenol as needed, has Musculoskeletal CP (work-up has been negative for cardiogenic source), sportscreme  Voltaren gel ordered to bilateral knees, ?efficacy  Lidoderm patch ordered on 7/15  Appears controlled on 7/21 4. Mood:Provide emotional support -antipsychotic agents: N/A 5. Neuropsych: This patientisnot fully capable of making decisions on herown behalf. 6. Skin/Wound Care:Routine skin checks 7. Fluids/Electrolytes/Nutrition:Routine in and outs. 8. Dysphagia.   Advanced to D#3 thins  Encourage P.o. intake  Follow-up speech therapy 9. Hyperlipidemia. Lipitor 10.  Labile blood  pressure  Lisinopril increased to 5, increased to 10 on 7/20  Mildly elevated on 7/21 Vitals:   06/19/20 2021 06/20/20 0555  BP: (!) 132/53 (!) 142/68  Pulse: 66 72  Resp: 17 18  Temp: 98.8 F (37.1 C) 98.9 F (37.2 C)  SpO2: 92% 95%  11.  CKD stage II  GFR > 60 on 7/19  Continue to encourage fluids  Continue to monitor 12.  Wheezing with orthopnea  DuoNebs ? D/ced   CXR reviewed, showing low lung volumes with mild bibasilar atelectasis/infiltrates with small right pleural effusion.   Encourge IS  Lasix x1 ordered  13.  Prediabetes  Blood glucose elevated on 7/19  Will plan to transition to carb modified diet once on regular textures 14.  Grade 1 diastolic dysfunction  Echo reviewed from 05/2020  Will consider diuretics 15. Morbid obesity  Encourage weight loss  LOS: 18 days A FACE TO FACE EVALUATION WAS PERFORMED  Jerol Rufener Karis Juba 06/20/2020, 9:17 AM

## 2020-06-21 ENCOUNTER — Encounter (HOSPITAL_COMMUNITY): Payer: Medicare Other | Admitting: Occupational Therapy

## 2020-06-21 ENCOUNTER — Inpatient Hospital Stay (HOSPITAL_COMMUNITY): Payer: Medicare Other | Admitting: Physical Therapy

## 2020-06-21 ENCOUNTER — Ambulatory Visit (HOSPITAL_COMMUNITY): Payer: Medicare Other | Admitting: Physical Therapy

## 2020-06-21 ENCOUNTER — Inpatient Hospital Stay (HOSPITAL_COMMUNITY): Payer: Medicare Other | Admitting: Occupational Therapy

## 2020-06-21 LAB — CBC WITH DIFFERENTIAL/PLATELET
Abs Immature Granulocytes: 0.01 10*3/uL (ref 0.00–0.07)
Basophils Absolute: 0 10*3/uL (ref 0.0–0.1)
Basophils Relative: 1 %
Eosinophils Absolute: 0.2 10*3/uL (ref 0.0–0.5)
Eosinophils Relative: 4 %
HCT: 39.8 % (ref 36.0–46.0)
Hemoglobin: 12.4 g/dL (ref 12.0–15.0)
Immature Granulocytes: 0 %
Lymphocytes Relative: 40 %
Lymphs Abs: 2.1 10*3/uL (ref 0.7–4.0)
MCH: 25.6 pg — ABNORMAL LOW (ref 26.0–34.0)
MCHC: 31.2 g/dL (ref 30.0–36.0)
MCV: 82.1 fL (ref 80.0–100.0)
Monocytes Absolute: 0.6 10*3/uL (ref 0.1–1.0)
Monocytes Relative: 13 %
Neutro Abs: 2.1 10*3/uL (ref 1.7–7.7)
Neutrophils Relative %: 42 %
Platelets: 270 10*3/uL (ref 150–400)
RBC: 4.85 MIL/uL (ref 3.87–5.11)
RDW: 13.5 % (ref 11.5–15.5)
WBC: 5.1 10*3/uL (ref 4.0–10.5)
nRBC: 0 % (ref 0.0–0.2)

## 2020-06-21 MED ORDER — ACETAMINOPHEN 325 MG PO TABS: 650.0000 mg | ORAL_TABLET | Freq: Four times a day (QID) | ORAL | Status: DC

## 2020-06-21 MED ORDER — LISINOPRIL 10 MG PO TABS: 10.0000 mg | ORAL_TABLET | Freq: Every day | ORAL | 0 refills | Status: DC

## 2020-06-21 MED ORDER — DICLOFENAC SODIUM 1 % EX GEL: 2.0000 g | Freq: Four times a day (QID) | CUTANEOUS | 0 refills | Status: DC

## 2020-06-21 MED ORDER — IPRATROPIUM-ALBUTEROL 0.5-2.5 (3) MG/3ML IN SOLN
3.0000 mL | Freq: Two times a day (BID) | RESPIRATORY_TRACT | Status: DC
  Administered 2020-06-21 – 2020-06-22 (×2): 3 mL via RESPIRATORY_TRACT
  Filled 2020-06-21 (×2): qty 3

## 2020-06-21 MED ORDER — ATORVASTATIN CALCIUM 40 MG PO TABS: 40.0000 mg | ORAL_TABLET | Freq: Every day | ORAL | 0 refills | Status: DC

## 2020-06-21 MED ORDER — LIDOCAINE 5 % EX PTCH: 2.0000 | MEDICATED_PATCH | Freq: Every day | CUTANEOUS | 0 refills | Status: DC

## 2020-06-21 NOTE — Progress Notes (Signed)
Occupational Therapy Session Note  Patient Details  Name: Ann Coleman MRN: [id number] Date of Birth: [birth year]  Today's Date: 06/21/2020 OT Individual Time: 1345-1415 OT Individual Time Calculation (min): 30 min    Short Term Goals: Week 2:  OT Short Term Goal 1 (Week 2): Pt will rise to stand to with min A to prep for LB dressing. OT Short Term Goal 1 - Progress (Week 2): Not progressing OT Short Term Goal 2 (Week 2): Pt will don UB clothing with verbal cues. OT Short Term Goal 2 - Progress (Week 2): Progressing toward goal OT Short Term Goal 3 (Week 2): Pt will complete toilet transfers with mod A. OT Short Term Goal 3 - Progress (Week 2): Met OT Short Term Goal 4 (Week 2): Pt will don pants over feet with mod A. OT Short Term Goal 4 - Progress (Week 2): Not progressing Week 3:  OT Short Term Goal 1 (Week 3): STGs=LTGs  Skilled Therapeutic Interventions/Progress Updates:    Patient finishing up PT session, seated in w/c, daughter and interpreter present for session.  Daughter states that she feels comfortable with all patient needs, no further questions for OT, offered to demonstrate additional UB therapeutic activities - daughter declined to participate but patient engaged in towel slides, manipulation of objects, posture and reaching activities.  SPT w/c to bed with min A at close of session, sit to supine with mod A.  Bed alarm set and call bell in reach.    Therapy Documentation Precautions:  Precautions Precautions: Fall Precaution Comments: R hemiplegia Restrictions Weight Bearing Restrictions: No   Therapy/Group: Individual Therapy  Carlos Levering 06/21/2020, 7:42 AM

## 2020-06-21 NOTE — Progress Notes (Signed)
Physical Therapy Discharge Summary  Patient Details  Name: Ann Coleman MRN: [id number] Date of Birth: 22-Apr-1924  Today's Date: 06/21/2020 PT Individual Time: 2951-8841 [id number] PT Individual Time Calculation (min): 53 min and 42 min    Patient has met 3 of 4 long term goals due to improved activity tolerance, improved balance, improved postural control, increased strength, decreased pain, ability to compensate for deficits, functional use of  right upper extremity and right lower extremity, improved attention, improved awareness and improved coordination.  Patient to discharge at a wheelchair level [place].   Patient's care partner attended hands-on education/training and is independent to provide the necessary physical and cognitive assistance at discharge.  Reasons goals not met: Patient continues to require min assist for dynamic sitting balance.  Recommendation:  Patient will benefit from ongoing skilled PT services in home health setting to continue to advance safe functional mobility, address ongoing impairments in R hemiparesis, R attention, trunk control, standing balance, transfer training, gait training, and minimize fall risk.  Equipment: custom wheelchair and hospital bed  Reasons for discharge: treatment goals met and discharge from hospital  Patient/family agrees with progress made and goals achieved: Yes  Skilled Therapeutic Interventions/Progress Updates:  Session 1: Pt received sitting in w/c with in-person interpreter, Sokkun, present throughout session. Pt agreeable to therapy session but reports not resting well last night causing her to be tired today. Pt reports need to use bathroom. R stand pivot w/c>toilet using grab bars for UE support with mod assist for lifting/pivoting hips and assist for R LE positioning and facilitating knee extension. Sit<>stand to/from toilet using grab bars with mod assist then min/mod assist for balance while therapist performed total assist  LB clothing management. Continent of bladder and performed seated peri-care with set-up assist. L stand/squat pivot to w/c with mod assist for lifting/pivoting hips and manually facilitating R LE positioning. Sit<>stand w/c<>grab bar with min/mod assist for lifting/balance while therapist provided total assist to reposition LB clothing. Seated hand hygiene at sink with mod assist for time management.  Transported to/from gym in w/c for time management and energy conservation. Simulated car transfer (sedan height) via stand/squat pivot and pt using L hand support on bar simulating car handle with mod assist for lifting/pivoting hips - mod assist to place R LE in/out vehicle. Transported back to room. Block practice squat/stand pivot w/c<>EOB x2 reps with primarily mod assist for pivoting hips and manual facilitation for R LE positioning for knee extension. Sit<>stand to/from EOB x2 with L UE support on bedrail and min assist for lifting/balance -cuing for increased trunk/hip extension for upright posture once in standing and manual facilitation for R knee extension - able to tolerate standing ~10seconds but then spontaneously starts to return to sitting. Sit>supine min assist for R LE positioning into bed using bedrail support. In supine, with assist for R LE positioning into hooklying performed 2x10 reps of bridging with manual facilitation for R knee control. Pt left supine in bed with needs in reach, HOB elevated, and bed alarm on.  Noticed R ankle tending to roll towards inversion with transfers - discussed with PA about getting an air cast to wear to protect this joint when doing transfers with family.   Session 2: Pt received supine in bed with her daughter present and the in-person interpreter, Sokkun, present for entire session. Pt agreeable to therapy session and pt's daughter present for continued hands-on training. Educated on importance of wearing R LE PRAFO at night due to continued paresis. Educated  on recommendation for wearing R LE air cast in tennis shoes to protect ankle joint during transfers due to the ankle starting to roll in - delivered during session. Pt's daughter assisted pt with all mobility this session requiring min progressed to no cuing from therapist. Supine>sitting L EOB min assist for trunk upright. R squat pivot EOB>w/c mod assist - pt's daughter demonstrating safe technique to set-up transfer and assist patient without physical assist from therapist - cuing for positioning R LE prior to initiating transfer.  Transported to/from gym in w/c for time management and energy conservation. Simulated car transfer 2x (sedan height) including simulating use of grab bar handle in car with pt's daughter providing mod assist for lifting/pivoting hips and therapist providing CGA for guarding/safety while pivoting and min assist for stabilizing w/c - continued reinforcement for R LE positioning prior to starting transfer - pt's daughter has to provide mod assist for R LE management in/out of vehicle. Educated on need for +2 assist during car transfer to ensure pt's hips rotate fully to make it safely onto the car seat or w/c seat as well as to help stabilize wheelchair - pt's daughter verbalized understanding and reports she will have help at home. Educated on proper technique for bumping pt up/down 1 step in w/c for home entry - therapist provided demonstration and education. Pt's daughter performed same task with CGA/min assist from therapist at front of wheelchair to stabilize - educated on need for 2nd person assistance to safely perform this at home. Discussed best recommendation to place ramp and daughter in agreement but reports will have several strong males to assist with this tomorrow at discharge until a ramp can be installed. Provided patient with family assisted HEP printout including bridging and R LE heel slides with written instructions on how family can safely assist pt with R LE  management during exercises in bed. Pt's daughter reports no questions/concerns regarding training and demonstrated excellent understanding. Pt left seated in w/c as hand-off to [place], [place].   PT Discharge Precautions/Restrictions Precautions Precautions: Fall;Other (comment) Precaution Comments: R hemiplegia Restrictions Weight Bearing Restrictions: No Pain Pain Assessment Pain Scale: Faces Faces Pain Scale: Hurts little more (reports some back discomfort - unrated) Pain Type: Chronic pain Pain Location: Back Pain Orientation: Medial Pain Descriptors / Indicators: Aching Pain Onset: On-going Pain Intervention(s): Rest;Repositioned Perception  Perception Perception: Impaired Inattention/Neglect: Other (comment) (improved but still with moderate R inattention) Praxis Praxis: Impaired Praxis Impairment Details: Initiation;Motor planning  Cognition Overall Cognitive Status: History of cognitive impairments - at baseline Arousal/Alertness: Awake/alert Orientation Level: Oriented to person;Oriented to situation Attention: Sustained Focused Attention: Appears intact Sustained Attention: Impaired Awareness: Impaired Problem Solving: Impaired Safety/Judgment: Impaired Sensation Sensation Light Touch: Impaired Detail Central sensation comments: difficult to determine as pt has difficulty following directions but pt reports R LE feels numb compared to L but able to feel light touch on screen Hot/Cold: Not tested Proprioception: Impaired Detail Proprioception Impaired Details: Impaired RLE;Impaired RUE Stereognosis: Not tested Coordination Gross Motor Movements are Fluid and Coordinated: No Fine Motor Movements are Fluid and Coordinated: No Coordination and Movement Description: continues with R hemiplegia and R inattention Finger Nose Finger Test: Overshooting, uncoordinated with L hand, unable to reach R hand Heel Shin Test: unable to perform with R LE due to paresis Motor   Motor Motor: Hemiplegia;Abnormal postural alignment and control;Abnormal tone Motor - Discharge Observations: continues with R hemiparesis, R inattention, and impaired trunk control  Mobility Bed Mobility Bed Mobility: Supine to Sit;Sit  to Supine Supine to Sit: Minimal Assistance - Patient > 75% Sit to Supine: Minimal Assistance - Patient > 75% Transfers Transfers: Sit to Stand;Stand to Sit;Squat Pivot Transfers Sit to Stand: Minimal Assistance - Patient > 75% (with UE support) Stand to Sit: Minimal Assistance - Patient > 75% (with UE support) Squat Pivot Transfers: Moderate Assistance - Patient 50-74% Transfer (Assistive device): Other (Comment) (// bars or grab bar or bedrail) Locomotion  Gait Ambulation: No Gait Gait: No Stairs / Additional Locomotion Stairs: No Wheelchair Mobility Wheelchair Mobility: No (not a functional wheelchair propeller)  Trunk/Postural Assessment  Cervical Assessment Cervical Assessment: Exceptions to [facility] (forward head) Thoracic Assessment Thoracic Assessment: Exceptions to [facility] (kyphotic posture) Lumbar Assessment Lumbar Assessment: Exceptions to [facility] (posterior pelvic tilt) Postural Control Postural Control: Deficits on evaluation Righting Reactions: delayed Protective Responses: delayed  Balance Balance Balance Assessed: Yes Static Sitting Balance Static Sitting - Level of Assistance: 5: Stand by assistance Dynamic Sitting Balance Dynamic Sitting - Level of Assistance: 4: Min Insurance risk surveyor Standing - Balance Support: Left upper extremity supported Static Standing - Level of Assistance: 4: Min assist Dynamic Standing Balance Dynamic Standing - Balance Support: Left upper extremity supported Dynamic Standing - Level of Assistance: 2: Max assist Extremity Assessment      RLE Assessment RLE Assessment: Exceptions to [facility] Passive Range of Motion (PROM) Comments: WNL/WFL in hip, knee, and ankle General Strength  Comments: below strength scores assessed in sitting RLE Strength Right Hip Flexion: 2+/5 Right Knee Flexion: 1/5 Right Knee Extension: 3-/5 Right Ankle Dorsiflexion: 1/5 (during testing no trace movement felt but when pt functionally attempting to move LE felt possible trace activation) Right Ankle Plantar Flexion: 1/5 LLE Assessment LLE Assessment: Exceptions to [facility] Passive Range of Motion (PROM) Comments: [facility] General Strength Comments: below strength scores assessed in sitting and during functional tasks LLE Strength Left Hip Flexion: 3+/5 Left Knee Flexion: 4-/5 Left Knee Extension: 4/5 Left Ankle Dorsiflexion: 4-/5 Left Ankle Plantar Flexion: 4-/5    Tawana Scale , PT, DPT, CSRS  06/21/2020, 8:01 AM

## 2020-06-21 NOTE — Progress Notes (Signed)
Occupational Therapy Discharge Summary  Patient Details  Name: Ann Coleman MRN: [id number] Date of Birth: Aug 23, 1924     Patient has met 31 of 60 long term goals due to improved activity tolerance, improved balance, postural control, ability to compensate for deficits, functional use of  RIGHT upper and RIGHT lower extremity, improved attention, improved awareness and improved coordination.    Patient to discharge at overall Mod Assist level with transfers and mod-max LB self care.  Patient's care partner is independent to provide the necessary physical and cognitive assistance at discharge.    Reasons goals not met: n/a  Recommendation:  Patient will benefit from ongoing skilled OT services in home health setting to continue to advance functional skills in the area of BADL.  Equipment: transfer tub bench  Reasons for discharge: treatment goals met  Patient/family agrees with progress made and goals achieved: Yes  OT Discharge Precautions/Restrictions  Precautions Precautions: Fall Precaution Comments: R hemiplegia ADL ADL Eating: Set up Where Assessed-Eating: Wheelchair Grooming: Supervision/safety Where Assessed-Grooming: Wheelchair Upper Body Bathing: Moderate cueing, Minimal assistance Where Assessed-Upper Body Bathing: Sitting at sink Lower Body Bathing: Moderate assistance Where Assessed-Lower Body Bathing: Sitting at sink Upper Body Dressing: Moderate assistance Where Assessed-Upper Body Dressing: Wheelchair Lower Body Dressing: Maximal assistance Where Assessed-Lower Body Dressing: Wheelchair Toileting: Maximal assistance Where Assessed-Toileting: Glass blower/designer: Moderate assistance Toilet Transfer Method: Stand pivot Toilet Transfer Equipment: Energy manager: Moderate assistance Tub/Shower Transfer Method: Squat pivot Tub/Shower Equipment: Facilities manager: Unable to assess Vision Baseline Vision/History: No  visual deficits Patient Visual Report: No change from baseline Tracking/Visual Pursuits: Decreased smoothness of horizontal tracking;Decreased smoothness of vertical tracking Visual Fields: Right visual field deficit (45 degree field cut) Perception  Inattention/Neglect: Does not attend to right side of body;Does not attend to right visual field Praxis Praxis: Impaired Praxis Impairment Details: Initiation;Motor planning Cognition Overall Cognitive Status: History of cognitive impairments - at baseline Focused Attention: Appears intact (very short attention span) Sustained Attention: Impaired Memory: Impaired Memory Impairment: Retrieval deficit;Decreased short term memory Problem Solving: Impaired Problem Solving Impairment: Functional basic Sensation Sensation Light Touch: Appears Intact Hot/Cold: Appears Intact Proprioception: Impaired Detail Proprioception Impaired Details: Impaired RLE;Impaired RUE Stereognosis: Impaired by gross assessment Coordination Gross Motor Movements are Fluid and Coordinated: No Fine Motor Movements are Fluid and Coordinated: No Coordination and Movement Description: R hemiplegia and R inattention limiting all mobility Finger Nose Finger Test: Overshooting, uncoordinated with L hand, unable to reach R hand Motor  Motor Motor: Hemiplegia;Abnormal postural alignment and control;Abnormal tone Motor - Discharge Observations: fair improvement in RUE and RLE to enable pt to transfer with stand pivot with mod A Mobility  Bed Mobility Rolling Right: Minimal Assistance - Patient > 75% Rolling Left: Minimal Assistance - Patient > 75% Supine to Sit: Moderate Assistance - Patient 50-74% Sit to Supine: Moderate Assistance - Patient 50-74% Transfers Sit to Stand: Minimal Assistance - Patient > 75% Stand to Sit: Minimal Assistance - Patient > 75%  Trunk/Postural Assessment  Thoracic Assessment Thoracic Assessment: Exceptions to [facility] (kyphotic  posture) Lumbar Assessment Lumbar Assessment: Exceptions to [facility] (posterior pelvic tilt) Postural Control Righting Reactions: delayed Protective Responses: delayed  Balance Static Sitting Balance Static Sitting - Level of Assistance: 5: Stand by assistance Dynamic Sitting Balance Dynamic Sitting - Level of Assistance: 4: Min assist Static Standing Balance Static Standing - Level of Assistance: 4: Min assist Dynamic Standing Balance Dynamic Standing - Level of Assistance: 2: Max assist Extremity/Trunk Assessment RUE Assessment  Active Range of Motion (AROM) Comments: improved AROM from admission Brunstrum levels for arm and hand: Arm;Hand Brunstrum level for arm: Stage III Synergy is performed voluntarily Brunstrum level for hand: Stage III Synergies performed voluntarily RUE AROM (degrees) Right Shoulder Flexion: 45 Degrees Right Elbow Flexion: 90 Right Wrist Flexion: 20 Degrees LUE Assessment General Strength Comments: 4/5   Nussen Pullin 06/21/2020, 12:18 PM

## 2020-06-21 NOTE — Progress Notes (Signed)
Pt refused 6pm tylenol due to too many people giving her meds and she isnt hurting and they upset her stomach. Stratus Translator used to communicate with pt.   Ross Ludwig, RN

## 2020-06-21 NOTE — Progress Notes (Signed)
Occupational Therapy Session Note  Patient Details  Name: Ann Coleman MRN: [id number] Date of Birth: 1924/07/03  Today's Date: 06/21/2020 OT Individual Time: 0907-1005 OT Individual Time Calculation (min): 58 min    Short Term Goals: Week 3:  OT Short Term Goal 1 (Week 3): STGs=LTGs  Skilled Therapeutic Interventions/Progress Updates:    Pt received sleeping in bed with interpretor present.  Pt stating she was very tired and not wanting to participate.  Pt did not have any clean clothing to put on today and she declined doffing the clothes she put on yesterday to bathe today.  It took quite some time to get patient to participate.   Pt finally agreed to get up to focus on RUE exercises.  Pt sat to EOB with mod A and then completed stand pivot with mod A to w/c.  Pt set up at sink to brush her teeth and wash her face.  Pt taken to gym to sit at high low table to work on R shoulder and elbow A/AROM using [employer].  FMC placing magnets together and picking up in palm for in hand manipulation with min A.  Shoulder A/arom with B hands on hula hoop and rotating hoop for increased shoulder flexion.   Reassessed visual fields and RUE for her discharge.   Pt taken back to room and resting in room with interpretor present.   Therapy Documentation Precautions:  Precautions Precautions: Fall Precaution Comments: R hemiplegia Restrictions Weight Bearing Restrictions: No    Vital Signs: Oxygen Therapy SpO2: 91 % O2 Device: Room Air Pain: Pain Assessment Pain Scale: 0-10 Pain Score: 0-No pain ADL: ADL Eating: Set up Where Assessed-Eating: Wheelchair Grooming: Supervision/safety Where Assessed-Grooming: Wheelchair Upper Body Bathing: Moderate cueing, Minimal assistance Where Assessed-Upper Body Bathing: Sitting at sink Lower Body Bathing: Moderate assistance Where Assessed-Lower Body Bathing: Sitting at sink Upper Body Dressing: Moderate assistance Where Assessed-Upper Body  Dressing: Wheelchair Lower Body Dressing: Maximal assistance Where Assessed-Lower Body Dressing: Wheelchair Toileting: Maximal assistance Where Assessed-Toileting: Teacher, adult education: Moderate assistance Toilet Transfer Method: Stand pivot Toilet Transfer Equipment: Engineer, technical sales: Moderate assistance Tub/Shower Transfer Method: Squat pivot Tub/Shower Equipment: Insurance underwriter: Unable to assess   Therapy/Group: Individual Therapy  Loxley Schmale 06/21/2020, 11:48 AM

## 2020-06-21 NOTE — Plan of Care (Signed)
°  Problem: RH SAFETY Goal: RH STG ADHERE TO SAFETY PRECAUTIONS W/ASSISTANCE/DEVICE Description: STG Adhere to Safety Precautions With  cues/reminders Assistance/Device. Outcome: Progressing Goal: RH STG DECREASED RISK OF FALL WITH ASSISTANCE Description: STG Decreased Risk of Fall With  cues/reminders Assistance. Outcome: Progressing

## 2020-06-21 NOTE — Progress Notes (Signed)
Orthopedic Tech Progress Note Patient Details:  Leaha Doland Sep 27, 1924 [id number]  Ortho Devices Type of Ortho Device: Ankle Air splint Ortho Device/Splint Location: Lower Right Extremity Ortho Device/Splint Interventions: Ordered, Application   Post Interventions Patient Tolerated: Well Instructions Provided: Adjustment of device, Care of device, Poper ambulation with device  Air Cast applied by rehab staff [name] 06/21/2020, 1:32 PM

## 2020-06-21 NOTE — Progress Notes (Signed)
[facility] [facility] PHYSICAL MEDICINE & REHABILITATION PROGRESS NOTE   Subjective/Complaints: Patient seen sitting up in bed this morning eating breakfast.  She indicates did not sleep well overnight.  No interpreter available.  No reported issues overnight.  Per therapies, she is progressing.  ROS: Limited due to language.  Objective:   DG Chest 2 View  Result Date: 06/19/2020 CLINICAL DATA:  Shortness of breath. EXAM: CHEST - 2 VIEW COMPARISON:  03/04/2017. FINDINGS: Mediastinum and hilar structures normal. Heart size stable. No pulmonary venous congestion. Mild bibasilar atelectasis/infiltrates. Small right pleural effusion. No pneumothorax. Surgical clips right upper quadrant. IMPRESSION: Low lung volumes with mild bibasilar atelectasis/infiltrates. Small right pleural effusion. Electronically Signed   By: Maisie Fus  Register   On: 06/19/2020 14:31   Recent Labs    06/21/20 0546  WBC 5.1  HGB 12.4  HCT 39.8  PLT 270   Recent Labs    06/18/20 1243  NA 140  K 3.9  CL 106  CO2 24  GLUCOSE 172*  BUN 11  CREATININE 0.83  CALCIUM 9.0    Intake/Output Summary (Last 24 hours) at 06/21/2020 0947 Last data filed at 06/21/2020 0921 Gross per 24 hour  Intake 474 ml  Output --  Net 474 ml     Physical Exam: Vital Signs Blood pressure 128/70, pulse 75, temperature 98.2 F (36.8 C), resp. rate 17, SpO2 91 %.  Constitutional: No distress . Vital signs reviewed. HENT: Normocephalic.  Atraumatic. Eyes: EOMI. No discharge. Cardiovascular: No JVD. Respiratory: Normal effort.  No stridor. GI: Non-distended. Skin: Warm and dry.  Intact. Psych: Appears to have normal mood.  Normal behavior. Musc: No edema in extremities.  No tenderness in extremities. Neuro: Alert Motor: RUE: Shoulder abduction 2/5, elbow flexion/extension 2 -/5, distally 2+/5, appears improved Right lower extremity: Hip flexion 2/5, Knee extension 2/5, distally 2+/5  Assessment/Plan: 1. Functional deficits secondary to  Left pontine infarct  which require 3+ hours per day of interdisciplinary therapy in a comprehensive inpatient rehab setting.  Physiatrist is providing close team supervision and 24 hour management of active medical problems listed below.  Physiatrist and rehab team continue to assess barriers to discharge/monitor patient progress toward functional and medical goals  Care Tool:  Bathing    Body parts bathed by patient: Abdomen, Chest, Right arm, Face, Right upper leg, Left upper leg, Front perineal area   Body parts bathed by helper: Left arm, Left lower leg, Right lower leg, Buttocks Body parts n/a: Buttocks   Bathing assist Assist Level: Moderate Assistance - Patient 50 - 74%     Upper Body Dressing/Undressing Upper body dressing   What is the patient wearing?: Button up shirt    Upper body assist Assist Level: Moderate Assistance - Patient 50 - 74%    Lower Body Dressing/Undressing Lower body dressing      What is the patient wearing?: Incontinence brief, Skirt     Lower body assist Assist for lower body dressing: Total Assistance - Patient < 25%     Toileting Toileting    Toileting assist Assist for toileting: Maximal Assistance - Patient 25 - 49%     Transfers Chair/bed transfer  Transfers assist     Chair/bed transfer assist level: Moderate Assistance - Patient 50 - 74%     Locomotion Ambulation   Ambulation assist   Ambulation activity did not occur: Safety/medical concerns          Walk 10 feet activity   Assist  Walk 10 feet activity did  not occur: Safety/medical concerns        Walk 50 feet activity   Assist Walk 50 feet with 2 turns activity did not occur: Safety/medical concerns         Walk 150 feet activity   Assist Walk 150 feet activity did not occur: Safety/medical concerns         Walk 10 feet on uneven surface  activity   Assist Walk 10 feet on uneven surfaces activity did not occur: Safety/medical  concerns         Wheelchair     Assist Will patient use wheelchair at discharge?: Yes Type of Wheelchair: Manual Wheelchair activity did not occur: Safety/medical concerns (decreased strength/activity tolerance)  Wheelchair assist level: Dependent - Patient 0%      Wheelchair 50 feet with 2 turns activity    Assist    Wheelchair 50 feet with 2 turns activity did not occur: Safety/medical concerns       Wheelchair 150 feet activity     Assist  Wheelchair 150 feet activity did not occur: Safety/medical concerns   Assist Level: Dependent - Patient 0%   Blood pressure 128/70, pulse 75, temperature 98.2 F (36.8 C), resp. rate 17, SpO2 91 %.    Medical Problem List and Plan: 1.Right side hemiparesis and slurred speech with confusionsecondary to Acute left pontine infarction  Continue CIR  WHO/PRAFO qhs 2. Antithrombotics: -DVT/anticoagulation:Lovenox  CBC within acceptable range on 7/22 -antiplatelet therapy: Aspirin 81 mg daily and Plavix 75 mg daily x3 weeks then aspirin alone 3. Pain Management:Tylenol as needed, has Musculoskeletal CP (work-up has been negative for cardiogenic source), sportscreme  Voltaren gel ordered to bilateral knees, ?efficacy  Lidoderm patch ordered on 7/15  Appears controlled on 7/22 4. Mood:Provide emotional support -antipsychotic agents: N/A 5. Neuropsych: This patientisnot fully capable of making decisions on herown behalf. 6. Skin/Wound Care:Routine skin checks 7. Fluids/Electrolytes/Nutrition:Routine in and outs. 8. Dysphagia.   Advanced to D#3 thins  Encourage P.o. intake  Follow-up speech therapy 9. Hyperlipidemia. Lipitor 10.  Labile blood pressure  Lisinopril increased to 5, increased to 10 on 7/20  Controlled on 7/22, orthostatics negative Vitals:   06/21/20 0500 06/21/20 0820  BP: 128/70   Pulse: 75   Resp: 17   Temp: 98.2 F (36.8 C)   SpO2: 90% 91%  11.  CKD stage  II  GFR > 60 on 7/19  Continue to encourage fluids  Continue to monitor 12.  Wheezing with orthopnea  DuoNebs ? D/ced   CXR reviewed, showing low lung volumes with mild bibasilar atelectasis/infiltrates with small right pleural effusion.   Encourge IS  Lasix x1 ordered on 7/21  Appears to be improving 13.  Prediabetes  Blood glucose elevated on 7/19  Continue to encourage carb modified diet at discharge 14.  Grade 1 diastolic dysfunction  Echo reviewed from 05/2020 15. Morbid obesity  Encourage weight loss  LOS: 19 days A FACE TO FACE EVALUATION WAS PERFORMED  Ansen Sayegh Karis Juba 06/21/2020, 9:47 AM

## 2020-06-21 NOTE — Progress Notes (Signed)
Patient ID: Ann Coleman, female   DOB: [birth year], 86 y.o.   MRN: [id number]  Sw informed [employer] is out of stock on the type of WC this patient needs so we are delivering one to the patient's home when we deliver the hospital bed

## 2020-06-22 NOTE — Progress Notes (Signed)
Discharge instructions and medications have been reviewed and explained.  Patient and her family member verbalizes understanding and have no questions at this time.

## 2020-06-22 NOTE — Progress Notes (Addendum)
[facility] PHYSICAL MEDICINE & REHABILITATION PROGRESS NOTE   Subjective/Complaints: Patient seen sitting up in bed this morning.  She indicates she slept well overnight.  Family at bedside with questions relating discharge.  She is ready for discharge today.  ROS: Limited due to language  Objective:   No results found. Recent Labs    06/21/20 0546  WBC 5.1  HGB 12.4  HCT 39.8  PLT 270   No results for input(s): NA, K, CL, CO2, GLUCOSE, BUN, CREATININE, CALCIUM in the last 72 hours.  Intake/Output Summary (Last 24 hours) at 06/22/2020 1222 Last data filed at 06/22/2020 0844 Gross per 24 hour  Intake 720 ml  Output --  Net 720 ml     Physical Exam: Vital Signs Blood pressure (!) 103/63, pulse 81, temperature (!) 97.4 F (36.3 C), temperature source Oral, resp. rate 16, SpO2 96 %.  Constitutional: No distress . Vital signs reviewed. HENT: Normocephalic.  Atraumatic. Eyes: EOMI. No discharge. Cardiovascular: No JVD.  RRR. Respiratory: Normal effort.  No stridor.  Bilateral clear to auscultation. GI: Non-distended.  BS +. Skin: Warm and dry.  Intact. Psych: Normal mood.  Normal behavior. Musc: No edema in extremities.  No tenderness in extremities. Neuro: Alert Motor: RUE: Shoulder abduction 2/5, elbow flexion/extension 2 -/5, distally 2+/5, appears stable Right lower extremity: Hip flexion 2/5, Knee extension 2/5, distally 2+/5, appears stable  Assessment/Plan: 1. Functional deficits secondary to Left pontine infarct  which require 3+ hours per day of interdisciplinary therapy in a comprehensive inpatient rehab setting.  Physiatrist is providing close team supervision and 24 hour management of active medical problems listed below.  Physiatrist and rehab team continue to assess barriers to discharge/monitor patient progress toward functional and medical goals  Care Tool:  Bathing    Body parts bathed by patient: Abdomen, Chest, Right arm, Face, Right upper leg,  Left upper leg, Front perineal area   Body parts bathed by helper: Left arm, Left lower leg, Right lower leg, Buttocks Body parts n/a: Buttocks   Bathing assist Assist Level: Moderate Assistance - Patient 50 - 74%     Upper Body Dressing/Undressing Upper body dressing   What is the patient wearing?: Button up shirt    Upper body assist Assist Level: Moderate Assistance - Patient 50 - 74%    Lower Body Dressing/Undressing Lower body dressing      What is the patient wearing?: Incontinence brief, Skirt     Lower body assist Assist for lower body dressing: Maximal Assistance - Patient 25 - 49%     Toileting Toileting    Toileting assist Assist for toileting: Maximal Assistance - Patient 25 - 49%     Transfers Chair/bed transfer  Transfers assist     Chair/bed transfer assist level: Moderate Assistance - Patient 50 - 74%     Locomotion Ambulation   Ambulation assist   Ambulation activity did not occur: Safety/medical concerns          Walk 10 feet activity   Assist  Walk 10 feet activity did not occur: Safety/medical concerns        Walk 50 feet activity   Assist Walk 50 feet with 2 turns activity did not occur: Safety/medical concerns         Walk 150 feet activity   Assist Walk 150 feet activity did not occur: Safety/medical concerns         Walk 10 feet on uneven surface  activity   Assist Walk 10 feet on  uneven surfaces activity did not occur: Safety/medical concerns         Wheelchair     Assist Will patient use wheelchair at discharge?: Yes (via dependent w/c propulsion as pt is not a functional propeller) Type of Wheelchair: Manual Wheelchair activity did not occur: Safety/medical concerns (decreased strength/activity tolerance)  Wheelchair assist level: Dependent - Patient 0%      Wheelchair 50 feet with 2 turns activity    Assist    Wheelchair 50 feet with 2 turns activity did not occur: Safety/medical  concerns   Assist Level: Dependent - Patient 0%   Wheelchair 150 feet activity     Assist  Wheelchair 150 feet activity did not occur: Safety/medical concerns   Assist Level: Dependent - Patient 0%   Blood pressure (!) 103/63, pulse 81, temperature (!) 97.4 F (36.3 C), temperature source Oral, resp. rate 16, SpO2 96 %.    Medical Problem List and Plan: 1.Right side hemiparesis and slurred speech with confusionsecondary to Acute left pontine infarction  DC today  Will see patient for transitional care management in 1-2 weeks post-discharge  Patient will benefit from wheelchair due to limited functional mobility, limitations related to right hemiparesis, trunk control, standing balance, and minimizing fall risk.  She will continue to need to work on transfer training, gait training.  WHO/PRAFO qhs 2. Antithrombotics: -DVT/anticoagulation:Lovenox  CBC within acceptable range on 7/22 -antiplatelet therapy: Aspirin 81 mg daily and Plavix 75 mg daily x3 weeks then aspirin alone 3. Pain Management:Tylenol as needed, has Musculoskeletal CP (work-up has been negative for cardiogenic source), sportscreme  Voltaren gel ordered to bilateral knees, ?efficacy  Lidoderm patch ordered on 7/15  Appears controlled on 7/23 4. Mood:Provide emotional support -antipsychotic agents: N/A 5. Neuropsych: This patientisnot fully capable of making decisions on herown behalf. 6. Skin/Wound Care:Routine skin checks 7. Fluids/Electrolytes/Nutrition:Routine in and outs. 8. Dysphagia.   Advanced to D#3 thins  Encourage P.o. intake  Follow-up speech therapy 9. Hyperlipidemia. Lipitor 10.  Labile blood pressure  Lisinopril increased to 5, increased to 10 on 7/20  Relatively controlled on 7/23 and?  Trending down, continue to monitor in outpatient setting consider decrease medications if necessary Vitals:   06/22/20 0515 06/22/20 0837  BP: (!) 103/63   Pulse: 81    Resp: 16   Temp: (!) 97.4 F (36.3 C)   SpO2: 94% 96%   11.  CKD stage II  GFR > 60 on 7/19  Continue to encourage fluids  Continue to monitor 12.  Wheezing with orthopnea  DuoNebs ? D/ced   CXR reviewed, showing low lung volumes with mild bibasilar atelectasis/infiltrates with small right pleural effusion.   Encourge IS  Lasix x1 ordered on 7/21  Appears to be improving  Consider further work-up as outpatient 13.  Prediabetes  Blood glucose elevated on 7/19  Continue to encourage carb modified diet at discharge  Continue to follow in ambulatory setting 14.  Grade 1 diastolic dysfunction  Echo reviewed from 05/2020 15. Morbid obesity  Encourage weight loss  > 30 minutes spent in total in discharge planning between myself and PA regarding aforementioned, as well discussion regarding DME equipment, follow-up appointments, follow-up therapies, discharge medications, discharge recommendations  LOS: 20 days A FACE TO FACE EVALUATION WAS PERFORMED  Ann Coleman Karis Juba 06/22/2020, 12:22 PM

## 2020-06-22 NOTE — Discharge Instructions (Signed)
Inpatient Rehab Discharge Instructions  Ann Coleman Discharge date and time: No discharge date for patient encounter.   Activities/Precautions/ Functional Status: Activity: activity as tolerated Diet:  Wound Care: none needed Functional status:  ___ No restrictions     ___ Walk up steps independently ___ 24/7 supervision/assistance   ___ Walk up steps with assistance ___ Intermittent supervision/assistance  ___ Bathe/dress independently ___ Walk with walker     _x__ Bathe/dress with assistance ___ Walk Independently    ___ Shower independently ___ Walk with assistance    ___ Shower with assistance ___ No alcohol     ___ Return to work/school ________ COMMUNITY REFERRALS UPON DISCHARGE:    Home Health:   PT    OT     SNA                   Agency: [facility] [employer] Phone: [phone]    Medical Equipment/Items Ordered: Tour manager, Hospital Bed, Wheelchair                                                 Agency/Supplier: [employer]                                                            [phone]  Special Instructions: SET UP A [facility] (NURSES WILL CALL TO SCHEDULE NEW PATIENT APPOINTMENT) No driving smoking or alcohol   STROKE/TIA DISCHARGE INSTRUCTIONS SMOKING Cigarette smoking nearly doubles your risk of having a stroke & is the single most alterable risk factor  If you smoke or have smoked in the last 12 months, you are advised to quit smoking for your health.  Most of the excess cardiovascular risk related to smoking disappears within a year of stopping.  Ask you doctor about anti-smoking medications  [place] Quit Line: 1-800-QUIT NOW  Free Smoking Cessation Classes [phone]  CHOLESTEROL Know your levels; limit fat & cholesterol in your diet  Lipid Panel     Component Value Date/Time   CHOL 204 (H) 05/30/2020 0425   TRIG 139 05/30/2020 0425   HDL 40 (L) 05/30/2020 0425   CHOLHDL 5.1 05/30/2020  0425   VLDL 28 05/30/2020 0425   LDLCALC 136 (H) 05/30/2020 0425      Many patients benefit from treatment even if their cholesterol is at goal.  Goal: Total Cholesterol (CHOL) less than 160  Goal:  Triglycerides (TRIG) less than 150  Goal:  HDL greater than 40  Goal:  LDL (LDLCALC) less than 100   BLOOD PRESSURE American Stroke Association blood pressure target is less that 120/80 mm/Hg  Your discharge blood pressure is:  BP: 133/67  Monitor your blood pressure  Limit your salt and alcohol intake  Many individuals will require more than one medication for high blood pressure  DIABETES (A1c is a blood sugar average for last 3 months) Goal HGBA1c is under 7% (HBGA1c is blood sugar average for last 3 months)  Diabetes: No known diagnosis of diabetes    Lab Results  Component Value Date   HGBA1C 6.1 (H) 05/30/2020     Your HGBA1c can be  lowered with medications, healthy diet, and exercise.  Check your blood sugar as directed by your physician  Call your physician if you experience unexplained or low blood sugars.  PHYSICAL ACTIVITY/REHABILITATION Goal is 30 minutes at least 4 days per week  Activity: Increase activity slowly, Therapies: Physical Therapy: Home Health Return to work:   Activity decreases your risk of heart attack and stroke and makes your heart stronger.  It helps control your weight and blood pressure; helps you relax and can improve your mood.  Participate in a regular exercise program.  Talk with your doctor about the best form of exercise for you (dancing, walking, swimming, cycling).  DIET/WEIGHT Goal is to maintain a healthy weight  Your discharge diet is:  Diet Order            DIET DYS 2 Room service appropriate? Yes; Fluid consistency: Nectar Thick  Diet effective now                 liquids Your height is:    Your current weight is:   Your Body Mass Index (BMI) is:     Following the type of diet specifically designed for you will help  prevent another stroke.  Your goal weight range is:    Your goal Body Mass Index (BMI) is 19-24.  Healthy food habits can help reduce 3 risk factors for stroke:  High cholesterol, hypertension, and excess weight.  RESOURCES Stroke/Support Group:  Call [phone]   STROKE EDUCATION PROVIDED/REVIEWED AND GIVEN TO PATIENT Stroke warning signs and symptoms How to activate emergency medical system (call 911). Medications prescribed at discharge. Need for follow-up after discharge. Personal risk factors for stroke. Pneumonia vaccine given:  Flu vaccine given:  My questions have been answered, the writing is legible, and I understand these instructions.  I will adhere to these goals & educational materials that have been provided to me after my discharge from the hospital.      My questions have been answered and I understand these instructions. I will adhere to these goals and the provided educational materials after my discharge from the hospital.  Patient/Caregiver Signature _______________________________ Date __________  Clinician Signature _______________________________________ Date __________  Please bring this form and your medication list with you to all your follow-up doctor's appointments.

## 2020-06-22 NOTE — Progress Notes (Signed)
Inpatient Rehabilitation Care Coordinator  Discharge Note  The overall goal for the admission was met for:   Discharge location: Discharge to family home with daughter and son.  Length of Stay: 20 days with dishcarge 06/22/20  Discharge activity level:  Patient to discharge at a wheelchair level [place].  Home/community participation: Limited  Services provided included: MD, RD, PT, OT, SLP, RN, CM, Pharmacy and [place]: Medicare  Follow-up services arranged: Home Health: PT, OT, Aide, DME: Wheelchair, Hospital Bed, Shower Bench and Patient/Family has no preference for HH/DME agencies  Comments (or additional information):  [facility] [phone]  Patient/Family verbalized understanding of follow-up arrangements: Yes  Family education completed on 06/21/20.  Patient's care partner attended hands-on education/training and is independent to provide the necessary physical and cognitive assistance at discharge.  Individual responsible for coordination of the follow-up plan: Daughter:[name] [phone]  Confirmed correct DME delivered:16" x 16" lightweight wheelchair  [facility] has been in touch with the daughter regarding delivery of the hospital bed and shower bench to the home.  Ramp for entry to the home recommended at family education session on 06/21/20. Information given to daughter for ramp rental and additional options/resources for building/installing a ramp. She has set up assistance to get the patient safely in the home at discharge until the ramp can be installed.  [name] 06/22/2020    [name]

## 2020-06-26 ENCOUNTER — Telehealth: Payer: Self-pay | Admitting: Registered Nurse

## 2020-06-26 NOTE — Telephone Encounter (Signed)
Placed a call to Ann Coleman ( daughter) of Ann Coleman, no answer. Left message to return the call.

## 2020-06-28 ENCOUNTER — Telehealth: Payer: Self-pay | Admitting: *Deleted

## 2020-06-28 NOTE — Telephone Encounter (Signed)
Physical therapist left a message asking for verbal orders for HHPT 1wk1, 2wk4, 1wk4.  Medical record reviewed. Social work note reviewed.  Verbal orders given per office protocol.

## 2020-07-02 ENCOUNTER — Telehealth: Payer: Self-pay

## 2020-07-02 NOTE — Telephone Encounter (Signed)
Copied from CRM [id number]. Topic: Appointment Scheduling - New Patient >> Jun 19, 2020  8:54 AM Lyn Hollingshead D wrote: PT need a Hospital F/UP in the next two weeks, new patient / please advise >> Jul 02, 2020  4:06 PM Lauralee Evener wrote: APPT SCHEDULED WITH DR Delford Field 07/25/20 @ 11:00.  LVM ON PT CONTACT PH#. Forward to CM for review for TOC.

## 2020-07-03 NOTE — Telephone Encounter (Signed)
Copied from CRM [id number]. Topic: Appointment Scheduling - New Patient >> Jul 02, 2020  4:06 PM Lauralee Evener wrote: APPT SCHEDULED WITH DR Delford Field 07/25/20 @ 11:00.  LVM ON PT CONTACT PH#.

## 2020-07-05 ENCOUNTER — Encounter: Payer: Medicare Other | Attending: Physical Medicine & Rehabilitation | Admitting: Physical Medicine & Rehabilitation

## 2020-07-05 DIAGNOSIS — I10 Essential (primary) hypertension: Secondary | ICD-10-CM | POA: Diagnosis not present

## 2020-07-05 DIAGNOSIS — E785 Hyperlipidemia, unspecified: Secondary | ICD-10-CM | POA: Diagnosis not present

## 2020-07-05 DIAGNOSIS — I69351 Hemiplegia and hemiparesis following cerebral infarction affecting right dominant side: Secondary | ICD-10-CM

## 2020-07-05 NOTE — Telephone Encounter (Signed)
Patient no-showed today's visit

## 2020-07-18 ENCOUNTER — Telehealth: Payer: Self-pay

## 2020-07-18 NOTE — Telephone Encounter (Signed)
She needs an appointment. Thanks

## 2020-07-18 NOTE — Telephone Encounter (Signed)
Called Gracie and notified.

## 2020-07-18 NOTE — Telephone Encounter (Signed)
Gracie, PT from [facility] called requesting a script for 2 wheel walker to be faxed to [phone]. Pt no showed for TC appt and no PCP is listed.

## 2020-07-25 ENCOUNTER — Inpatient Hospital Stay: Payer: Medicare Other | Admitting: Critical Care Medicine

## 2020-07-25 NOTE — Progress Notes (Deleted)
Subjective:    Patient ID: Tyniya Vandam, female    DOB: December 22, 1923, 86 y.o.   MRN: [id number]   Admit date: 06/02/2020 Discharge date: 06/22/2020  Discharge Diagnoses:  Principal Problem:   Brainstem infarct, acute [facility]) Active Problems:   Left pontine cerebrovascular accident (HCC)   AKI (acute kidney injury) (HCC)   Chest pressure   Essential hypertension   Labile blood pressure   Dysphagia, post-stroke   Chronic pain of both knees   Right hemiparesis (HCC)   Elevated blood pressure reading   CKD (chronic kidney disease), stage II   Wheezing   Prediabetes   Dyspnea   Diastolic dysfunction   Morbid obesity (HCC) Hyperlipidemia  Discharged Condition: Stable  Significant Diagnostic Studies:  Imaging Results CT ANGIO HEAD W OR WO CONTRAST  Addendum Date: 05/30/2020   ADDENDUM REPORT: 05/30/2020 13:23 ADDENDUM: Enlarged heterogeneous thyroid gland. Largest single nodule 2 cm. recommend thyroid ultrasound (ref: J Am Coll Radiol. 2015 Feb;12(2): 143-50). Electronically Signed   By: Paulina Fusi M.D.   On: 05/30/2020 13:23   Result Date: 05/30/2020 CLINICAL DATA:  Acute presentation with slurred speech and leg weakness. Small left pontine infarction suspected by MRI. EXAM: CT ANGIOGRAPHY HEAD AND NECK TECHNIQUE: Multidetector CT imaging of the head and neck was performed using the standard protocol during bolus administration of intravenous contrast. Multiplanar CT image reconstructions and MIPs were obtained to evaluate the vascular anatomy. Carotid stenosis measurements (when applicable) are obtained utilizing NASCET criteria, using the distal internal carotid diameter as the denominator. CONTRAST:  75mL OMNIPAQUE IOHEXOL 350 MG/ML SOLN COMPARISON:  MR studies done yesterday. FINDINGS: CTA NECK FINDINGS Aortic arch: Aortic atherosclerosis. No aneurysm or brachiocephalic vessel origin stenosis. 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 Right  carotid system: Common carotid artery is widely patent to the bifurcation region. Atherosclerotic plaque at carotid bifurcation and ICA bulb but without stenosis compared to the more distal cervical ICA diameter. Left carotid system: Common carotid artery widely patent to the bifurcation. Atherosclerotic plaque at the carotid bifurcation and ICA bulb but without stenosis compared to the more distal cervical ICA diameter. Vertebral arteries: Both vertebral artery origins are widely patent. Both vertebral arteries are widely patent through the cervical region to the foramen magnum. Skeleton: Ordinary cervical spondylosis. Right frontal calvarial mass as described at previous MRI. Other neck: No lymphadenopathy. Enlarged heterogeneous thyroid gland with multiple nodules, the largest measuring 2.1 cm. Upper chest: Negative Review of the MIP images confirms the above findings CTA HEAD FINDINGS Anterior circulation: Both internal carotid arteries are patent through the skull base and siphon regions. Ordinary siphon atherosclerotic calcification but without stenosis greater than 30%. The anterior and middle cerebral vessels are patent without correctable proximal stenosis. The anterior temporal branch of the MCA on the left appears to be occluded. Posterior circulation: Both vertebral arteries are widely patent to the basilar. Mild atherosclerotic irregularity of the basilar artery with distal basilar narrowing of about 30%. Superior cerebellar and posterior cerebral arteries are patent. Distal PCA branch vessels show atherosclerotic irregularity. Venous sinuses: Patent and normal. Anatomic variants: None other significant. Review of the MIP images confirms the above findings IMPRESSION: Aortic atherosclerosis. 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 Atherosclerotic change at both carotid bifurcations but no stenosis. Atherosclerotic change of the carotid siphon regions but without  stenosis greater than 30%. Apparent occlusion of the anterior temporal branch of the left middle cerebral  artery. This is probably chronic, similar to the MR angiography of 2018. Distal vessel atherosclerotic narrowing and irregularity of the MCA and PCA branches. Atherosclerotic irregularity of the basilar artery, maximal stenosis in the distal basilar region of approximately 30%. Right frontal calvarial lytic mass as described by MRI. Differential diagnosis is primarily metastatic disease versus myeloma versus intra osseous meningioma versus enlarging hemangioma. Electronically Signed: By: Paulina Fusi M.D. On: 05/30/2020 12:02   DG Chest 2 View  Result Date: 06/19/2020 CLINICAL DATA:  Shortness of breath. EXAM: CHEST - 2 VIEW COMPARISON:  03/04/2017. FINDINGS: Mediastinum and hilar structures normal. Heart size stable. No pulmonary venous congestion. Mild bibasilar atelectasis/infiltrates. Small right pleural effusion. No pneumothorax. Surgical clips right upper quadrant. IMPRESSION: Low lung volumes with mild bibasilar atelectasis/infiltrates. Small right pleural effusion. Electronically Signed   By: Maisie Fus  Register   On: 06/19/2020 14:31   CT ANGIO NECK W OR WO CONTRAST  Addendum Date: 05/30/2020   ADDENDUM REPORT: 05/30/2020 13:23 ADDENDUM: Enlarged heterogeneous thyroid gland. Largest single nodule 2 cm. recommend thyroid ultrasound (ref: J Am Coll Radiol. 2015 Feb;12(2): 143-50). Electronically Signed   By: Paulina Fusi M.D.   On: 05/30/2020 13:23   Result Date: 05/30/2020 CLINICAL DATA:  Acute presentation with slurred speech and leg weakness. Small left pontine infarction suspected by MRI. EXAM: CT ANGIOGRAPHY HEAD AND NECK TECHNIQUE: Multidetector CT imaging of the head and neck was performed using the standard protocol during bolus administration of intravenous contrast. Multiplanar CT image reconstructions and MIPs were obtained to evaluate the vascular anatomy. Carotid stenosis  measurements (when applicable) are obtained utilizing NASCET criteria, using the distal internal carotid diameter as the denominator. CONTRAST:  34mL OMNIPAQUE IOHEXOL 350 MG/ML SOLN COMPARISON:  MR studies done yesterday. FINDINGS: CTA NECK FINDINGS Aortic arch: Aortic atherosclerosis. No aneurysm or brachiocephalic vessel origin stenosis. 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 Right carotid system: Common carotid artery is widely patent to the bifurcation region. Atherosclerotic plaque at carotid bifurcation and ICA bulb but without stenosis compared to the more distal cervical ICA diameter. Left carotid system: Common carotid artery widely patent to the bifurcation. Atherosclerotic plaque at the carotid bifurcation and ICA bulb but without stenosis compared to the more distal cervical ICA diameter. Vertebral arteries: Both vertebral artery origins are widely patent. Both vertebral arteries are widely patent through the cervical region to the foramen magnum. Skeleton: Ordinary cervical spondylosis. Right frontal calvarial mass as described at previous MRI. Other neck: No lymphadenopathy. Enlarged heterogeneous thyroid gland with multiple nodules, the largest measuring 2.1 cm. Upper chest: Negative Review of the MIP images confirms the above findings CTA HEAD FINDINGS Anterior circulation: Both internal carotid arteries are patent through the skull base and siphon regions. Ordinary siphon atherosclerotic calcification but without stenosis greater than 30%. The anterior and middle cerebral vessels are patent without correctable proximal stenosis. The anterior temporal branch of the MCA on the left appears to be occluded. Posterior circulation: Both vertebral arteries are widely patent to the basilar. Mild atherosclerotic irregularity of the basilar artery with distal basilar narrowing of about 30%. Superior cerebellar and posterior cerebral arteries are patent. Distal PCA  branch vessels show atherosclerotic irregularity. Venous sinuses: Patent and normal. Anatomic variants: None other significant. Review of the MIP images confirms the above findings IMPRESSION: Aortic atherosclerosis. 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 Atherosclerotic change at both carotid bifurcations but no stenosis. Atherosclerotic change of  the carotid siphon regions but without stenosis greater than 30%. 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 This is probably chronic, similar to the MR angiography of 2018. Distal vessel atherosclerotic narrowing and irregularity of the MCA and PCA branches. Atherosclerotic irregularity of the basilar artery, maximal stenosis in the distal basilar region of approximately 30%. Right frontal calvarial lytic mass as described by MRI. Differential diagnosis is primarily metastatic disease versus myeloma versus intra osseous meningioma versus enlarging hemangioma. Electronically Signed: By: Paulina Fusi M.D. On: 05/30/2020 12:02   MR ANGIO HEAD WO CONTRAST  Result Date: 05/30/2020 CLINICAL DATA:  Initial evaluation for acute stroke, slurred speech, right-sided weakness. EXAM: MRI HEAD WITHOUT CONTRAST MRA HEAD WITHOUT CONTRAST TECHNIQUE: Multiplanar, multiecho pulse sequences of the brain and surrounding structures were obtained without intravenous contrast. Angiographic images of the head were obtained using MRA technique without contrast. COMPARISON:  Prior CT from earlier same day as well as previous MRI from 03/04/2017. FINDINGS: MRI HEAD FINDINGS Brain: Examination significantly degraded by motion artifact. Diffuse prominence of the CSF containing spaces compatible generalized age-related cerebral atrophy. Patchy T2/FLAIR hyperintensity within the periventricular and deep white matter both cerebral hemispheres most consistent with chronic small vessel ischemic disease, moderate  in nature. Multiple remote lacunar infarcts present about the bilateral basal ganglia and right thalamus. Subtle approximate 6 mm focus of diffusion abnormality seen involving the left paramedian pons, suspicious for an acute ischemic infarct (series 2, image 15). No visible associated hemorrhage or mass effect on this motion degraded exam. No other diffusion abnormality to suggest acute or subacute ischemia. Gray-white matter differentiation otherwise maintained. No other areas of remote cortical infarction. No foci of susceptibility artifact to suggest acute or chronic intracranial hemorrhage on this motion degraded exam. 2.8 cm mass involving the right frontal calvarium with extraosseous extension again seen, indeterminate. No associated mass effect on the subjacent right frontal lobe. No other mass lesion. No mass effect or midline shift. No hydrocephalus or extra-axial fluid collection. Pituitary gland grossly within normal limits. Midline structures intact. Vascular: Major intracranial vascular flow voids are maintained. Skull and upper cervical spine: Craniocervical junction within normal limits. Upper cervical spine normal. Bone marrow signal intensity within normal limits. No other focal marrow replacing lesions. Scalp soft tissues otherwise unremarkable. Sinuses/Orbits: Globes and orbital soft tissues within normal limits. Visualized paranasal sinuses are grossly clear. No significant mastoid effusion. Other: None. MRA HEAD FINDINGS ANTERIOR CIRCULATION: Examination moderately degraded by motion artifact. Visualized distal cervical segments of the internal carotid arteries are patent with antegrade flow. Petrous segments widely patent. Scattered atheromatous irregularity throughout the cavernous/supraclinoid ICAs without hemodynamically significant stenosis. Right A1 segment widely patent. Left A1 hypoplastic and/or absent, accounting for the diminutive left ICA is compared to the right. Normal anterior  communicating artery complex. Anterior cerebral arteries patent to their distal aspects without appreciable stenosis. M1 segments patent bilaterally. Grossly normal MCA bifurcations. Distal MCA branches well perfused and fairly symmetric. POSTERIOR CIRCULATION: Vertebral arteries patent to the vertebrobasilar junction without stenosis. Left vertebral artery slightly dominant. Neither PICA origin visualized. Basilar widely patent proximally. There is a short-segment moderate stenosis involving the mid basilar artery (series 2, image 91). Basilar otherwise patent to its distal aspect. Superior cerebral arteries grossly patent proximally. PCAs primarily supplied via the basilar, both of which are grossly patent to their distal aspects without definite stenosis, although evaluation limited by motion. No intracranial aneurysm. IMPRESSION: MRI HEAD IMPRESSION: 1. Motion degraded exam. 2. Subtle  approximate 6 mm acute ischemic nonhemorrhagic left pontine infarct. 3. 2.8 cm mass involving the right frontal calvarium with extraosseous extension, indeterminate. In retrospect, this lesion appears to have been present on prior MRI from 2018, although is Donn larger on today's exam. Again, primary differential considerations include a possible intra osseous meningioma versus osseous metastasis. Neuro surgical consultation and referral for consideration of possible histologic sampling suggested for further evaluation. Additionally, postcontrast imaging could be performed for complete evaluation as clinically desired. 4. Underlying age-related cerebral atrophy with moderate chronic microvascular ischemic disease, with multiple remote lacunar infarcts about the bilateral basal ganglia and right thalamus. MRA HEAD IMPRESSION: 1. Motion degraded exam. 2. Negative intracranial MRA for large vessel occlusion. 3. 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 4. Additional scattered intracranial atherosclerotic  irregularity, with no other hemodynamically significant or correctable stenosis identified. Electronically Signed   By: Rise Mu M.D.   On: 05/30/2020 00:34   MR BRAIN WO CONTRAST  Result Date: 05/31/2020 CLINICAL DATA:  Pontine infarct, follow-up EXAM: MRI HEAD WITHOUT CONTRAST TECHNIQUE: Multiplanar, multiecho pulse sequences of the brain and surrounding structures were obtained without intravenous contrast. Specifically, axial and coronal DWI and axial T2 FLAIR sequences were obtained. COMPARISON:  05/29/2020 FINDINGS: Brain: Increase in size of acute infarction involving left parasagittal pons. This now measures approximately up to 13 mm (previously 6 mm). Ventricles are stable in size. Findings of chronic microvascular ischemic changes and chronic small vessel infarct again noted. Vascular: Major vessel flow voids at the skull base are preserved. Skull and upper cervical spine: Right frontal calvarial lesion with extraosseous extension is again identified and incompletely evaluated on this study. Sinuses/Orbits: Mild paranasal sinus mucosal thickening. Orbits are unremarkable. Other: None. IMPRESSION: Increase in size of small acute infarct of the left parasagittal pons. Electronically Signed   By: Guadlupe Spanish M.D.   On: 05/31/2020 19:31   MR BRAIN WO CONTRAST  Result Date: 05/30/2020 CLINICAL DATA:  Initial evaluation for acute stroke, slurred speech, right-sided weakness. EXAM: MRI HEAD WITHOUT CONTRAST MRA HEAD WITHOUT CONTRAST TECHNIQUE: Multiplanar, multiecho pulse sequences of the brain and surrounding structures were obtained without intravenous contrast. Angiographic images of the head were obtained using MRA technique without contrast. COMPARISON:  Prior CT from earlier same day as well as previous MRI from 03/04/2017. FINDINGS: MRI HEAD FINDINGS Brain: Examination significantly degraded by motion artifact. Diffuse prominence of the CSF containing spaces compatible generalized  age-related cerebral atrophy. Patchy T2/FLAIR hyperintensity within the periventricular and deep white matter both cerebral hemispheres most consistent with chronic small vessel ischemic disease, moderate in nature. Multiple remote lacunar infarcts present about the bilateral basal ganglia and right thalamus. Subtle approximate 6 mm focus of diffusion abnormality seen involving the left paramedian pons, suspicious for an acute ischemic infarct (series 2, image 15). No visible associated hemorrhage or mass effect on this motion degraded exam. No other diffusion abnormality to suggest acute or subacute ischemia. Gray-white matter differentiation otherwise maintained. No other areas of remote cortical infarction. No foci of susceptibility artifact to suggest acute or chronic intracranial hemorrhage on this motion degraded exam. 2.8 cm mass involving the right frontal calvarium with extraosseous extension again seen, indeterminate. No associated mass effect on the subjacent right frontal lobe. No other mass lesion. No mass effect or midline shift. No hydrocephalus or extra-axial fluid collection. Pituitary gland grossly within normal limits. Midline structures intact. Vascular: Major intracranial vascular flow voids are maintained. Skull and upper cervical spine: Craniocervical junction within normal  limits. Upper cervical spine normal. Bone marrow signal intensity within normal limits. No other focal marrow replacing lesions. Scalp soft tissues otherwise unremarkable. Sinuses/Orbits: Globes and orbital soft tissues within normal limits. Visualized paranasal sinuses are grossly clear. No significant mastoid effusion. Other: None. MRA HEAD FINDINGS ANTERIOR CIRCULATION: Examination moderately degraded by motion artifact. Visualized distal cervical segments of the internal carotid arteries are patent with antegrade flow. Petrous segments widely patent. Scattered atheromatous irregularity throughout the  cavernous/supraclinoid ICAs without hemodynamically significant stenosis. Right A1 segment widely patent. Left A1 hypoplastic and/or absent, accounting for the diminutive left ICA is compared to the right. Normal anterior communicating artery complex. Anterior cerebral arteries patent to their distal aspects without appreciable stenosis. M1 segments patent bilaterally. Grossly normal MCA bifurcations. Distal MCA branches well perfused and fairly symmetric. POSTERIOR CIRCULATION: Vertebral arteries patent to the vertebrobasilar junction without stenosis. Left vertebral artery slightly dominant. Neither PICA origin visualized. Basilar widely patent proximally. There is a short-segment moderate stenosis involving the mid basilar artery (series 2, image 91). Basilar otherwise patent to its distal aspect. Superior cerebral arteries grossly patent proximally. PCAs primarily supplied via the basilar, both of which are grossly patent to their distal aspects without definite stenosis, although evaluation limited by motion. No intracranial aneurysm. IMPRESSION: MRI HEAD IMPRESSION: 1. Motion degraded exam. 2. Subtle approximate 6 mm acute ischemic nonhemorrhagic left pontine infarct. 3. 2.8 cm mass involving the right frontal calvarium with extraosseous extension, indeterminate. In retrospect, this lesion appears to have been present on prior MRI from 2018, although is Darianna larger on today's exam. Again, primary differential considerations include a possible intra osseous meningioma versus osseous metastasis. Neuro surgical consultation and referral for consideration of possible histologic sampling suggested for further evaluation. Additionally, postcontrast imaging could be performed for complete evaluation as clinically desired. 4. Underlying age-related cerebral atrophy with moderate chronic microvascular ischemic disease, with multiple remote lacunar infarcts about the bilateral basal ganglia and right thalamus. MRA HEAD  IMPRESSION: 1. Motion degraded exam. 2. Negative intracranial MRA for large vessel occlusion. 3. 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 4. Additional scattered intracranial atherosclerotic irregularity, with no other hemodynamically significant or correctable stenosis identified. Electronically Signed   By: Rise Mu M.D.   On: 05/30/2020 00:34   DG Swallowing Func-Speech Pathology  Result Date: 06/01/2020 Objective Swallowing Evaluation: Type of Study: MBS-Modified Barium Swallow Study  Patient Details Name: Marija Misner MRN: [id number] Date of Birth: 1924-11-09 Today's Date: 06/01/2020 Time: SLP Start Time (ACUTE ONLY): 1100 -SLP Stop Time (ACUTE ONLY): 1125 SLP Time Calculation (min) (ACUTE ONLY): 25 min Past Medical History: Past Medical History: Diagnosis Date  High cholesterol   Hypertension   Stroke [facility])  Past Surgical History: Past Surgical History: Procedure Laterality Date  NO PAST SURGERIES   HPI: Twanna Polhamus is a 86 y.o. [name] speaking female with medical history significant of stroke in 2018, hypertension, hyperlipidemia.  Daughter reported pt could not get up to use the bathroom as her legs were weak.  Family also noticed that her speech was slurred.  When EMS arrived, they noticed that patient had right arm drift.  stat brain MRI was ordered and reviewed by neurology -showing an acute left pontine stroke which is felt to be the likely explanation for her right-sided weakness. Pt passed [place] in ED, but struggled taking a pill later.  No data recorded Assessment / Plan / Recommendation CHL IP CLINICAL IMPRESSIONS 05/31/2020 Clinical Impression Pt demonstrates a primary oral dysphagia with buccal weakness  but strong lingual manipulation. With extra time she is able to gather boluses over two piecemeal efforts for propulsion to pharynx. She has instances of trace oral residue with liquids that she does gather and propel, but does not immediately swallow, which resulted  in some trace sensed aspiration. Pharyngeal impairment is mostly characterized by delayed initiation with trace sensed aspiration of thin liquids before the swallow, seem with straw sips or very large sips. Pt is recommended to consume nectar thick drinks and soft ground (dys 2) solids (food from home permitted) and can also have teaspoons of thin soup. SLP will f/u, likely will be able to uprgrade to thin with further training for small sips and a dry swallow to fully clear residue.  SLP Visit Diagnosis Dysphagia, oral phase (R13.11);Dysphagia, oropharyngeal phase (R13.12) Attention and concentration deficit following -- Frontal lobe and executive function deficit following -- Impact on safety and function Mild aspiration risk   CHL IP TREATMENT RECOMMENDATION 05/31/2020 Treatment Recommendations Therapy as outlined in treatment plan below   Prognosis 05/31/2020 Prognosis for Safe Diet Advancement Good Barriers to Reach Goals -- Barriers/Prognosis Comment -- CHL IP DIET RECOMMENDATION 05/31/2020 SLP Diet Recommendations Dysphagia 2 (Fine chop) solids;Nectar thick liquid Liquid Administration via Cup;Straw Medication Administration Whole meds with liquid Compensations Slow rate;Small sips/bites;Lingual sweep for clearance of pocketing;Multiple dry swallows after each bite/sip Postural Changes Remain semi-upright after after feeds/meals (Comment)   CHL IP OTHER RECOMMENDATIONS 05/31/2020 Recommended Consults -- Oral Care Recommendations Oral care BID Other Recommendations --   CHL IP FOLLOW UP RECOMMENDATIONS 05/31/2020 Follow up Recommendations Inpatient Rehab   CHL IP FREQUENCY AND DURATION 05/31/2020 Speech Therapy Frequency (ACUTE ONLY) min 2x/week Treatment Duration 2 weeks      CHL IP ORAL PHASE 05/31/2020 Oral Phase Impaired Oral - Pudding Teaspoon -- Oral - Pudding Cup -- Oral - Honey Teaspoon -- Oral - Honey Cup -- Oral - Nectar Teaspoon -- Oral - Nectar Cup Decreased bolus cohesion;Left pocketing in lateral sulci;Right  pocketing in lateral sulci;Lingual/palatal residue;Delayed oral transit Oral - Nectar Straw Decreased bolus cohesion;Left pocketing in lateral sulci;Right pocketing in lateral sulci;Lingual/palatal residue;Delayed oral transit Oral - Thin Teaspoon -- Oral - Thin Cup Decreased bolus cohesion;Left pocketing in lateral sulci;Right pocketing in lateral sulci;Lingual/palatal residue;Delayed oral transit Oral - Thin Straw Decreased bolus cohesion;Left pocketing in lateral sulci;Right pocketing in lateral sulci;Lingual/palatal residue;Delayed oral transit Oral - Puree Decreased bolus cohesion;Left pocketing in lateral sulci;Right pocketing in lateral sulci;Lingual/palatal residue;Delayed oral transit Oral - Mech Soft Decreased bolus cohesion;Left pocketing in lateral sulci;Right pocketing in lateral sulci;Lingual/palatal residue;Delayed oral transit Oral - Regular -- Oral - Multi-Consistency -- Oral - Pill -- Oral Phase - Comment Pt demonstrates a primary oral dysphagia with buccal weakness but strong lingual manipulation. With extra time she is albe to gather boluses over two piecemeal efforts for propulsion to pharynx. She has instances of trace oral residue with liquids that she does gather and propel, but does not immediately swallow, which resuleted in some trace sensed aspiration. Pharyngeal impairment is mostly characterized by delayed initiation with trace sensed aspriation of thin liquids before the swallow, seem with stra sips or very large sips. Pt is recommended to consume nectar thick drinks and soft ground (dys 2) solids (food from home permitted) and can also have teaspoons of thin soup. SLP will f/u, likely will be able to uprgrade to thin with further training for small sips and a dry swallow to fully clear residue.   CHL IP PHARYNGEAL PHASE 05/31/2020 Pharyngeal  Phase Impaired Pharyngeal- Pudding Teaspoon -- Pharyngeal -- Pharyngeal- Pudding Cup -- Pharyngeal -- Pharyngeal- Honey Teaspoon -- Pharyngeal --  Pharyngeal- Honey Cup -- Pharyngeal -- Pharyngeal- Nectar Teaspoon -- Pharyngeal -- Pharyngeal- Nectar Cup Delayed swallow initiation-pyriform sinuses Pharyngeal -- Pharyngeal- Nectar Straw Delayed swallow initiation-pyriform sinuses Pharyngeal -- Pharyngeal- Thin Teaspoon -- Pharyngeal -- Pharyngeal- Thin Cup Delayed swallow initiation-pyriform sinuses;Penetration/Aspiration before swallow;Penetration/Apiration after swallow Pharyngeal Material enters airway, passes BELOW cords then ejected out Pharyngeal- Thin Straw Penetration/Aspiration before swallow;Delayed swallow initiation-pyriform sinuses Pharyngeal Material enters airway, passes BELOW cords then ejected out Pharyngeal- Puree Delayed swallow initiation-vallecula Pharyngeal -- Pharyngeal- Mechanical Soft Delayed swallow initiation-vallecula Pharyngeal -- Pharyngeal- Regular -- Pharyngeal -- Pharyngeal- Multi-consistency -- Pharyngeal -- Pharyngeal- Pill -- Pharyngeal -- Pharyngeal Comment --  No flowsheet data found. DeBlois, Riley NearingBonnie Caroline 06/01/2020, 10:49 AM              ECHOCARDIOGRAM COMPLETE  Result Date: 05/30/2020    ECHOCARDIOGRAM REPORT   [facility]    Date of Exam: 05/30/2020 Medical Rec #:  [id number]  Height:       59.0 in Accession #:    [id number] Weight:       151.5 lb Date of Birth:  09/25/1924   BSA:          1.639 m Patient Age:    89 years   BP:           136/101 mmHg Patient Gender: F          HR:           75 bpm. Exam Location:  Inpatient Procedure: 2D Echo, Cardiac Doppler and Color Doppler Indications:    Stroke 434.91 / I163.9  History:        Patient has prior history of Echocardiogram examinations, most                 recent 03/06/2017. Risk Factors:Hypertension and Dyslipidemia.  Sonographer:    Elmarie Shileyiffany Dance Referring Phys: [id number] VASUNDHRA RATHORE IMPRESSIONS  1. Intracavitary gradient. Peak velocity 2.1 m/s. Peak gradient 17.6 mmHg. Left ventricular ejection fraction, by estimation, is >75%. The left ventricle  has hyperdynamic function. The left ventricle has no regional wall motion abnormalities. There is mild concentric left ventricular hypertrophy. Left ventricular diastolic parameters are consistent with Grade I diastolic dysfunction (impaired relaxation).  2. Right ventricular systolic function is normal. The right ventricular size is normal. There is normal pulmonary artery systolic pressure.  3. Left atrial size was mildly dilated.  4. The mitral valve is normal in structure. No evidence of mitral valve regurgitation. No evidence of mitral stenosis.  5. The aortic valve is tricuspid. Aortic valve regurgitation is mild. No aortic stenosis is present.  6. The inferior vena cava is normal in size with greater than 50% respiratory variability, suggesting right atrial pressure of 3 mmHg. FINDINGS  Left Ventricle: Intracavitary gradient. Peak velocity 2.1 m/s. Peak gradient 17.6 mmHg. Left ventricular ejection fraction, by estimation, is >75%. The left ventricle has hyperdynamic function. The left ventricle has no regional wall motion abnormalities. The left ventricular internal cavity size was normal in size. There is mild concentric left ventricular hypertrophy. Left ventricular diastolic parameters are consistent with Grade I diastolic dysfunction (impaired relaxation). Right Ventricle: The right ventricular size is normal. No increase in right ventricular wall thickness. Right ventricular systolic function is normal. There is normal pulmonary artery systolic pressure. The tricuspid regurgitant velocity is 1.67 m/s, and  with an assumed  right atrial pressure of 3 mmHg, the estimated right ventricular systolic pressure is 14.2 mmHg. Left Atrium: Left atrial size was mildly dilated. Right Atrium: Right atrial size was normal in size. Pericardium: A small pericardial effusion is present. There is no evidence of cardiac tamponade. Mitral Valve: The mitral valve is normal in structure. Normal mobility of the mitral valve  leaflets. No evidence of mitral valve regurgitation. No evidence of mitral valve stenosis. Tricuspid Valve: The tricuspid valve is normal in structure. Tricuspid valve regurgitation is trivial. No evidence of tricuspid stenosis. Aortic Valve: The aortic valve is tricuspid. Aortic valve regurgitation is mild. Aortic regurgitation PHT measures 300 msec. No aortic stenosis is present. Pulmonic Valve: The pulmonic valve was normal in structure. Pulmonic valve regurgitation is trivial. No evidence of pulmonic stenosis. Aorta: The aortic root is normal in size and structure. Venous: The inferior vena cava is normal in size with greater than 50% respiratory variability, suggesting right atrial pressure of 3 mmHg. IAS/Shunts: No atrial level shunt detected by color flow Doppler.  LEFT VENTRICLE PLAX 2D LVIDd:         3.21 cm LVIDs:         2.24 cm LV PW:         1.26 cm LV IVS:        1.20 cm LVOT diam:     2.00 cm LV SV:         75 LV SV Index:   46 LVOT Area:     3.14 cm  RIGHT VENTRICLE            IVC RV Basal diam:  2.16 cm    IVC diam: 1.53 cm RV S prime:     9.03 cm/s TAPSE (M-mode): 1.9 cm LEFT ATRIUM             Index       RIGHT ATRIUM           Index LA diam:        4.20 cm 2.56 cm/m  RA Area:     12.50 cm LA Vol (A2C):   32.7 ml 19.95 ml/m RA Volume:   27.70 ml  16.90 ml/m LA Vol (A4C):   50.2 ml 30.63 ml/m LA Biplane Vol: 41.9 ml 25.57 ml/m  AORTIC VALVE LVOT Vmax:   101.00 cm/s LVOT Vmean:  75.100 cm/s LVOT VTI:    0.238 m AI PHT:      300 msec  AORTA Ao Root diam: 2.90 cm Ao Asc diam:  3.50 cm MITRAL VALVE                TRICUSPID VALVE MV Area (PHT): 2.50 cm     TR Peak grad:   11.2 mmHg MV Decel Time: 303 msec     TR Vmax:        167.00 cm/s MV E velocity: 69.20 cm/s MV A velocity: 127.00 cm/s  SHUNTS MV E/A ratio:  0.54         Systemic VTI:  0.24 m                             Systemic Diam: 2.00 cm Chilton Si MD Electronically signed by Chilton Si MD Signature Date/Time:  05/30/2020/12:19:32 PM    Final    CT HEAD CODE STROKE WO CONTRAST  Result Date: 05/29/2020 CLINICAL DATA:  Code stroke. Initial evaluation for acute slurred speech, leg weakness. EXAM: CT HEAD WITHOUT  CONTRAST TECHNIQUE: Contiguous axial images were obtained from the base of the skull through the vertex without intravenous contrast. COMPARISON:  Prior study from 03/04/2017. FINDINGS: Brain: Generalized age-related cerebral atrophy with moderate chronic microvascular ischemic disease. Scattered remote lacunar infarcts present about the bilateral basal ganglia and right internal capsule/thalamus. No acute intracranial hemorrhage. No acute large vessel territory infarct. No parenchymal mass lesion, mass effect, or midline shift. No hydrocephalus or extra-axial fluid collection. There is a hyperdense ovoid lesion measuring approximately 2.5 cm positioned within the right frontal calvarium (series 3, image 18). Upon review of prior CT from 2018, this lesion was likely present, although faintly visible at that time. Associated lytic changes within the underlying calvarium with probable associated pathologic fracture (series 4, image 46). Soft tissue component extends into the overlying right frontal scalp as well. Vascular: No hyperdense vessel. Calcified atherosclerosis present at the skull base. Skull: 2.5 cm lytic lesion involving the right frontal calvarium as above. Calvarium otherwise intact. No other scalp soft tissue abnormality. Sinuses/Orbits: Globes and orbital soft tissues within normal limits. Scattered mucosal thickening noted within the visualized ethmoidal air cells. Visualized paranasal sinuses are otherwise clear. No mastoid effusion. Other: None. ASPECTS [facility] Stroke Program Early CT Score) - Ganglionic level infarction (caudate, lentiform nuclei, internal capsule, insula, M1-M3 cortex): 7 - Supraganglionic infarction (M4-M6 cortex): 3 Total score (0-10 with 10 being normal): 10 IMPRESSION: 1.  No acute intracranial infarct or other abnormality. 2. ASPECTS is 10. 3. Approximate 2.5 cm lytic lesion involving the right frontal calvarium with probable associated pathologic fracture. Finding is nonspecific, although is suspected to have been present on previous CT from 2018, although only faintly visible at that time. Primary differential consideration consists of an intra osseous meningioma, although possible osseous metastasis not excluded. Further evaluation with dedicated brain MRI, with and without contrast suggested for further evaluation. Additionally, neuro surgical consultation and referral for possible histologic sampling suggested. 4. Underlying age-related cerebral atrophy with moderate chronic microvascular ischemic disease, with multiple remote lacunar infarcts about the bilateral basal ganglia as above. Critical Value/emergent results were called by telephone at the time of interpretation on 05/29/2020 at 10:32 pm to provider [facility] , who verbally acknowledged these results. Electronically Signed   By: Rise Mu M.D.   On: 05/29/2020 22:36    Labs:  Basic Metabolic Panel: Last Labs  Recent Labs Lab 06/15/20 0648 06/18/20 1243 NA 140 140 K 4.2 3.9 CL 108 106 CO2 24 24 GLUCOSE 106* 172* BUN 12 11 CREATININE 0.86 0.83 CALCIUM 8.5* 9.0    CBC: Last Labs  Recent Labs Lab 06/15/20 0648 06/21/20 0546 WBC 5.0 5.1 NEUTROABS 2.5 2.1 HGB 12.1 12.4 HCT 38.5 39.8 MCV 84.1 82.1 PLT 200 270    CBG: Last Labs  No results for input(s): GLUCAP in the last 168 hours.   Family history.  History of hypertension.  Denies any CVA diabetes mellitus esophageal cancer or rectal cancer  Brief HPI:   Dinorah Hatton is a 86 y.o. right-handed non-English-speaking [name] female with history of hypertension as well as hyperlipidemia CVA 3 years ago.  Per chart review lives with her children 1 level home 2 steps to entry.  Reportedly independent prior to admission  using a walker for assistance.  Presented 05/29/2020 with  right-sided weakness and slurred speech.  Noted systolic blood pressure in the 190s.  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  In retrospect of this lesion appeared to have been present on her prior MRI from 2018.  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  This did appear chronic and again noted on MRI 2018.  Echocardiogram with ejection fraction 75% no wall motion abnormalities.  MRI of the brain was again completed 05/31/2020 due to increased left side weakness showing mild increase in size of small acute infarct of the left parasagittal pons.  Admission chemistries BUN 5 creatinine 1.03 SARS current virus negative.  She was on a dysphagia #2 nectar thick liquid diet.  Maintain on aspirin and Plavix for CVA prophylaxis x3 weeks then aspirin alone.  Subcutaneous Lovenox for DVT prophylaxis.  Therapy evaluations completed and patient was admitted for a comprehensive rehab program   Hospital Course: Letitia Harwick was admitted to rehab 06/02/2020 for inpatient therapies to consist of PT, ST and OT at least three hours five days a week. Past admission physiatrist, therapy team and rehab RN have worked together to provide customized collaborative inpatient rehab.  Pertaining to patient's acute left pontine infarction she remained on aspirin and Plavix x3 weeks since completed she would continue aspirin 81 mg daily for CVA prophylaxis.  Lovenox for DVT prophylaxis no bleeding episodes.  Pain management with bilateral knee arthritic changes Voltaren gel as ordered she was also using a Lidoderm patch.  Diet have been advanced to mechanical soft thin liquids tolerating with speech therapy follow-up.  Lipitor  for hyperlipidemia.  Blood pressure monitored while on lisinopril 10 mg daily and would need follow-up with her primary MD.  CKD stage II with latest creatinine 0.83.  Prediabetes with hemoglobin A1c 6.1 she was transitioned to a carb modified diet.  Intermittent wheezing chest x-ray showed some low lung volumes with mild bibasilar atelectasis small right pleural effusion.  Her oxygen saturations greater than 90% on room air   Blood pressures were monitored on TID basis and with some permissive elevations  Diabetes has been monitored with ac/hs CBG checks and SSI was use prn for tighter BS control.          Review of Systems     Objective:   Physical Exam        Assessment & Plan:

## 2020-08-09 ENCOUNTER — Telehealth: Payer: Self-pay

## 2020-08-09 NOTE — Telephone Encounter (Signed)
Opened in error

## 2020-08-21 NOTE — Progress Notes (Deleted)
[facility]    Provider:  Dr Lucia Gaskins Requesting Provider: Marvel Plan, MD Primary Care Provider:  Patient, No Pcp Per  CC:  ***  HPI:  Ann Coleman is a 86 y.o. female here as requested by Marvel Plan, MD for follow up after hospitalization.  This is a new referral from Dr. Chase Caller, I reviewed hospitalization notes: Patient presented May 29, 2020 with right arm weakness and slurred speech, she has a past medical history of hypertension, hyperlipidemia, lacunar stroke in 2018 with left lower extremity weakness, presented to the emergency room a after she complained of difficulty walking, EMS noted to have right-sided arm drift and slurred speech, she only speaks, [place], brought in as an acute stroke code.  MRI showed left pontine infarct secondary to small vessel disease, multiple old bilateral basal ganglia lacune's, atrophy, and old right thalamic lacune's, mid basilar artery moderate stenosis, 30%, chronic left MCA  Reviewed notes, labs and imaging from outside physicians, which showed ***  Review of Systems: Patient complains of symptoms per HPI as well as the following symptoms ***. Pertinent negatives and positives per HPI. All others negative.   Social History   Socioeconomic History  . Marital status: Divorced    Spouse name: Not on file  . Number of children: Not on file  . Years of education: Not on file  . Highest education level: Not on file  Occupational History  . Not on file  Tobacco Use  . Smoking status: Never Smoker  . Smokeless tobacco: Never Used  Vaping Use  . Vaping Use: Never used  Substance and Sexual Activity  . Alcohol use: No  . Drug use: No  . Sexual activity: Never  Other Topics Concern  . Not on file  Social History Narrative   Lives w/ daughter   Right-handed   No caffeine   Social Determinants of Health   Financial Resource Strain:   . Difficulty of Paying Living Expenses: Not on file  Food Insecurity:   . Worried  About Programme researcher, broadcasting/film/video in the Last Year: Not on file  . Ran Out of Food in the Last Year: Not on file  Transportation Needs:   . Lack of Transportation (Medical): Not on file  . Lack of Transportation (Non-Medical): Not on file  Physical Activity:   . Days of Exercise per Week: Not on file  . Minutes of Exercise per Session: Not on file  Stress:   . Feeling of Stress : Not on file  Social Connections:   . Frequency of Communication with Friends and Family: Not on file  . Frequency of Social Gatherings with Friends and Family: Not on file  . Attends Religious Services: Not on file  . Active Member of Clubs or Organizations: Not on file  . Attends Banker Meetings: Not on file  . Marital Status: Not on file  Intimate Partner Violence:   . Fear of Current or Ex-Partner: Not on file  . Emotionally Abused: Not on file  . Physically Abused: Not on file  . Sexually Abused: Not on file    Family History  Problem Relation Age of Onset  . Stroke Neg Hx   . Diabetes Mellitus II Neg Hx   . Hypertension Neg Hx     Past Medical History:  Diagnosis Date  . High cholesterol   . Hypertension   . Stroke [facility])     Patient Active Problem List   Diagnosis Date Noted  .  Morbid obesity (HCC)   . Prediabetes   . Dyspnea   . Diastolic dysfunction   . Wheezing   . Elevated blood pressure reading   . CKD (chronic kidney disease), stage II   . Chronic pain of both knees   . Right hemiparesis (HCC)   . AKI (acute kidney injury) (HCC)   . Chest pressure   . Essential hypertension   . Labile blood pressure   . Dysphagia, post-stroke   . Brainstem infarct, acute (HCC) 06/02/2020  . Left pontine cerebrovascular accident (HCC) 06/02/2020  . Acute CVA (cerebrovascular accident) (HCC) 05/29/2020  . HLD (hyperlipidemia) 05/29/2020  . Lacunar infarction (HCC) 05/10/2017  . Stroke (HCC) 03/05/2017  . Acute right MCA stroke (HCC) 03/04/2017  . Hypertensive urgency 03/04/2017  .  Stroke (cerebrum) (HCC) 03/04/2017    Past Surgical History:  Procedure Laterality Date  . NO PAST SURGERIES      Current Outpatient Medications  Medication Sig Dispense Refill  . acetaminophen (TYLENOL) 325 MG tablet Take 2 tablets (650 mg total) by mouth every 6 (six) hours.    Marland Kitchen aspirin EC 81 MG EC tablet Take 1 tablet (81 mg total) by mouth daily. Swallow whole. 30 tablet 11  . atorvastatin (LIPITOR) 40 MG tablet Take 1 tablet (40 mg total) by mouth daily. 30 tablet 0  . diclofenac Sodium (VOLTAREN) 1 % GEL Apply 2 g topically 4 (four) times daily. 2 g 0  . lidocaine (LIDODERM) 5 % Place 2 patches onto the skin daily. Remove & Discard patch within 12 hours or as directed by MD 30 patch 0  . lisinopril (ZESTRIL) 10 MG tablet Take 1 tablet (10 mg total) by mouth daily. 30 tablet 0   No current facility-administered medications for this visit.    Allergies as of 08/22/2020  . (No Known Allergies)    Vitals: There were no vitals taken for this visit. Last Weight:  Wt Readings from Last 1 Encounters:  05/30/20 151 lb 7.3 oz (68.7 kg)   Last Height:   Ht Readings from Last 1 Encounters:  05/29/20 4\' 11"  (1.499 m)     Physical exam: Exam: Gen: NAD, conversant, well nourised, obese, well groomed                     CV: RRR, no MRG. No Carotid Bruits. No peripheral edema, warm, nontender Eyes: Conjunctivae clear without exudates or hemorrhage  Neuro: Detailed Neurologic Exam  Speech:    Speech is normal; fluent and spontaneous with normal comprehension.  Cognition:    The patient is oriented to person, place, and time;     recent and remote memory intact;     language fluent;     normal attention, concentration,     fund of knowledge Cranial Nerves:    The pupils are equal, round, and reactive to light. The fundi are normal and spontaneous venous pulsations are present. Visual fields are full to finger confrontation. Extraocular movements are intact. Trigeminal  sensation is intact and the muscles of mastication are normal. The face is symmetric. The palate elevates in the midline. Hearing intact. Voice is normal. Shoulder shrug is normal. The tongue has normal motion without fasciculations.   Coordination:    Normal finger to nose and heel to shin. Normal rapid alternating movements.   Gait:    Heel-toe and tandem gait are normal.   Motor Observation:    No asymmetry, no atrophy, and no involuntary movements noted. Tone:  Normal muscle tone.    Posture:    Posture is normal. normal erect    Strength:    Strength is V/V in the upper and lower limbs.      Sensation: intact to LT     Reflex Exam:  DTR's:    Deep tendon reflexes in the upper and lower extremities are normal bilaterally.   Toes:    The toes are downgoing bilaterally.   Clonus:    Clonus is absent.    Assessment/Plan:    No orders of the defined types were placed in this encounter.  No orders of the defined types were placed in this encounter.   Cc: Marvel Plan, MD,  Patient, No Pcp Per  Naomie Dean, MD  [facility] [address] [place], [place] [address]  Phone [phone] Fax [phone]

## 2020-08-22 ENCOUNTER — Encounter: Payer: Self-pay | Admitting: Neurology

## 2020-08-22 ENCOUNTER — Institutional Professional Consult (permissible substitution): Payer: Medicare Other | Admitting: Neurology

## 2020-08-22 ENCOUNTER — Telehealth: Payer: Self-pay | Admitting: Neurology

## 2020-08-22 NOTE — Telephone Encounter (Signed)
Patient no-showed. If she calls back for follow up she can see Ann Coleman stroke nurse as this is a follow up from the hospital for stroke and has already been seen by stroke doctor in the hospital thanks

## 2020-12-26 ENCOUNTER — Encounter (HOSPITAL_COMMUNITY): Payer: Self-pay | Admitting: Emergency Medicine

## 2020-12-26 ENCOUNTER — Inpatient Hospital Stay (HOSPITAL_COMMUNITY): Payer: Medicare Other

## 2020-12-26 ENCOUNTER — Emergency Department (HOSPITAL_COMMUNITY): Payer: Medicare Other

## 2020-12-26 ENCOUNTER — Other Ambulatory Visit: Payer: Self-pay

## 2020-12-26 ENCOUNTER — Inpatient Hospital Stay (HOSPITAL_COMMUNITY)
Admission: EM | Admit: 2020-12-26 | Discharge: 2021-01-29 | DRG: 871 | Disposition: E | Payer: Medicare Other | Attending: Internal Medicine | Admitting: Internal Medicine

## 2020-12-26 DIAGNOSIS — J1282 Pneumonia due to coronavirus disease 2019: Secondary | ICD-10-CM | POA: Diagnosis present

## 2020-12-26 DIAGNOSIS — I5189 Other ill-defined heart diseases: Secondary | ICD-10-CM

## 2020-12-26 DIAGNOSIS — E861 Hypovolemia: Secondary | ICD-10-CM | POA: Diagnosis present

## 2020-12-26 DIAGNOSIS — E7800 Pure hypercholesterolemia, unspecified: Secondary | ICD-10-CM | POA: Diagnosis present

## 2020-12-26 DIAGNOSIS — I468 Cardiac arrest due to other underlying condition: Secondary | ICD-10-CM | POA: Diagnosis not present

## 2020-12-26 DIAGNOSIS — E781 Pure hyperglyceridemia: Secondary | ICD-10-CM | POA: Diagnosis not present

## 2020-12-26 DIAGNOSIS — N182 Chronic kidney disease, stage 2 (mild): Secondary | ICD-10-CM | POA: Diagnosis present

## 2020-12-26 DIAGNOSIS — Z515 Encounter for palliative care: Secondary | ICD-10-CM

## 2020-12-26 DIAGNOSIS — E46 Unspecified protein-calorie malnutrition: Secondary | ICD-10-CM | POA: Diagnosis present

## 2020-12-26 DIAGNOSIS — E871 Hypo-osmolality and hyponatremia: Secondary | ICD-10-CM | POA: Diagnosis present

## 2020-12-26 DIAGNOSIS — E440 Moderate protein-calorie malnutrition: Secondary | ICD-10-CM | POA: Diagnosis present

## 2020-12-26 DIAGNOSIS — J189 Pneumonia, unspecified organism: Secondary | ICD-10-CM | POA: Diagnosis present

## 2020-12-26 DIAGNOSIS — R0902 Hypoxemia: Secondary | ICD-10-CM

## 2020-12-26 DIAGNOSIS — R7401 Elevation of levels of liver transaminase levels: Secondary | ICD-10-CM | POA: Diagnosis not present

## 2020-12-26 DIAGNOSIS — Z4659 Encounter for fitting and adjustment of other gastrointestinal appliance and device: Secondary | ICD-10-CM

## 2020-12-26 DIAGNOSIS — Z7982 Long term (current) use of aspirin: Secondary | ICD-10-CM

## 2020-12-26 DIAGNOSIS — Z6830 Body mass index (BMI) 30.0-30.9, adult: Secondary | ICD-10-CM | POA: Diagnosis not present

## 2020-12-26 DIAGNOSIS — U071 COVID-19: Secondary | ICD-10-CM | POA: Diagnosis present

## 2020-12-26 DIAGNOSIS — R627 Adult failure to thrive: Secondary | ICD-10-CM | POA: Diagnosis present

## 2020-12-26 DIAGNOSIS — J9602 Acute respiratory failure with hypercapnia: Secondary | ICD-10-CM | POA: Diagnosis present

## 2020-12-26 DIAGNOSIS — I10 Essential (primary) hypertension: Secondary | ICD-10-CM | POA: Diagnosis present

## 2020-12-26 DIAGNOSIS — G9341 Metabolic encephalopathy: Secondary | ICD-10-CM | POA: Diagnosis present

## 2020-12-26 DIAGNOSIS — R54 Age-related physical debility: Secondary | ICD-10-CM | POA: Diagnosis present

## 2020-12-26 DIAGNOSIS — K838 Other specified diseases of biliary tract: Secondary | ICD-10-CM | POA: Diagnosis present

## 2020-12-26 DIAGNOSIS — N179 Acute kidney failure, unspecified: Secondary | ICD-10-CM | POA: Diagnosis present

## 2020-12-26 DIAGNOSIS — H9190 Unspecified hearing loss, unspecified ear: Secondary | ICD-10-CM | POA: Diagnosis present

## 2020-12-26 DIAGNOSIS — K5909 Other constipation: Secondary | ICD-10-CM | POA: Diagnosis present

## 2020-12-26 DIAGNOSIS — Z20822 Contact with and (suspected) exposure to covid-19: Secondary | ICD-10-CM

## 2020-12-26 DIAGNOSIS — J9601 Acute respiratory failure with hypoxia: Secondary | ICD-10-CM | POA: Diagnosis present

## 2020-12-26 DIAGNOSIS — I130 Hypertensive heart and chronic kidney disease with heart failure and stage 1 through stage 4 chronic kidney disease, or unspecified chronic kidney disease: Secondary | ICD-10-CM | POA: Diagnosis present

## 2020-12-26 DIAGNOSIS — E870 Hyperosmolality and hypernatremia: Secondary | ICD-10-CM | POA: Diagnosis not present

## 2020-12-26 DIAGNOSIS — Z7189 Other specified counseling: Secondary | ICD-10-CM | POA: Diagnosis not present

## 2020-12-26 DIAGNOSIS — A4189 Other specified sepsis: Principal | ICD-10-CM | POA: Diagnosis present

## 2020-12-26 DIAGNOSIS — J159 Unspecified bacterial pneumonia: Secondary | ICD-10-CM | POA: Diagnosis present

## 2020-12-26 DIAGNOSIS — I69320 Aphasia following cerebral infarction: Secondary | ICD-10-CM | POA: Diagnosis not present

## 2020-12-26 DIAGNOSIS — R109 Unspecified abdominal pain: Secondary | ICD-10-CM

## 2020-12-26 DIAGNOSIS — I5032 Chronic diastolic (congestive) heart failure: Secondary | ICD-10-CM | POA: Diagnosis present

## 2020-12-26 DIAGNOSIS — Z66 Do not resuscitate: Secondary | ICD-10-CM | POA: Diagnosis present

## 2020-12-26 DIAGNOSIS — E860 Dehydration: Secondary | ICD-10-CM | POA: Diagnosis present

## 2020-12-26 DIAGNOSIS — E041 Nontoxic single thyroid nodule: Secondary | ICD-10-CM | POA: Diagnosis present

## 2020-12-26 DIAGNOSIS — K8051 Calculus of bile duct without cholangitis or cholecystitis with obstruction: Secondary | ICD-10-CM | POA: Diagnosis present

## 2020-12-26 DIAGNOSIS — Z0189 Encounter for other specified special examinations: Secondary | ICD-10-CM

## 2020-12-26 DIAGNOSIS — Z603 Acculturation difficulty: Secondary | ICD-10-CM | POA: Diagnosis present

## 2020-12-26 DIAGNOSIS — Z79899 Other long term (current) drug therapy: Secondary | ICD-10-CM

## 2020-12-26 DIAGNOSIS — R6251 Failure to thrive (child): Secondary | ICD-10-CM | POA: Diagnosis present

## 2020-12-26 DIAGNOSIS — R7303 Prediabetes: Secondary | ICD-10-CM | POA: Diagnosis present

## 2020-12-26 DIAGNOSIS — G8929 Other chronic pain: Secondary | ICD-10-CM | POA: Diagnosis present

## 2020-12-26 DIAGNOSIS — E785 Hyperlipidemia, unspecified: Secondary | ICD-10-CM | POA: Diagnosis present

## 2020-12-26 DIAGNOSIS — I69991 Dysphagia following unspecified cerebrovascular disease: Secondary | ICD-10-CM

## 2020-12-26 DIAGNOSIS — R7989 Other specified abnormal findings of blood chemistry: Secondary | ICD-10-CM | POA: Diagnosis present

## 2020-12-26 DIAGNOSIS — R531 Weakness: Secondary | ICD-10-CM | POA: Diagnosis not present

## 2020-12-26 DIAGNOSIS — R1310 Dysphagia, unspecified: Secondary | ICD-10-CM

## 2020-12-26 LAB — COMPREHENSIVE METABOLIC PANEL
ALT: 22 U/L (ref 0–44)
AST: 56 U/L — ABNORMAL HIGH (ref 15–41)
Albumin: 2.9 g/dL — ABNORMAL LOW (ref 3.5–5.0)
Alkaline Phosphatase: 58 U/L (ref 38–126)
Anion gap: 12 (ref 5–15)
BUN: 18 mg/dL (ref 8–23)
CO2: 21 mmol/L — ABNORMAL LOW (ref 22–32)
Calcium: 8.1 mg/dL — ABNORMAL LOW (ref 8.9–10.3)
Chloride: 94 mmol/L — ABNORMAL LOW (ref 98–111)
Creatinine, Ser: 0.98 mg/dL (ref 0.44–1.00)
GFR, Estimated: 53 mL/min — ABNORMAL LOW (ref >60)
Glucose, Bld: 91 mg/dL (ref 70–99)
Potassium: 3.9 mmol/L (ref 3.5–5.1)
Sodium: 127 mmol/L — ABNORMAL LOW (ref 135–145)
Total Bilirubin: 1.8 mg/dL — ABNORMAL HIGH (ref 0.3–1.2)
Total Protein: 7.2 g/dL (ref 6.5–8.1)

## 2020-12-26 LAB — CBC WITH DIFFERENTIAL/PLATELET
Abs Immature Granulocytes: 0.04 10*3/uL (ref 0.00–0.07)
Basophils Absolute: 0 10*3/uL (ref 0.0–0.1)
Basophils Relative: 0 %
Eosinophils Absolute: 0 10*3/uL (ref 0.0–0.5)
Eosinophils Relative: 0 %
HCT: 38.1 % (ref 36.0–46.0)
Hemoglobin: 12.5 g/dL (ref 12.0–15.0)
Immature Granulocytes: 1 %
Lymphocytes Relative: 17 %
Lymphs Abs: 1.1 10*3/uL (ref 0.7–4.0)
MCH: 26.2 pg (ref 26.0–34.0)
MCHC: 32.8 g/dL (ref 30.0–36.0)
MCV: 79.7 fL — ABNORMAL LOW (ref 80.0–100.0)
Monocytes Absolute: 0.8 10*3/uL (ref 0.1–1.0)
Monocytes Relative: 12 %
Neutro Abs: 4.4 10*3/uL (ref 1.7–7.7)
Neutrophils Relative %: 70 %
Platelets: 211 10*3/uL (ref 150–400)
RBC: 4.78 MIL/uL (ref 3.87–5.11)
RDW: 13.5 % (ref 11.5–15.5)
WBC: 6.4 10*3/uL (ref 4.0–10.5)
nRBC: 0 % (ref 0.0–0.2)

## 2020-12-26 LAB — LACTATE DEHYDROGENASE: LDH: 249 U/L — ABNORMAL HIGH (ref 98–192)

## 2020-12-26 LAB — D-DIMER, QUANTITATIVE: D-Dimer, Quant: 1.6 ug/mL-FEU — ABNORMAL HIGH (ref 0.00–0.50)

## 2020-12-26 LAB — SARS CORONAVIRUS 2 BY RT PCR (HOSPITAL ORDER, PERFORMED IN ~~LOC~~ HOSPITAL LAB): SARS Coronavirus 2: POSITIVE — AB

## 2020-12-26 LAB — FERRITIN: Ferritin: 944 ng/mL — ABNORMAL HIGH (ref 11–307)

## 2020-12-26 LAB — LACTIC ACID, PLASMA: Lactic Acid, Venous: 1.3 mmol/L (ref 0.5–1.9)

## 2020-12-26 LAB — PROCALCITONIN: Procalcitonin: 0.38 ng/mL

## 2020-12-26 LAB — C-REACTIVE PROTEIN: CRP: 13.6 mg/dL — ABNORMAL HIGH (ref <1.0)

## 2020-12-26 LAB — FIBRINOGEN: Fibrinogen: 520 mg/dL — ABNORMAL HIGH (ref 210–475)

## 2020-12-26 LAB — TRIGLYCERIDES: Triglycerides: 162 mg/dL — ABNORMAL HIGH (ref <150)

## 2020-12-26 MED ORDER — SODIUM CHLORIDE 0.9 % IV SOLN
500.0000 mg | Freq: Once | INTRAVENOUS | Status: AC
  Administered 2020-12-26: 500 mg via INTRAVENOUS
  Filled 2020-12-26: qty 500

## 2020-12-26 MED ORDER — ENOXAPARIN SODIUM 40 MG/0.4ML ~~LOC~~ SOLN
40.0000 mg | SUBCUTANEOUS | Status: DC
  Administered 2020-12-27 – 2021-01-04 (×9): 40 mg via SUBCUTANEOUS
  Filled 2020-12-26 (×9): qty 0.4

## 2020-12-26 MED ORDER — DEXAMETHASONE SODIUM PHOSPHATE 10 MG/ML IJ SOLN: 6.0000 mg | INTRAMUSCULAR | Status: DC

## 2020-12-26 MED ORDER — SODIUM CHLORIDE 0.9 % IV SOLN
2.0000 g | INTRAVENOUS | Status: AC
  Administered 2020-12-27 – 2020-12-31 (×5): 2 g via INTRAVENOUS
  Filled 2020-12-26: qty 2
  Filled 2020-12-26: qty 20
  Filled 2020-12-26 (×2): qty 2
  Filled 2020-12-26 (×2): qty 20

## 2020-12-26 MED ORDER — IOHEXOL 350 MG/ML SOLN
100.0000 mL | Freq: Once | INTRAVENOUS | Status: AC | PRN
  Administered 2020-12-27: 75 mL via INTRAVENOUS

## 2020-12-26 MED ORDER — SODIUM CHLORIDE 0.9 % IV SOLN: INTRAVENOUS | Status: DC

## 2020-12-26 MED ORDER — SODIUM CHLORIDE 0.9 % IV SOLN: 2.0000 g | INTRAVENOUS | Status: DC

## 2020-12-26 MED ORDER — METHYLPREDNISOLONE SODIUM SUCC 125 MG IJ SOLR
70.0000 mg | Freq: Once | INTRAMUSCULAR | Status: AC
  Administered 2020-12-26: 70 mg via INTRAVENOUS
  Filled 2020-12-26: qty 2

## 2020-12-26 MED ORDER — SODIUM CHLORIDE 0.9 % IV SOLN
1.0000 g | Freq: Once | INTRAVENOUS | Status: AC
  Administered 2020-12-26: 1 g via INTRAVENOUS
  Filled 2020-12-26: qty 10

## 2020-12-26 MED ORDER — SODIUM CHLORIDE 0.9 % IV SOLN: 500.0000 mg | INTRAVENOUS | Status: DC

## 2020-12-26 MED ORDER — SODIUM CHLORIDE 0.9 % IV SOLN
500.0000 mg | INTRAVENOUS | Status: AC
  Administered 2020-12-27 – 2020-12-31 (×5): 500 mg via INTRAVENOUS
  Filled 2020-12-26 (×6): qty 500

## 2020-12-26 NOTE — ED Provider Notes (Signed)
[facility]-EMERGENCY DEPT Provider Note   CSN: [id number] Arrival date & time: 01-21-21  2100     History Chief Complaint  Patient presents with  . Shortness of Breath    Ann Coleman is a 87 y.o. female.  She you have prior history of stroke.  [name] speaking.  EMS states that they were called for increased shortness of breath and weakness.  She has been sick for about 3 days.  Son-in-law has Covid and is admitted.  Room air sats 78% on room air.  Placed on nonrebreather.Not vaccinated. Attempted to [place]ian interpreter via ipad without success, ? Patient confusion  The history is provided by the patient, the EMS personnel and a relative.  Shortness of Breath Severity:  Moderate Onset quality:  Gradual Duration:  1 week Timing:  Constant Progression:  Worsening Chronicity:  New Relieved by:  Nothing Worsened by:  Activity Ineffective treatments:  None tried Associated symptoms: cough and fever   Associated symptoms: no abdominal pain, no chest pain, no neck pain, no rash and no sore throat        Past Medical History:  Diagnosis Date  . High cholesterol   . Hypertension   . Stroke [facility])     Patient Active Problem List   Diagnosis Date Noted  . Morbid obesity (HCC)   . Prediabetes   . Dyspnea   . Diastolic dysfunction   . Wheezing   . Elevated blood pressure reading   . CKD (chronic kidney disease), stage II   . Chronic pain of both knees   . Right hemiparesis (HCC)   . AKI (acute kidney injury) (HCC)   . Chest pressure   . Essential hypertension   . Labile blood pressure   . Dysphagia, post-stroke   . Brainstem infarct, acute (HCC) 06/02/2020  . Left pontine cerebrovascular accident (HCC) 06/02/2020  . Acute CVA (cerebrovascular accident) (HCC) 05/29/2020  . HLD (hyperlipidemia) 05/29/2020  . Lacunar infarction (HCC) 05/10/2017  . Stroke (HCC) 03/05/2017  . Acute right MCA stroke (HCC) 03/04/2017  . Hypertensive urgency 03/04/2017   . Stroke (cerebrum) (HCC) 03/04/2017    Past Surgical History:  Procedure Laterality Date  . NO PAST SURGERIES       OB History   No obstetric history on file.     Family History  Problem Relation Age of Onset  . Stroke Neg Hx   . Diabetes Mellitus II Neg Hx   . Hypertension Neg Hx     Social History   Tobacco Use  . Smoking status: Never Smoker  . Smokeless tobacco: Never Used  Vaping Use  . Vaping Use: Never used  Substance Use Topics  . Alcohol use: No  . Drug use: No    Home Medications Prior to Admission medications   Medication Sig Start Date End Date Taking? Authorizing Provider  acetaminophen (TYLENOL) 325 MG tablet Take 2 tablets (650 mg total) by mouth every 6 (six) hours. 06/21/20   Angiulli, Mcarthur Rossetti, PA-C  aspirin EC 81 MG EC tablet Take 1 tablet (81 mg total) by mouth daily. Swallow whole. 06/03/20   Jerald Kief, MD  atorvastatin (LIPITOR) 40 MG tablet Take 1 tablet (40 mg total) by mouth daily. 06/21/20   Angiulli, Mcarthur Rossetti, PA-C  diclofenac Sodium (VOLTAREN) 1 % GEL Apply 2 g topically 4 (four) times daily. 06/21/20   Angiulli, Mcarthur Rossetti, PA-C  lidocaine (LIDODERM) 5 % Place 2 patches onto the skin daily. Remove & Discard patch  within 12 hours or as directed by MD 06/21/20   Angiulli, Mcarthur Rossetti, PA-C  lisinopril (ZESTRIL) 10 MG tablet Take 1 tablet (10 mg total) by mouth daily. 06/21/20   Angiulli, Mcarthur Rossetti, PA-C    Allergies    Patient has no known allergies.  Review of Systems   Review of Systems  Constitutional: Positive for fatigue and fever.  HENT: Negative for sore throat.   Eyes: Negative for visual disturbance.  Respiratory: Positive for cough and shortness of breath.   Cardiovascular: Negative for chest pain.  Gastrointestinal: Negative for abdominal pain.  Genitourinary: Negative for dysuria.  Musculoskeletal: Negative for neck pain.  Skin: Negative for rash.  Neurological: Positive for weakness.    Physical Exam Updated Vital  Signs BP (!) 121/96   Pulse 92   Temp 99 F (37.2 C) (Oral)   Resp 20   SpO2 94%   Physical Exam Vitals and nursing note reviewed.  Constitutional:      General: She is not in acute distress.    Appearance: She is well-developed and well-nourished.  HENT:     Head: Normocephalic and atraumatic.  Eyes:     Conjunctiva/sclera: Conjunctivae normal.  Cardiovascular:     Rate and Rhythm: Normal rate and regular rhythm.     Heart sounds: No murmur heard.   Pulmonary:     Effort: Pulmonary effort is normal. Tachypnea present. No respiratory distress.     Breath sounds: Rhonchi (scattered) present.  Abdominal:     Palpations: Abdomen is soft.     Tenderness: There is no abdominal tenderness.  Musculoskeletal:        General: No edema. Normal range of motion.     Cervical back: Neck supple.     Right lower leg: No tenderness. No edema.     Left lower leg: No tenderness. No edema.  Skin:    General: Skin is warm and dry.     Capillary Refill: Capillary refill takes less than 2 seconds.  Neurological:     General: No focal deficit present.     Mental Status: She is alert.  Psychiatric:        Mood and Affect: Mood and affect normal.     ED Results / Procedures / Treatments   Labs (all labs ordered are listed, but only abnormal results are displayed) Labs Reviewed  CBC WITH DIFFERENTIAL/PLATELET - Abnormal; Notable for the following components:      Result Value   MCV 79.7 (*)    All other components within normal limits  COMPREHENSIVE METABOLIC PANEL - Abnormal; Notable for the following components:   Sodium 127 (*)    Chloride 94 (*)    CO2 21 (*)    Calcium 8.1 (*)    Albumin 2.9 (*)    AST 56 (*)    Total Bilirubin 1.8 (*)    GFR, Estimated 53 (*)    All other components within normal limits  D-DIMER, QUANTITATIVE (NOT AT [facility]) - Abnormal; Notable for the following components:   D-Dimer, Quant 1.60 (*)    All other components within normal limits  LACTATE  DEHYDROGENASE - Abnormal; Notable for the following components:   LDH 249 (*)    All other components within normal limits  TRIGLYCERIDES - Abnormal; Notable for the following components:   Triglycerides 162 (*)    All other components within normal limits  FIBRINOGEN - Abnormal; Notable for the following components:   Fibrinogen 520 (*)    All  other components within normal limits  SARS CORONAVIRUS 2 BY RT PCR (HOSPITAL ORDER, PERFORMED IN [facility])  CULTURE, BLOOD (ROUTINE X 2)  CULTURE, BLOOD (ROUTINE X 2)  LACTIC ACID, PLASMA  LACTIC ACID, PLASMA  PROCALCITONIN  FERRITIN  C-REACTIVE PROTEIN    EKG EKG Interpretation  Date/Time:  Wednesday December 26 2020 21:55:22 EST Ventricular Rate:  95 PR Interval:    QRS Duration: 70 QT Interval:  376 QTC Calculation: 473 R Axis:   38 Text Interpretation: Sinus rhythm Ventricular premature complex Aberrant complex Abnormal R-wave progression, early transition Baseline wander in lead(s) V1 No significant change since prior 7/21 Confirmed by Meridee Score [phone]) on 12/31/2020 9:57:14 PM   Radiology DG Chest Port 1 View  Result Date: 12/20/2020 CLINICAL DATA:  COVID, shortness of breath EXAM: PORTABLE CHEST 1 VIEW COMPARISON:  06/19/2020 FINDINGS: Heart is borderline enlarged. Tortuous, enlarged thoracic aorta which appears aneurysmal, similar to prior study. Patchy bilateral airspace opacities. No effusions or acute bony abnormality. IMPRESSION: Patchy bilateral airspace disease concerning for pneumonia. Tortuous, dilated/aneurysmal thoracic aorta, similar to prior study. Electronically Signed   By: Charlett Nose M.D.   On: 12/18/2020 22:10    Procedures .Critical Care Performed by: Terrilee Files, MD Authorized by: Terrilee Files, MD   Critical care provider statement:    Critical care time (minutes):  45   Critical care time was exclusive of:  Separately billable procedures and treating other patients    Critical care was necessary to treat or prevent imminent or life-threatening deterioration of the following conditions:  Respiratory failure   Critical care was time spent personally by me on the following activities:  Discussions with consultants, evaluation of patient's response to treatment, examination of patient, ordering and performing treatments and interventions, ordering and review of laboratory studies, ordering and review of radiographic studies, pulse oximetry, re-evaluation of patient's condition, obtaining history from patient or surrogate, review of old charts and development of treatment plan with patient or surrogate     Medications Ordered in ED Medications  cefTRIAXone (ROCEPHIN) 1 g in sodium chloride 0.9 % 100 mL IVPB (has no administration in time range)  azithromycin (ZITHROMAX) 500 mg in sodium chloride 0.9 % 250 mL IVPB (has no administration in time range)  methylPREDNISolone sodium succinate (SOLU-MEDROL) 125 mg/2 mL injection 70 mg (70 mg Intravenous Given 12/31/2020 2209)    ED Course  I have reviewed the triage vital signs and the nursing notes.  Pertinent labs & imaging results that were available during my care of the patient were reviewed by me and considered in my medical decision making (see chart for details).  Clinical Course as of 12/27/20 1037  Wed Dec 26, 2020  2202 I initially called the patient's daughter but there was no answer.  I left a message.  She called back.  Has been sick for 3 days.  Short of breath and feeling tired.  Did not eat or drink today.  Son-in-law is admitted with Covid.  Patient has not been vaccinated [MB]  2216 Chest x-ray interpreted by me as right-sided infiltrate. [MB]  2315 Discussed with Dr. Mikeal Hawthorne [facility] hospitalist who will evaluate the patient for admission.  He asked if we can order a CT PE study [MB]    Clinical Course User Index [MB] Terrilee Files, MD   MDM Rules/Calculators/A&P  Aqua Hedgepeth was  evaluated in Emergency Department on 12/25/2020 for the symptoms described in the history of present illness. She was evaluated in the context of the global COVID-19 pandemic, which necessitated consideration that the patient might be at risk for infection with the SARS-CoV-2 virus that causes COVID-19. Institutional protocols and algorithms that pertain to the evaluation of patients at risk for COVID-19 are in a state of rapid change based on information released by regulatory bodies including the CDC and federal and state organizations. These policies and algorithms were followed during the patient's care in the ED.  This patient complains of increased shortness of breath poor p.o. intake; this involves an extensive number of treatment Options and is a complaint that carries with it a high risk of complications and Morbidity. The differential includes COVID, pneumonia, metabolic derangement, pneumothorax, PE  I ordered, reviewed and interpreted labs, which included CBC with normal white count normal hemoglobin, chemistries with low sodium elevated creatinine reflecting some dehydration, mild elevation of LFTs, laboratory markers elevated, lactic acid normal I ordered medication IV antibiotics, IV steroids, IV fluids I ordered imaging studies which included chest x-ray and I independently    visualized and interpreted imaging which showed right-sided infiltrate Additional history obtained from patient's daughter Previous records obtained and reviewed in epic, no recent admissions I consulted Dr. Mikeal Hawthorne [facility] and discussed lab and imaging findings  Critical Interventions: Management of patient's acute hypoxic respiratory failure with oxygen IV antibiotics and testing for Covid and treatment with steroids  After the interventions stated above, I reevaluated the patient and found patient still to be requiring oxygen.  Need admission for further work-up of patient's hypoxia.  Anticipate is  probably Covid positive and will need further treatment.   Final Clinical Impression(s) / ED Diagnoses Final diagnoses:  Acute respiratory failure with hypoxia (HCC)  Pneumonia of right lower lobe due to infectious organism  Hyponatremia  Person under investigation for COVID-19    Rx / DC Orders ED Discharge Orders    None       Terrilee Files, MD 12/27/20 1042

## 2020-12-26 NOTE — ED Triage Notes (Signed)
Per EMS, pt from home, has had SOB and weakness starting tonight. Pt husband has covid, but pt has not been tested.   HR 100 BP 140/100 SpO2 70% on RA, 95% on NRB 15L

## 2020-12-26 NOTE — H&P (Signed)
History and Physical   Ann Coleman VHQ:[id number] DOB: 06-20-1924 DOA: 01/24/21  Referring MD/NP/PA: Dr. Charm Barges  PCP: Patient, No Pcp Per   Outpatient Specialists: None  Patient coming from: Home  Chief Complaint: Hypoxia  HPI: Ann Coleman is a 87 y.o. female with medical history significant of multiple episode of CVA, essential hypertension, diastolic dysfunction, hyperlipidemia, chronic bilateral lower extremity pain who is limited by language speaks [name] but is known to have Covid exposure at home with family members positive with Covid. Patient was having significant weakness or shortness of breath. When EMS arrived she was found to be hypoxic. Patient brought to the ER where she is evaluated. She appears to have bilateral pneumonia. COVID-19 test result is currently pending but patient suspected to have COVID-19 pneumonia. Also suspected community-acquired pneumonia. She is being admitted as a PUI pending to her test results. She is currently requiring 2 L of oxygen in the ER..  ED Course: Temperature is 99 blood pressure 120/96 pulse 97 respirate 26 oxygen sats currently 94% on 2 L. CBC largely within normal sodium 127 potassium 3.9 chloride 94 CO2 21 BUN 18 creatinine 0.98 and calcium 8.1. Chest x-ray showed patchy bilateral airspace disease concerning for pneumonia. LDH is 249 triglyceride 162 lactic acid 1.3 procalcitonin 0.38. D-dimer is 1.60 with fibrinogen 520. Blood cultures obtained and patient being admitted with pneumonia most likely COVID-19 pneumonia.  Review of Systems: As per HPI otherwise 10 point review of systems negative.    Past Medical History:  Diagnosis Date  . High cholesterol   . Hypertension   . Stroke [facility])     Past Surgical History:  Procedure Laterality Date  . NO PAST SURGERIES       reports that she has never smoked. She has never used smokeless tobacco. She reports that she does not drink alcohol and does not use drugs.  No Known  Allergies  Family History  Problem Relation Age of Onset  . Stroke Neg Hx   . Diabetes Mellitus II Neg Hx   . Hypertension Neg Hx      Prior to Admission medications   Medication Sig Start Date End Date Taking? Authorizing Provider  acetaminophen (TYLENOL) 325 MG tablet Take 2 tablets (650 mg total) by mouth every 6 (six) hours. 06/21/20   Angiulli, Mcarthur Rossetti, PA-C  aspirin EC 81 MG EC tablet Take 1 tablet (81 mg total) by mouth daily. Swallow whole. 06/03/20   Jerald Kief, MD  atorvastatin (LIPITOR) 40 MG tablet Take 1 tablet (40 mg total) by mouth daily. 06/21/20   Angiulli, Mcarthur Rossetti, PA-C  diclofenac Sodium (VOLTAREN) 1 % GEL Apply 2 g topically 4 (four) times daily. 06/21/20   Angiulli, Mcarthur Rossetti, PA-C  lidocaine (LIDODERM) 5 % Place 2 patches onto the skin daily. Remove & Discard patch within 12 hours or as directed by MD 06/21/20   Angiulli, Mcarthur Rossetti, PA-C  lisinopril (ZESTRIL) 10 MG tablet Take 1 tablet (10 mg total) by mouth daily. 06/21/20   Charlton Amor, PA-C    Physical Exam: Vitals:   24-Jan-2021 2128 01-24-2021 2245  BP: 111/72 (!) 121/96  Pulse: 97 92  Resp: (!) 26 20  Temp: 99 F (37.2 C)   TempSrc: Oral   SpO2: 93% 94%      Constitutional: Chronically ill looking, frail, noncommunicative Vitals:   2021/01/24 2128 01-24-2021 2245  BP: 111/72 (!) 121/96  Pulse: 97 92  Resp: (!) 26 20  Temp: 99 F (37.2 C)  TempSrc: Oral   SpO2: 93% 94%   Eyes: PERRL, lids and conjunctivae normal ENMT: Mucous membranes are dry. Posterior pharynx clear of any exudate or lesions.Normal dentition.  Neck: normal, supple, no masses, no thyromegaly Respiratory: Decreased air entry bilaterally with some coarse breath sounds, no wheeze, some mild crackles and rales. Normal respiratory effort. No accessory muscle use.  Cardiovascular: Regular rate and rhythm, no murmurs / rubs / gallops. No extremity edema. 2+ pedal pulses. No carotid bruits.  Abdomen: no tenderness, no masses palpated.  No hepatosplenomegaly. Bowel sounds positive.  Musculoskeletal: no clubbing / cyanosis. No joint deformity upper and lower extremities. Good ROM, no contractures. Normal muscle tone.  Skin: no rashes, lesions, ulcers. No induration Neurologic: CN 2-12 grossly intact. Sensation intact, DTR normal. Strength 5/5 in all 4.  Psychiatric: Poor communication with depressed mood    Labs on Admission: I have personally reviewed following labs and imaging studies  CBC: Recent Labs  Lab 12/18/2020 2154  WBC 6.4  NEUTROABS 4.4  HGB 12.5  HCT 38.1  MCV 79.7*  PLT 211   Basic Metabolic Panel: Recent Labs  Lab 12/03/2020 2154  NA 127*  K 3.9  CL 94*  CO2 21*  GLUCOSE 91  BUN 18  CREATININE 0.98  CALCIUM 8.1*   GFR: CrCl cannot be calculated (Unknown ideal weight.). Liver Function Tests: Recent Labs  Lab 12/20/2020 2154  AST 56*  ALT 22  ALKPHOS 58  BILITOT 1.8*  PROT 7.2  ALBUMIN 2.9*   No results for input(s): LIPASE, AMYLASE in the last 168 hours. No results for input(s): AMMONIA in the last 168 hours. Coagulation Profile: No results for input(s): INR, PROTIME in the last 168 hours. Cardiac Enzymes: No results for input(s): CKTOTAL, CKMB, CKMBINDEX, TROPONINI in the last 168 hours. BNP (last 3 results) No results for input(s): PROBNP in the last 8760 hours. HbA1C: No results for input(s): HGBA1C in the last 72 hours. CBG: No results for input(s): GLUCAP in the last 168 hours. Lipid Profile: Recent Labs    12/28/2020 2154  TRIG 162*   Thyroid Function Tests: No results for input(s): TSH, T4TOTAL, FREET4, T3FREE, THYROIDAB in the last 72 hours. Anemia Panel: No results for input(s): VITAMINB12, FOLATE, FERRITIN, TIBC, IRON, RETICCTPCT in the last 72 hours. Urine analysis:    Component Value Date/Time   COLORURINE STRAW (A) 03/04/2017 1230   APPEARANCEUR CLEAR 03/04/2017 1230   LABSPEC 1.005 03/04/2017 1230   PHURINE 7.0 03/04/2017 1230   GLUCOSEU NEGATIVE  03/04/2017 1230   HGBUR NEGATIVE 03/04/2017 1230   BILIRUBINUR NEGATIVE 03/04/2017 1230   KETONESUR NEGATIVE 03/04/2017 1230   PROTEINUR NEGATIVE 03/04/2017 1230   NITRITE NEGATIVE 03/04/2017 1230   LEUKOCYTESUR NEGATIVE 03/04/2017 1230   Sepsis Labs: @LABRCNTIP (procalcitonin:4,lacticidven:4) )No results found for this or any previous visit (from the past 240 hour(s)).   Radiological Exams on Admission: DG Chest Port 1 View  Result Date: 12/29/2020 CLINICAL DATA:  COVID, shortness of breath EXAM: PORTABLE CHEST 1 VIEW COMPARISON:  06/19/2020 FINDINGS: Heart is borderline enlarged. Tortuous, enlarged thoracic aorta which appears aneurysmal, similar to prior study. Patchy bilateral airspace opacities. No effusions or acute bony abnormality. IMPRESSION: Patchy bilateral airspace disease concerning for pneumonia. Tortuous, dilated/aneurysmal thoracic aorta, similar to prior study. Electronically Signed   By: 06/21/2020 M.D.   On: 12/06/2020 22:10      Assessment/Plan Principal Problem:   CAP (community acquired pneumonia) Active Problems:   HLD (hyperlipidemia)   Essential hypertension   CKD (  chronic kidney disease), stage II   Diastolic dysfunction     #1 multifocal pneumonia most likely Covid pneumonia: Patient will be admitted with community-acquired pneumonia and initiate treatment for pneumonia. We will await her COVID-19 screening. If confirmed we will initiate remdesivir and other COVID-19 medications. In the meantime I will initiate steroids with antibiotics and breathing treatments. Get CT angiogram of the chest to rule out PE as well due to elevated D-dimer.  #2 history of CVA: Residual dysphagia. Monitor patient. May need speech evaluation to rule out aspiration pneumonia if COVID-19 is negative.  #3 essential hypertension: We will continue with home regimen blood pressure appears controlled.  #4 chronic kidney disease stage II: Appears stable close to baseline. Continue  to monitor.  #5 diastolic dysfunction: Appears well compensated.  #6 hyperlipidemia: Continue home regimen   DVT prophylaxis: Lovenox Code Status: Full Family Communication: Limited conversation over the phone with family Disposition Plan: Home Consults called: None Admission status: Inpatient  Severity of Illness: The appropriate patient status for this patient is INPATIENT. Inpatient status is judged to be reasonable and necessary in order to provide the required intensity of service to ensure the patient's safety. The patient's presenting symptoms, physical exam findings, and initial radiographic and laboratory data in the context of their chronic comorbidities is felt to place them at high risk for further clinical deterioration. Furthermore, it is not anticipated that the patient will be medically stable for discharge from the hospital within 2 midnights of admission. The following factors support the patient status of inpatient.   " The patient's presenting symptoms include shortness of breath and hypoxia. " The worrisome physical exam findings include chronically ill looking in mild distress. " The initial radiographic and laboratory data are worrisome because of bilateral pneumonia. " The chronic co-morbidities include hypertension.   * I certify that at the point of admission it is my clinical judgment that the patient will require inpatient hospital care spanning beyond 2 midnights from the point of admission due to high intensity of service, high risk for further deterioration and high frequency of surveillance required.Lonia Blood MD [facility] Pager [phone]  If 7PM-7AM, please contact night-coverage [email or web address] Password [facility]  12/27/2020, 11:22 PM

## 2020-12-26 NOTE — ED Notes (Signed)
Patient transported to CT 

## 2020-12-27 DIAGNOSIS — U071 COVID-19: Secondary | ICD-10-CM | POA: Diagnosis not present

## 2020-12-27 DIAGNOSIS — J1282 Pneumonia due to coronavirus disease 2019: Secondary | ICD-10-CM

## 2020-12-27 LAB — COMPREHENSIVE METABOLIC PANEL
ALT: 21 U/L (ref 0–44)
AST: 55 U/L — ABNORMAL HIGH (ref 15–41)
Albumin: 2.8 g/dL — ABNORMAL LOW (ref 3.5–5.0)
Alkaline Phosphatase: 54 U/L (ref 38–126)
Anion gap: 9 (ref 5–15)
BUN: 16 mg/dL (ref 8–23)
CO2: 23 mmol/L (ref 22–32)
Calcium: 7.8 mg/dL — ABNORMAL LOW (ref 8.9–10.3)
Chloride: 95 mmol/L — ABNORMAL LOW (ref 98–111)
Creatinine, Ser: 1.01 mg/dL — ABNORMAL HIGH (ref 0.44–1.00)
GFR, Estimated: 52 mL/min — ABNORMAL LOW (ref >60)
Glucose, Bld: 119 mg/dL — ABNORMAL HIGH (ref 70–99)
Potassium: 4.8 mmol/L (ref 3.5–5.1)
Sodium: 127 mmol/L — ABNORMAL LOW (ref 135–145)
Total Bilirubin: 1.3 mg/dL — ABNORMAL HIGH (ref 0.3–1.2)
Total Protein: 7 g/dL (ref 6.5–8.1)

## 2020-12-27 LAB — CBC WITH DIFFERENTIAL/PLATELET
Abs Immature Granulocytes: 0.03 10*3/uL (ref 0.00–0.07)
Basophils Absolute: 0 10*3/uL (ref 0.0–0.1)
Basophils Relative: 0 %
Eosinophils Absolute: 0 10*3/uL (ref 0.0–0.5)
Eosinophils Relative: 0 %
HCT: 37.9 % (ref 36.0–46.0)
Hemoglobin: 12.4 g/dL (ref 12.0–15.0)
Immature Granulocytes: 1 %
Lymphocytes Relative: 10 %
Lymphs Abs: 0.6 10*3/uL — ABNORMAL LOW (ref 0.7–4.0)
MCH: 26.5 pg (ref 26.0–34.0)
MCHC: 32.7 g/dL (ref 30.0–36.0)
MCV: 81 fL (ref 80.0–100.0)
Monocytes Absolute: 0.2 10*3/uL (ref 0.1–1.0)
Monocytes Relative: 4 %
Neutro Abs: 4.9 10*3/uL (ref 1.7–7.7)
Neutrophils Relative %: 85 %
Platelets: 205 10*3/uL (ref 150–400)
RBC: 4.68 MIL/uL (ref 3.87–5.11)
RDW: 13.9 % (ref 11.5–15.5)
WBC: 5.7 10*3/uL (ref 4.0–10.5)
nRBC: 0 % (ref 0.0–0.2)

## 2020-12-27 LAB — HIV ANTIBODY (ROUTINE TESTING W REFLEX): HIV Screen 4th Generation wRfx: NONREACTIVE

## 2020-12-27 LAB — C-REACTIVE PROTEIN: CRP: 13.2 mg/dL — ABNORMAL HIGH (ref <1.0)

## 2020-12-27 LAB — STREP PNEUMONIAE URINARY ANTIGEN: Strep Pneumo Urinary Antigen: NEGATIVE

## 2020-12-27 LAB — D-DIMER, QUANTITATIVE: D-Dimer, Quant: 1.52 ug/mL-FEU — ABNORMAL HIGH (ref 0.00–0.50)

## 2020-12-27 LAB — LACTIC ACID, PLASMA: Lactic Acid, Venous: 1 mmol/L (ref 0.5–1.9)

## 2020-12-27 MED ORDER — ACETAMINOPHEN 325 MG PO TABS
650.0000 mg | ORAL_TABLET | Freq: Once | ORAL | Status: AC
  Administered 2020-12-27: 650 mg via ORAL
  Filled 2020-12-27: qty 2

## 2020-12-27 MED ORDER — BARICITINIB 2 MG PO TABS
2.0000 mg | ORAL_TABLET | Freq: Every day | ORAL | Status: DC
  Administered 2020-12-27 – 2020-12-28 (×2): 2 mg via ORAL
  Filled 2020-12-27 (×2): qty 1

## 2020-12-27 MED ORDER — SODIUM CHLORIDE 0.9 % IV SOLN
100.0000 mg | INTRAVENOUS | Status: AC
  Administered 2020-12-28 – 2020-12-31 (×4): 100 mg via INTRAVENOUS
  Filled 2020-12-27 (×4): qty 20

## 2020-12-27 MED ORDER — METHYLPREDNISOLONE SODIUM SUCC 40 MG IJ SOLR
40.0000 mg | Freq: Two times a day (BID) | INTRAMUSCULAR | Status: DC
  Administered 2020-12-27 – 2020-12-30 (×8): 40 mg via INTRAVENOUS
  Administered 2020-12-31: 80 mg via INTRAVENOUS
  Administered 2020-12-31 – 2021-01-07 (×14): 40 mg via INTRAVENOUS
  Filled 2020-12-27 (×23): qty 1

## 2020-12-27 MED ORDER — SODIUM CHLORIDE 0.9 % IV SOLN
200.0000 mg | Freq: Once | INTRAVENOUS | Status: AC
  Administered 2020-12-27: 200 mg via INTRAVENOUS
  Filled 2020-12-27: qty 200

## 2020-12-27 NOTE — Progress Notes (Signed)
Patient has a headache. Notified the PCP on call.

## 2020-12-27 NOTE — Progress Notes (Signed)
PROGRESS NOTE    Ann Coleman  IRS:[id number] DOB: [birth year] DOA: 12/06/2020 PCP: Patient, No Pcp Per     Brief Narrative:  Ann Coleman is a [place]ian-speaking 87 y.o. female with medical history significant of multiple episode of CVA, essential hypertension, diastolic dysfunction, hyperlipidemia, chronic bilateral lower extremity pain who is known to have Covid exposure at home with family members positive with Covid. Patient was having significant weakness or shortness of breath. When EMS arrived she was found to be hypoxic. Patient brought to the ER where she is evaluated. She appears to have bilateral pneumonia.  She initially required 2 L of oxygen in the emergency department.  Covid test resulted positive.  New events last 24 hours / Subjective: Patient seen in the emergency department, interpreter not available.  Per ED RN, [name] interpreter was not able to assist with translation earlier today as they could not understand patient.   Assessment & Plan:   Principal Problem:   CAP (community acquired pneumonia) Active Problems:   HLD (hyperlipidemia)   Essential hypertension   CKD (chronic kidney disease), stage II   Diastolic dysfunction   Acute hypoxemic respiratory failure secondary to COVID-19  -In the emergency department, SPO2 70% on room air and required nonrebreather at 15 L, continue to wean as able -CTA negative for PE  COVID-19 Pneumonia -Tested positive on 1/26 -Continue Remdesivir, discussed benefits and risks with family and they are agreeable to starting this medication.  Started 1/27 >> -Continue steroids 1/26 >>  -Continue baricitinib, discussed benefits and risks with family and they are agreeable to starting this medication.  Received verbal consent.  Started 1/27 >>  -Also continue Rocephin and azithromycin due to elevated procalcitonin, initial concern for bacterial  -Supportive treatments as ordered, encourage IS, encourage prone positioning, encourage  mobilization   COVID-19 Labs  Recent Labs    12/08/2020 2154 12/27/20 1029  DDIMER 1.60* 1.52*  FERRITIN 944*  --   LDH 249*  --   CRP 13.6* 13.2*    Lab Results  Component Value Date   SARSCOV2NAA POSITIVE (A) 12/21/2020   SARSCOV2NAA NEGATIVE 05/29/2020    CKD stage IIIa -Continue to monitor BMP  History of CVA  -Residual dysphagia  Left thyroid nodule -Recommend thyroid ultrasound for work-up as an outpatient    DVT prophylaxis:  enoxaparin (LOVENOX) injection 40 mg Start: 12/27/20 1000  Code Status: DNR, confirmed with 2 daughters over the phone they desire natural death in case of significant decompensation Family Communication: No family at bedside, discussed over the phone with 2 daughters Disposition Plan:  Status is: Inpatient  Remains inpatient appropriate because:Hemodynamically unstable   Dispo: The patient is from: Home              Anticipated d/c is to: To be determined              Anticipated d/c date is: > 3 days              Patient currently is not medically stable to d/c.   Difficult to place patient No   Antimicrobials:  Anti-infectives (From admission, onward)   Start     Dose/Rate Route Frequency Ordered Stop   12/28/20 1000  remdesivir 100 mg in sodium chloride 0.9 % 100 mL IVPB       "Followed by" Linked Group Details   100 mg 200 mL/hr over 30 Minutes Intravenous Every 24 hours 12/27/20 1120 01/01/21 0959   12/27/20 2200  azithromycin (ZITHROMAX) 500 mg  in sodium chloride 0.9 % 250 mL IVPB        500 mg 250 mL/hr over 60 Minutes Intravenous Every 24 hours Jan 16, 2021 2338 01/01/21 2159   12/27/20 2200  cefTRIAXone (ROCEPHIN) 2 g in sodium chloride 0.9 % 100 mL IVPB        2 g 200 mL/hr over 30 Minutes Intravenous Every 24 hours 01/16/2021 2338 01/01/21 2159   12/27/20 1200  remdesivir 200 mg in sodium chloride 0.9% 250 mL IVPB       "Followed by" Linked Group Details   200 mg 580 mL/hr over 30 Minutes Intravenous Once 12/27/20 1120  12/27/20 1335   Jan 16, 2021 2330  cefTRIAXone (ROCEPHIN) 2 g in sodium chloride 0.9 % 100 mL IVPB  Status:  Discontinued        2 g 200 mL/hr over 30 Minutes Intravenous Every 24 hours 2021-01-16 2326 January 16, 2021 2337   01-16-2021 2330  azithromycin (ZITHROMAX) 500 mg in sodium chloride 0.9 % 250 mL IVPB  Status:  Discontinued        500 mg 250 mL/hr over 60 Minutes Intravenous Every 24 hours 16-Jan-2021 2326 01-16-21 2336   01/16/21 2300  cefTRIAXone (ROCEPHIN) 1 g in sodium chloride 0.9 % 100 mL IVPB        1 g 200 mL/hr over 30 Minutes Intravenous  Once January 16, 2021 2254 12/27/20 0036   01-16-2021 2300  azithromycin (ZITHROMAX) 500 mg in sodium chloride 0.9 % 250 mL IVPB        500 mg 250 mL/hr over 60 Minutes Intravenous  Once 2021-01-16 2254 12/27/20 0036        Objective: Vitals:   12/27/20 1300 12/27/20 1304 12/27/20 1336 12/27/20 1341  BP: (!) 144/72     Pulse: 81   84  Resp: 19   20  Temp:      TempSrc:      SpO2: (!) 89% 93% 91% 93%  Weight:      Height:        Intake/Output Summary (Last 24 hours) at 12/27/2020 1419 Last data filed at 12/27/2020 1335 Gross per 24 hour  Intake 606.56 ml  Output --  Net 606.56 ml   Filed Weights   12/27/20 1159  Weight: 69 kg    Examination:  General exam: Appears calm and comfortable  Respiratory system: Clear to auscultation. Respiratory effort normal. No respiratory distress despite requiring 15 L nonrebreather Cardiovascular system: S1 & S2 heard, RRR. No murmurs. No pedal edema. Gastrointestinal system: Abdomen is nondistended, soft and nontender.  Central nervous system: Alert  Extremities: Symmetric in appearance  Skin: No rashes, lesions or ulcers on exposed skin   Data Reviewed: I have personally reviewed following labs and imaging studies  CBC: Recent Labs  Lab 16-Jan-2021 2154 12/27/20 0448  WBC 6.4 5.7  NEUTROABS 4.4 4.9  HGB 12.5 12.4  HCT 38.1 37.9  MCV 79.7* 81.0  PLT 211 205   Basic Metabolic Panel: Recent Labs  Lab  2021-01-16 2154 12/27/20 0448  NA 127* 127*  K 3.9 4.8  CL 94* 95*  CO2 21* 23  GLUCOSE 91 119*  BUN 18 16  CREATININE 0.98 1.01*  CALCIUM 8.1* 7.8*   GFR: Estimated Creatinine Clearance: 28.8 mL/min (A) (by C-G formula based on SCr of 1.01 mg/dL (H)). Liver Function Tests: Recent Labs  Lab 01/16/2021 2154 12/27/20 0448  AST 56* 55*  ALT 22 21  ALKPHOS 58 54  BILITOT 1.8* 1.3*  PROT 7.2 7.0  ALBUMIN 2.9*  2.8*   No results for input(s): LIPASE, AMYLASE in the last 168 hours. No results for input(s): AMMONIA in the last 168 hours. Coagulation Profile: No results for input(s): INR, PROTIME in the last 168 hours. Cardiac Enzymes: No results for input(s): CKTOTAL, CKMB, CKMBINDEX, TROPONINI in the last 168 hours. BNP (last 3 results) No results for input(s): PROBNP in the last 8760 hours. HbA1C: No results for input(s): HGBA1C in the last 72 hours. CBG: No results for input(s): GLUCAP in the last 168 hours. Lipid Profile: Recent Labs    12/19/2020 2154  TRIG 162*   Thyroid Function Tests: No results for input(s): TSH, T4TOTAL, FREET4, T3FREE, THYROIDAB in the last 72 hours. Anemia Panel: Recent Labs    12/14/2020 2154  FERRITIN 944*   Sepsis Labs: Recent Labs  Lab 12/30/2020 2154 12/27/20 0039  PROCALCITON 0.38  --   LATICACIDVEN 1.3 1.0    Recent Results (from the past 240 hour(s))  SARS Coronavirus 2 by RT PCR (hospital order, performed in [facility] hospital lab) Nasopharyngeal Nasopharyngeal Swab     Status: Abnormal   Collection Time: 12/25/2020  9:54 PM   Specimen: Nasopharyngeal Swab  Result Value Ref Range Status   SARS Coronavirus 2 POSITIVE (A) NEGATIVE Final    Comment: CRITICAL RESULT CALLED TO, READ BACK BY AND VERIFIED WITH: NIELSON J. 1.26.22 @ 1133 BY MECIAL J. (NOTE) SARS-CoV-2 target nucleic acids are DETECTED  SARS-CoV-2 RNA is generally detectable in upper respiratory specimens  during the acute phase of infection.  Positive results are  indicative  of the presence of the identified virus, but do not rule out bacterial infection or co-infection with other pathogens not detected by the test.  Clinical correlation with patient history and  other diagnostic information is necessary to determine patient infection status.  The expected result is negative.  Fact Sheet for Patients:   BoilerBrush.com.cy   Fact Sheet for Healthcare Providers:   https://pope.com/    This test is not yet approved or cleared by the [place] FDA and  has been authorized for detection and/or diagnosis of SARS-CoV-2 by FDA under an Emergency Use Authorization (EUA).  This EUA will remain in effect (me aning this test can be used) for the duration of  the COVID-19 declaration under Section 564(b)(1) of the Act, 21 U.S.C. section 360-bbb-3(b)(1), unless the authorization is terminated or revoked sooner.  Performed at [facility], [address] [place], [place] [address]       Radiology Studies: CT Angio Chest PE W/Cm &/Or Wo Cm  Result Date: 12/27/2020 CLINICAL DATA:  Positive D-dimer. Shortness of breath and weakness for 1 day. Positive COVID. Pulmonary embolus is suspected with low to intermediate probability. EXAM: CT ANGIOGRAPHY CHEST WITH CONTRAST TECHNIQUE: Multidetector CT imaging of the chest was performed using the standard protocol during bolus administration of intravenous contrast. Multiplanar CT image reconstructions and MIPs were obtained to evaluate the vascular anatomy. CONTRAST:  84mL OMNIPAQUE IOHEXOL 350 MG/ML SOLN COMPARISON:  Chest radiograph 12/25/2020 FINDINGS: Cardiovascular: Motion artifact limits the examination. Good opacification of the central and segmental pulmonary arteries. No focal filling defects. No evidence of significant pulmonary embolus. Cardiac enlargement. No pericardial effusions. Coronary artery and aortic calcifications.  Mediastinum/Nodes: Esophagus is decompressed. No significant lymphadenopathy in the chest. Peripherally calcified nodule in the left thyroid gland measuring 2.3 cm diameter. Lungs/Pleura: Patchy airspace parenchymal infiltrates in the lungs compatible with COVID pneumonia. No pleural effusions. Suggestion of secretions in the right mainstem bronchus.  Upper Abdomen: Reflux of contrast material into the hepatic veins may indicate right heart failure. Surgical absence of the gallbladder. Musculoskeletal: Degenerative changes in the spine. No destructive bone lesions. Review of the MIP images confirms the above findings. IMPRESSION: 1. No evidence of significant pulmonary embolus. 2. Cardiac enlargement with reflux of contrast material into the hepatic veins may indicate right heart failure. 3. Patchy airspace parenchymal infiltrates in the lungs compatible with COVID pneumonia. 4. Peripherally calcified nodule in the left thyroid gland measuring 2.3 cm diameter. Recommend thyroid US (ref: J Am Coll Radiol. 2015 Feb;12(2): 143-50). 5. Aortic atherosclerosis and coronary artery calcifications. 6. Suggestion of secretions in the right mainstem bronchus. Aortic Atherosclerosis (ICD10-I70.0). Electronically Signed   By: Burman Nieves M.D.   On: 12/27/2020 00:40   DG Chest Port 1 View  Result Date: January 13, 2021 CLINICAL DATA:  COVID, shortness of breath EXAM: PORTABLE CHEST 1 VIEW COMPARISON:  06/19/2020 FINDINGS: Heart is borderline enlarged. Tortuous, enlarged thoracic aorta which appears aneurysmal, similar to prior study. Patchy bilateral airspace opacities. No effusions or acute bony abnormality. IMPRESSION: Patchy bilateral airspace disease concerning for pneumonia. Tortuous, dilated/aneurysmal thoracic aorta, similar to prior study. Electronically Signed   By: Charlett Nose M.D.   On: January 13, 2021 22:10      Scheduled Meds: . baricitinib  2 mg Oral Daily  . enoxaparin (LOVENOX) injection  40 mg Subcutaneous  Q24H  . methylPREDNISolone (SOLU-MEDROL) injection  40 mg Intravenous Q12H   Continuous Infusions: . azithromycin    . cefTRIAXone (ROCEPHIN)  IV    . [START ON 12/28/2020] remdesivir 100 mg in NS 100 mL       LOS: 1 day      Time spent: 40 minutes   Noralee Stain, DO [facility] 12/27/2020, 2:19 PM   Available via Epic secure chat 7am-7pm After these hours, please refer to coverage provider listed on [email or web address]

## 2020-12-27 NOTE — Plan of Care (Signed)
  Problem: Education: Goal: Knowledge of risk factors and measures for prevention of condition will improve Outcome: Not Progressing   Problem: Coping: Goal: Psychosocial and spiritual needs will be supported Outcome: Not Progressing   Problem: Respiratory: Goal: Will maintain a patent airway Outcome: Not Progressing Goal: Complications related to the disease process, condition or treatment will be avoided or minimized Outcome: Not Progressing   

## 2020-12-27 NOTE — ED Notes (Signed)
Food trey brought to pt room, assisting pt w eating, no complications

## 2020-12-27 NOTE — ED Notes (Signed)
Food trey brought to pt

## 2020-12-27 NOTE — ED Notes (Signed)
Patient transitioned from NRB @ 15L to HFNC @ 10L so that patient can eat this morning and because she keeps removing NRB.

## 2020-12-28 ENCOUNTER — Other Ambulatory Visit: Payer: Self-pay

## 2020-12-28 DIAGNOSIS — U071 COVID-19: Secondary | ICD-10-CM | POA: Diagnosis not present

## 2020-12-28 DIAGNOSIS — J1282 Pneumonia due to coronavirus disease 2019: Secondary | ICD-10-CM | POA: Diagnosis not present

## 2020-12-28 LAB — CBC WITH DIFFERENTIAL/PLATELET
Abs Immature Granulocytes: 0.06 10*3/uL (ref 0.00–0.07)
Basophils Absolute: 0 10*3/uL (ref 0.0–0.1)
Basophils Relative: 0 %
Eosinophils Absolute: 0 10*3/uL (ref 0.0–0.5)
Eosinophils Relative: 0 %
HCT: 39.2 % (ref 36.0–46.0)
Hemoglobin: 12.7 g/dL (ref 12.0–15.0)
Immature Granulocytes: 1 %
Lymphocytes Relative: 15 %
Lymphs Abs: 1.2 10*3/uL (ref 0.7–4.0)
MCH: 25.9 pg — ABNORMAL LOW (ref 26.0–34.0)
MCHC: 32.4 g/dL (ref 30.0–36.0)
MCV: 80 fL (ref 80.0–100.0)
Monocytes Absolute: 0.6 10*3/uL (ref 0.1–1.0)
Monocytes Relative: 7 %
Neutro Abs: 6 10*3/uL (ref 1.7–7.7)
Neutrophils Relative %: 77 %
Platelets: 287 10*3/uL (ref 150–400)
RBC: 4.9 MIL/uL (ref 3.87–5.11)
RDW: 14 % (ref 11.5–15.5)
WBC: 7.7 10*3/uL (ref 4.0–10.5)
nRBC: 0 % (ref 0.0–0.2)

## 2020-12-28 LAB — COMPREHENSIVE METABOLIC PANEL
ALT: 22 U/L (ref 0–44)
AST: 43 U/L — ABNORMAL HIGH (ref 15–41)
Albumin: 2.8 g/dL — ABNORMAL LOW (ref 3.5–5.0)
Alkaline Phosphatase: 51 U/L (ref 38–126)
Anion gap: 11 (ref 5–15)
BUN: 22 mg/dL (ref 8–23)
CO2: 24 mmol/L (ref 22–32)
Calcium: 8.1 mg/dL — ABNORMAL LOW (ref 8.9–10.3)
Chloride: 99 mmol/L (ref 98–111)
Creatinine, Ser: 0.87 mg/dL (ref 0.44–1.00)
GFR, Estimated: >60 mL/min (ref >60)
Glucose, Bld: 146 mg/dL — ABNORMAL HIGH (ref 70–99)
Potassium: 3.7 mmol/L (ref 3.5–5.1)
Sodium: 134 mmol/L — ABNORMAL LOW (ref 135–145)
Total Bilirubin: 0.7 mg/dL (ref 0.3–1.2)
Total Protein: 7.3 g/dL (ref 6.5–8.1)

## 2020-12-28 LAB — LACTATE DEHYDROGENASE: LDH: 237 U/L — ABNORMAL HIGH (ref 98–192)

## 2020-12-28 LAB — FERRITIN: Ferritin: 905 ng/mL — ABNORMAL HIGH (ref 11–307)

## 2020-12-28 LAB — PROCALCITONIN: Procalcitonin: 0.2 ng/mL

## 2020-12-28 LAB — D-DIMER, QUANTITATIVE: D-Dimer, Quant: 1.19 ug/mL-FEU — ABNORMAL HIGH (ref 0.00–0.50)

## 2020-12-28 LAB — C-REACTIVE PROTEIN: CRP: 7.7 mg/dL — ABNORMAL HIGH (ref <1.0)

## 2020-12-28 MED ORDER — ACETAMINOPHEN 325 MG PO TABS
650.0000 mg | ORAL_TABLET | Freq: Four times a day (QID) | ORAL | Status: DC | PRN
  Administered 2020-12-28 – 2020-12-31 (×6): 650 mg via ORAL
  Filled 2020-12-28 (×6): qty 2

## 2020-12-28 MED ORDER — ALBUTEROL SULFATE HFA 108 (90 BASE) MCG/ACT IN AERS: 1.0000 | INHALATION_SPRAY | RESPIRATORY_TRACT | Status: DC | PRN

## 2020-12-28 NOTE — Progress Notes (Signed)
SLP Cancellation Note  Patient Details Name: Ann Coleman MRN: [id number] DOB: [birth year]   Cancelled treatment:       Reason Eval/Treat Not Completed: Other (comment);Medical issues which prohibited therapy (Per RN, pt having respiratory issues requiring higher levels of oxygen per RN- needing NRB, not appropriate to be seen at this time, will continue efforts)  Rolena Infante, MS [facility] SLP Acute Rehab Services Office [phone] Pager [phone]   [place], [place] 12/28/2020, 2:37 PM

## 2020-12-28 NOTE — Progress Notes (Signed)
Patient's sats were in the 80s. Patient found to have removed the NRB and was removing the HFNC. Replaced the Pulse Ox on the finger. Sats 99% now after replacing Oxygen devices.

## 2020-12-28 NOTE — Progress Notes (Addendum)
Nurse called and said the pt sats were in the 70'2 and she needed a hhfnc. Rt checked on the Pt and the nurse said the pt was 97%. The nurse said that theNRB was to big for the pt.RT left Pt with nrb and a 15 l salterwith a sat 97%

## 2020-12-28 NOTE — Care Management Important Message (Signed)
Important Message  Patient Details Verbal consent given from [name]. Name: Ann Coleman MRN: [id number] Date of Birth: 1924-02-04   Medicare Important Message Given:  Yes     Caren Macadam 12/28/2020, 1:41 PM

## 2020-12-28 NOTE — Progress Notes (Signed)
Patient's  SATs 77%.The patient had taken off the NRB and HFNC again. RN replaced HFNC and NRB and SAts returned to 91%-96%.

## 2020-12-28 NOTE — Progress Notes (Signed)
PROGRESS NOTE    Ann Coleman  VWU:[id number] DOB: [birth year] DOA: January 18, 2021 PCP: Patient, No Pcp Per     Brief Narrative:  Ann Coleman is a Cambodian-speaking 87 y.o. female with medical history significant of multiple episode of CVA, essential hypertension, diastolic dysfunction, hyperlipidemia, chronic bilateral lower extremity pain who is known to have Covid exposure at home with family members positive with Covid. Patient was having significant weakness or shortness of breath. When EMS arrived she was found to be hypoxic. Patient brought to the ER where she is evaluated. She appears to have bilateral pneumonia.  She initially required 2 L of oxygen in the emergency department.  Covid test resulted positive.  New events last 24 hours / Subjective: Patient evaluated with iPad interpreter.  She admits not feeling so well, complains of sharp throbbing pains on the right side of her chest which is reproducible.  She is on 15 L of high flow nasal cannula O2.  I encouraged her to try prone positioning as Kaydi as possible, she was agreeable to this.  After my initial morning evaluation, I was notified by RN that patient desaturating on 15 L high flow nasal cannula O2.  She was placed on high flow as well as nonrebreather.  I checked in on patient again, she was resting with her eyes closed, appeared to be comfortable, satting 87%.  Patient may need to be upgraded to heated high flow oxygen if continues to desaturate.  Assessment & Plan:   Principal Problem:   Pneumonia due to COVID-19 virus Active Problems:   HLD (hyperlipidemia)   Essential hypertension   CKD (chronic kidney disease), stage II   Diastolic dysfunction   Acute hypoxemic respiratory failure secondary to COVID-19  -In the emergency department, SPO2 70% on room air and required nonrebreather at 15 L, and now on NRB.  She may need to upgrade to heated high flow oxygen -CTA negative for PE  COVID-19 Pneumonia -Tested positive on  1/26 -Continue Remdesivir 1/27 >> -Continue steroids 1/26 >>  -Continue baricitinib 1/27 >> -Also continue Rocephin and azithromycin due to elevated procalcitonin, initial concern for bacterial  -Supportive treatments as ordered, encourage IS, encourage prone positioning, encourage mobilization   COVID-19 Labs  Recent Labs    2021/01/18 2154 12/27/20 1029 12/28/20 1118  DDIMER 1.60* 1.52* 1.19*  FERRITIN 944*  --   --   LDH 249*  --  237*  CRP 13.6* 13.2*  --     Lab Results  Component Value Date   SARSCOV2NAA POSITIVE (A) 18-Jan-2021   SARSCOV2NAA NEGATIVE 05/29/2020    CKD stage IIIa -Continue to monitor BMP  History of CVA  -Residual dysphagia  Left thyroid nodule -Recommend thyroid ultrasound for work-up as an outpatient    DVT prophylaxis:  enoxaparin (LOVENOX) injection 40 mg Start: 12/27/20 1000  Code Status: DNR, confirmed with 2 daughters over the phone they desire natural death in case of significant decompensation Family Communication: No family at bedside, discussed with daughter over the phone and updated on patient's decompensation today Disposition Plan:  Status is: Inpatient  Remains inpatient appropriate because:Hemodynamically unstable   Dispo: The patient is from: Home              Anticipated d/c is to: To be determined              Anticipated d/c date is: > 3 days              Patient currently is not medically  stable to d/c.   Difficult to place patient No   Antimicrobials:  Anti-infectives (From admission, onward)   Start     Dose/Rate Route Frequency Ordered Stop   12/28/20 1000  remdesivir 100 mg in sodium chloride 0.9 % 100 mL IVPB       "Followed by" Linked Group Details   100 mg 200 mL/hr over 30 Minutes Intravenous Every 24 hours 12/27/20 1120 01/01/21 0959   12/27/20 2200  azithromycin (ZITHROMAX) 500 mg in sodium chloride 0.9 % 250 mL IVPB        500 mg 250 mL/hr over 60 Minutes Intravenous Every 24 hours 12-31-20 2338  01/01/21 2159   12/27/20 2200  cefTRIAXone (ROCEPHIN) 2 g in sodium chloride 0.9 % 100 mL IVPB        2 g 200 mL/hr over 30 Minutes Intravenous Every 24 hours 12/31/20 2338 01/01/21 2159   12/27/20 1200  remdesivir 200 mg in sodium chloride 0.9% 250 mL IVPB       "Followed by" Linked Group Details   200 mg 580 mL/hr over 30 Minutes Intravenous Once 12/27/20 1120 12/27/20 1335   12/31/2020 2330  cefTRIAXone (ROCEPHIN) 2 g in sodium chloride 0.9 % 100 mL IVPB  Status:  Discontinued        2 g 200 mL/hr over 30 Minutes Intravenous Every 24 hours 12/31/20 2326 12-31-20 2337   12/31/20 2330  azithromycin (ZITHROMAX) 500 mg in sodium chloride 0.9 % 250 mL IVPB  Status:  Discontinued        500 mg 250 mL/hr over 60 Minutes Intravenous Every 24 hours 12-31-20 2326 12/31/20 2336   2020-12-31 2300  cefTRIAXone (ROCEPHIN) 1 g in sodium chloride 0.9 % 100 mL IVPB        1 g 200 mL/hr over 30 Minutes Intravenous  Once 31-Dec-2020 2254 12/27/20 0036   2020/12/31 2300  azithromycin (ZITHROMAX) 500 mg in sodium chloride 0.9 % 250 mL IVPB        500 mg 250 mL/hr over 60 Minutes Intravenous  Once 12/31/2020 2254 12/27/20 0036       Objective: Vitals:   12/27/20 2109 12/28/20 0204 12/28/20 0400 12/28/20 1225  BP: 135/86 132/69 (!) 122/51   Pulse: 90 72 70   Resp: 20 20 20    Temp: 97.7 F (36.5 C) (!) 97.4 F (36.3 C) (!) 97.4 F (36.3 C)   TempSrc: Axillary Axillary Axillary   SpO2: 95% 93% 93% 90%  Weight:      Height:        Intake/Output Summary (Last 24 hours) at 12/28/2020 1259 Last data filed at 12/28/2020 0300 Gross per 24 hour  Intake 999.49 ml  Output 250 ml  Net 749.49 ml   Filed Weights   12/27/20 1159  Weight: 69 kg    Examination: General exam: Appears calm and comfortable  Respiratory system: Clear to auscultation anteriorly, increased respiratory rate Cardiovascular system: S1 & S2 heard, RRR. No pedal edema..  Chest pain on right chest reproducible to  palpation Gastrointestinal system: Abdomen is nondistended, soft and nontender.  Central nervous system: Alert and oriented. Non focal exam. Speech clear  Extremities: Symmetric in appearance bilaterally  Skin: No rashes, lesions or ulcers on exposed skin  Psychiatry: Judgement and insight appear stable. Mood & affect appropriate.    Data Reviewed: I have personally reviewed following labs and imaging studies  CBC: Recent Labs  Lab 12-31-20 2154 12/27/20 0448 12/28/20 1118  WBC 6.4 5.7 7.7  NEUTROABS 4.4  4.9 6.0  HGB 12.5 12.4 12.7  HCT 38.1 37.9 39.2  MCV 79.7* 81.0 80.0  PLT 211 205 287   Basic Metabolic Panel: Recent Labs  Lab 2021/01/18 2154 12/27/20 0448 12/28/20 1118  NA 127* 127* 134*  K 3.9 4.8 3.7  CL 94* 95* 99  CO2 21* 23 24  GLUCOSE 91 119* 146*  BUN 18 16 22   CREATININE 0.98 1.01* 0.87  CALCIUM 8.1* 7.8* 8.1*   GFR: Estimated Creatinine Clearance: 33.4 mL/min (by C-G formula based on SCr of 0.87 mg/dL). Liver Function Tests: Recent Labs  Lab 01/18/21 2154 12/27/20 0448 12/28/20 1118  AST 56* 55* 43*  ALT 22 21 22   ALKPHOS 58 54 51  BILITOT 1.8* 1.3* 0.7  PROT 7.2 7.0 7.3  ALBUMIN 2.9* 2.8* 2.8*   No results for input(s): LIPASE, AMYLASE in the last 168 hours. No results for input(s): AMMONIA in the last 168 hours. Coagulation Profile: No results for input(s): INR, PROTIME in the last 168 hours. Cardiac Enzymes: No results for input(s): CKTOTAL, CKMB, CKMBINDEX, TROPONINI in the last 168 hours. BNP (last 3 results) No results for input(s): PROBNP in the last 8760 hours. HbA1C: No results for input(s): HGBA1C in the last 72 hours. CBG: No results for input(s): GLUCAP in the last 168 hours. Lipid Profile: Recent Labs    01-18-2021 2154  TRIG 162*   Thyroid Function Tests: No results for input(s): TSH, T4TOTAL, FREET4, T3FREE, THYROIDAB in the last 72 hours. Anemia Panel: Recent Labs    01-18-21 2154  FERRITIN 944*   Sepsis  Labs: Recent Labs  Lab 01/18/2021 2154 12/27/20 0039 12/28/20 1118  PROCALCITON 0.38  --  0.20  LATICACIDVEN 1.3 1.0  --     Recent Results (from the past 240 hour(s))  SARS Coronavirus 2 by RT PCR (hospital order, performed in [facility] hospital lab) Nasopharyngeal Nasopharyngeal Swab     Status: Abnormal   Collection Time: 2021/01/18  9:54 PM   Specimen: Nasopharyngeal Swab  Result Value Ref Range Status   SARS Coronavirus 2 POSITIVE (A) NEGATIVE Final    Comment: CRITICAL RESULT CALLED TO, READ BACK BY AND VERIFIED WITH: NIELSON J. 01/18/21 @ 1133 BY MECIAL J. (NOTE) SARS-CoV-2 target nucleic acids are DETECTED  SARS-CoV-2 RNA is generally detectable in upper respiratory specimens  during the acute phase of infection.  Positive results are indicative  of the presence of the identified virus, but do not rule out bacterial infection or co-infection with other pathogens not detected by the test.  Clinical correlation with patient history and  other diagnostic information is necessary to determine patient infection status.  The expected result is negative.  Fact Sheet for Patients:   12/28/20   Fact Sheet for Healthcare Providers:   01-19-1972    This test is not yet approved or cleared by the [employer] FDA and  has been authorized for detection and/or diagnosis of SARS-CoV-2 by FDA under an Emergency Use Authorization (EUA).  This EUA will remain in effect (me aning this test can be used) for the duration of  the COVID-19 declaration under Section 564(b)(1) of the Act, 21 U.S.C. section 360-bbb-3(b)(1), unless the authorization is terminated or revoked sooner.  Performed at [facility], [address] [place], [place]   Blood Culture (routine x 2)     Status: None (Preliminary result)   Collection Time: Jan 18, 2021  9:54 PM   Specimen: BLOOD  Result Value Ref Range Status  Specimen Description   Final    BLOOD LEFT ANTECUBITAL Performed at [facility], [address], [place], [place] [address]    Special Requests   Final    BOTTLES DRAWN AEROBIC AND ANAEROBIC Blood Culture adequate volume Performed at [facility], [address], [place], [place] [address]    Culture   Final    NO GROWTH 1 DAY Performed at [facility], [address] [place], [place] [address]    Report Status PENDING  Incomplete  Blood Culture (routine x 2)     Status: None (Preliminary result)   Collection Time: 01/09/2021  9:54 PM   Specimen: BLOOD  Result Value Ref Range Status   Specimen Description   Final    BLOOD LEFT HAND Performed at [facility], [address] [place], [place] [address]    Special Requests   Final    BOTTLES DRAWN AEROBIC AND ANAEROBIC Blood Culture adequate volume Performed at [facility], [address] [place], [place] [address]    Culture   Final    NO GROWTH 1 DAY Performed at [facility], [address] [place], [place] [address]    Report Status PENDING  Incomplete      Radiology Studies: CT Angio Chest PE W/Cm &/Or Wo Cm  Result Date: 12/27/2020 CLINICAL DATA:  Positive D-dimer. Shortness of breath and weakness for 1 day. Positive COVID. Pulmonary embolus is suspected with low to intermediate probability. EXAM: CT ANGIOGRAPHY CHEST WITH CONTRAST TECHNIQUE: Multidetector CT imaging of the chest was performed using the standard protocol during bolus administration of intravenous contrast. Multiplanar CT image reconstructions and MIPs were obtained to evaluate the vascular anatomy. CONTRAST:  35mL OMNIPAQUE IOHEXOL 350 MG/ML SOLN COMPARISON:  Chest radiograph 01/09/2021 FINDINGS: Cardiovascular: Motion artifact limits the examination. Good opacification of the central and segmental pulmonary arteries. No focal filling defects. No evidence of significant  pulmonary embolus. Cardiac enlargement. No pericardial effusions. Coronary artery and aortic calcifications. Mediastinum/Nodes: Esophagus is decompressed. No significant lymphadenopathy in the chest. Peripherally calcified nodule in the left thyroid gland measuring 2.3 cm diameter. Lungs/Pleura: Patchy airspace parenchymal infiltrates in the lungs compatible with COVID pneumonia. No pleural effusions. Suggestion of secretions in the right mainstem bronchus. Upper Abdomen: Reflux of contrast material into the hepatic veins may indicate right heart failure. Surgical absence of the gallbladder. Musculoskeletal: Degenerative changes in the spine. No destructive bone lesions. Review of the MIP images confirms the above findings. IMPRESSION: 1. No evidence of significant pulmonary embolus. 2. Cardiac enlargement with reflux of contrast material into the hepatic veins may indicate right heart failure. 3. Patchy airspace parenchymal infiltrates in the lungs compatible with COVID pneumonia. 4. Peripherally calcified nodule in the left thyroid gland measuring 2.3 cm diameter. Recommend thyroid US (ref: J Am Coll Radiol. 2015 Feb;12(2): 143-50). 5. Aortic atherosclerosis and coronary artery calcifications. 6. Suggestion of secretions in the right mainstem bronchus. Aortic Atherosclerosis (ICD10-I70.0). Electronically Signed   By: Burman Nieves M.D.   On: 12/27/2020 00:40   DG Chest Port 1 View  Result Date: January 09, 2021 CLINICAL DATA:  COVID, shortness of breath EXAM: PORTABLE CHEST 1 VIEW COMPARISON:  06/19/2020 FINDINGS: Heart is borderline enlarged. Tortuous, enlarged thoracic aorta which appears aneurysmal, similar to prior study. Patchy bilateral airspace opacities. No effusions or acute bony abnormality. IMPRESSION: Patchy bilateral airspace disease concerning for pneumonia. Tortuous, dilated/aneurysmal thoracic aorta, similar to prior study. Electronically Signed   By:  Charlett NoseKevin  Dover M.D.   On: 12/01/2020 22:10       Scheduled Meds: . baricitinib  2 mg Oral Daily  . enoxaparin (LOVENOX) injection  40 mg Subcutaneous Q24H  . methylPREDNISolone (SOLU-MEDROL) injection  40 mg Intravenous Q12H   Continuous Infusions: . azithromycin Stopped (12/27/20 2339)  . cefTRIAXone (ROCEPHIN)  IV Stopped (12/27/20 2200)  . remdesivir 100 mg in NS 100 mL Stopped (12/28/20 1022)     LOS: 2 days      Time spent: 40 minutes   Noralee StainJennifer Landers Prajapati, DO [facility] 12/28/2020, 12:59 PM   Available via Epic secure chat 7am-7pm After these hours, please refer to coverage provider listed on [email or web address]

## 2020-12-28 NOTE — Plan of Care (Signed)
  Problem: Coping: Goal: Psychosocial and spiritual needs will be supported Outcome: Progressing   

## 2020-12-28 NOTE — Progress Notes (Signed)
Occupational Therapy Evaluation  Patient lives at home with family, has assist for ADLs and transfers to w/c does not ambulate. Currently patient require max A x1-2 for bed mobility, min x2 for stand pivot to recliner chair and total A for LB dressing. Difficulty maintaining accurate pleth wave especially with activity, patient between 85-88% on 15L HFNC during session. Recommend continued acute OT services to maximize patient activity tolerance, strength in order to reduce caregiver burden with self care.    12/28/20 1222  OT Visit Information  Last OT Received On 12/28/20  Assistance Needed +1  PT/OT/SLP Co-Evaluation/Treatment Yes  Reason for Co-Treatment For patient/therapist safety;To address functional/ADL transfers  OT goals addressed during session ADL's and self-care  History of Present Illness Pt is a Cambodian-speaking 87 y.o. female with medical history significant of multiple episode of CVA, essential hypertension, diastolic dysfunction, hyperlipidemia, chronic bilateral lower extremity pain who is known to have Covid exposure at home with family members positive with Covid. Patient was having significant weakness or shortness of breath. Pt admitted with acute resp failure due to COVID PNE  Precautions  Precautions Fall  Precaution Comments uses w/c at baseline, monitor O2 currently on 15L HFNC  Restrictions  Weight Bearing Restrictions No  Home Living  Family/patient expects to be discharged to: Private residence  Living Arrangements Children  Available Help at Discharge Family;Available 24 hours/day  Type of Home House  Home Access Stairs to enter  Entrance Stairs-Number of Steps 1  Entrance Stairs-Rails None  Home Layout One level  Bathroom Secondary school teacher - manual;Hospital bed;BSC;Shower seat  Prior Function  Level of Independence Needs assistance  Gait / Transfers Assistance Needed pt reports family  transfers her to w/c and will push her, does not walk  ADL's / Homemaking Assistance Needed family assists with ADLs  Comments Info comfirmed with DTR via phone per PT  Communication  Communication Prefers language other than English;Interpreter utilized  Pain Assessment  Pain Assessment Faces  Faces Pain Scale 0  Cognition  Arousal/Alertness Awake/alert  Behavior During Therapy Flat affect  Overall Cognitive Status Difficult to assess  General Comments patient does state her name and knows shes in the hospital  Difficult to assess due to Non-English speaking  Upper Extremity Assessment  Upper Extremity Assessment Generalized weakness  Lower Extremity Assessment  Lower Extremity Assessment Defer to PT evaluation  ADL  Overall ADL's  Needs assistance/impaired  Grooming Set up;Sitting  Grooming Details (indicate cue type and reason) patient demonstrates hand to mouth with picking up water  Upper Body Bathing Minimal assistance;Sitting  Lower Body Bathing Maximal assistance;Sitting/lateral leans  Upper Body Dressing  Minimal assistance;Sitting  Lower Body Dressing Total assistance;Bed level  Lower Body Dressing Details (indicate cue type and reason) don socks  Toilet Transfer Minimal assistance;+2 for physical assistance;+2 for safety/equipment;Stand-pivot;BSC  Toilet Transfer Details (indicate cue type and reason) pt able to support weight through LEs to assist with pivot to recliner  Toileting- Clothing Manipulation and Hygiene Total assistance  Functional mobility during ADLs Minimal assistance;+2 for physical assistance;+2 for safety/equipment  General ADL Comments unsure how heavy of assist patient is at baseline with transfers, appears that she is likely close to her baseline  Bed Mobility  Overal bed mobility Needs Assistance  Bed Mobility Supine to Sit  Supine to sit Max assist;HOB elevated;+2 for physical assistance  General bed mobility comments decreased initiation pt  telling intrepreter "I can't do it" use of bed  pad and max A x1-2  Transfers  Overall transfer level Needs assistance  Equipment used 2 person hand held assist  Transfers Stand Pivot Transfers  Stand pivot transfers Min assist;+2 physical assistance  General transfer comment patient able to support weight through LEs to assist with stand pivot to recliner  Balance  Overall balance assessment Needs assistance  Sitting-balance support Feet supported  Sitting balance-Leahy Scale Fair  Standing balance support Bilateral upper extremity supported  Standing balance-Leahy Scale Poor  Standing balance comment reliant on support from therapists  OT - End of Session  Equipment Utilized During Treatment Oxygen  Activity Tolerance Patient tolerated treatment well  Patient left in chair;with call bell/phone within reach;with chair alarm set  Nurse Communication Mobility status;Other (comment) (sats)  OT Assessment  OT Recommendation/Assessment Patient needs continued OT Services  OT Visit Diagnosis Muscle weakness (generalized) (M62.81)  OT Problem List Decreased activity tolerance;Decreased strength;Cardiopulmonary status limiting activity  OT Plan  OT Frequency (ACUTE ONLY) Min 2X/week  OT Treatment/Interventions (ACUTE ONLY) Therapeutic exercise;Patient/family education;Balance training;Therapeutic activities;Self-care/ADL training  AM-PAC OT "6 Clicks" Daily Activity Outcome Measure (Version 2)  Help from another person eating meals? 3  Help from another person taking care of personal grooming? 3  Help from another person toileting, which includes using toliet, bedpan, or urinal? 1  Help from another person bathing (including washing, rinsing, drying)? 2  Help from another person to put on and taking off regular upper body clothing? 3  Help from another person to put on and taking off regular lower body clothing? 1  6 Click Score 13  OT Recommendation  Follow Up Recommendations  Supervision/Assistance - 24 hour;Other (comment) (home if fam can provide current level of A)  OT Equipment None recommended by OT  Individuals Consulted  Consulted and Agree with Results and Recommendations Patient  Acute Rehab OT Goals  Patient Stated Goal eat rice  OT Goal Formulation With patient  Time For Goal Achievement 01/11/21  Potential to Achieve Goals Fair  OT Time Calculation  OT Start Time (ACUTE ONLY) 1015  OT Stop Time (ACUTE ONLY) 1054  OT Time Calculation (min) 39 min  OT General Charges  $OT Visit 1 Visit  OT Evaluation  $OT Eval Moderate Complexity 1 Mod  Written Expression  Dominant Hand Right   Marlyce Huge OT OT pager: [phone]

## 2020-12-28 NOTE — TOC Progression Note (Signed)
Transition of Care [facility]) - Progression Note    Patient Details  Name: Ann Coleman MRN: [id number] Date of Birth: 11-Jan-1924  [facility]) CM/SW Contact  Geni Bers, RN Phone Number: 12/28/2020, 12:07 PM  Clinical Narrative:    Pt is from home with Children and family. TOC will continue to follow for discharge needs.    Expected Discharge Plan: Home/Self Care    Expected Discharge Plan and Services Expected Discharge Plan: Home/Self Care                                               Social Determinants of Health (SDOH) Interventions    Readmission Risk Interventions No flowsheet data found.

## 2020-12-28 NOTE — Evaluation (Addendum)
Physical Therapy Evaluation Patient Details Name: Ann Coleman MRN: [id number] DOB: 1924/04/17 Today's Date: 12/28/2020   History of Present Illness  Pt is a Cambodian-speaking 87 y.o. female with medical history significant of multiple episode of CVA, essential hypertension, diastolic dysfunction, hyperlipidemia, chronic bilateral lower extremity pain who is known to have Covid exposure at home with family members positive with Covid. Patient was having significant weakness or shortness of breath. Pt admitted with acute resp failure due to COVID PNE  Clinical Impression  Pt admitted with above diagnosis. Pt is [name] speaking and was able to communicate via interpretor with minor difficulties.  She has 24 hr assist and DME at home.  Pt is non-ambulatory but able to stand pivot with assist at home.  Today pt presented with decreased mobility, strength, endurance, balance , and safety.  She required mod A of 2 for transfers with sit/supine due to decreased initiation and min A of 2 for stand pivot for safety.  Pt is near baseline mobility but requiring 15 L of O2 with sats 85-90% and will benefit from PT to maintain mobility and progress to baseline so that pt can return home with family. Pt currently with functional limitations due to the deficits listed below (see PT Problem List). Pt will benefit from skilled PT to increase their independence and safety with mobility to allow discharge to the venue listed below.       Follow Up Recommendations Supervision/Assistance - 24 hour;Home health PT    Equipment Recommendations  None recommended by PT (has DME)    Recommendations for Other Services       Precautions / Restrictions Precautions Precautions: Fall Precaution Comments: uses w/c at baseline, monitor O2 currently on 15L HFNC Restrictions Weight Bearing Restrictions: No      Mobility  Bed Mobility Overal bed mobility: Needs Assistance Bed Mobility: Supine to Sit     Supine to sit:  Mod assist;+2 for physical assistance;+2 for safety/equipment     General bed mobility comments: decreased initiation pt telling intrepreter "I can't do it" ; pt minimally assisting with legs and use of bed pad to transfer to sitting    Transfers Overall transfer level: Needs assistance Equipment used: 2 person hand held assist Transfers: Stand Pivot Transfers;Sit to/from Stand Sit to Stand: Mod assist;+2 safety/equipment Stand pivot transfers: Min assist;+2 physical assistance       General transfer comment: Mod A of 2 for safety with sit to stand; patient able to support weight through LEs to assist with stand pivot to recliner  Ambulation/Gait             General Gait Details: non ambulatory baseline  Stairs            Wheelchair Mobility    Modified Rankin (Stroke Patients Only)       Balance Overall balance assessment: Needs assistance Sitting-balance support: Feet supported;No upper extremity supported Sitting balance-Leahy Scale: Fair Sitting balance - Comments: Able to sit EOB without support   Standing balance support: Bilateral upper extremity supported Standing balance-Leahy Scale: Poor Standing balance comment: reliant on support from therapists                             Pertinent Vitals/Pain Pain Assessment: No/denies pain Faces Pain Scale: No hurt    Home Living Family/patient expects to be discharged to:: Private residence Living Arrangements: Children Available Help at Discharge: Family;Available 24 hours/day Type of Home: House Home Access:  Stairs to enter Entrance Stairs-Rails: None Entrance Stairs-Number of Steps: 1 Home Layout: One level Home Equipment: Wheelchair - manual;Hospital bed;Bedside commode;Shower seat Additional Comments: spoke with dtr on phone after session    Prior Function Level of Independence: Needs assistance   Gait / Transfers Assistance Needed: pt reports family transfers her to w/c and will  push her, does not walk - confirmed with daughter  ADL's / Homemaking Assistance Needed: family assists with ADLs  Comments: Info comfirmed with DTR via phone per PT     Hand Dominance   Dominant Hand: Right    Extremity/Trunk Assessment   Upper Extremity Assessment Upper Extremity Assessment: Generalized weakness    Lower Extremity Assessment Lower Extremity Assessment: Generalized weakness (unable to follow MMT commands)    Cervical / Trunk Assessment Cervical / Trunk Assessment: Kyphotic  Communication   Communication: Prefers language other than [place];Interpreter utilized  Cognition Arousal/Alertness: Awake/alert Behavior During Therapy: Flat affect Overall Cognitive Status: Difficult to assess                                 General Comments: patient does state her name and knows shes in the hospital      General Comments General comments (skin integrity, edema, etc.): Pt on 15L HFNC, poor wave form especially with mobility therefore difficult to obtain true reading, patient does not appear in respiratory distress. sats maintaining between 85-90% during session    Exercises     Assessment/Plan    PT Assessment Patient needs continued PT services  PT Problem List Decreased strength;Decreased mobility;Decreased activity tolerance;Cardiopulmonary status limiting activity;Decreased balance;Decreased knowledge of use of DME       PT Treatment Interventions DME instruction;Therapeutic activities;Therapeutic exercise;Patient/family education;Balance training;Functional mobility training    PT Goals (Current goals can be found in the Care Plan section)  Acute Rehab PT Goals Patient Stated Goal: eat rice and dry fish (notified dtr of pt's request as we don't have dry fish here) PT Goal Formulation: With patient/family Time For Goal Achievement: 01/11/21 Potential to Achieve Goals: Fair    Frequency Min 3X/week   Barriers to discharge         Co-evaluation PT/OT/SLP Co-Evaluation/Treatment: Yes Reason for Co-Treatment: Complexity of the patient's impairments (multi-system involvement);For patient/therapist safety;Other (comment) (87 yo on 15 L HFNC) PT goals addressed during session: Mobility/safety with mobility OT goals addressed during session: ADL's and self-care       AM-PAC PT "6 Clicks" Mobility  Outcome Measure Help needed turning from your back to your side while in a flat bed without using bedrails?: A Little Help needed moving from lying on your back to sitting on the side of a flat bed without using bedrails?: A Lot Help needed moving to and from a bed to a chair (including a wheelchair)?: A Little Help needed standing up from a chair using your arms (e.g., wheelchair or bedside chair)?: A Little Help needed to walk in hospital room?: Total Help needed climbing 3-5 steps with a railing? : Total 6 Click Score: 13    End of Session Equipment Utilized During Treatment: Gait belt;Oxygen Activity Tolerance: Patient tolerated treatment well Patient left: with chair alarm set;in chair;with call bell/phone within reach;with nursing/sitter in room Nurse Communication: Mobility status PT Visit Diagnosis: Muscle weakness (generalized) (M62.81);Unsteadiness on feet (R26.81)    Time: 3474-2595 PT Time Calculation (min) (ACUTE ONLY): 39 min   Charges:   PT Evaluation $PT Eval  Moderate Complexity: 1 Mod PT Treatments $Therapeutic Activity: 8-22 mins        Anise Salvo, PT Acute Rehab Services Pager [phone] [facility] [phone]    Rayetta Humphrey 12/28/2020, 1:57 PM

## 2020-12-29 ENCOUNTER — Inpatient Hospital Stay (HOSPITAL_COMMUNITY): Payer: Medicare Other

## 2020-12-29 DIAGNOSIS — J1282 Pneumonia due to coronavirus disease 2019: Secondary | ICD-10-CM | POA: Diagnosis not present

## 2020-12-29 DIAGNOSIS — U071 COVID-19: Secondary | ICD-10-CM

## 2020-12-29 LAB — FERRITIN: Ferritin: 781 ng/mL — ABNORMAL HIGH (ref 11–307)

## 2020-12-29 LAB — CBC WITH DIFFERENTIAL/PLATELET
Abs Immature Granulocytes: 0.1 10*3/uL — ABNORMAL HIGH (ref 0.00–0.07)
Basophils Absolute: 0 10*3/uL (ref 0.0–0.1)
Basophils Relative: 0 %
Eosinophils Absolute: 0 10*3/uL (ref 0.0–0.5)
Eosinophils Relative: 0 %
HCT: 39.4 % (ref 36.0–46.0)
Hemoglobin: 13 g/dL (ref 12.0–15.0)
Immature Granulocytes: 1 %
Lymphocytes Relative: 8 %
Lymphs Abs: 0.8 10*3/uL (ref 0.7–4.0)
MCH: 26.3 pg (ref 26.0–34.0)
MCHC: 33 g/dL (ref 30.0–36.0)
MCV: 79.6 fL — ABNORMAL LOW (ref 80.0–100.0)
Monocytes Absolute: 0.7 10*3/uL (ref 0.1–1.0)
Monocytes Relative: 7 %
Neutro Abs: 8.9 10*3/uL — ABNORMAL HIGH (ref 1.7–7.7)
Neutrophils Relative %: 84 %
Platelets: 321 10*3/uL (ref 150–400)
RBC: 4.95 MIL/uL (ref 3.87–5.11)
RDW: 14 % (ref 11.5–15.5)
WBC: 10.5 10*3/uL (ref 4.0–10.5)
nRBC: 0 % (ref 0.0–0.2)

## 2020-12-29 LAB — COMPREHENSIVE METABOLIC PANEL
ALT: 21 U/L (ref 0–44)
AST: 39 U/L (ref 15–41)
Albumin: 2.7 g/dL — ABNORMAL LOW (ref 3.5–5.0)
Alkaline Phosphatase: 45 U/L (ref 38–126)
Anion gap: 12 (ref 5–15)
BUN: 26 mg/dL — ABNORMAL HIGH (ref 8–23)
CO2: 24 mmol/L (ref 22–32)
Calcium: 8 mg/dL — ABNORMAL LOW (ref 8.9–10.3)
Chloride: 99 mmol/L (ref 98–111)
Creatinine, Ser: 0.86 mg/dL (ref 0.44–1.00)
GFR, Estimated: >60 mL/min (ref >60)
Glucose, Bld: 139 mg/dL — ABNORMAL HIGH (ref 70–99)
Potassium: 3.6 mmol/L (ref 3.5–5.1)
Sodium: 135 mmol/L (ref 135–145)
Total Bilirubin: 0.7 mg/dL (ref 0.3–1.2)
Total Protein: 6.7 g/dL (ref 6.5–8.1)

## 2020-12-29 LAB — C-REACTIVE PROTEIN: CRP: 4.4 mg/dL — ABNORMAL HIGH (ref <1.0)

## 2020-12-29 LAB — PROCALCITONIN: Procalcitonin: 0.11 ng/mL

## 2020-12-29 LAB — D-DIMER, QUANTITATIVE: D-Dimer, Quant: 1.28 ug/mL-FEU — ABNORMAL HIGH (ref 0.00–0.50)

## 2020-12-29 MED ORDER — BARICITINIB 2 MG PO TABS
4.0000 mg | ORAL_TABLET | Freq: Every day | ORAL | Status: DC
  Administered 2020-12-30 – 2021-01-03 (×4): 4 mg via ORAL
  Filled 2020-12-29 (×5): qty 2

## 2020-12-29 NOTE — Progress Notes (Signed)
In and Out cath was performed. The output was 450 ml of urine. Amber colored urine.

## 2020-12-29 NOTE — Progress Notes (Signed)
Bladder scanned patient, got 467 ml. Patient indicated that her abdomen was hurting her. PCP was notified. Will continue to monitor the patient.

## 2020-12-29 NOTE — Progress Notes (Signed)
PROGRESS NOTE    Ann Coleman  ZOX:[id number] DOB: 03-May-1924 DOA: 2021/01/16 PCP: Patient, No Pcp Per     Brief Narrative:  Ann Coleman is a Cambodian-speaking 87 y.o. female with medical history significant of multiple episode of CVA, essential hypertension, diastolic dysfunction, hyperlipidemia, chronic bilateral lower extremity pain who is known to have Covid exposure at home with family members positive with Covid. Patient was having significant weakness or shortness of breath. When EMS arrived she was found to be hypoxic. Patient brought to the ER where she is evaluated. She appears to have bilateral pneumonia.  She initially required 2 L of oxygen in the emergency department.  Covid test resulted positive.  New events last 24 hours / Subjective: Patient evaluated with iPad interpreter.  She had been taking off her nonrebreather mask overnight.  Currently, she is sitting in a recliner, about to eat breakfast.  Only on high flow nasal cannula O2 with SPO2 82 to 87%.  Nurse tech at bedside states that after breakfast, she will be placed back on nonrebreather mask.  She states that she is very tired.  Assessment & Plan:   Principal Problem:   Pneumonia due to COVID-19 virus Active Problems:   HLD (hyperlipidemia)   Essential hypertension   CKD (chronic kidney disease), stage II   Diastolic dysfunction   Acute hypoxemic respiratory failure secondary to COVID-19  -In the emergency department, SPO2 70% on room air and required nonrebreather at 15 L, and now on NRB+HFNC.  She may need to upgrade to heated high flow oxygen -CTA negative for PE  COVID-19 Pneumonia -Tested positive on 1/26 -Continue Remdesivir 1/27 >> -Continue steroids 1/26 >>  -Continue baricitinib 1/27 >> -Also continue Rocephin and azithromycin due to elevated procalcitonin, initial concern for bacterial  -Supportive treatments as ordered, encourage IS, encourage prone positioning, encourage mobilization  -Due to elevated  D-dimer, check DVT ultrasound  COVID-19 Labs  Recent Labs    2021/01/16 2154 12/27/20 1029 12/28/20 1118 12/29/20 0523  DDIMER 1.60* 1.52* 1.19* 1.28*  FERRITIN 944*  --  905* 781*  LDH 249*  --  237*  --   CRP 13.6* 13.2* 7.7* 4.4*    Lab Results  Component Value Date   SARSCOV2NAA POSITIVE (A) 01/16/2021   SARSCOV2NAA NEGATIVE 05/29/2020    CKD stage IIIa -Continue to monitor BMP  History of CVA  -Residual dysphagia  Left thyroid nodule -Recommend thyroid ultrasound for work-up as an outpatient    DVT prophylaxis:  enoxaparin (LOVENOX) injection 40 mg Start: 12/27/20 1000  Code Status: DNR, confirmed with 2 daughters over the phone they desire natural death in case of significant decompensation Family Communication: No family at bedside, updated daughter over the phone Disposition Plan:  Status is: Inpatient  Remains inpatient appropriate because:Hemodynamically unstable   Dispo: The patient is from: Home              Anticipated d/c is to: To be determined              Anticipated d/c date is: > 3 days              Patient currently is not medically stable to d/c.   Difficult to place patient No   Antimicrobials:  Anti-infectives (From admission, onward)   Start     Dose/Rate Route Frequency Ordered Stop   12/28/20 1000  remdesivir 100 mg in sodium chloride 0.9 % 100 mL IVPB       "Followed by" Linked Group  Details   100 mg 200 mL/hr over 30 Minutes Intravenous Every 24 hours 12/27/20 1120 01/01/21 0959   12/27/20 2200  azithromycin (ZITHROMAX) 500 mg in sodium chloride 0.9 % 250 mL IVPB        500 mg 250 mL/hr over 60 Minutes Intravenous Every 24 hours 12/06/2020 2338 01/01/21 2159   12/27/20 2200  cefTRIAXone (ROCEPHIN) 2 g in sodium chloride 0.9 % 100 mL IVPB        2 g 200 mL/hr over 30 Minutes Intravenous Every 24 hours 12/23/2020 2338 01/01/21 2159   12/27/20 1200  remdesivir 200 mg in sodium chloride 0.9% 250 mL IVPB       "Followed by" Linked  Group Details   200 mg 580 mL/hr over 30 Minutes Intravenous Once 12/27/20 1120 12/27/20 1335   12/03/2020 2330  cefTRIAXone (ROCEPHIN) 2 g in sodium chloride 0.9 % 100 mL IVPB  Status:  Discontinued        2 g 200 mL/hr over 30 Minutes Intravenous Every 24 hours 12/12/2020 2326 12/12/2020 2337   12/19/2020 2330  azithromycin (ZITHROMAX) 500 mg in sodium chloride 0.9 % 250 mL IVPB  Status:  Discontinued        500 mg 250 mL/hr over 60 Minutes Intravenous Every 24 hours 12/25/2020 2326 12/27/2020 2336   12/18/2020 2300  cefTRIAXone (ROCEPHIN) 1 g in sodium chloride 0.9 % 100 mL IVPB        1 g 200 mL/hr over 30 Minutes Intravenous  Once 12/28/2020 2254 12/27/20 0036   12/09/2020 2300  azithromycin (ZITHROMAX) 500 mg in sodium chloride 0.9 % 250 mL IVPB        500 mg 250 mL/hr over 60 Minutes Intravenous  Once 12/05/2020 2254 12/27/20 0036       Objective: Vitals:   12/28/20 2300 12/29/20 0400 12/29/20 0413 12/29/20 1200  BP: 127/60 (!) 125/115 129/71   Pulse: 75 90 84   Resp: (!) 21 20 19    Temp: (!) 97.4 F (36.3 C)  (!) 97.4 F (36.3 C)   TempSrc: Oral  Oral   SpO2: 90% 90% 90% 90%  Weight:      Height:        Intake/Output Summary (Last 24 hours) at 12/29/2020 1255 Last data filed at 12/29/2020 12/31/2020 Gross per 24 hour  Intake 650 ml  Output 700 ml  Net -50 ml   Filed Weights   12/27/20 1159  Weight: 69 kg    Examination: General exam: Appears calm and comfortable  Respiratory system: Clear to auscultation anteriorly. Respiratory effort normal.  On high flow O2 at 15 L satting 80s  Cardiovascular system: S1 & S2 heard, RRR. No pedal edema. Gastrointestinal system: Abdomen is nondistended, soft and nontender. Normal bowel sounds heard. Central nervous system: Alert and oriented. Non focal exam. Speech clear  Extremities: Symmetric in appearance bilaterally  Skin: No rashes, lesions or ulcers on exposed skin  Psychiatry: Judgement and insight appear stable. Mood & affect appropriate.     Data Reviewed: I have personally reviewed following labs and imaging studies  CBC: Recent Labs  Lab 12/08/2020 2154 12/27/20 0448 12/28/20 1118 12/29/20 0523  WBC 6.4 5.7 7.7 10.5  NEUTROABS 4.4 4.9 6.0 8.9*  HGB 12.5 12.4 12.7 13.0  HCT 38.1 37.9 39.2 39.4  MCV 79.7* 81.0 80.0 79.6*  PLT 211 205 287 321   Basic Metabolic Panel: Recent Labs  Lab 12/25/2020 2154 12/27/20 0448 12/28/20 1118 12/29/20 0523  NA 127* 127* 134* 135  K 3.9 4.8 3.7 3.6  CL 94* 95* 99 99  CO2 21* 23 24 24   GLUCOSE 91 119* 146* 139*  BUN 18 16 22  26*  CREATININE 0.98 1.01* 0.87 0.86  CALCIUM 8.1* 7.8* 8.1* 8.0*   GFR: Estimated Creatinine Clearance: 33.8 mL/min (by C-G formula based on SCr of 0.86 mg/dL). Liver Function Tests: Recent Labs  Lab 12/06/2020 2154 12/27/20 0448 12/28/20 1118 12/29/20 0523  AST 56* 55* 43* 39  ALT 22 21 22 21   ALKPHOS 58 54 51 45  BILITOT 1.8* 1.3* 0.7 0.7  PROT 7.2 7.0 7.3 6.7  ALBUMIN 2.9* 2.8* 2.8* 2.7*   No results for input(s): LIPASE, AMYLASE in the last 168 hours. No results for input(s): AMMONIA in the last 168 hours. Coagulation Profile: No results for input(s): INR, PROTIME in the last 168 hours. Cardiac Enzymes: No results for input(s): CKTOTAL, CKMB, CKMBINDEX, TROPONINI in the last 168 hours. BNP (last 3 results) No results for input(s): PROBNP in the last 8760 hours. HbA1C: No results for input(s): HGBA1C in the last 72 hours. CBG: No results for input(s): GLUCAP in the last 168 hours. Lipid Profile: Recent Labs    12/15/2020 2154  TRIG 162*   Thyroid Function Tests: No results for input(s): TSH, T4TOTAL, FREET4, T3FREE, THYROIDAB in the last 72 hours. Anemia Panel: Recent Labs    12/28/20 1118 12/29/20 0523  FERRITIN 905* 781*   Sepsis Labs: Recent Labs  Lab 12/01/2020 2154 12/27/20 0039 12/28/20 1118 12/29/20 0523  PROCALCITON 0.38  --  0.20 0.11  LATICACIDVEN 1.3 1.0  --   --     Recent Results (from the past 240  hour(s))  SARS Coronavirus 2 by RT PCR (hospital order, performed in [facility] hospital lab) Nasopharyngeal Nasopharyngeal Swab     Status: Abnormal   Collection Time: 12/02/2020  9:54 PM   Specimen: Nasopharyngeal Swab  Result Value Ref Range Status   SARS Coronavirus 2 POSITIVE (A) NEGATIVE Final    Comment: CRITICAL RESULT CALLED TO, READ BACK BY AND VERIFIED WITH: NIELSON J. 1.26.22 @ 1133 BY MECIAL J. (NOTE) SARS-CoV-2 target nucleic acids are DETECTED  SARS-CoV-2 RNA is generally detectable in upper respiratory specimens  during the acute phase of infection.  Positive results are indicative  of the presence of the identified virus, but do not rule out bacterial infection or co-infection with other pathogens not detected by the test.  Clinical correlation with patient history and  other diagnostic information is necessary to determine patient infection status.  The expected result is negative.  Fact Sheet for Patients:   [facility]   Fact Sheet for Healthcare Providers:   12/28/20    This test is not yet approved or cleared by the 01-19-1972 FDA and  has been authorized for detection and/or diagnosis of SARS-CoV-2 by FDA under an Emergency Use Authorization (EUA).  This EUA will remain in effect (me aning this test can be used) for the duration of  the COVID-19 declaration under Section 564(b)(1) of the Act, 21 U.S.C. section 360-bbb-3(b)(1), unless the authorization is terminated or revoked sooner.  Performed at [facility], [address] [place], [place]   Blood Culture (routine x 2)     Status: None (Preliminary result)   Collection Time: 12/13/2020  9:54 PM   Specimen: BLOOD  Result Value Ref Range Status   Specimen Description   Final    BLOOD LEFT ANTECUBITAL Performed at [facility], [address] [place], [place] [address]    Special Requests    Final    BOTTLES DRAWN AEROBIC AND ANAEROBIC Blood Culture adequate volume Performed at [facility], [address], [place], [place] [address]    Culture   Final    NO GROWTH 1 DAY Performed at [facility], [address] [place], [place] [address]    Report Status PENDING  Incomplete  Blood Culture (routine x 2)     Status: None (Preliminary result)   Collection Time: 12/04/2020  9:54 PM   Specimen: BLOOD  Result Value Ref Range Status   Specimen Description   Final    BLOOD LEFT HAND Performed at [facility], [address] [place], [place] [address]    Special Requests   Final    BOTTLES DRAWN AEROBIC AND ANAEROBIC Blood Culture adequate volume Performed at [facility], [address] [place], [place] [address]    Culture   Final    NO GROWTH 1 DAY Performed at [facility], [address], [place], [place] [address]    Report Status PENDING  Incomplete      Radiology Studies: No results found.    Scheduled Meds: . [START ON 12/30/2020] baricitinib  4 mg Oral Daily  . enoxaparin (LOVENOX) injection  40 mg Subcutaneous Q24H  . methylPREDNISolone (SOLU-MEDROL) injection  40 mg Intravenous Q12H   Continuous Infusions: . azithromycin Stopped (12/28/20 2247)  . cefTRIAXone (ROCEPHIN)  IV Stopped (12/28/20 2300)  . remdesivir 100 mg in NS 100 mL 100 mg (12/29/20 1044)     LOS: 3 days      Time spent: 30 minutes   Noralee Stain, DO [facility] 12/29/2020, 12:55 PM   Available via Epic secure chat 7am-7pm After these hours, please refer to coverage provider listed on [email or web address]

## 2020-12-29 NOTE — Evaluation (Signed)
Clinical/Bedside Swallow Evaluation Patient Details  Name: Ann Coleman MRN: [id number] Date of Birth: Jun 11, 1924  Today's Date: 12/29/2020 Time: SLP Start Time (ACUTE ONLY): 1605 SLP Stop Time (ACUTE ONLY): 1630 SLP Time Calculation (min) (ACUTE ONLY): 25 min  Past Medical History:  Past Medical History:  Diagnosis Date  . High cholesterol   . Hypertension   . Stroke [facility])    Past Surgical History:  Past Surgical History:  Procedure Laterality Date  . NO PAST SURGERIES     HPI:  Pt is a [place]ian-speaking 87 y.o. female with medical history significant of multiple episode of CVA, essential hypertension, diastolic dysfunction, hyperlipidemia, chronic bilateral lower extremity pain who is known to have Covid exposure at home with family members positive with Covid. Patient was having significant weakness or shortness of breath. Pt admitted with acute resp failure due to COVID PNA   Assessment / Plan / Recommendation Clinical Impression  Patient presents with a mild oropharyngeal dysphagia however study limited due to patient's overall poor participation and refusal to try any solid textures. She stated she only wanted "rice". She was maintained at 86-89% oxygen saturations with nasal cannula and venti mask and when SLP applied light pressure to nasal bridge of mask, oxygen saturations increased to 92-95%. Patient accepted several straw sips of water and although she did exhibit a mildly prolonged oral phase and suspected swallow initiation delay, she did not exhibit any changes in respirations or other vitals, voice remained at baseline (clear, low intensity). Patient reported that her mouth was dry and SLP recommended she have more water but she then refused. Difficult to assess her cognition because of difficulty with interpreter hearing her and her hearing interpreter. SLP is recommending to continue on current diet to give patient more choices of foods (rice, etc) and to encourage intake. PO  intake must be ceased if patient's oxygen saturations are not maintained above 85%. (RN already fully aware of this). SLP Visit Diagnosis: Dysphagia, unspecified (R13.10)    Aspiration Risk  Mild aspiration risk    Diet Recommendation Dysphagia 2 (Fine chop);Dysphagia 3 (Mech soft);Thin liquid   Liquid Administration via: Cup Medication Administration: Whole meds with puree Supervision: Full supervision/cueing for compensatory strategies;Staff to assist with self feeding Compensations: Minimize environmental distractions;Slow rate;Small sips/bites Postural Changes: Seated upright at 90 degrees    Other  Recommendations Oral Care Recommendations: Oral care BID;Staff/trained caregiver to provide oral care   Follow up Recommendations None      Frequency and Duration min 1 x/week  1 week       Prognosis Prognosis for Safe Diet Advancement: Good      Swallow Study   General Date of Onset: 12/16/2020 HPI: Pt is a Cambodian-speaking 87 y.o. female with medical history significant of multiple episode of CVA, essential hypertension, diastolic dysfunction, hyperlipidemia, chronic bilateral lower extremity pain who is known to have Covid exposure at home with family members positive with Covid. Patient was having significant weakness or shortness of breath. Pt admitted with acute resp failure due to COVID PNA Type of Study: Bedside Swallow Evaluation Previous Swallow Assessment: None Diet Prior to this Study: Thin liquids;Dysphagia 3 (soft) Temperature Spikes Noted: No Respiratory Status: Nasal cannula;Venti-mask History of Recent Intubation: No Behavior/Cognition: Alert;Requires cueing;Pleasant mood Oral Cavity Assessment: Within Functional Limits Oral Care Completed by SLP: Yes Oral Cavity - Dentition: Edentulous Vision: Functional for self-feeding Self-Feeding Abilities: Total assist Patient Positioning: Upright in bed Baseline Vocal Quality: Low vocal intensity Volitional Cough:  Weak Volitional Swallow:  Unable to elicit    Oral/Motor/Sensory Function Overall Oral Motor/Sensory Function: Within functional limits   Ice Chips     Thin Liquid Thin Liquid: Impaired Oral Phase Functional Implications: Prolonged oral transit Pharyngeal  Phase Impairments: Suspected delayed Swallow    Nectar Thick     Honey Thick     Puree Puree: Not tested Other Comments: patient refused   Solid     Solid: Not tested     Angela Nevin, MA, CCC-SLP Speech Therapy

## 2020-12-29 NOTE — CV Procedure (Signed)
BLE venous duplex complete.  Results can be found under chart review under CV PROC. 12/29/2020 12:58 PM Tiffaney Heimann RVT, RDMS

## 2020-12-29 NOTE — Plan of Care (Signed)
  Problem: Education: Goal: Knowledge of risk factors and measures for prevention of condition will improve Outcome: Progressing   

## 2020-12-30 DIAGNOSIS — U071 COVID-19: Secondary | ICD-10-CM | POA: Diagnosis not present

## 2020-12-30 DIAGNOSIS — J1282 Pneumonia due to coronavirus disease 2019: Secondary | ICD-10-CM | POA: Diagnosis not present

## 2020-12-30 LAB — D-DIMER, QUANTITATIVE: D-Dimer, Quant: 2.97 ug/mL-FEU — ABNORMAL HIGH (ref 0.00–0.50)

## 2020-12-30 LAB — COMPREHENSIVE METABOLIC PANEL
ALT: 20 U/L (ref 0–44)
AST: 39 U/L (ref 15–41)
Albumin: 2.5 g/dL — ABNORMAL LOW (ref 3.5–5.0)
Alkaline Phosphatase: 44 U/L (ref 38–126)
Anion gap: 11 (ref 5–15)
BUN: 24 mg/dL — ABNORMAL HIGH (ref 8–23)
CO2: 23 mmol/L (ref 22–32)
Calcium: 7.9 mg/dL — ABNORMAL LOW (ref 8.9–10.3)
Chloride: 100 mmol/L (ref 98–111)
Creatinine, Ser: 0.83 mg/dL (ref 0.44–1.00)
GFR, Estimated: >60 mL/min (ref >60)
Glucose, Bld: 113 mg/dL — ABNORMAL HIGH (ref 70–99)
Potassium: 3.6 mmol/L (ref 3.5–5.1)
Sodium: 134 mmol/L — ABNORMAL LOW (ref 135–145)
Total Bilirubin: 0.7 mg/dL (ref 0.3–1.2)
Total Protein: 6.4 g/dL — ABNORMAL LOW (ref 6.5–8.1)

## 2020-12-30 LAB — CBC WITH DIFFERENTIAL/PLATELET
Abs Immature Granulocytes: 0.14 10*3/uL — ABNORMAL HIGH (ref 0.00–0.07)
Basophils Absolute: 0 10*3/uL (ref 0.0–0.1)
Basophils Relative: 0 %
Eosinophils Absolute: 0 10*3/uL (ref 0.0–0.5)
Eosinophils Relative: 0 %
HCT: 40.1 % (ref 36.0–46.0)
Hemoglobin: 13.2 g/dL (ref 12.0–15.0)
Immature Granulocytes: 1 %
Lymphocytes Relative: 2 %
Lymphs Abs: 0.4 10*3/uL — ABNORMAL LOW (ref 0.7–4.0)
MCH: 26.2 pg (ref 26.0–34.0)
MCHC: 32.9 g/dL (ref 30.0–36.0)
MCV: 79.7 fL — ABNORMAL LOW (ref 80.0–100.0)
Monocytes Absolute: 1.1 10*3/uL — ABNORMAL HIGH (ref 0.1–1.0)
Monocytes Relative: 7 %
Neutro Abs: 13.7 10*3/uL — ABNORMAL HIGH (ref 1.7–7.7)
Neutrophils Relative %: 90 %
Platelets: 347 10*3/uL (ref 150–400)
RBC: 5.03 MIL/uL (ref 3.87–5.11)
RDW: 14 % (ref 11.5–15.5)
WBC: 15.2 10*3/uL — ABNORMAL HIGH (ref 4.0–10.5)
nRBC: 0 % (ref 0.0–0.2)

## 2020-12-30 LAB — FERRITIN: Ferritin: 682 ng/mL — ABNORMAL HIGH (ref 11–307)

## 2020-12-30 LAB — C-REACTIVE PROTEIN: CRP: 6.3 mg/dL — ABNORMAL HIGH (ref <1.0)

## 2020-12-30 MED ORDER — TRAMADOL HCL 50 MG PO TABS
50.0000 mg | ORAL_TABLET | Freq: Four times a day (QID) | ORAL | Status: DC | PRN
  Administered 2020-12-30 – 2021-01-04 (×9): 50 mg via ORAL
  Filled 2020-12-30 (×9): qty 1

## 2020-12-30 MED ORDER — ALPRAZOLAM 0.25 MG PO TABS
0.2500 mg | ORAL_TABLET | Freq: Two times a day (BID) | ORAL | Status: DC | PRN
  Administered 2020-12-30 – 2021-01-04 (×7): 0.25 mg via ORAL
  Filled 2020-12-30 (×7): qty 1

## 2020-12-30 NOTE — Plan of Care (Signed)
  Problem: Respiratory: Goal: Will maintain a patent airway Outcome: Progressing   Problem: Activity: Goal: Risk for activity intolerance will decrease Outcome: Progressing   Problem: Pain Managment: Goal: General experience of comfort will improve Outcome: Progressing   Problem: Safety: Goal: Ability to remain free from injury will improve Outcome: Progressing   Problem: Skin Integrity: Goal: Risk for impaired skin integrity will decrease Outcome: Progressing   

## 2020-12-30 NOTE — Progress Notes (Signed)
Pt attempting to pull off NRB and Victor, appears anxious and states via daughter on phone that her head hurts. MD paged. New orders placed. [facility] approved patient visitor due to confusion and language barrier. Daughter, [name] called and came to patients bedside.   >>After PRN xanax and tramadol given and daughter at bedside, patient is now resting comfortably in bed with O2 saturation of 92-96% on 15L HFNC and 15L NRB, HR 90, BP 104/93.

## 2020-12-30 NOTE — Progress Notes (Signed)
Taken mittens off with mouth and oxygen off again. Placed mittens on and oxygen.on. Will cont to assess Resp status.

## 2020-12-30 NOTE — Progress Notes (Signed)
  PROGRESS NOTE  Checked in on patient this afternoon. Daughter has been allowed to come to bedside. Patient resting comfortably, heart rate in the 80s, oxygen saturation 90-91% on high flow and NRB. Per daughter, patient has no appetite, has been spitting out drinks that daughter has brought her. Updated daughter plan of care, let her know that inflammatory markers have increased today and we will have to keep a very close eye on her. Patient remains at high risk for decompensation.   Noralee Stain, DO [facility] 12/30/2020, 2:35 PM  Available via Epic secure chat 7am-7pm After these hours, please refer to coverage provider listed on [email or web address]

## 2020-12-30 NOTE — Progress Notes (Signed)
PROGRESS NOTE    Ann Coleman  ZOX:[id number] DOB: 06/03/1924 DOA: October 18, 2021 PCP: Ann Coleman, No Pcp Per     Brief Narrative:  Ann Coleman is a Cambodian-speaking 87 y.o. female with medical history significant of multiple episode of CVA, essential hypertension, diastolic dysfunction, hyperlipidemia, chronic bilateral lower extremity pain who is known to have Covid exposure at home with family members positive with Covid. Ann Coleman was having significant weakness or shortness of breath. When EMS arrived she was found to be hypoxic. Ann Coleman brought to the ER where she is evaluated. She appears to have bilateral pneumonia.  She initially required 2 L of oxygen in the emergency department.  Covid test resulted positive.  New events last 24 hours / Subjective: Could not get the audio interpreter to connect today. She appears without distress, takes her NRB off to drink juice with desats in to the 80s.   Assessment & Plan:   Principal Problem:   Pneumonia due to COVID-19 virus Active Problems:   HLD (hyperlipidemia)   Essential hypertension   CKD (chronic kidney disease), stage II   Diastolic dysfunction   Acute hypoxemic respiratory failure secondary to COVID-19  -In the emergency department, SPO2 70% on room air and required nonrebreather at 15 L, and now on NRB+HFNC.  She may need to upgrade to heated high flow oxygen -CTA negative for PE  COVID-19 Pneumonia -Tested positive on 1/26 -Continue Remdesivir 1/27 >> -Continue steroids 1/26 >>  -Continue baricitinib 1/27 >> -Also continue Rocephin and azithromycin due to elevated procalcitonin, initial concern for bacterial  -Supportive treatments as ordered, encourage IS, encourage prone positioning, encourage mobilization  -Due to elevated D-dimer, DVT ultrasound negative to DVT   COVID-19 Labs  Recent Labs    12/28/20 1118 12/29/20 0523 12/30/20 0515  DDIMER 1.19* 1.28* 2.97*  FERRITIN 905* 781* 682*  LDH 237*  --   --   CRP 7.7* 4.4*  6.3*    Lab Results  Component Value Date   SARSCOV2NAA POSITIVE (A) 0November 18, 2022   SARSCOV2NAA NEGATIVE 05/29/2020    CKD stage IIIa -Continue to monitor BMP  History of CVA  -Residual dysphagia  Left thyroid nodule -Recommend thyroid ultrasound for work-up as an outpatient    DVT prophylaxis:  enoxaparin (LOVENOX) injection 40 mg Start: 12/27/20 1000  Code Status: DNR, confirmed with 2 daughters over the phone they desire natural death in case of significant decompensation Family Communication: No family at bedside, left voicemail for daughter  Disposition Plan:  Status is: Inpatient  Remains inpatient appropriate because:Hemodynamically unstable   Dispo: The Ann Coleman is from: Home              Anticipated d/c is to: To be determined              Anticipated d/c date is: > 3 days              Ann Coleman currently is not medically stable to d/c.   Difficult to place Ann Coleman No   Antimicrobials:  Anti-infectives (From admission, onward)   Start     Dose/Rate Route Frequency Ordered Stop   12/28/20 1000  remdesivir 100 mg in sodium chloride 0.9 % 100 mL IVPB       "Followed by" Linked Group Details   100 mg 200 mL/hr over 30 Minutes Intravenous Every 24 hours 12/27/20 1120 01/01/21 0959   12/27/20 2200  azithromycin (ZITHROMAX) 500 mg in sodium chloride 0.9 % 250 mL IVPB        500  mg 250 mL/hr over 60 Minutes Intravenous Every 24 hours January 19, 2021 2338 01/01/21 2159   12/27/20 2200  cefTRIAXone (ROCEPHIN) 2 g in sodium chloride 0.9 % 100 mL IVPB        2 g 200 mL/hr over 30 Minutes Intravenous Every 24 hours 2021/01/19 2338 01/01/21 2159   12/27/20 1200  remdesivir 200 mg in sodium chloride 0.9% 250 mL IVPB       "Followed by" Linked Group Details   200 mg 580 mL/hr over 30 Minutes Intravenous Once 12/27/20 1120 12/27/20 1335   2021-01-19 2330  cefTRIAXone (ROCEPHIN) 2 g in sodium chloride 0.9 % 100 mL IVPB  Status:  Discontinued        2 g 200 mL/hr over 30 Minutes  Intravenous Every 24 hours 01/19/2021 2326 2021/01/19 2337   19-Jan-2021 2330  azithromycin (ZITHROMAX) 500 mg in sodium chloride 0.9 % 250 mL IVPB  Status:  Discontinued        500 mg 250 mL/hr over 60 Minutes Intravenous Every 24 hours Jan 19, 2021 2326 Jan 19, 2021 2336   January 19, 2021 2300  cefTRIAXone (ROCEPHIN) 1 g in sodium chloride 0.9 % 100 mL IVPB        1 g 200 mL/hr over 30 Minutes Intravenous  Once 01/19/21 2254 12/27/20 0036   01/19/2021 2300  azithromycin (ZITHROMAX) 500 mg in sodium chloride 0.9 % 250 mL IVPB        500 mg 250 mL/hr over 60 Minutes Intravenous  Once 01-19-21 2254 12/27/20 0036       Objective: Vitals:   12/29/20 2013 12/30/20 0345 12/30/20 0500 12/30/20 1152  BP: (!) 128/93   97/81  Pulse:      Resp:      Temp: 97.7 F (36.5 C) (!) 97.5 F (36.4 C) 97.6 F (36.4 C) 97.9 F (36.6 C)  TempSrc: Oral   Axillary  SpO2:      Weight:      Height:        Intake/Output Summary (Last 24 hours) at 12/30/2020 1210 Last data filed at 12/30/2020 4403 Gross per 24 hour  Intake 237.57 ml  Output --  Net 237.57 ml   Filed Weights   12/27/20 1159  Weight: 69 kg    Examination: General exam: Appears calm and comfortable  Respiratory system: Clear to auscultation anteriorly. Respiratory effort normal. Cardiovascular system: S1 & S2 heard, tachycardic regular rhythm. No pedal edema. Gastrointestinal system: Abdomen is nondistended, soft and nontender. Normal bowel sounds heard. Central nervous system: Alert. Non focal exam. Extremities: Symmetric in appearance bilaterally  Skin: No rashes, lesions or ulcers on exposed skin    Data Reviewed: I have personally reviewed following labs and imaging studies  CBC: Recent Labs  Lab 2021-01-19 2154 12/27/20 0448 12/28/20 1118 12/29/20 0523 12/30/20 0515  WBC 6.4 5.7 7.7 10.5 15.2*  NEUTROABS 4.4 4.9 6.0 8.9* 13.7*  HGB 12.5 12.4 12.7 13.0 13.2  HCT 38.1 37.9 39.2 39.4 40.1  MCV 79.7* 81.0 80.0 79.6* 79.7*  PLT 211 205 287  321 347   Basic Metabolic Panel: Recent Labs  Lab 01-19-2021 2154 12/27/20 0448 12/28/20 1118 12/29/20 0523 12/30/20 0515  NA 127* 127* 134* 135 134*  K 3.9 4.8 3.7 3.6 3.6  CL 94* 95* 99 99 100  CO2 21* 23 24 24 23   GLUCOSE 91 119* 146* 139* 113*  BUN 18 16 22  26* 24*  CREATININE 0.98 1.01* 0.87 0.86 0.83  CALCIUM 8.1* 7.8* 8.1* 8.0* 7.9*   GFR: Estimated Creatinine  Clearance: 35 mL/min (by C-G formula based on SCr of 0.83 mg/dL). Liver Function Tests: Recent Labs  Lab 12/27/20 2154 12/27/20 0448 12/28/20 1118 12/29/20 0523 12/30/20 0515  AST 56* 55* 43* 39 39  ALT 22 21 22 21 20   ALKPHOS 58 54 51 45 44  BILITOT 1.8* 1.3* 0.7 0.7 0.7  PROT 7.2 7.0 7.3 6.7 6.4*  ALBUMIN 2.9* 2.8* 2.8* 2.7* 2.5*   No results for input(s): LIPASE, AMYLASE in the last 168 hours. No results for input(s): AMMONIA in the last 168 hours. Coagulation Profile: No results for input(s): INR, PROTIME in the last 168 hours. Cardiac Enzymes: No results for input(s): CKTOTAL, CKMB, CKMBINDEX, TROPONINI in the last 168 hours. BNP (last 3 results) No results for input(s): PROBNP in the last 8760 hours. HbA1C: No results for input(s): HGBA1C in the last 72 hours. CBG: No results for input(s): GLUCAP in the last 168 hours. Lipid Profile: No results for input(s): CHOL, HDL, LDLCALC, TRIG, CHOLHDL, LDLDIRECT in the last 72 hours. Thyroid Function Tests: No results for input(s): TSH, T4TOTAL, FREET4, T3FREE, THYROIDAB in the last 72 hours. Anemia Panel: Recent Labs    12/29/20 0523 12/30/20 0515  FERRITIN 781* 682*   Sepsis Labs: Recent Labs  Lab 12-27-20 2154 12/27/20 0039 12/28/20 1118 12/29/20 0523  PROCALCITON 0.38  --  0.20 0.11  LATICACIDVEN 1.3 1.0  --   --     Recent Results (from the past 240 hour(s))  SARS Coronavirus 2 by RT PCR (hospital order, performed in [facility] hospital lab) Nasopharyngeal Nasopharyngeal Swab     Status: Abnormal   Collection Time: 27-Dec-2020  9:54 PM    Specimen: Nasopharyngeal Swab  Result Value Ref Range Status   SARS Coronavirus 2 POSITIVE (A) NEGATIVE Final    Comment: CRITICAL RESULT CALLED TO, READ BACK BY AND VERIFIED WITH: NIELSON J. 2020/12/27 @ 1133 BY MECIAL J. (NOTE) SARS-CoV-2 target nucleic acids are DETECTED  SARS-CoV-2 RNA is generally detectable in upper respiratory specimens  during the acute phase of infection.  Positive results are indicative  of the presence of the identified virus, but do not rule out bacterial infection or co-infection with other pathogens not detected by the test.  Clinical correlation with Ann Coleman history and  other diagnostic information is necessary to determine Ann Coleman infection status.  The expected result is negative.  Fact Sheet for Patients:   01-19-1972   Fact Sheet for Healthcare Providers:   BoilerBrush.com.cy    This test is not yet approved or cleared by the https://pope.com/ FDA and  has been authorized for detection and/or diagnosis of SARS-CoV-2 by FDA under an Emergency Use Authorization (EUA).  This EUA will remain in effect (me aning this test can be used) for the duration of  the COVID-19 declaration under Section 564(b)(1) of the Act, 21 U.S.C. section 360-bbb-3(b)(1), unless the authorization is terminated or revoked sooner.  Performed at [facility], [address], [place], [place]   Blood Culture (routine x 2)     Status: None (Preliminary result)   Collection Time: Dec 27, 2020  9:54 PM   Specimen: BLOOD  Result Value Ref Range Status   Specimen Description   Final    BLOOD LEFT ANTECUBITAL Performed at [facility], [address] [place], [place]    Special Requests   Final    BOTTLES DRAWN AEROBIC AND ANAEROBIC Blood Culture adequate volume Performed at [facility], [address]  [place], [place] [address]    Culture   Final     NO GROWTH 3 DAYS Performed at [facility], [address] [place], [place] [address]    Report Status PENDING  Incomplete  Blood Culture (routine x 2)     Status: None (Preliminary result)   Collection Time: Jan 18, 2021  9:54 PM   Specimen: BLOOD  Result Value Ref Range Status   Specimen Description   Final    BLOOD LEFT HAND Performed at [facility], [address] [place], [place] [address]    Special Requests   Final    BOTTLES DRAWN AEROBIC AND ANAEROBIC Blood Culture adequate volume Performed at [facility], [address], [place], [place] [address]    Culture   Final    NO GROWTH 3 DAYS Performed at [facility], [address], [place], [place] [address]    Report Status PENDING  Incomplete      Radiology Studies: VAS [place] LOWER EXTREMITY VENOUS (DVT)  Result Date: 12/29/2020  Lower Venous DVT Study Indications: Covid+.  Limitations: Poor ultrasound/tissue interface and Ann Coleman position. Comparison Study: No prior exams Performing Technologist: Ernestene Mention  Examination Guidelines: A complete evaluation includes B-mode imaging, spectral Doppler, color Doppler, and power Doppler as needed of all accessible portions of each vessel. Bilateral testing is considered an integral part of a complete examination. Limited examinations for reoccurring indications may be performed as noted. The reflux portion of the exam is performed with the Ann Coleman in reverse Trendelenburg.  +---------+---------------+---------+-----------+----------+-------------------+ RIGHT    CompressibilityPhasicitySpontaneityPropertiesThrombus Aging      +---------+---------------+---------+-----------+----------+-------------------+ CFV      Full           Yes      Yes                                      +---------+---------------+---------+-----------+----------+-------------------+ SFJ      Full                                                              +---------+---------------+---------+-----------+----------+-------------------+ FV Prox  Full           Yes      Yes                                      +---------+---------------+---------+-----------+----------+-------------------+ FV Mid   Full           Yes      Yes                                      +---------+---------------+---------+-----------+----------+-------------------+ FV DistalFull           Yes      Yes                                      +---------+---------------+---------+-----------+----------+-------------------+ PFV      Full                                                             +---------+---------------+---------+-----------+----------+-------------------+  POP      Full           Yes      Yes                                      +---------+---------------+---------+-----------+----------+-------------------+ PTV      Full                                                             +---------+---------------+---------+-----------+----------+-------------------+ PERO     Full                                         Not well visualized +---------+---------------+---------+-----------+----------+-------------------+   +---------+---------------+---------+-----------+----------+------------------+ LEFT     CompressibilityPhasicitySpontaneityPropertiesThrombus Aging     +---------+---------------+---------+-----------+----------+------------------+ CFV      Full           Yes      Yes                                     +---------+---------------+---------+-----------+----------+------------------+ SFJ      Full                                                            +---------+---------------+---------+-----------+----------+------------------+ FV Prox  Full           Yes      Yes                                     +---------+---------------+---------+-----------+----------+------------------+  FV Mid   Full           Yes      Yes                                     +---------+---------------+---------+-----------+----------+------------------+ FV Distal                                             Not seen on this                                                         exam               +---------+---------------+---------+-----------+----------+------------------+ PFV      Full                                                            +---------+---------------+---------+-----------+----------+------------------+  POP      Full           Yes      Yes                                     +---------+---------------+---------+-----------+----------+------------------+ PTV      Full                                         Not well                                                                 visualized         +---------+---------------+---------+-----------+----------+------------------+ PERO                                                  Not seen on this                                                         exam               +---------+---------------+---------+-----------+----------+------------------+ LLE difficult to image due to Ann Coleman positioning. Ann Coleman unable to speak english or hear interpreter so unable to given exam instructions.  Left Technical Findings: Not visualized segments include Distal femoral & peroneal veins.   Summary: RIGHT: - There is no evidence of deep vein thrombosis in the lower extremity.  - A cystic structure is found in the popliteal fossa.  LEFT: - There is no evidence of deep vein thrombosis in the lower extremity. However, portions of this examination were limited- see technologist comments above.  - No cystic structure found in the popliteal fossa.  *See table(s) above for measurements and observations. Electronically signed by Heath Lark on 12/29/2020 at 2:46:07 PM.    Final       Scheduled  Meds: . baricitinib  4 mg Oral Daily  . enoxaparin (LOVENOX) injection  40 mg Subcutaneous Q24H  . methylPREDNISolone (SOLU-MEDROL) injection  40 mg Intravenous Q12H   Continuous Infusions: . azithromycin Stopped (12/29/20 2350)  . cefTRIAXone (ROCEPHIN)  IV Stopped (12/29/20 2218)  . remdesivir 100 mg in NS 100 mL 100 mg (12/30/20 1028)     LOS: 4 days      Time spent: 30 minutes   Noralee Stain, DO [facility] 12/30/2020, 12:10 PM   Available via Epic secure chat 7am-7pm After these hours, please refer to coverage provider listed on [email or web address]

## 2020-12-30 NOTE — Progress Notes (Signed)
Keep taking off oxygen. Placed on sat drops to 70's. Will cont to assess Resp Status.

## 2020-12-30 NOTE — Progress Notes (Signed)
To room multi times taking off oxygen sats dropped. Placed mittens on both times. Will cont assess Resap status.

## 2020-12-31 DIAGNOSIS — U071 COVID-19: Secondary | ICD-10-CM | POA: Diagnosis not present

## 2020-12-31 DIAGNOSIS — J1282 Pneumonia due to coronavirus disease 2019: Secondary | ICD-10-CM | POA: Diagnosis not present

## 2020-12-31 LAB — C-REACTIVE PROTEIN: CRP: 11.6 mg/dL — ABNORMAL HIGH (ref <1.0)

## 2020-12-31 LAB — COMPREHENSIVE METABOLIC PANEL
ALT: 26 U/L (ref 0–44)
AST: 51 U/L — ABNORMAL HIGH (ref 15–41)
Albumin: 2.5 g/dL — ABNORMAL LOW (ref 3.5–5.0)
Alkaline Phosphatase: 50 U/L (ref 38–126)
Anion gap: 11 (ref 5–15)
BUN: 30 mg/dL — ABNORMAL HIGH (ref 8–23)
CO2: 23 mmol/L (ref 22–32)
Calcium: 7.7 mg/dL — ABNORMAL LOW (ref 8.9–10.3)
Chloride: 102 mmol/L (ref 98–111)
Creatinine, Ser: 0.81 mg/dL (ref 0.44–1.00)
GFR, Estimated: >60 mL/min (ref >60)
Glucose, Bld: 116 mg/dL — ABNORMAL HIGH (ref 70–99)
Potassium: 3.6 mmol/L (ref 3.5–5.1)
Sodium: 136 mmol/L (ref 135–145)
Total Bilirubin: 0.8 mg/dL (ref 0.3–1.2)
Total Protein: 6.4 g/dL — ABNORMAL LOW (ref 6.5–8.1)

## 2020-12-31 LAB — CBC WITH DIFFERENTIAL/PLATELET
Abs Immature Granulocytes: 0.11 10*3/uL — ABNORMAL HIGH (ref 0.00–0.07)
Basophils Absolute: 0 10*3/uL (ref 0.0–0.1)
Basophils Relative: 0 %
Eosinophils Absolute: 0 10*3/uL (ref 0.0–0.5)
Eosinophils Relative: 0 %
HCT: 39.9 % (ref 36.0–46.0)
Hemoglobin: 13.2 g/dL (ref 12.0–15.0)
Immature Granulocytes: 1 %
Lymphocytes Relative: 2 %
Lymphs Abs: 0.4 10*3/uL — ABNORMAL LOW (ref 0.7–4.0)
MCH: 25.8 pg — ABNORMAL LOW (ref 26.0–34.0)
MCHC: 33.1 g/dL (ref 30.0–36.0)
MCV: 78.1 fL — ABNORMAL LOW (ref 80.0–100.0)
Monocytes Absolute: 0.8 10*3/uL (ref 0.1–1.0)
Monocytes Relative: 5 %
Neutro Abs: 16.5 10*3/uL — ABNORMAL HIGH (ref 1.7–7.7)
Neutrophils Relative %: 92 %
Platelets: 332 10*3/uL (ref 150–400)
RBC: 5.11 MIL/uL (ref 3.87–5.11)
RDW: 13.8 % (ref 11.5–15.5)
WBC: 17.8 10*3/uL — ABNORMAL HIGH (ref 4.0–10.5)
nRBC: 0 % (ref 0.0–0.2)

## 2020-12-31 LAB — FERRITIN: Ferritin: 756 ng/mL — ABNORMAL HIGH (ref 11–307)

## 2020-12-31 LAB — D-DIMER, QUANTITATIVE: D-Dimer, Quant: 3.74 ug/mL-FEU — ABNORMAL HIGH (ref 0.00–0.50)

## 2020-12-31 MED ORDER — ONDANSETRON HCL 4 MG/2ML IJ SOLN: 4.0000 mg | Freq: Four times a day (QID) | INTRAMUSCULAR | Status: DC | PRN

## 2020-12-31 NOTE — Progress Notes (Addendum)
Civil engineer, contracting [facility]) Hospital Liaison: RN note     Notified by Transition of Care Manger  of patient/family request for [facility] services at home after discharge. Chart and patient information under review by [facility] physician. Hospice eligibility pending currently.     Writer spoke with daughter, [name]  to initiate education related to hospice philosophy, services and team approach to care.[name]  verbalized understanding of information given. Per discussion, plan is for discharge to home by PTAR.   Please send signed and completed DNR form home with patient/family. Patient will need prescriptions for discharge comfort medications.      DME needs have been discussed, patient currently has the following equipment in the home: hospital bed.  Patient/family requests the following DME for delivery to the home: oxygetn. [facility] equipment manager has been notified and will contact DME provider to arrange delivery to the home. Home address has been verified and is correct in the chart.  [name] is the family member to contact to arrange time of delivery.      [facility] aware of the above. Please notify [facility] when patient is ready to leave the unit at discharge. (Call [phone] or [phone] after 5pm.) [facility] information and contact numbers given to [name].       A Please do not hesitate to call with questions.     Thank you,     Elsie Saas, RN, [facility]       [facility] Div Liaison (listed on [facility] under Hospice [facility])     [phone]

## 2020-12-31 NOTE — Progress Notes (Signed)
PROGRESS NOTE    Ann Coleman  QMV:[id number] DOB: 04/07/1924 DOA: 12/10/2020 PCP: Patient, No Pcp Per     Brief Narrative:  Ann Coleman is a Cambodian-speaking 87 y.o. female with medical history significant of multiple episode of CVA, essential hypertension, diastolic dysfunction, hyperlipidemia, chronic bilateral lower extremity pain who is known to have Covid exposure at home with family members positive with Covid. Patient was having significant weakness or shortness of breath. When EMS arrived she was found to be hypoxic. Patient brought to the ER where she is evaluated. She appears to have bilateral pneumonia.  She initially required 2 L of oxygen in the emergency department.  Covid test resulted positive. Patient continued to decompensate requiring 15 L high flow oxygen as well as nonrebreather. Due to language barrier and hearing loss, patient has continued to take her oxygen mask off.  New events last 24 hours / Subjective: Could not get the audio interpreter to connect today. Upon my arrival, patient satting in the 3s. Her nonrebreather has been taken off at her bedside, high flow oxygen on her forehead. Both oxygen devices were replaced with slow improvement in saturation to the mid 80s.  Assessment & Plan:   Principal Problem:   Pneumonia due to COVID-19 virus Active Problems:   HLD (hyperlipidemia)   Essential hypertension   CKD (chronic kidney disease), stage II   Diastolic dysfunction   Acute hypoxemic respiratory failure secondary to COVID-19  -In the emergency department, SPO2 70% on room air and required nonrebreather at 15 L, and now on NRB+HFNC.  She may need to upgrade to heated high flow oxygen -CTA negative for PE  COVID-19 Pneumonia -Tested positive on 1/26 -Continue Remdesivir 1/27 >> -Continue steroids 1/26 >>  -Continue baricitinib 1/27 >> -Also continue Rocephin and azithromycin due to elevated procalcitonin, initial concern for bacterial  -Supportive treatments  as ordered, encourage IS, encourage prone positioning, encourage mobilization  -Due to elevated D-dimer, DVT ultrasound negative to DVT   COVID-19 Labs  Recent Labs    12/29/20 0523 12/30/20 0515 12/31/20 0436  DDIMER 1.28* 2.97* 3.74*  FERRITIN 781* 682* 756*  CRP 4.4* 6.3* 11.6*    Lab Results  Component Value Date   SARSCOV2NAA POSITIVE (A) 12/03/2020   SARSCOV2NAA NEGATIVE 05/29/2020    CKD stage IIIa -Continue to monitor BMP  History of CVA  -Residual dysphagia  Left thyroid nodule -Recommend thyroid ultrasound for work-up as an outpatient  Goals of care -Despite aggressive treatment for COVID-19, patient continues to decompensate, requiring high levels of oxygen, inflammatory markers increasing. Palliative care medicine was consulted. Discussing with daughter, she stated that she preferred to take patient home. Case manager to order home hospice.    DVT prophylaxis:  enoxaparin (LOVENOX) injection 40 mg Start: 12/27/20 1000  Code Status: DNR, confirmed with 2 daughters over the phone they desire natural death in case of significant decompensation Family Communication: Discussed with daughter over the phone x2  Disposition Plan:  Status is: Inpatient  Remains inpatient appropriate because:Hemodynamically unstable   Dispo: The patient is from: Home              Anticipated d/c is to: To be determined              Anticipated d/c date is: > 3 days              Patient currently is not medically stable to d/c.   Difficult to place patient No   Antimicrobials:  Anti-infectives (From  admission, onward)   Start     Dose/Rate Route Frequency Ordered Stop   12/28/20 1000  remdesivir 100 mg in sodium chloride 0.9 % 100 mL IVPB       "Followed by" Linked Group Details   100 mg 200 mL/hr over 30 Minutes Intravenous Every 24 hours 12/27/20 1120 12/31/20 1243   12/27/20 2200  azithromycin (ZITHROMAX) 500 mg in sodium chloride 0.9 % 250 mL IVPB        500 mg 250  mL/hr over 60 Minutes Intravenous Every 24 hours 12/09/2020 2338 01/01/21 2159   12/27/20 2200  cefTRIAXone (ROCEPHIN) 2 g in sodium chloride 0.9 % 100 mL IVPB        2 g 200 mL/hr over 30 Minutes Intravenous Every 24 hours 12/06/2020 2338 01/01/21 2159   12/27/20 1200  remdesivir 200 mg in sodium chloride 0.9% 250 mL IVPB       "Followed by" Linked Group Details   200 mg 580 mL/hr over 30 Minutes Intravenous Once 12/27/20 1120 12/27/20 1335   12/03/2020 2330  cefTRIAXone (ROCEPHIN) 2 g in sodium chloride 0.9 % 100 mL IVPB  Status:  Discontinued        2 g 200 mL/hr over 30 Minutes Intravenous Every 24 hours 12/18/2020 2326 12/11/2020 2337   12/25/2020 2330  azithromycin (ZITHROMAX) 500 mg in sodium chloride 0.9 % 250 mL IVPB  Status:  Discontinued        500 mg 250 mL/hr over 60 Minutes Intravenous Every 24 hours 12/10/2020 2326 12/05/2020 2336   12/28/2020 2300  cefTRIAXone (ROCEPHIN) 1 g in sodium chloride 0.9 % 100 mL IVPB        1 g 200 mL/hr over 30 Minutes Intravenous  Once 12/13/2020 2254 12/27/20 0036   12/27/2020 2300  azithromycin (ZITHROMAX) 500 mg in sodium chloride 0.9 % 250 mL IVPB        500 mg 250 mL/hr over 60 Minutes Intravenous  Once 12/24/2020 2254 12/27/20 0036       Objective: Vitals:   12/30/20 1950 12/30/20 2000 12/31/20 0700 12/31/20 0852  BP:  (!) 155/89 (!) 158/89   Pulse:  (!) 110 (!) 113 (!) 101  Resp:  20 20 20   Temp:  97.7 F (36.5 C) 98.7 F (37.1 C)   TempSrc:  Axillary Axillary   SpO2: 92% 90% (!) 87% (!) 88%  Weight:      Height:        Intake/Output Summary (Last 24 hours) at 12/31/2020 1300 Last data filed at 12/31/2020 0604 Gross per 24 hour  Intake 200 ml  Output 350 ml  Net -150 ml   Filed Weights   12/27/20 1159  Weight: 69 kg    Examination: General exam: Appears calm but restless Respiratory system: Diminished breath sounds Cardiovascular system: S1 & S2 heard, tachycardic, regular rhythm. No pedal edema. Gastrointestinal system: Abdomen is  nondistended, soft Central nervous system: Alert Extremities: Symmetric in appearance bilaterally  Skin: No rashes, lesions or ulcers on exposed skin    Data Reviewed: I have personally reviewed following labs and imaging studies  CBC: Recent Labs  Lab 12/27/20 0448 12/28/20 1118 12/29/20 0523 12/30/20 0515 12/31/20 0436  WBC 5.7 7.7 10.5 15.2* 17.8*  NEUTROABS 4.9 6.0 8.9* 13.7* 16.5*  HGB 12.4 12.7 13.0 13.2 13.2  HCT 37.9 39.2 39.4 40.1 39.9  MCV 81.0 80.0 79.6* 79.7* 78.1*  PLT 205 287 321 347 332   Basic Metabolic Panel: Recent Labs  Lab 12/27/20 0448  12/28/20 1118 12/29/20 0523 12/30/20 0515 12/31/20 0436  NA 127* 134* 135 134* 136  K 4.8 3.7 3.6 3.6 3.6  CL 95* 99 99 100 102  CO2 23 24 24 23 23   GLUCOSE 119* 146* 139* 113* 116*  BUN 16 22 26* 24* 30*  CREATININE 1.01* 0.87 0.86 0.83 0.81  CALCIUM 7.8* 8.1* 8.0* 7.9* 7.7*   GFR: Estimated Creatinine Clearance: 35.9 mL/min (by C-G formula based on SCr of 0.81 mg/dL). Liver Function Tests: Recent Labs  Lab 12/27/20 0448 12/28/20 1118 12/29/20 0523 12/30/20 0515 12/31/20 0436  AST 55* 43* 39 39 51*  ALT 21 22 21 20 26   ALKPHOS 54 51 45 44 50  BILITOT 1.3* 0.7 0.7 0.7 0.8  PROT 7.0 7.3 6.7 6.4* 6.4*  ALBUMIN 2.8* 2.8* 2.7* 2.5* 2.5*   No results for input(s): LIPASE, AMYLASE in the last 168 hours. No results for input(s): AMMONIA in the last 168 hours. Coagulation Profile: No results for input(s): INR, PROTIME in the last 168 hours. Cardiac Enzymes: No results for input(s): CKTOTAL, CKMB, CKMBINDEX, TROPONINI in the last 168 hours. BNP (last 3 results) No results for input(s): PROBNP in the last 8760 hours. HbA1C: No results for input(s): HGBA1C in the last 72 hours. CBG: No results for input(s): GLUCAP in the last 168 hours. Lipid Profile: No results for input(s): CHOL, HDL, LDLCALC, TRIG, CHOLHDL, LDLDIRECT in the last 72 hours. Thyroid Function Tests: No results for input(s): TSH, T4TOTAL,  FREET4, T3FREE, THYROIDAB in the last 72 hours. Anemia Panel: Recent Labs    12/30/20 0515 12/31/20 0436  FERRITIN 682* 756*   Sepsis Labs: Recent Labs  Lab 12/08/2020 2154 12/27/20 0039 12/28/20 1118 12/29/20 0523  PROCALCITON 0.38  --  0.20 0.11  LATICACIDVEN 1.3 1.0  --   --     Recent Results (from the past 240 hour(s))  SARS Coronavirus 2 by RT PCR (hospital order, performed in [facility] hospital lab) Nasopharyngeal Nasopharyngeal Swab     Status: Abnormal   Collection Time: 12/24/2020  9:54 PM   Specimen: Nasopharyngeal Swab  Result Value Ref Range Status   SARS Coronavirus 2 POSITIVE (A) NEGATIVE Final    Comment: CRITICAL RESULT CALLED TO, READ BACK BY AND VERIFIED WITH: NIELSON J. 1.26.22 @ 1133 BY MECIAL J. (NOTE) SARS-CoV-2 target nucleic acids are DETECTED  SARS-CoV-2 RNA is generally detectable in upper respiratory specimens  during the acute phase of infection.  Positive results are indicative  of the presence of the identified virus, but do not rule out bacterial infection or co-infection with other pathogens not detected by the test.  Clinical correlation with patient history and  other diagnostic information is necessary to determine patient infection status.  The expected result is negative.  Fact Sheet for Patients:   12/28/20   Fact Sheet for Healthcare Providers:   01-19-1972    This test is not yet approved or cleared by the [employer] FDA and  has been authorized for detection and/or diagnosis of SARS-CoV-2 by FDA under an Emergency Use Authorization (EUA).  This EUA will remain in effect (me aning this test can be used) for the duration of  the COVID-19 declaration under Section 564(b)(1) of the Act, 21 U.S.C. section 360-bbb-3(b)(1), unless the authorization is terminated or revoked sooner.  Performed at [facility], [address] [place], [place]  [place]   Blood Culture (routine x 2)     Status: None (Preliminary result)   Collection Time:  12/03/2020  9:54 PM   Specimen: BLOOD  Result Value Ref Range Status   Specimen Description   Final    BLOOD LEFT ANTECUBITAL Performed at [facility], [address], [place], [place] [address]    Special Requests   Final    BOTTLES DRAWN AEROBIC AND ANAEROBIC Blood Culture adequate volume Performed at [facility], [address], [place], [place] [address]    Culture   Final    NO GROWTH 3 DAYS Performed at [facility], [address] [place], [place] [address]    Report Status PENDING  Incomplete  Blood Culture (routine x 2)     Status: None (Preliminary result)   Collection Time: 12/21/2020  9:54 PM   Specimen: BLOOD  Result Value Ref Range Status   Specimen Description   Final    BLOOD LEFT HAND Performed at [facility], [address] [place], [place] [address]    Special Requests   Final    BOTTLES DRAWN AEROBIC AND ANAEROBIC Blood Culture adequate volume Performed at [facility], [address], [place], [place] [address]    Culture   Final    NO GROWTH 3 DAYS Performed at [facility], [address] [place], [place] [address]    Report Status PENDING  Incomplete      Radiology Studies: No results found.    Scheduled Meds: . baricitinib  4 mg Oral Daily  . enoxaparin (LOVENOX) injection  40 mg Subcutaneous Q24H  . methylPREDNISolone (SOLU-MEDROL) injection  40 mg Intravenous Q12H   Continuous Infusions: . azithromycin 500 mg (12/30/20 2306)  . cefTRIAXone (ROCEPHIN)  IV Stopped (12/30/20 2218)     LOS: 5 days      Time spent: 30 minutes   Noralee Stain, DO [facility] 12/31/2020, 1:00 PM   Available via Epic secure chat 7am-7pm After these hours, please refer to coverage provider listed on [email or web address]

## 2020-12-31 NOTE — Plan of Care (Signed)
  Problem: Respiratory: Goal: Will maintain a patent airway Outcome: Progressing Goal: Complications related to the disease process, condition or treatment will be avoided or minimized Outcome: Progressing   Problem: Activity: Goal: Risk for activity intolerance will decrease Outcome: Progressing   Problem: Pain Managment: Goal: General experience of comfort will improve Outcome: Progressing   Problem: Safety: Goal: Ability to remain free from injury will improve Outcome: Progressing   Problem: Skin Integrity: Goal: Risk for impaired skin integrity will decrease Outcome: Progressing

## 2020-12-31 NOTE — Progress Notes (Signed)
Appears to be resting without distress noted. Xanax and Tramadol given. Will cont to assess.

## 2020-12-31 NOTE — Progress Notes (Signed)
SLP Cancellation Note  Patient Details Name: Ann Coleman MRN: [id number] DOB: 1924/01/03   Cancelled treatment:       Reason Eval/Treat Not Completed: Medical issues which prohibited therapy. Per chart review, patient has been refusing PO's and oxygen saturations dropped due to patient continuously taking nasal cannula and NRB mask off, with saturations dropping to 60%'s and returning to 80%'s when mask and [place] placed back on. Will attempt to follow next date.   Angela Nevin, MA, CCC-SLP Speech Therapy

## 2020-12-31 NOTE — Progress Notes (Signed)
I used the stratus video to communicate the plan of care to the patient and talked to  The interpreter Srey with # [phone]. Patient didn't answer any of the questions. Will try again and continue to monitor.

## 2020-12-31 NOTE — TOC Progression Note (Addendum)
Transition of Care [facility]) - Progression Note    Patient Details  Name: Etter Goodson MRN: [id number] Date of Birth: Aug 19, 1924  Transition of Care [facility]) CM/SW Contact  Geni Bers, RN Phone Number: 12/31/2020, 3:06 PM  Clinical Narrative:    Spoke with [name] concerning Home Hospice. [name] selected [facility].  Referral called to [facility] in house rep Kaylyn Layer.    Expected Discharge Plan: Home/Self Care    Expected Discharge Plan and Services Expected Discharge Plan: Home/Self Care                                               Social Determinants of Health (SDOH) Interventions    Readmission Risk Interventions No flowsheet data found.

## 2020-12-31 NOTE — Care Management Important Message (Signed)
Important Message  Patient Details Sagan Letter placed in Patient's door Caddy. Name: Ann Coleman MRN: [id number] Date of Birth: 1924-02-22   Medicare Important Message Given:  Yes     Caren Macadam 12/31/2020, 11:56 AM

## 2021-01-01 DIAGNOSIS — Z515 Encounter for palliative care: Secondary | ICD-10-CM | POA: Diagnosis not present

## 2021-01-01 DIAGNOSIS — U071 COVID-19: Secondary | ICD-10-CM | POA: Diagnosis not present

## 2021-01-01 DIAGNOSIS — J1282 Pneumonia due to coronavirus disease 2019: Secondary | ICD-10-CM | POA: Diagnosis not present

## 2021-01-01 DIAGNOSIS — Z7189 Other specified counseling: Secondary | ICD-10-CM | POA: Diagnosis not present

## 2021-01-01 LAB — CBC WITH DIFFERENTIAL/PLATELET
Abs Immature Granulocytes: 0.12 10*3/uL — ABNORMAL HIGH (ref 0.00–0.07)
Basophils Absolute: 0 10*3/uL (ref 0.0–0.1)
Basophils Relative: 0 %
Eosinophils Absolute: 0 10*3/uL (ref 0.0–0.5)
Eosinophils Relative: 0 %
HCT: 36.2 % (ref 36.0–46.0)
Hemoglobin: 12 g/dL (ref 12.0–15.0)
Immature Granulocytes: 1 %
Lymphocytes Relative: 3 %
Lymphs Abs: 0.5 10*3/uL — ABNORMAL LOW (ref 0.7–4.0)
MCH: 26.3 pg (ref 26.0–34.0)
MCHC: 33.1 g/dL (ref 30.0–36.0)
MCV: 79.4 fL — ABNORMAL LOW (ref 80.0–100.0)
Monocytes Absolute: 0.8 10*3/uL (ref 0.1–1.0)
Monocytes Relative: 5 %
Neutro Abs: 15.2 10*3/uL — ABNORMAL HIGH (ref 1.7–7.7)
Neutrophils Relative %: 91 %
Platelets: 324 10*3/uL (ref 150–400)
RBC: 4.56 MIL/uL (ref 3.87–5.11)
RDW: 14 % (ref 11.5–15.5)
WBC: 16.7 10*3/uL — ABNORMAL HIGH (ref 4.0–10.5)
nRBC: 0 % (ref 0.0–0.2)

## 2021-01-01 LAB — COMPREHENSIVE METABOLIC PANEL
ALT: 28 U/L (ref 0–44)
AST: 51 U/L — ABNORMAL HIGH (ref 15–41)
Albumin: 2.2 g/dL — ABNORMAL LOW (ref 3.5–5.0)
Alkaline Phosphatase: 57 U/L (ref 38–126)
Anion gap: 9 (ref 5–15)
BUN: 42 mg/dL — ABNORMAL HIGH (ref 8–23)
CO2: 25 mmol/L (ref 22–32)
Calcium: 7.6 mg/dL — ABNORMAL LOW (ref 8.9–10.3)
Chloride: 107 mmol/L (ref 98–111)
Creatinine, Ser: 0.88 mg/dL (ref 0.44–1.00)
GFR, Estimated: >60 mL/min (ref >60)
Glucose, Bld: 134 mg/dL — ABNORMAL HIGH (ref 70–99)
Potassium: 3.5 mmol/L (ref 3.5–5.1)
Sodium: 141 mmol/L (ref 135–145)
Total Bilirubin: 0.8 mg/dL (ref 0.3–1.2)
Total Protein: 5.5 g/dL — ABNORMAL LOW (ref 6.5–8.1)

## 2021-01-01 LAB — CULTURE, BLOOD (ROUTINE X 2)
Culture: NO GROWTH
Culture: NO GROWTH
Special Requests: ADEQUATE
Special Requests: ADEQUATE

## 2021-01-01 LAB — D-DIMER, QUANTITATIVE: D-Dimer, Quant: 3.39 ug/mL-FEU — ABNORMAL HIGH (ref 0.00–0.50)

## 2021-01-01 LAB — C-REACTIVE PROTEIN: CRP: 9.4 mg/dL — ABNORMAL HIGH (ref <1.0)

## 2021-01-01 MED ORDER — CHLORHEXIDINE GLUCONATE CLOTH 2 % EX PADS
6.0000 | MEDICATED_PAD | Freq: Every day | CUTANEOUS | Status: DC
  Administered 2021-01-01 – 2021-01-07 (×7): 6 via TOPICAL

## 2021-01-01 NOTE — Progress Notes (Signed)
Added sterile water to hfnc 02 system - uneventful. 

## 2021-01-01 NOTE — Progress Notes (Signed)
SLP Cancellation Note  Patient Details Name: Ann Coleman MRN: [id number] DOB: 1924-10-15   Cancelled treatment:       Reason Eval/Treat Not Completed: Other (comment) (pt to dc home with hospice per notes, will sign off)   Glorianne, Proctor 01/01/2021, 2:47 PM  Rolena Infante, MS [facility] SLP Acute Rehab Services Office [phone] Pager [phone]

## 2021-01-01 NOTE — Progress Notes (Signed)
PT Cancellation Note  Patient Details Name: Ann Coleman MRN: [id number] DOB: 05-28-1924   Cancelled Treatment:      Pt going home with hospice. PT signing off   [facility] 01/01/2021, 9:49 AM

## 2021-01-01 NOTE — Progress Notes (Signed)
[facility])  Chart and pt information have been reviewed by [facility] physician.  Hospice eligibility confirmed.  DME not yet ordered, as pt is on 30L total O2 supplementation.  Max for home hospice is 15L/min.  ACC will continue to follow; DME will be ordered when pt is closer to this range.  Thank you for the opportunity to participate in this pt's care.  Gillian Scarce, BSN, RN [employer] [phone] (24h on call)

## 2021-01-01 NOTE — Consult Note (Signed)
Consultation Note Date: 01/01/2021   Patient Name: Ann Coleman  DOB: 1923/12/23  MRN: [id number]  Age / Sex: 87 y.o., female  PCP: Patient, No Pcp Per Referring Physician: Noralee Stain, DO  Reason for Consultation: Establishing goals of care  HPI/Patient Profile: 87 y.o. female   admitted on 28-Dec-2020    Clinical Assessment and Goals of Care: 87 year old Cambodian speaking lady past medical history of stroke hypertension diastolic dysfunction dyslipidemia chronic bilateral lower extremity pain.  Patient had Covid exposure at home with family members positive for Covid, presented with significant weakness shortness of breath found to be hypoxic at the time of initial evaluation found to have bilateral pneumonia Covid test result was positive and patient continued to decompensate, requiring 5 L high flow oxygen as well as non rebreather.  Patient remains admitted to hospital medicine service underwent appropriate treatment for her Covid pneumonia according to current guidelines.  Patient had ongoing decline in her functional status as well as in her respiratory status.  It was determined that the next best step would be for the patient to transition home with hospice services and this was being arranged.  However the patient continues to have high oxygen requirements and for additional goals of care discussions with the patient's family a palliative consultation has been requested.  Patient appears with generalized weakness and frailty.  It is her 89th birthday today, she was born on [birth year].  Discussed with hospital medicine Dr. Alvino Chapel.  Call placed and discussed with daughter after chart was reviewed as well.  Introduced myself and palliative care as follows:  Palliative medicine is specialized medical care for people living with serious illness. It focuses on providing relief from the symptoms and stress of a serious  illness. The goal is to improve quality of life for both the patient and the family.  Goals of care: Broad aims of medical therapy in relation to the patient's values and preferences. Our aim is to provide medical care aimed at enabling patients to achieve the goals that matter most to them, given the circumstances of their particular medical situation and their constraints.   I reviewed with the patient's daughter on the phone about scope of current hospitalization and patient's current condition.  Discussed frankly but compassionately that the patient has high oxygen requirements, ongoing decline and possibly with markedly limited prognosis of as short as few hours to as long as few days.  Discussed about scope of comfort measures, discussed about possible anticipated hospital death.  Patient's daughter asks for continuation of current medical therapies including supplemental oxygen and current treatments as the patient's other children are trying to come visit the patient here in the hospital.  After family visits, possibly 01-02-21, she is willing to consider the possibility of full scope of comfort measures.  I did explain to her in detail about comfort care: Judicious use of opioids and benzodiazepines for aggressive symptom management, discontinuation of medications and therapy is no longer directly contributed to comfort.  Discussed with that and comfort mode of  care, there could be de-escalation of supplemental oxygen therapies and up titration of opioids for symptom management.  She does not wish to start that today 01-01-21.  Additionally, her other question is about whether the patient would qualify for funeral assistance because of her Covid.  Will request TOC to address her concerns.   NEXT OF KIN Patient has a daughter.  Patient has 4 kids.  SUMMARY OF RECOMMENDATIONS   Agree with DO NOT RESUSCITATE Continue current mode of care Overall goals of care conversations are moving in the  direction of establishing comfort as a singular goal.  This may happen on 01-02-21.  For today, the patient's daughter is hoping that the patient's son will be able to come visit and for continuing current mode of care. Thank you for the consult.  Code Status/Advance Care Planning:  DNR    Symptom Management:    as above.   Palliative Prophylaxis:   Delirium Protocol  Additional Recommendations (Limitations, Scope, Preferences):  Overall goals of care are moving towards full comfort measures, daughter states that patient's son is going to visit today, she doesn't want to start comfort care until the patient's son has a chance to see patient.   Psycho-social/Spiritual:   Desire for further Chaplaincy support:yes  Additional Recommendations: Education on Hospice  Prognosis:   Hours - Days  Discharge Planning: Anticipated Hospital Death      Primary Diagnoses: Present on Admission: . CKD (chronic kidney disease), stage II . HLD (hyperlipidemia) . Essential hypertension . (Resolved) CAP (community acquired pneumonia)   I have reviewed the medical record, interviewed the patient and family, and examined the patient. The following aspects are pertinent.  Past Medical History:  Diagnosis Date  . High cholesterol   . Hypertension   . Stroke [facility])    Social History   Socioeconomic History  . Marital status: Divorced    Spouse name: Not on file  . Number of children: Not on file  . Years of education: Not on file  . Highest education level: Not on file  Occupational History  . Not on file  Tobacco Use  . Smoking status: Never Smoker  . Smokeless tobacco: Never Used  Vaping Use  . Vaping Use: Never used  Substance and Sexual Activity  . Alcohol use: No  . Drug use: No  . Sexual activity: Never  Other Topics Concern  . Not on file  Social History Narrative   Lives w/ daughter   Right-handed   No caffeine   Social Determinants of Health   Financial  Resource Strain: Not on file  Food Insecurity: Not on file  Transportation Needs: Not on file  Physical Activity: Not on file  Stress: Not on file  Social Connections: Not on file   Family History  Problem Relation Age of Onset  . Stroke Neg Hx   . Diabetes Mellitus II Neg Hx   . Hypertension Neg Hx    Scheduled Meds: . baricitinib  4 mg Oral Daily  . Chlorhexidine Gluconate Cloth  6 each Topical Daily  . enoxaparin (LOVENOX) injection  40 mg Subcutaneous Q24H  . methylPREDNISolone (SOLU-MEDROL) injection  40 mg Intravenous Q12H   Continuous Infusions: PRN Meds:.acetaminophen, albuterol, ALPRAZolam, ondansetron (ZOFRAN) IV, traMADol Medications Prior to Admission:  Prior to Admission medications   Not on File   No Known Allergies Review of Systems Aims to remove oxygen mask  Physical Exam Appears weak with frailty and generalized deconditioning.  diminished work of breathing.  Regular Abdomen not distended Attempts to remove her O2 mask frequently.   Vital Signs: BP (!) 149/95 (BP Location: Left Arm)   Pulse (!) 104   Temp 97.8 F (36.6 C)   Resp 19   Ht 4\' 11"  (1.499 m)   Wt 69 kg   SpO2 90%   BMI 30.72 kg/m  Pain Scale: Faces   Pain Score: Asleep   SpO2: SpO2: 90 % O2 Device:SpO2: 90 % O2 Flow Rate: .O2 Flow Rate (L/min): 15 L/min (plus 15 nrb)  IO: Intake/output summary:   Intake/Output Summary (Last 24 hours) at 01/01/2021 1618 Last data filed at 01/01/2021 1100 Gross per 24 hour  Intake --  Output 750 ml  Net -750 ml    LBM:   Baseline Weight: Weight: 69 kg Most recent weight: Weight: 69 kg     Palliative Assessment/Data:   PPS 30%  Time In:  1500 Time Out:  1600 Time Total:  60  Greater than 50%  of this time was spent counseling and coordinating care related to the above assessment and plan.  Signed by: 03/01/2021, MD   Please contact Palliative Medicine Team phone at [phone] for questions and concerns.  For individual provider:  See [phone]

## 2021-01-01 NOTE — Progress Notes (Signed)
PROGRESS NOTE    Ann Coleman  LYY:[id number] DOB: Jun 22, 1924 DOA: 12/04/2020 PCP: Patient, No Pcp Per     Brief Narrative:  Ann Coleman is a Cambodian-speaking 87 y.o. female with medical history significant of multiple episode of CVA, essential hypertension, diastolic dysfunction, hyperlipidemia, chronic bilateral lower extremity pain who is known to have Covid exposure at home with family members positive with Covid. Patient was having significant weakness or shortness of breath. When EMS arrived she was found to be hypoxic. Patient brought to the ER where she is evaluated. She appears to have bilateral pneumonia.  She initially required 2 L of oxygen in the emergency department.  Covid test resulted positive. Patient continued to decompensate requiring 15 L high flow oxygen as well as nonrebreather. Due to language barrier and hearing loss, patient has continued to take her oxygen mask off.  New events last 24 hours / Subjective: Patient hard of hearing, does not participate with conversation via interpreter today.  She has continued to take her oxygen off, on my arrival she is satting in the mid 59s with high flow O2 on but nonrebreather off.  Unfortunately in speaking with case management, home hospice and [facility] cannot provide patient with more than 15 L oxygen at home.  Today is her 89th birthday.  Assessment & Plan:   Principal Problem:   Pneumonia due to COVID-19 virus Active Problems:   HLD (hyperlipidemia)   Essential hypertension   CKD (chronic kidney disease), stage II   Diastolic dysfunction   Acute hypoxemic respiratory failure secondary to COVID-19  -In the emergency department, SPO2 70% on room air and required nonrebreather at 15 L, and now on NRB+HFNC.  She may need to upgrade to heated high flow oxygen -CTA negative for PE  COVID-19 Pneumonia -Tested positive on 1/26 -Completed 5 days of Remdesivir -Continue steroids 1/26 >>  -Continue baricitinib 1/27 >> -Also continue  Rocephin and azithromycin due to elevated procalcitonin, initial concern for bacterial infection -Supportive treatments as ordered, encourage IS, encourage prone positioning, encourage mobilization  -Due to elevated D-dimer, DVT ultrasound negative to DVT   COVID-19 Labs  Recent Labs    12/30/20 0515 12/31/20 0436 01/01/21 0443  DDIMER 2.97* 3.74* 3.39*  FERRITIN 682* 756*  --   CRP 6.3* 11.6* 9.4*    Lab Results  Component Value Date   SARSCOV2NAA POSITIVE (A) 12/30/2020   SARSCOV2NAA NEGATIVE 05/29/2020    CKD stage IIIa -Continue to monitor BMP  History of CVA  -Residual dysphagia  Left thyroid nodule -Recommend thyroid ultrasound for work-up as an outpatient  Goals of care -Despite aggressive treatment for COVID-19, patient continues to decompensate, requiring high levels of oxygen, inflammatory markers increasing. Palliative care medicine was consulted.  Daughter was initially interested in home hospice, however they are unable to provide level of oxygen needs that patient requires.  On discussion today, patient's daughter is not 100% on board with comfort care but realizes that patient continues to decline.  I do think that patient is a residential hospice candidate, without her level of oxygen, likely has days to live.    DVT prophylaxis:  enoxaparin (LOVENOX) injection 40 mg Start: 12/27/20 1000  Code Status: DNR, confirmed with 2 daughters over the phone after admission Family Communication: Discussed with daughter over the phone this morning Disposition Plan:  Status is: Inpatient  Remains inpatient appropriate because:Hemodynamically unstable   Dispo: The patient is from: Home  Anticipated d/c is to: To be determined              Anticipated d/c date is: > 3 days              Patient currently is not medically stable to d/c.   Difficult to place patient No   Antimicrobials:  Anti-infectives (From admission, onward)   Start     Dose/Rate  Route Frequency Ordered Stop   12/28/20 1000  remdesivir 100 mg in sodium chloride 0.9 % 100 mL IVPB       "Followed by" Linked Group Details   100 mg 200 mL/hr over 30 Minutes Intravenous Every 24 hours 12/27/20 1120 12/31/20 1243   12/27/20 2200  azithromycin (ZITHROMAX) 500 mg in sodium chloride 0.9 % 250 mL IVPB        500 mg 250 mL/hr over 60 Minutes Intravenous Every 24 hours 12/27/2020 2338 01/01/21 0846   12/27/20 2200  cefTRIAXone (ROCEPHIN) 2 g in sodium chloride 0.9 % 100 mL IVPB        2 g 200 mL/hr over 30 Minutes Intravenous Every 24 hours 2020-12-27 2338 01/01/21 0846   12/27/20 1200  remdesivir 200 mg in sodium chloride 0.9% 250 mL IVPB       "Followed by" Linked Group Details   200 mg 580 mL/hr over 30 Minutes Intravenous Once 12/27/20 1120 12/27/20 1335   12-27-20 2330  cefTRIAXone (ROCEPHIN) 2 g in sodium chloride 0.9 % 100 mL IVPB  Status:  Discontinued        2 g 200 mL/hr over 30 Minutes Intravenous Every 24 hours 12/27/20 2326 12/27/20 2337   Dec 27, 2020 2330  azithromycin (ZITHROMAX) 500 mg in sodium chloride 0.9 % 250 mL IVPB  Status:  Discontinued        500 mg 250 mL/hr over 60 Minutes Intravenous Every 24 hours 2020-12-27 2326 12-27-20 2336   12/27/2020 2300  cefTRIAXone (ROCEPHIN) 1 g in sodium chloride 0.9 % 100 mL IVPB        1 g 200 mL/hr over 30 Minutes Intravenous  Once 27-Dec-2020 2254 12/27/20 0036   December 27, 2020 2300  azithromycin (ZITHROMAX) 500 mg in sodium chloride 0.9 % 250 mL IVPB        500 mg 250 mL/hr over 60 Minutes Intravenous  Once December 27, 2020 2254 12/27/20 0036       Objective: Vitals:   01/01/21 0235 01/01/21 0400 01/01/21 0600 01/01/21 0800  BP:   (!) 163/104   Pulse: (!) 105 95 (!) 105 (!) 108  Resp: (!) 24 (!) 24  18  Temp:      TempSrc:      SpO2: 90% 91% 90% 92%  Weight:      Height:        Intake/Output Summary (Last 24 hours) at 01/01/2021 1153 Last data filed at 01/01/2021 0352 Gross per 24 hour  Intake --  Output 525 ml  Net -525 ml    Filed Weights   12/27/20 1159  Weight: 69 kg    Examination: General exam: Appears calm, restless Respiratory system: Diminished breath sounds.  ncreased respiratory rate Cardiovascular system: S1 & S2 heard, tachycardic, regular. No pedal edema. Gastrointestinal system: Abdomen is nondistended, soft and nontender. Normal bowel sounds heard. Central nervous system: Alert  Extremities: Symmetric in appearance bilaterally  Skin: No rashes, lesions or ulcers on exposed skin    Data Reviewed: I have personally reviewed following labs and imaging studies  CBC: Recent Labs  Lab 12/28/20  1118 12/29/20 0523 12/30/20 0515 12/31/20 0436 01/01/21 0443  WBC 7.7 10.5 15.2* 17.8* 16.7*  NEUTROABS 6.0 8.9* 13.7* 16.5* 15.2*  HGB 12.7 13.0 13.2 13.2 12.0  HCT 39.2 39.4 40.1 39.9 36.2  MCV 80.0 79.6* 79.7* 78.1* 79.4*  PLT 287 321 347 332 324   Basic Metabolic Panel: Recent Labs  Lab 12/28/20 1118 12/29/20 0523 12/30/20 0515 12/31/20 0436 01/01/21 0443  NA 134* 135 134* 136 141  K 3.7 3.6 3.6 3.6 3.5  CL 99 99 100 102 107  CO2 24 24 23 23 25   GLUCOSE 146* 139* 113* 116* 134*  BUN 22 26* 24* 30* 42*  CREATININE 0.87 0.86 0.83 0.81 0.88  CALCIUM 8.1* 8.0* 7.9* 7.7* 7.6*   GFR: Estimated Creatinine Clearance: 32.3 mL/min (by C-G formula based on SCr of 0.88 mg/dL). Liver Function Tests: Recent Labs  Lab 12/28/20 1118 12/29/20 0523 12/30/20 0515 12/31/20 0436 01/01/21 0443  AST 43* 39 39 51* 51*  ALT 22 21 20 26 28   ALKPHOS 51 45 44 50 57  BILITOT 0.7 0.7 0.7 0.8 0.8  PROT 7.3 6.7 6.4* 6.4* 5.5*  ALBUMIN 2.8* 2.7* 2.5* 2.5* 2.2*   No results for input(s): LIPASE, AMYLASE in the last 168 hours. No results for input(s): AMMONIA in the last 168 hours. Coagulation Profile: No results for input(s): INR, PROTIME in the last 168 hours. Cardiac Enzymes: No results for input(s): CKTOTAL, CKMB, CKMBINDEX, TROPONINI in the last 168 hours. BNP (last 3 results) No results  for input(s): PROBNP in the last 8760 hours. HbA1C: No results for input(s): HGBA1C in the last 72 hours. CBG: No results for input(s): GLUCAP in the last 168 hours. Lipid Profile: No results for input(s): CHOL, HDL, LDLCALC, TRIG, CHOLHDL, LDLDIRECT in the last 72 hours. Thyroid Function Tests: No results for input(s): TSH, T4TOTAL, FREET4, T3FREE, THYROIDAB in the last 72 hours. Anemia Panel: Recent Labs    12/30/20 0515 12/31/20 0436  FERRITIN 682* 756*   Sepsis Labs: Recent Labs  Lab 12/06/2020 2154 12/27/20 0039 12/28/20 1118 12/29/20 0523  PROCALCITON 0.38  --  0.20 0.11  LATICACIDVEN 1.3 1.0  --   --     Recent Results (from the past 240 hour(s))  SARS Coronavirus 2 by RT PCR (hospital order, performed in [facility] hospital lab) Nasopharyngeal Nasopharyngeal Swab     Status: Abnormal   Collection Time: 12/05/2020  9:54 PM   Specimen: Nasopharyngeal Swab  Result Value Ref Range Status   SARS Coronavirus 2 POSITIVE (A) NEGATIVE Final    Comment: CRITICAL RESULT CALLED TO, READ BACK BY AND VERIFIED WITH: NIELSON J. 1.26.22 @ 1133 BY MECIAL J. (NOTE) SARS-CoV-2 target nucleic acids are DETECTED  SARS-CoV-2 RNA is generally detectable in upper respiratory specimens  during the acute phase of infection.  Positive results are indicative  of the presence of the identified virus, but do not rule out bacterial infection or co-infection with other pathogens not detected by the test.  Clinical correlation with patient history and  other diagnostic information is necessary to determine patient infection status.  The expected result is negative.  Fact Sheet for Patients:   12/28/20   Fact Sheet for Healthcare Providers:   01-19-1972    This test is not yet approved or cleared by the [employer] FDA and  has been authorized for detection and/or diagnosis of SARS-CoV-2 by FDA under an Emergency Use  Authorization (EUA).  This EUA will remain in effect (me aning this test  can be used) for the duration of  the COVID-19 declaration under Section 564(b)(1) of the Act, 21 U.S.C. section 360-bbb-3(b)(1), unless the authorization is terminated or revoked sooner.  Performed at [facility], [address], [place], [place] [address]   Blood Culture (routine x 2)     Status: None (Preliminary result)   Collection Time: 12/06/2020  9:54 PM   Specimen: BLOOD  Result Value Ref Range Status   Specimen Description   Final    BLOOD LEFT ANTECUBITAL Performed at [facility], [address], [place], [place] [address]    Special Requests   Final    BOTTLES DRAWN AEROBIC AND ANAEROBIC Blood Culture adequate volume Performed at [facility], [address], [place], [place] [address]    Culture   Final    NO GROWTH 4 DAYS Performed at [facility], [address] [place], [place] [address]    Report Status PENDING  Incomplete  Blood Culture (routine x 2)     Status: None (Preliminary result)   Collection Time: 12/20/2020  9:54 PM   Specimen: BLOOD  Result Value Ref Range Status   Specimen Description   Final    BLOOD LEFT HAND Performed at [facility], [address] [place], [place] [address]    Special Requests   Final    BOTTLES DRAWN AEROBIC AND ANAEROBIC Blood Culture adequate volume Performed at [facility], [address] [place], [place] [address]    Culture   Final    NO GROWTH 4 DAYS Performed at [facility], [address] [place], [place] [address]    Report Status PENDING  Incomplete      Radiology Studies: No results found.    Scheduled Meds: . baricitinib  4 mg Oral Daily  . Chlorhexidine Gluconate Cloth  6 each Topical Daily  . enoxaparin (LOVENOX) injection  40 mg Subcutaneous Q24H  . methylPREDNISolone (SOLU-MEDROL) injection  40 mg Intravenous Q12H    Continuous Infusions:    LOS: 6 days      Time spent: 35 minutes   Noralee Stain, DO [facility] 01/01/2021, 11:53 AM   Available via Epic secure chat 7am-7pm After these hours, please refer to coverage provider listed on [email or web address]

## 2021-01-01 DEATH — deceased

## 2021-01-02 DIAGNOSIS — J1282 Pneumonia due to coronavirus disease 2019: Secondary | ICD-10-CM | POA: Diagnosis not present

## 2021-01-02 DIAGNOSIS — R531 Weakness: Secondary | ICD-10-CM | POA: Diagnosis not present

## 2021-01-02 DIAGNOSIS — Z515 Encounter for palliative care: Secondary | ICD-10-CM | POA: Diagnosis not present

## 2021-01-02 DIAGNOSIS — Z7189 Other specified counseling: Secondary | ICD-10-CM | POA: Diagnosis not present

## 2021-01-02 DIAGNOSIS — U071 COVID-19: Secondary | ICD-10-CM | POA: Diagnosis not present

## 2021-01-02 LAB — CBC WITH DIFFERENTIAL/PLATELET
Abs Immature Granulocytes: 0.16 10*3/uL — ABNORMAL HIGH (ref 0.00–0.07)
Basophils Absolute: 0 10*3/uL (ref 0.0–0.1)
Basophils Relative: 0 %
Eosinophils Absolute: 0 10*3/uL (ref 0.0–0.5)
Eosinophils Relative: 0 %
HCT: 35.9 % — ABNORMAL LOW (ref 36.0–46.0)
Hemoglobin: 11.8 g/dL — ABNORMAL LOW (ref 12.0–15.0)
Immature Granulocytes: 1 %
Lymphocytes Relative: 2 %
Lymphs Abs: 0.4 10*3/uL — ABNORMAL LOW (ref 0.7–4.0)
MCH: 26.2 pg (ref 26.0–34.0)
MCHC: 32.9 g/dL (ref 30.0–36.0)
MCV: 79.6 fL — ABNORMAL LOW (ref 80.0–100.0)
Monocytes Absolute: 1 10*3/uL (ref 0.1–1.0)
Monocytes Relative: 6 %
Neutro Abs: 15.6 10*3/uL — ABNORMAL HIGH (ref 1.7–7.7)
Neutrophils Relative %: 91 %
Platelets: 371 10*3/uL (ref 150–400)
RBC: 4.51 MIL/uL (ref 3.87–5.11)
RDW: 14.3 % (ref 11.5–15.5)
WBC: 17.2 10*3/uL — ABNORMAL HIGH (ref 4.0–10.5)
nRBC: 0 % (ref 0.0–0.2)

## 2021-01-02 LAB — COMPREHENSIVE METABOLIC PANEL
ALT: 37 U/L (ref 0–44)
AST: 71 U/L — ABNORMAL HIGH (ref 15–41)
Albumin: 2.4 g/dL — ABNORMAL LOW (ref 3.5–5.0)
Alkaline Phosphatase: 84 U/L (ref 38–126)
Anion gap: 9 (ref 5–15)
BUN: 46 mg/dL — ABNORMAL HIGH (ref 8–23)
CO2: 26 mmol/L (ref 22–32)
Calcium: 7.7 mg/dL — ABNORMAL LOW (ref 8.9–10.3)
Chloride: 105 mmol/L (ref 98–111)
Creatinine, Ser: 0.84 mg/dL (ref 0.44–1.00)
GFR, Estimated: >60 mL/min (ref >60)
Glucose, Bld: 128 mg/dL — ABNORMAL HIGH (ref 70–99)
Potassium: 3.5 mmol/L (ref 3.5–5.1)
Sodium: 140 mmol/L (ref 135–145)
Total Bilirubin: 1 mg/dL (ref 0.3–1.2)
Total Protein: 6.1 g/dL — ABNORMAL LOW (ref 6.5–8.1)

## 2021-01-02 LAB — D-DIMER, QUANTITATIVE: D-Dimer, Quant: 3.59 ug/mL-FEU — ABNORMAL HIGH (ref 0.00–0.50)

## 2021-01-02 LAB — C-REACTIVE PROTEIN: CRP: 6.3 mg/dL — ABNORMAL HIGH (ref <1.0)

## 2021-01-02 MED ORDER — MORPHINE SULFATE (PF) 2 MG/ML IV SOLN
1.0000 mg | INTRAVENOUS | Status: DC | PRN
  Administered 2021-01-03 (×4): 1 mg via INTRAVENOUS
  Filled 2021-01-02 (×4): qty 1

## 2021-01-02 NOTE — Progress Notes (Signed)
Daily Progress Note   Patient Name: Ann Coleman       Date: 01/02/2021 DOB: [birth year]  Age: 87 y.o. MRN#: [id number] Attending Physician: Darlin Drop, DO Primary Care Physician: Patient, No Pcp Per Admit Date: 2021/01/20  Reason for Consultation/Follow-up: Terminal Care  Subjective:  remains with high O2 requirements, family has arrived at bedside to visit. Discussed with bedside RN and TRH MD.   Length of Stay: 7  Current Medications: Scheduled Meds:  . baricitinib  4 mg Oral Daily  . Chlorhexidine Gluconate Cloth  6 each Topical Daily  . enoxaparin (LOVENOX) injection  40 mg Subcutaneous Q24H  . methylPREDNISolone (SOLU-MEDROL) injection  40 mg Intravenous Q12H    Continuous Infusions:   PRN Meds: acetaminophen, albuterol, ALPRAZolam, morphine injection, ondansetron (ZOFRAN) IV, traMADol  Physical Exam         Not noted to be in distress, family in the room.   Thorough chart review and discussion with necessary members of the care team was completed as part of assessment. All issues were discussed and addressed but no physical exam was performed.  family and TRH MD already in the room.   Vital Signs: BP (!) 178/102   Pulse 95   Temp 98.1 F (36.7 C)   Resp (!) 21   Ht 4\' 11"  (1.499 m)   Wt 69 kg   SpO2 95%   BMI 30.72 kg/m  SpO2: SpO2: 95 % O2 Device: O2 Device: High Flow Nasal Cannula,NRB O2 Flow Rate: O2 Flow Rate (L/min): 15 L/min  Intake/output summary:   Intake/Output Summary (Last 24 hours) at 01/02/2021 1158 Last data filed at 01/02/2021 03/02/2021 Gross per 24 hour  Intake --  Output 250 ml  Net -250 ml   LBM: Last BM Date:  (UNknown BM date) Baseline Weight: Weight: 69 kg Most recent weight: Weight: 69 kg       Palliative Assessment/Data:      Patient  Active Problem List   Diagnosis Date Noted  . Pneumonia due to COVID-19 virus 12/27/2020  . Morbid obesity (HCC)   . Prediabetes   . Dyspnea   . Diastolic dysfunction   . Wheezing   . Elevated blood pressure reading   . CKD (chronic kidney disease), stage II   . Chronic pain of both knees   .  Right hemiparesis (HCC)   . AKI (acute kidney injury) (HCC)   . Chest pressure   . Essential hypertension   . Labile blood pressure   . Dysphagia, post-stroke   . Brainstem infarct, acute (HCC) 06/02/2020  . Left pontine cerebrovascular accident (HCC) 06/02/2020  . Acute CVA (cerebrovascular accident) (HCC) 05/29/2020  . HLD (hyperlipidemia) 05/29/2020  . Lacunar infarction (HCC) 05/10/2017  . Stroke (HCC) 03/05/2017  . Acute right MCA stroke (HCC) 03/04/2017  . Hypertensive urgency 03/04/2017  . Stroke (cerebrum) (HCC) 03/04/2017    Palliative Care Assessment & Plan   Patient Profile:    Assessment:  87 year old Cambodian speaking lady past medical history of stroke hypertension diastolic dysfunction dyslipidemia chronic bilateral lower extremity pain.  Patient had Covid exposure at home with family members positive for Covid, presented with significant weakness shortness of breath found to be hypoxic at the time of initial evaluation found to have bilateral pneumonia Covid test result was positive and patient continued to decompensate, requiring 15 L high flow oxygen as well as non rebreather.  Patient remains admitted to hospital medicine service underwent appropriate treatment for her Covid pneumonia according to current guidelines.  Patient had ongoing decline in her functional status as well as in her respiratory status.  It was determined that the next best step would be for the patient to transition home with hospice services and this was being arranged.  However the patient continues to have high oxygen requirements and for additional goals of care discussions with the patient's  family a palliative consultation has been requested.   Recommendations/Plan:   Add low dose PRN IV Morphine for pain and or dyspnea.  Continue to support patient and family.   Anticipated hospital death.  Recommend comfort measures, family visiting today 2-2, anticipate full comfort care from 2-3.   Goals of Care and Additional Recommendations:  Limitations on Scope of Treatment: Minimize Medications  Code Status:    Code Status Orders  (From admission, onward)         Start     Ordered   12/27/20 1102  Do not attempt resuscitation (DNR)  Continuous       Question Answer Comment  In the event of cardiac or respiratory ARREST Do not call a "code blue"   In the event of cardiac or respiratory ARREST Do not perform Intubation, CPR, defibrillation or ACLS   In the event of cardiac or respiratory ARREST Use medication by any route, position, wound care, and other measures to relive pain and suffering. May use oxygen, suction and manual treatment of airway obstruction as needed for comfort.      12/27/20 1101        Code Status History    Date Active Date Inactive Code Status Order ID Comments User Context   December 31, 2020 2326 12/27/2020 1101 Full Code [id number]  Rometta Emery, MD ED   06/02/2020 1618 06/22/2020 1642 Full Code [id number]  Charlton Amor, PA-C Inpatient   06/02/2020 1618 06/02/2020 1618 DNR [id number]  Charlton Amor, PA-C Inpatient   05/29/2020 2353 06/02/2020 1611 DNR [id number]  John Giovanni, MD ED   03/04/2017 2352 03/06/2017 1905 Full Code [id number]  Eduard Clos, MD Inpatient   Advance Care Planning Activity       Prognosis:   Hours - Days  Discharge Planning:  Anticipated Hospital Death  Care plan was discussed with  IDT.   Thank you for allowing the Palliative Medicine Team to assist in the care  of this patient.   Time In: 10 Time Out: 10.25 Total Time 25 Prolonged Time Billed  no       Greater than 50%  of this time was spent  counseling and coordinating care related to the above assessment and plan.  [name], MD  Please contact Palliative Medicine Team phone at [phone] for questions and concerns.

## 2021-01-02 NOTE — Progress Notes (Signed)
PROGRESS NOTE  Ann Coleman DTO:[id number] DOB: 1924/06/09 DOA: 2020/12/29 PCP: Patient, No Pcp Per  HPI/Recap of past 24 hours: Lasharon Syler is a Cambodian-speaking 87 y.o.femalewith medical history significant ofmultiple episode of CVA, essential hypertension, diastolic dysfunction, hyperlipidemia, chronic bilateral lower extremity pain who is known to have Covid exposure at home with family members positive with Covid. Patient was having significant weakness or shortness of breath. When EMS arrived she was found to be hypoxic. Patient brought to the ER where she is evaluated. She appears to have bilateral pneumonia.  She initially required 2 L of oxygen in the emergency department.  Covid test resulted positive. Patient continued to decompensate requiring 15 L high flow oxygen as well as nonrebreather. Due to language barrier and hearing loss, patient has continued to take her oxygen mask off.  01/02/21: Seen and examined at bedside. She is on 14 L high flow nasal cannula and on nonrebreather. Her son and daughter in law in the room. Updated her daughter via phone. Palliative care team following, appreciate Dr. Beatriz Stallion assistance.  Assessment/Plan: Principal Problem:   Pneumonia due to COVID-19 virus Active Problems:   HLD (hyperlipidemia)   Essential hypertension   CKD (chronic kidney disease), stage II   Diastolic dysfunction   Acute hypoxemic respiratory failure secondary to COVID-19 viral pneumonia, POA -In the emergency department, SPO2 70% on room air and required nonrebreather at 15 L, and now on NRB+HFNC.   -CTA negative for PE -Maintain O2 saturation greater than 90%.  COVID-19 viral pneumonia, POA -Tested positive on 1/26 -Completed 5 days of Remdesivir -Continue steroids 1/26 >>  -Continue baricitinib 1/27 >> -Also completed Rocephin and azithromycin due to elevated procalcitonin, initial concern for bacterial infection -Supportive treatments as ordered, encourage IS, encourage  prone positioning, encourage mobilization  -Due to elevated D-dimer, DVT ultrasound negative to DVT   COVID-19 Labs  Recent Labs (last 2 labs)        Recent Labs    12/30/20 0515 12/31/20 0436 01/01/21 0443  DDIMER 2.97* 3.74* 3.39*  FERRITIN 682* 756*  --   CRP 6.3* 11.6* 9.4*      Recent Labs       Lab Results  Component Value Date   SARSCOV2NAA POSITIVE (A) Dec 29, 2020   SARSCOV2NAA NEGATIVE 05/29/2020      CKD stage IIIa -Continue to monitor BMP  History of CVA  -Residual dysphagia  Left thyroid nodule -Recommend thyroid ultrasound for work-up as an outpatient  Goals of care -Despite aggressive treatment for COVID-19, patient continues to decompensate, requiring high levels of oxygen, inflammatory markers increasing. Palliative care medicine was consulted.  Daughter was initially interested in home hospice, however they are unable to provide level of oxygen needs that patient requires.  On discussion today, patient's daughter is not 100% on board with comfort care but realizes that patient continues to decline.  I do think that patient is a residential hospice candidate, without her level of oxygen, likely has days to live. Very poor prognosis. Anticipate hospital death.    DVT prophylaxis:  enoxaparin (LOVENOX) injection 40 mg Start: 12/27/20 1000  Code Status: DNR, confirmed with 2 daughters over     Status is: Inpatient  Dispo: The patient is from: Home.               Anticipated d/c is to: Anticipate hospital death.               Anticipated d/c date is: Likely days.  Patient currently not stable for DC.   Difficult to place patient N/A        Objective: Vitals:   01/02/21 0400 01/02/21 0500 01/02/21 0820 01/02/21 1540  BP: (!) 178/102     Pulse: 90 100 95 (!) 103  Resp:      Temp:      TempSrc:      SpO2: 92% 93% 95% 92%  Weight:      Height:        Intake/Output Summary (Last 24 hours) at 01/02/2021  1755 Last data filed at 01/02/2021 1600 Gross per 24 hour  Intake 40 ml  Output 250 ml  Net -210 ml   Filed Weights   12/27/20 1159  Weight: 69 kg    Exam:  . General: 87 y.o. year-old female chronically ill-appearing.  Alert, on high flow nasal cannula and nonrebreather. . Cardiovascular: Regular rate and rhythm with no rubs or gallops.  No thyromegaly or JVD noted.   Marland Kitchen Respiratory: Rales bilaterally no wheezing noted. Poor inspiratory effort. . Abdomen: Soft nontender nondistended with normal bowel sounds x4 quadrants. . Musculoskeletal: No lower extremity edema bilaterally. . Skin: No ulcerative lesions noted or rashes. . Psychiatry: Mood is appropriate for condition and setting   Data Reviewed: CBC: Recent Labs  Lab 12/29/20 0523 12/30/20 0515 12/31/20 0436 01/01/21 0443 01/02/21 0500  WBC 10.5 15.2* 17.8* 16.7* 17.2*  NEUTROABS 8.9* 13.7* 16.5* 15.2* 15.6*  HGB 13.0 13.2 13.2 12.0 11.8*  HCT 39.4 40.1 39.9 36.2 35.9*  MCV 79.6* 79.7* 78.1* 79.4* 79.6*  PLT 321 347 332 324 371   Basic Metabolic Panel: Recent Labs  Lab 12/29/20 0523 12/30/20 0515 12/31/20 0436 01/01/21 0443 01/02/21 0500  NA 135 134* 136 141 140  K 3.6 3.6 3.6 3.5 3.5  CL 99 100 102 107 105  CO2 24 23 23 25 26   GLUCOSE 139* 113* 116* 134* 128*  BUN 26* 24* 30* 42* 46*  CREATININE 0.86 0.83 0.81 0.88 0.84  CALCIUM 8.0* 7.9* 7.7* 7.6* 7.7*   GFR: Estimated Creatinine Clearance: 33.8 mL/min (by C-G formula based on SCr of 0.84 mg/dL). Liver Function Tests: Recent Labs  Lab 12/29/20 0523 12/30/20 0515 12/31/20 0436 01/01/21 0443 01/02/21 0500  AST 39 39 51* 51* 71*  ALT 21 20 26 28  37  ALKPHOS 45 44 50 57 84  BILITOT 0.7 0.7 0.8 0.8 1.0  PROT 6.7 6.4* 6.4* 5.5* 6.1*  ALBUMIN 2.7* 2.5* 2.5* 2.2* 2.4*   No results for input(s): LIPASE, AMYLASE in the last 168 hours. No results for input(s): AMMONIA in the last 168 hours. Coagulation Profile: No results for input(s): INR, PROTIME  in the last 168 hours. Cardiac Enzymes: No results for input(s): CKTOTAL, CKMB, CKMBINDEX, TROPONINI in the last 168 hours. BNP (last 3 results) No results for input(s): PROBNP in the last 8760 hours. HbA1C: No results for input(s): HGBA1C in the last 72 hours. CBG: No results for input(s): GLUCAP in the last 168 hours. Lipid Profile: No results for input(s): CHOL, HDL, LDLCALC, TRIG, CHOLHDL, LDLDIRECT in the last 72 hours. Thyroid Function Tests: No results for input(s): TSH, T4TOTAL, FREET4, T3FREE, THYROIDAB in the last 72 hours. Anemia Panel: Recent Labs    12/31/20 0436  FERRITIN 756*   Urine analysis:    Component Value Date/Time   COLORURINE STRAW (A) 03/04/2017 1230   APPEARANCEUR CLEAR 03/04/2017 1230   LABSPEC 1.005 03/04/2017 1230   PHURINE 7.0 03/04/2017 1230   GLUCOSEU NEGATIVE 03/04/2017  1230   HGBUR NEGATIVE 03/04/2017 1230   BILIRUBINUR NEGATIVE 03/04/2017 1230   KETONESUR NEGATIVE 03/04/2017 1230   PROTEINUR NEGATIVE 03/04/2017 1230   NITRITE NEGATIVE 03/04/2017 1230   LEUKOCYTESUR NEGATIVE 03/04/2017 1230   Sepsis Labs: @LABRCNTIP (procalcitonin:4,lacticidven:4)  ) Recent Results (from the past 240 hour(s))  SARS Coronavirus 2 by RT PCR (hospital order, performed in [facility] hospital lab) Nasopharyngeal Nasopharyngeal Swab     Status: Abnormal   Collection Time: 2020-12-31  9:54 PM   Specimen: Nasopharyngeal Swab  Result Value Ref Range Status   SARS Coronavirus 2 POSITIVE (A) NEGATIVE Final    Comment: CRITICAL RESULT CALLED TO, READ BACK BY AND VERIFIED WITH: NIELSON J. 12/31/20 @ 1133 BY MECIAL J. (NOTE) SARS-CoV-2 target nucleic acids are DETECTED  SARS-CoV-2 RNA is generally detectable in upper respiratory specimens  during the acute phase of infection.  Positive results are indicative  of the presence of the identified virus, but do not rule out bacterial infection or co-infection with other pathogens not detected by the test.  Clinical  correlation with patient history and  other diagnostic information is necessary to determine patient infection status.  The expected result is negative.  Fact Sheet for Patients:   01-19-1972   Fact Sheet for Healthcare Providers:   BoilerBrush.com.cy    This test is not yet approved or cleared by the https://pope.com/ FDA and  has been authorized for detection and/or diagnosis of SARS-CoV-2 by FDA under an Emergency Use Authorization (EUA).  This EUA will remain in effect (me aning this test can be used) for the duration of  the COVID-19 declaration under Section 564(b)(1) of the Act, 21 U.S.C. section 360-bbb-3(b)(1), unless the authorization is terminated or revoked sooner.  Performed at [facility], [address], [place], [place]   Blood Culture (routine x 2)     Status: None   Collection Time: 12/31/20  9:54 PM   Specimen: BLOOD  Result Value Ref Range Status   Specimen Description   Final    BLOOD LEFT ANTECUBITAL Performed at [facility], [address], [place], [place]    Special Requests   Final    BOTTLES DRAWN AEROBIC AND ANAEROBIC Blood Culture adequate volume Performed at [facility], [address] [place], [place]    Culture   Final    NO GROWTH 5 DAYS Performed at [facility], [address], [place], [place]    Report Status 01/01/2021 FINAL  Final  Blood Culture (routine x 2)     Status: None   Collection Time: 31-Dec-2020  9:54 PM   Specimen: BLOOD  Result Value Ref Range Status   Specimen Description   Final    BLOOD LEFT HAND Performed at [facility], [address], [place], [place]    Special Requests   Final    BOTTLES DRAWN AEROBIC AND ANAEROBIC Blood Culture adequate volume Performed at [facility], [address], [place], [place]    Culture   Final    NO GROWTH 5 DAYS Performed at [facility], [address] [place], [place]    Report Status 01/01/2021 FINAL  Final      Studies: No results found.  Scheduled Meds: . baricitinib  4 mg Oral Daily  . Chlorhexidine Gluconate Cloth  6 each Topical Daily  . enoxaparin (LOVENOX) injection  40 mg  Subcutaneous Q24H  . methylPREDNISolone (SOLU-MEDROL) injection  40 mg Intravenous Q12H    Continuous Infusions:   LOS: 7 days     Darlin Drop, MD [facility] Pager [phone]  If 7PM-7AM, please contact night-coverage [email or web address] Password [facility] 01/02/2021, 5:55 PM

## 2021-01-02 NOTE — Progress Notes (Signed)
Occupational Therapy Discharge Patient Details Name: Ann Coleman MRN: [id number] DOB: Jan 30, 1924 Today's Date: 01/02/2021 Time:  -     Patient discharged from OT services secondary to medical decline - will need to re-order OT to resume therapy services. Patient transitioning to comfort care/hospice services.  Please see latest therapy progress note for current level of functioning and progress toward goals.    Ann Coleman OT OT pager: [phone]   GO     Ann Coleman 01/02/2021, 6:24 AM

## 2021-01-03 ENCOUNTER — Inpatient Hospital Stay (HOSPITAL_COMMUNITY): Payer: Medicare Other

## 2021-01-03 DIAGNOSIS — R531 Weakness: Secondary | ICD-10-CM | POA: Diagnosis not present

## 2021-01-03 DIAGNOSIS — Z7189 Other specified counseling: Secondary | ICD-10-CM | POA: Diagnosis not present

## 2021-01-03 DIAGNOSIS — U071 COVID-19: Secondary | ICD-10-CM | POA: Diagnosis not present

## 2021-01-03 DIAGNOSIS — J1282 Pneumonia due to coronavirus disease 2019: Secondary | ICD-10-CM | POA: Diagnosis not present

## 2021-01-03 DIAGNOSIS — Z515 Encounter for palliative care: Secondary | ICD-10-CM | POA: Diagnosis not present

## 2021-01-03 LAB — CBC WITH DIFFERENTIAL/PLATELET
Abs Immature Granulocytes: 0.22 10*3/uL — ABNORMAL HIGH (ref 0.00–0.07)
Basophils Absolute: 0.1 10*3/uL (ref 0.0–0.1)
Basophils Relative: 0 %
Eosinophils Absolute: 0 10*3/uL (ref 0.0–0.5)
Eosinophils Relative: 0 %
HCT: 35.8 % — ABNORMAL LOW (ref 36.0–46.0)
Hemoglobin: 11.8 g/dL — ABNORMAL LOW (ref 12.0–15.0)
Immature Granulocytes: 1 %
Lymphocytes Relative: 1 %
Lymphs Abs: 0.2 10*3/uL — ABNORMAL LOW (ref 0.7–4.0)
MCH: 26.2 pg (ref 26.0–34.0)
MCHC: 33 g/dL (ref 30.0–36.0)
MCV: 79.4 fL — ABNORMAL LOW (ref 80.0–100.0)
Monocytes Absolute: 1.2 10*3/uL — ABNORMAL HIGH (ref 0.1–1.0)
Monocytes Relative: 5 %
Neutro Abs: 21.1 10*3/uL — ABNORMAL HIGH (ref 1.7–7.7)
Neutrophils Relative %: 93 %
Platelets: 325 10*3/uL (ref 150–400)
RBC: 4.51 MIL/uL (ref 3.87–5.11)
RDW: 14.5 % (ref 11.5–15.5)
WBC: 22.8 10*3/uL — ABNORMAL HIGH (ref 4.0–10.5)
nRBC: 0.1 % (ref 0.0–0.2)

## 2021-01-03 LAB — COMPREHENSIVE METABOLIC PANEL
ALT: 44 U/L (ref 0–44)
AST: 77 U/L — ABNORMAL HIGH (ref 15–41)
Albumin: 2.3 g/dL — ABNORMAL LOW (ref 3.5–5.0)
Alkaline Phosphatase: 80 U/L (ref 38–126)
Anion gap: 9 (ref 5–15)
BUN: 43 mg/dL — ABNORMAL HIGH (ref 8–23)
CO2: 24 mmol/L (ref 22–32)
Calcium: 7.8 mg/dL — ABNORMAL LOW (ref 8.9–10.3)
Chloride: 109 mmol/L (ref 98–111)
Creatinine, Ser: 0.85 mg/dL (ref 0.44–1.00)
GFR, Estimated: >60 mL/min (ref >60)
Glucose, Bld: 133 mg/dL — ABNORMAL HIGH (ref 70–99)
Potassium: 3.7 mmol/L (ref 3.5–5.1)
Sodium: 142 mmol/L (ref 135–145)
Total Bilirubin: 1.2 mg/dL (ref 0.3–1.2)
Total Protein: 5.9 g/dL — ABNORMAL LOW (ref 6.5–8.1)

## 2021-01-03 LAB — BLOOD GAS, ARTERIAL
Acid-Base Excess: 3 mmol/L — ABNORMAL HIGH (ref 0.0–2.0)
Bicarbonate: 27.4 mmol/L (ref 20.0–28.0)
Drawn by: 560031
FIO2: 72
O2 Saturation: 86.7 %
Patient temperature: 98.6
pCO2 arterial: 42.8 mmHg (ref 32.0–48.0)
pH, Arterial: 7.422 (ref 7.350–7.450)
pO2, Arterial: 53.3 mmHg — ABNORMAL LOW (ref 83.0–108.0)

## 2021-01-03 LAB — C-REACTIVE PROTEIN: CRP: 4.8 mg/dL — ABNORMAL HIGH (ref <1.0)

## 2021-01-03 LAB — PROCALCITONIN: Procalcitonin: 0.18 ng/mL

## 2021-01-03 LAB — BRAIN NATRIURETIC PEPTIDE: B Natriuretic Peptide: 163.8 pg/mL — ABNORMAL HIGH (ref 0.0–100.0)

## 2021-01-03 LAB — D-DIMER, QUANTITATIVE: D-Dimer, Quant: 3.96 ug/mL-FEU — ABNORMAL HIGH (ref 0.00–0.50)

## 2021-01-03 MED ORDER — PIPERACILLIN-TAZOBACTAM 3.375 G IVPB
3.3750 g | Freq: Three times a day (TID) | INTRAVENOUS | Status: DC
  Administered 2021-01-03 – 2021-01-07 (×14): 3.375 g via INTRAVENOUS
  Filled 2021-01-03 (×14): qty 50

## 2021-01-03 NOTE — Progress Notes (Addendum)
Pharmacy Antibiotic Note  Ann Coleman is a 87 y.o. female admitted on 12-28-20 with Covid pneumonia.  Received Ceftriaxone and Azithromycin 1/26-1/31.  Pharmacy has been consulted for Zosyn dosing for suspected aspiration pneumonia.  Plan: Zosyn 3.375g IV q8h (4 hour infusion).  Need for further dosage adjustment appears unlikely at present.    Will sign off at this time.  Please reconsult if a change in clinical status warrants re-evaluation of dosage.     Height: 4\' 11"  (149.9 cm) Weight: 69 kg (152 lb 1.9 oz) IBW/kg (Calculated) : 43.2  Temp (24hrs), Avg:97.8 F (36.6 C), Min:97.2 F (36.2 C), Max:98.2 F (36.8 C)  Recent Labs  Lab 12/30/20 0515 12/31/20 0436 01/01/21 0443 01/02/21 0500 01/03/21 0427  WBC 15.2* 17.8* 16.7* 17.2* 22.8*  CREATININE 0.83 0.81 0.88 0.84 0.85    Estimated Creatinine Clearance: 33.4 mL/min (by C-G formula based on SCr of 0.85 mg/dL).    No Known Allergies   Thank you for allowing pharmacy to be a part of this patient's care.  03/03/21, PharmD 01/03/2021 6:48 AM

## 2021-01-03 NOTE — Progress Notes (Signed)
Daily Progress Note   Patient Name: Ann Coleman       Date: 01/03/2021 DOB: Jun 11, 1924  Age: 87 y.o. MRN#: [id number] Attending Physician: Darlin Drop, DO Primary Care Physician: Patient, No Pcp Per Admit Date: Jan 22, 2021  Reason for Consultation/Follow-up: Terminal Care  Subjective:  remains with high O2 requirements, family visited yesterday   Length of Stay: 8  Current Medications: Scheduled Meds:  . baricitinib  4 mg Oral Daily  . Chlorhexidine Gluconate Cloth  6 each Topical Daily  . enoxaparin (LOVENOX) injection  40 mg Subcutaneous Q24H  . methylPREDNISolone (SOLU-MEDROL) injection  40 mg Intravenous Q12H    Continuous Infusions: . piperacillin-tazobactam (ZOSYN)  IV 3.375 g (01/03/21 0732)    PRN Meds: acetaminophen, albuterol, ALPRAZolam, morphine injection, ondansetron (ZOFRAN) IV, traMADol  Physical Exam         Awake not alert No distress On high O2 requirements No edema Abdomen not distended.   Vital Signs: BP (!) 157/99 (BP Location: Left Arm)   Pulse (!) 112   Temp 98.7 F (37.1 C) (Axillary)   Resp (!) 22   Ht 4\' 11"  (1.499 m)   Wt 69 kg   SpO2 90%   BMI 30.72 kg/m  SpO2: SpO2: 90 % O2 Device: O2 Device: High Flow Nasal Cannula O2 Flow Rate: O2 Flow Rate (L/min): 15 L/min  Intake/output summary:   Intake/Output Summary (Last 24 hours) at 01/03/2021 1242 Last data filed at 01/02/2021 1820 Gross per 24 hour  Intake 40 ml  Output 275 ml  Net -235 ml   LBM: Last BM Date:  (unknown date) Baseline Weight: Weight: 69 kg Most recent weight: Weight: 69 kg       Palliative Assessment/Data:      Patient Active Problem List   Diagnosis Date Noted  . Pneumonia due to COVID-19 virus 12/27/2020  . Morbid obesity (HCC)   . Prediabetes   . Dyspnea   .  Diastolic dysfunction   . Wheezing   . Elevated blood pressure reading   . CKD (chronic kidney disease), stage II   . Chronic pain of both knees   . Right hemiparesis (HCC)   . AKI (acute kidney injury) (HCC)   . Chest pressure   . Essential hypertension   . Labile blood pressure   . Dysphagia, post-stroke   .  Brainstem infarct, acute (HCC) 06/02/2020  . Left pontine cerebrovascular accident (HCC) 06/02/2020  . Acute CVA (cerebrovascular accident) (HCC) 05/29/2020  . HLD (hyperlipidemia) 05/29/2020  . Lacunar infarction (HCC) 05/10/2017  . Stroke (HCC) 03/05/2017  . Acute right MCA stroke (HCC) 03/04/2017  . Hypertensive urgency 03/04/2017  . Stroke (cerebrum) (HCC) 03/04/2017    Palliative Care Assessment & Plan   Patient Profile:    Assessment:  87 year old Cambodian speaking lady past medical history of stroke hypertension diastolic dysfunction dyslipidemia chronic bilateral lower extremity pain.  Patient had Covid exposure at home with family members positive for Covid, presented with significant weakness shortness of breath found to be hypoxic at the time of initial evaluation found to have bilateral pneumonia Covid test result was positive and patient continued to decompensate, requiring 15 L high flow oxygen as well as non rebreather.  Patient remains admitted to hospital medicine service underwent appropriate treatment for her Covid pneumonia according to current guidelines.  Patient had ongoing decline in her functional status as well as in her respiratory status.  It was determined that the next best step would be for the patient to transition home with hospice services and this was being arranged.  However the patient continues to have high oxygen requirements and for additional goals of care discussions with the patient's family a palliative consultation has been requested.   Recommendations/Plan:  Continue low dose PRN IV Morphine for pain and or dyspnea.  Patient  has required 2 doses of low-dose IV morphine in the past 24 hours.  Continue to support patient and family.   Anticipated hospital death.  Recommend comfort measures, hence, call placed and discussed again with [name].  Her 3 children would also like to come visit with the patient.  She states that there is still some family members they would like to come visit.  Discussed with her about current visitor restrictions especially in the setting of Covid.  Discussed with her about comfort measures and avoiding prolongation of suffering.  She asks if there is any funeral assistance, I will request TOC input.  Goals of Care and Additional Recommendations:  Limitations on Scope of Treatment: Minimize Medications  Code Status:    Code Status Orders  (From admission, onward)         Start     Ordered   12/27/20 1102  Do not attempt resuscitation (DNR)  Continuous       Question Answer Comment  In the event of cardiac or respiratory ARREST Do not call a "code blue"   In the event of cardiac or respiratory ARREST Do not perform Intubation, CPR, defibrillation or ACLS   In the event of cardiac or respiratory ARREST Use medication by any route, position, wound care, and other measures to relive pain and suffering. May use oxygen, suction and manual treatment of airway obstruction as needed for comfort.      12/27/20 1101        Code Status History    Date Active Date Inactive Code Status Order ID Comments User Context   12/20/2020 2326 12/27/2020 1101 Full Code [id number]  Rometta Emery, MD ED   06/02/2020 1618 06/22/2020 1642 Full Code [id number]  Charlton Amor, PA-C Inpatient   06/02/2020 1618 06/02/2020 1618 DNR [id number]  Charlton Amor, PA-C Inpatient   05/29/2020 2353 06/02/2020 1611 DNR [id number]  John Giovanni, MD ED   03/04/2017 2352 03/06/2017 1905 Full Code [id number]  Eduard Clos, MD Inpatient  Advance Care Planning Activity       Prognosis:   Hours -  Days  Discharge Planning:  Anticipated Hospital Death  Care plan was discussed with  IDT.   Thank you for allowing the Palliative Medicine Team to assist in the care of this patient.   Time In: 10 Time Out: 10.25 Total Time 25 Prolonged Time Billed  no       Greater than 50%  of this time was spent counseling and coordinating care related to the above assessment and plan.  [name], MD  Please contact Palliative Medicine Team phone at [phone] for questions and concerns.

## 2021-01-03 NOTE — Progress Notes (Signed)
PROGRESS NOTE  Ann Coleman BOF:[id number] DOB: October 20, 1924 DOA: 01-21-2021 PCP: Patient, No Pcp Per  HPI/Recap of past 24 hours: Ann Coleman is a Cambodian-speaking 87 y.o.femalewith medical history significant ofmultiple episode of CVA, essential hypertension, diastolic dysfunction, hyperlipidemia, chronic bilateral lower extremity pain who is known to have Covid exposure at home with family members positive with Covid. Patient was having significant weakness or shortness of breath. When EMS arrived she was found to be hypoxic. Patient brought to the ER where she is evaluated. She appears to have bilateral pneumonia.  She initially required 2 L of oxygen in the emergency department.  Covid test resulted positive. Patient continued to decompensate requiring 15 L high flow oxygen as well as nonrebreather. Due to language barrier and hearing loss, patient has continued to take her oxygen mask off.  01/03/21: Started on IV antibiotic due to suspected superimposed bacterial pulmonary infection.  Seen at bedside.  No family present.  Somnolent but arousable to voices.  Assessment/Plan: Principal Problem:   Pneumonia due to COVID-19 virus Active Problems:   HLD (hyperlipidemia)   Essential hypertension   CKD (chronic kidney disease), stage II   Diastolic dysfunction   Acute hypoxemic respiratory failure secondary to COVID-19 viral pneumonia, suspected superimposed bacterial pulmonary infection, possible aspiration POA -In the emergency department, SPO2 70% on room air and required nonrebreather at 15 L, and now on NRB+HFNC.   -CTA negative for PE -Maintain O2 saturation greater than 90%. Started Zosyn on 01/03/2021  COVID-19 viral pneumonia, POA -Tested positive on 1/26 -Completed 5 days of Remdesivir -Continue steroids 1/26 >>  -Continue baricitinib 1/27 >> -Also completed Rocephin and azithromycin due to elevated procalcitonin, initial concern for bacterial infection -Supportive treatments as  ordered, encourage IS, encourage prone positioning, encourage mobilization  -Due to elevated D-dimer, DVT ultrasound negative to DVT   Recent Labs       Lab Results  Component Value Date   SARSCOV2NAA POSITIVE (A) 2021-01-21   SARSCOV2NAA NEGATIVE 05/29/2020     Dysphagia with concern for possible aspiration in the setting of history of CVA Aspiration precautions Started Zosyn on 01/03/2021 due to suspected aspiration pneumonia.  CKD stage IIIa -Continue to monitor BMP  History of CVA  -Residual dysphagia  Left thyroid nodule -Recommend thyroid ultrasound for work-up as an outpatient  Goals of care -Despite aggressive treatment for COVID-19, patient continues to decompensate, requiring high levels of oxygen, inflammatory markers increasing. Palliative care medicine was consulted.  Daughter was initially interested in home hospice, however they are unable to provide level of oxygen needs that patient requires.  On discussion today, patient's daughter is not 100% on board with comfort care but realizes that patient continues to decline.  I do think that patient is a residential hospice candidate, without her level of oxygen, likely has days to live. Very poor prognosis. Anticipate hospital death.  Physical debility/generalized weakness Overall poor prognosis Continue to treat underlying conditions    DVT prophylaxis:  enoxaparin (LOVENOX) injection 40 mg Start: 12/27/20 1000  Code Status: DNR, confirmed with 2 daughters over     Status is: Inpatient  Dispo: The patient is from: Home.               Anticipated d/c is to: Anticipate hospital death.               Anticipated d/c date is: Likely days.              Patient currently not stable for DC.  Difficult to place patient N/A        Objective: Vitals:   01/03/21 1006 01/03/21 1400 01/03/21 1500 01/03/21 1600  BP:  (!) 145/85 (!) 156/86 (!) 147/91  Pulse:  (!) 104 (!) 108 (!) 105  Resp:      Temp:     98.8 F (37.1 C)  TempSrc:    Axillary  SpO2: 90% 96%    Weight:      Height:       No intake or output data in the 24 hours ending 01/03/21 Paulo Fruit Filed Weights   12/27/20 1159  Weight: 69 kg    Exam:  . General: 87 y.o. year-old female chronically ill-appearing, weak, somnolent but easily arousable to voices.   . Cardiovascular: Regular rate and rhythm no rubs or gallops. Marland Kitchen Respiratory: Mild rales diffusely bilaterally.  Poor respiratory effort. . Abdomen: Soft normal bowel sounds present.   . Musculoskeletal: No lower extremity edema bilaterally. . Skin: No ulcerative lesions noted.   Marland Kitchen Psychiatry: Unable to assess mood due to somnolence.   Data Reviewed: CBC: Recent Labs  Lab 12/30/20 0515 12/31/20 0436 01/01/21 0443 01/02/21 0500 01/03/21 0427  WBC 15.2* 17.8* 16.7* 17.2* 22.8*  NEUTROABS 13.7* 16.5* 15.2* 15.6* 21.1*  HGB 13.2 13.2 12.0 11.8* 11.8*  HCT 40.1 39.9 36.2 35.9* 35.8*  MCV 79.7* 78.1* 79.4* 79.6* 79.4*  PLT 347 332 324 371 325   Basic Metabolic Panel: Recent Labs  Lab 12/30/20 0515 12/31/20 0436 01/01/21 0443 01/02/21 0500 01/03/21 0427  NA 134* 136 141 140 142  K 3.6 3.6 3.5 3.5 3.7  CL 100 102 107 105 109  CO2 23 23 25 26 24   GLUCOSE 113* 116* 134* 128* 133*  BUN 24* 30* 42* 46* 43*  CREATININE 0.83 0.81 0.88 0.84 0.85  CALCIUM 7.9* 7.7* 7.6* 7.7* 7.8*   GFR: Estimated Creatinine Clearance: 33.4 mL/min (by C-G formula based on SCr of 0.85 mg/dL). Liver Function Tests: Recent Labs  Lab 12/30/20 0515 12/31/20 0436 01/01/21 0443 01/02/21 0500 01/03/21 0427  AST 39 51* 51* 71* 77*  ALT 20 26 28  37 44  ALKPHOS 44 50 57 84 80  BILITOT 0.7 0.8 0.8 1.0 1.2  PROT 6.4* 6.4* 5.5* 6.1* 5.9*  ALBUMIN 2.5* 2.5* 2.2* 2.4* 2.3*   No results for input(s): LIPASE, AMYLASE in the last 168 hours. No results for input(s): AMMONIA in the last 168 hours. Coagulation Profile: No results for input(s): INR, PROTIME in the last 168 hours. Cardiac  Enzymes: No results for input(s): CKTOTAL, CKMB, CKMBINDEX, TROPONINI in the last 168 hours. BNP (last 3 results) No results for input(s): PROBNP in the last 8760 hours. HbA1C: No results for input(s): HGBA1C in the last 72 hours. CBG: No results for input(s): GLUCAP in the last 168 hours. Lipid Profile: No results for input(s): CHOL, HDL, LDLCALC, TRIG, CHOLHDL, LDLDIRECT in the last 72 hours. Thyroid Function Tests: No results for input(s): TSH, T4TOTAL, FREET4, T3FREE, THYROIDAB in the last 72 hours. Anemia Panel: No results for input(s): VITAMINB12, FOLATE, FERRITIN, TIBC, IRON, RETICCTPCT in the last 72 hours. Urine analysis:    Component Value Date/Time   COLORURINE STRAW (A) 03/04/2017 1230   APPEARANCEUR CLEAR 03/04/2017 1230   LABSPEC 1.005 03/04/2017 1230   PHURINE 7.0 03/04/2017 1230   GLUCOSEU NEGATIVE 03/04/2017 1230   HGBUR NEGATIVE 03/04/2017 1230   BILIRUBINUR NEGATIVE 03/04/2017 1230   KETONESUR NEGATIVE 03/04/2017 1230   PROTEINUR NEGATIVE 03/04/2017 1230   NITRITE NEGATIVE 03/04/2017  1230   LEUKOCYTESUR NEGATIVE 03/04/2017 1230   Sepsis Labs: @LABRCNTIP (procalcitonin:4,lacticidven:4)  ) Recent Results (from the past 240 hour(s))  SARS Coronavirus 2 by RT PCR (hospital order, performed in [facility] hospital lab) Nasopharyngeal Nasopharyngeal Swab     Status: Abnormal   Collection Time: 12/07/2020  9:54 PM   Specimen: Nasopharyngeal Swab  Result Value Ref Range Status   SARS Coronavirus 2 POSITIVE (A) NEGATIVE Final    Comment: CRITICAL RESULT CALLED TO, READ BACK BY AND VERIFIED WITH: NIELSON J. 1.26.22 @ 1133 BY MECIAL J. (NOTE) SARS-CoV-2 target nucleic acids are DETECTED  SARS-CoV-2 RNA is generally detectable in upper respiratory specimens  during the acute phase of infection.  Positive results are indicative  of the presence of the identified virus, but do not rule out bacterial infection or co-infection with other pathogens not detected by the  test.  Clinical correlation with patient history and  other diagnostic information is necessary to determine patient infection status.  The expected result is negative.  Fact Sheet for Patients:   BoilerBrush.com.cy   Fact Sheet for Healthcare Providers:   https://pope.com/    This test is not yet approved or cleared by the [place] FDA and  has been authorized for detection and/or diagnosis of SARS-CoV-2 by FDA under an Emergency Use Authorization (EUA).  This EUA will remain in effect (me aning this test can be used) for the duration of  the COVID-19 declaration under Section 564(b)(1) of the Act, 21 U.S.C. section 360-bbb-3(b)(1), unless the authorization is terminated or revoked sooner.  Performed at [facility], [address] [place], [place] [address]   Blood Culture (routine x 2)     Status: None   Collection Time: 12/30/2020  9:54 PM   Specimen: BLOOD  Result Value Ref Range Status   Specimen Description   Final    BLOOD LEFT ANTECUBITAL Performed at [facility], [address], [place], [place] [address]    Special Requests   Final    BOTTLES DRAWN AEROBIC AND ANAEROBIC Blood Culture adequate volume Performed at [facility], [address], [place], [place] [address]    Culture   Final    NO GROWTH 5 DAYS Performed at [facility], [address] [place], [place] [address]    Report Status 01/01/2021 FINAL  Final  Blood Culture (routine x 2)     Status: None   Collection Time: 12/11/2020  9:54 PM   Specimen: BLOOD  Result Value Ref Range Status   Specimen Description   Final    BLOOD LEFT HAND Performed at [facility], [address], [place], [place] [address]    Special Requests   Final    BOTTLES DRAWN AEROBIC AND ANAEROBIC Blood Culture adequate volume Performed at [facility], [address],  [place], [place] [address]    Culture   Final    NO GROWTH 5 DAYS Performed at [facility], [address] [place], [place] [address]    Report Status 01/01/2021 FINAL  Final      Studies: DG CHEST PORT 1 VIEW  Result Date: 01/03/2021 CLINICAL DATA:  Acute respiratory failure EXAM: PORTABLE CHEST 1 VIEW COMPARISON:  12/18/2020 FINDINGS: Cardiomegaly and aortic tortuosity. Hazy bilateral airspace disease attributed to COVID pattern pneumonia. No visible effusion or pneumothorax. There is artifact from BiPAP. IMPRESSION: Similar degree of bilateral pneumonia. Electronically Signed   By: Marnee Spring  M.D.   On: 01/03/2021 07:51    Scheduled Meds: . baricitinib  4 mg Oral Daily  . Chlorhexidine Gluconate Cloth  6 each Topical Daily  . enoxaparin (LOVENOX) injection  40 mg Subcutaneous Q24H  . methylPREDNISolone (SOLU-MEDROL) injection  40 mg Intravenous Q12H    Continuous Infusions: . piperacillin-tazobactam (ZOSYN)  IV 3.375 g (01/03/21 1425)     LOS: 8 days     Darlin Drop, MD [facility] Pager [phone]  If 7PM-7AM, please contact night-coverage [email or web address] Password [facility] 01/03/2021, 6:38 PM

## 2021-01-03 NOTE — Plan of Care (Signed)
  Problem: Respiratory: Goal: Will maintain a patent airway Outcome: Progressing   Problem: Clinical Measurements: Goal: Will remain free from infection Outcome: Progressing Goal: Respiratory complications will improve Outcome: Progressing   Problem: Pain Managment: Goal: General experience of comfort will improve Outcome: Progressing   Problem: Safety: Goal: Ability to remain free from injury will improve Outcome: Progressing   Problem: Skin Integrity: Goal: Risk for impaired skin integrity will decrease Outcome: Progressing

## 2021-01-03 NOTE — Progress Notes (Signed)
Very restless.Taking off oxygen mitten placed on hands. Turning head back and forth. States no more and pointing upward.. Placed back on oxygen. Morphine 1mg  given IVP. Will cont to assess Resp status.

## 2021-01-03 NOTE — TOC Progression Note (Signed)
Transition of Care (TOC) - Progression Note    Patient Details  Name: Ann Coleman MRN: [id number] Date of Birth: 1923/12/21  Transition of Care [facility]) CM/SW Contact  Geni Bers, RN Phone Number: 01/03/2021, 1:06 PM  Clinical Narrative:     Spoke with pt's daughter Ann Coleman concerning question she asked MD. Ann Coleman asked about funeral assistance from the government regarding patient dying from COVID. Instructed Ann Coleman that she will need to call a [facility] of her choice to ask for this information. Ann Coleman understood.     Expected Discharge Plan: Home/Self Care    Expected Discharge Plan and Services Expected Discharge Plan: Home/Self Care                                               Social Determinants of Health (SDOH) Interventions    Readmission Risk Interventions No flowsheet data found.

## 2021-01-03 NOTE — Plan of Care (Signed)
Patient refuses to take in any food or fluids, even when offered by family.  Patient weaned to 15 liters HFNC with oxygen saturation in the low to mid 90's.  Multiple family members at bedside.  Medicated with MS x 3 on 7 a to 7 p shift d/t patient moaning and appearing uncomfortable.

## 2021-01-04 ENCOUNTER — Inpatient Hospital Stay (HOSPITAL_COMMUNITY): Payer: Medicare Other

## 2021-01-04 ENCOUNTER — Encounter (HOSPITAL_COMMUNITY): Payer: Self-pay | Admitting: Internal Medicine

## 2021-01-04 ENCOUNTER — Other Ambulatory Visit: Payer: Self-pay

## 2021-01-04 DIAGNOSIS — Z7189 Other specified counseling: Secondary | ICD-10-CM | POA: Diagnosis not present

## 2021-01-04 DIAGNOSIS — J1282 Pneumonia due to coronavirus disease 2019: Secondary | ICD-10-CM | POA: Diagnosis not present

## 2021-01-04 DIAGNOSIS — Z515 Encounter for palliative care: Secondary | ICD-10-CM | POA: Diagnosis not present

## 2021-01-04 DIAGNOSIS — U071 COVID-19: Secondary | ICD-10-CM | POA: Diagnosis not present

## 2021-01-04 LAB — CBC WITH DIFFERENTIAL/PLATELET
Abs Immature Granulocytes: 0.17 10*3/uL — ABNORMAL HIGH (ref 0.00–0.07)
Basophils Absolute: 0.1 10*3/uL (ref 0.0–0.1)
Basophils Relative: 0 %
Eosinophils Absolute: 0 10*3/uL (ref 0.0–0.5)
Eosinophils Relative: 0 %
HCT: 36.6 % (ref 36.0–46.0)
Hemoglobin: 12 g/dL (ref 12.0–15.0)
Immature Granulocytes: 1 %
Lymphocytes Relative: 1 %
Lymphs Abs: 0.1 10*3/uL — ABNORMAL LOW (ref 0.7–4.0)
MCH: 25.9 pg — ABNORMAL LOW (ref 26.0–34.0)
MCHC: 32.8 g/dL (ref 30.0–36.0)
MCV: 78.9 fL — ABNORMAL LOW (ref 80.0–100.0)
Monocytes Absolute: 0.9 10*3/uL (ref 0.1–1.0)
Monocytes Relative: 5 %
Neutro Abs: 18.9 10*3/uL — ABNORMAL HIGH (ref 1.7–7.7)
Neutrophils Relative %: 93 %
Platelets: 341 10*3/uL (ref 150–400)
RBC: 4.64 MIL/uL (ref 3.87–5.11)
RDW: 14.9 % (ref 11.5–15.5)
WBC: 20.2 10*3/uL — ABNORMAL HIGH (ref 4.0–10.5)
nRBC: 0 % (ref 0.0–0.2)

## 2021-01-04 LAB — COMPREHENSIVE METABOLIC PANEL
ALT: 47 U/L — ABNORMAL HIGH (ref 0–44)
ALT: 49 U/L — ABNORMAL HIGH (ref 0–44)
AST: 65 U/L — ABNORMAL HIGH (ref 15–41)
AST: 86 U/L — ABNORMAL HIGH (ref 15–41)
Albumin: 2.4 g/dL — ABNORMAL LOW (ref 3.5–5.0)
Albumin: 2.2 g/dL — ABNORMAL LOW (ref 3.5–5.0)
Alkaline Phosphatase: 80 U/L (ref 38–126)
Alkaline Phosphatase: 92 U/L (ref 38–126)
Anion gap: 9 (ref 5–15)
Anion gap: 10 (ref 5–15)
BUN: 52 mg/dL — ABNORMAL HIGH (ref 8–23)
BUN: 56 mg/dL — ABNORMAL HIGH (ref 8–23)
CO2: 28 mmol/L (ref 22–32)
CO2: 29 mmol/L (ref 22–32)
Calcium: 8 mg/dL — ABNORMAL LOW (ref 8.9–10.3)
Calcium: 7.7 mg/dL — ABNORMAL LOW (ref 8.9–10.3)
Chloride: 111 mmol/L (ref 98–111)
Chloride: 110 mmol/L (ref 98–111)
Creatinine, Ser: 1.1 mg/dL — ABNORMAL HIGH (ref 0.44–1.00)
Creatinine, Ser: 1.14 mg/dL — ABNORMAL HIGH (ref 0.44–1.00)
GFR, Estimated: 46 mL/min — ABNORMAL LOW (ref >60)
GFR, Estimated: 44 mL/min — ABNORMAL LOW (ref >60)
Glucose, Bld: 143 mg/dL — ABNORMAL HIGH (ref 70–99)
Glucose, Bld: 163 mg/dL — ABNORMAL HIGH (ref 70–99)
Potassium: 3.8 mmol/L (ref 3.5–5.1)
Potassium: 3.9 mmol/L (ref 3.5–5.1)
Sodium: 148 mmol/L — ABNORMAL HIGH (ref 135–145)
Sodium: 149 mmol/L — ABNORMAL HIGH (ref 135–145)
Total Bilirubin: 2 mg/dL — ABNORMAL HIGH (ref 0.3–1.2)
Total Bilirubin: 1.7 mg/dL — ABNORMAL HIGH (ref 0.3–1.2)
Total Protein: 6.2 g/dL — ABNORMAL LOW (ref 6.5–8.1)
Total Protein: 5.8 g/dL — ABNORMAL LOW (ref 6.5–8.1)

## 2021-01-04 LAB — D-DIMER, QUANTITATIVE: D-Dimer, Quant: 3.79 ug/mL-FEU — ABNORMAL HIGH (ref 0.00–0.50)

## 2021-01-04 LAB — LIPASE, BLOOD: Lipase: 28 U/L (ref 11–51)

## 2021-01-04 LAB — C-REACTIVE PROTEIN: CRP: 5.5 mg/dL — ABNORMAL HIGH (ref <1.0)

## 2021-01-04 MED ORDER — ENSURE ENLIVE PO LIQD: 237.0000 mL | Freq: Two times a day (BID) | ORAL | Status: DC

## 2021-01-04 MED ORDER — PANTOPRAZOLE SODIUM 40 MG IV SOLR
40.0000 mg | INTRAVENOUS | Status: DC
  Administered 2021-01-04: 40 mg via INTRAVENOUS
  Filled 2021-01-04: qty 40

## 2021-01-04 MED ORDER — ENOXAPARIN SODIUM 30 MG/0.3ML ~~LOC~~ SOLN
30.0000 mg | SUBCUTANEOUS | Status: DC
  Administered 2021-01-05: 30 mg via SUBCUTANEOUS
  Filled 2021-01-04 (×2): qty 0.3

## 2021-01-04 MED ORDER — TRAMADOL HCL 50 MG PO TABS: 50.0000 mg | ORAL_TABLET | Freq: Four times a day (QID) | ORAL | Status: DC | PRN

## 2021-01-04 MED ORDER — MORPHINE SULFATE (PF) 2 MG/ML IV SOLN
1.0000 mg | INTRAVENOUS | Status: DC | PRN
  Administered 2021-01-04 – 2021-01-07 (×15): 1 mg via INTRAVENOUS
  Filled 2021-01-04 (×15): qty 1

## 2021-01-04 MED ORDER — DEXTROSE-NACL 5-0.45 % IV SOLN: INTRAVENOUS | Status: DC

## 2021-01-04 MED ORDER — BARICITINIB 1 MG PO TABS
1.0000 mg | ORAL_TABLET | Freq: Every day | ORAL | Status: DC
  Administered 2021-01-05: 1 mg via ORAL
  Filled 2021-01-04: qty 1

## 2021-01-04 MED ORDER — PANTOPRAZOLE SODIUM 40 MG IV SOLR
40.0000 mg | Freq: Two times a day (BID) | INTRAVENOUS | Status: DC
  Administered 2021-01-04 – 2021-01-07 (×6): 40 mg via INTRAVENOUS
  Filled 2021-01-04 (×6): qty 40

## 2021-01-04 MED ORDER — METOPROLOL TARTRATE 5 MG/5ML IV SOLN
2.5000 mg | INTRAVENOUS | Status: DC | PRN
  Administered 2021-01-04 – 2021-01-06 (×4): 2.5 mg via INTRAVENOUS
  Filled 2021-01-04 (×4): qty 5

## 2021-01-04 MED ORDER — IPRATROPIUM-ALBUTEROL 20-100 MCG/ACT IN AERS
1.0000 | INHALATION_SPRAY | Freq: Four times a day (QID) | RESPIRATORY_TRACT | Status: DC
  Administered 2021-01-05 (×2): 1 via RESPIRATORY_TRACT
  Filled 2021-01-04: qty 4

## 2021-01-04 MED ORDER — METOPROLOL TARTRATE 5 MG/5ML IV SOLN
5.0000 mg | Freq: Three times a day (TID) | INTRAVENOUS | Status: DC
  Administered 2021-01-04 – 2021-01-05 (×3): 5 mg via INTRAVENOUS
  Filled 2021-01-04 (×3): qty 5

## 2021-01-04 NOTE — Progress Notes (Signed)
PROGRESS NOTE  Ann Coleman SJG:[id number] DOB: 1924/11/16 DOA: 01-13-2021 PCP: Patient, No Pcp Per  HPI/Recap of past 24 hours: Ann Coleman is a Cambodian-speaking 87 y.o.femalewith medical history significant ofmultiple episode of CVA, essential hypertension, diastolic dysfunction, hyperlipidemia, chronic bilateral lower extremity pain who is known to have Covid exposure at home with family members positive with Covid. Patient was having significant weakness or shortness of breath. When EMS arrived she was found to be hypoxic. Patient brought to the ER where she is evaluated. She appears to have bilateral pneumonia.  She initially required 2 L of oxygen in the emergency department.  Covid test resulted positive. Patient continued to decompensate requiring 15 L high flow oxygen as well as nonrebreather. Due to language barrier and hearing loss, patient has continued to take her oxygen mask off.  Started on IV antibiotic Zosyn on 01/03/21 due to suspected superimposed bacterial pulmonary infection.    01/04/21: Seen and examined at bedside.  No family present.  She indicates abdominal pain on palpation.  Added IV Protonix.  Acutely elevated LFTs this morning with negative lipase.  Will obtain abdominal ultrasound to further assess.  Assessment/Plan: Principal Problem:   Pneumonia due to COVID-19 virus Active Problems:   HLD (hyperlipidemia)   Essential hypertension   CKD (chronic kidney disease), stage II   Diastolic dysfunction   Acute hypoxemic respiratory failure secondary to COVID-19 viral pneumonia, suspected superimposed bacterial pulmonary infection, possible aspiration POA -In the emergency department, SPO2 70% on room air and required nonrebreather at 15 L, and now on NRB+HFNC.   -CTA negative for PE -Maintain O2 saturation greater than 90%. Continue Zosyn (01/03/2021>>) day #2  COVID-19 viral pneumonia, POA -Tested positive on 2021-01-13 -Completed 5 days of Remdesivir -Continue steroids  1/26 >>  -Continue baricitinib 1/27 >> -Also completed Rocephin and azithromycin due to elevated procalcitonin, initial concern for bacterial infection -Continue supportive treatments as ordered, encourage IS, encourage prone positioning, encourage mobilization  -Due to elevated D-dimer, DVT ultrasound negative to DVT  -Restarted IV antibiotics on 01/03/2021, Zosyn.  AKI, prerenal in the setting of dehydration from poor oral intake Patient has had poor oral intake Continue to encourage increase in oral intake. Baseline creatinine 0.8 with GFR greater than 60 Creatinine uptrending 1.10 with GFR 46 Started gentle IV fluid hydration D5 half-normal saline at 30 cc/h x 2 days. Patient's family has been updated, daughter whom she lives with will also encourage the patient to eat as she can tolerate. Monitor urine output Avoid nephrotoxins Repeat renal panel in the morning  Left lower quadrant abdominal pain/epigastric pain on palpation. Tenderness with palpation on exam Lipase level 28, normal. Elevated liver chemistries, AST 65, ALT 47, T bili 2.0. Obtain imaging to further assess.  Moderate protein calorie malnutrition Albumin 2.4 Moderate muscle mass loss. Continue to encourage increase in oral protein calorie intake. Added Ensure Added feeding assistance Dietitian consult  Dysphagia with concern for aspiration in the setting of history of CVA Continue aspiration precautions Continue Zosyn day #2 due to suspected aspiration pneumonia.  History of CVA  -Residual dysphagia PT OT to assess Fall and aspiration precautions  Left thyroid nodule -Recommend thyroid ultrasound for work-up as an outpatient  Goals of care DNR Appreciate palliative care team assistance.  Physical debility/generalized weakness Continue to treat underlying conditions Daughter wants to treat the treatable for now.   DVT prophylaxis:  enoxaparin (LOVENOX) injection 40 mg Start: 12/27/20  1000  Code Status: DNR, confirmed with 2 daughters over  Status is: Inpatient  Dispo: The patient is from: Home.               Anticipated d/c is to: Home with home health services.              Anticipated d/c date is: 01/12/2021.              Patient currently not stable for DC ongoing treatment for suspected aspiration pneumonia.   Difficult to place patient N/A        Objective: Vitals:   01/04/21 0500 01/04/21 0858 01/04/21 1325 01/04/21 1444  BP:   (!) 172/93   Pulse:   (!) 113   Resp:   20   Temp: 98.6 F (37 C)  98.2 F (36.8 C)   TempSrc:   Axillary   SpO2:  91%  96%  Weight:      Height:        Intake/Output Summary (Last 24 hours) at 01/04/2021 1505 Last data filed at 01/04/2021 0400 Gross per 24 hour  Intake 150 ml  Output 450 ml  Net -300 ml   Filed Weights   12/27/20 1159  Weight: 69 kg    Exam:  . General: 87 y.o. year-old female chronically ill-appearing in no acute distress.  She is alert appears uncomfortable due to abdominal pain.   . Cardiovascular: Regular rate and rhythm no rubs or gallops.   Marland Kitchen Respiratory: Mild rales at bases no wheezing noted.  Poor respiratory effort.  On high flow nasal cannula. . Abdomen: Soft tender left lower quadrant and epigastric regions. . Musculoskeletal: No extremity edema bilaterally. . Skin: No ulcerative lesions noted. Marland Kitchen Psychiatry: Mood is appropriate for condition and setting.  Data Reviewed: CBC: Recent Labs  Lab 12/31/20 0436 01/01/21 0443 01/02/21 0500 01/03/21 0427 01/04/21 0419  WBC 17.8* 16.7* 17.2* 22.8* 20.2*  NEUTROABS 16.5* 15.2* 15.6* 21.1* 18.9*  HGB 13.2 12.0 11.8* 11.8* 12.0  HCT 39.9 36.2 35.9* 35.8* 36.6  MCV 78.1* 79.4* 79.6* 79.4* 78.9*  PLT 332 324 371 325 341   Basic Metabolic Panel: Recent Labs  Lab 12/31/20 0436 01/01/21 0443 01/02/21 0500 01/03/21 0427 01/04/21 0419  NA 136 141 140 142 148*  K 3.6 3.5 3.5 3.7 3.8  CL 102 107 105 109 111  CO2 23 25 26 24 28    GLUCOSE 116* 134* 128* 133* 143*  BUN 30* 42* 46* 43* 52*  CREATININE 0.81 0.88 0.84 0.85 1.10*  CALCIUM 7.7* 7.6* 7.7* 7.8* 8.0*   GFR: Estimated Creatinine Clearance: 25.8 mL/min (A) (by C-G formula based on SCr of 1.1 mg/dL (H)). Liver Function Tests: Recent Labs  Lab 12/31/20 0436 01/01/21 0443 01/02/21 0500 01/03/21 0427 01/04/21 0419  AST 51* 51* 71* 77* 65*  ALT 26 28 37 44 47*  ALKPHOS 50 57 84 80 80  BILITOT 0.8 0.8 1.0 1.2 2.0*  PROT 6.4* 5.5* 6.1* 5.9* 6.2*  ALBUMIN 2.5* 2.2* 2.4* 2.3* 2.4*   Recent Labs  Lab 01/04/21 0419  LIPASE 28   No results for input(s): AMMONIA in the last 168 hours. Coagulation Profile: No results for input(s): INR, PROTIME in the last 168 hours. Cardiac Enzymes: No results for input(s): CKTOTAL, CKMB, CKMBINDEX, TROPONINI in the last 168 hours. BNP (last 3 results) No results for input(s): PROBNP in the last 8760 hours. HbA1C: No results for input(s): HGBA1C in the last 72 hours. CBG: No results for input(s): GLUCAP in the last 168 hours. Lipid Profile: No results for  input(s): CHOL, HDL, LDLCALC, TRIG, CHOLHDL, LDLDIRECT in the last 72 hours. Thyroid Function Tests: No results for input(s): TSH, T4TOTAL, FREET4, T3FREE, THYROIDAB in the last 72 hours. Anemia Panel: No results for input(s): VITAMINB12, FOLATE, FERRITIN, TIBC, IRON, RETICCTPCT in the last 72 hours. Urine analysis:    Component Value Date/Time   COLORURINE STRAW (A) 03/04/2017 1230   APPEARANCEUR CLEAR 03/04/2017 1230   LABSPEC 1.005 03/04/2017 1230   PHURINE 7.0 03/04/2017 1230   GLUCOSEU NEGATIVE 03/04/2017 1230   HGBUR NEGATIVE 03/04/2017 1230   BILIRUBINUR NEGATIVE 03/04/2017 1230   KETONESUR NEGATIVE 03/04/2017 1230   PROTEINUR NEGATIVE 03/04/2017 1230   NITRITE NEGATIVE 03/04/2017 1230   LEUKOCYTESUR NEGATIVE 03/04/2017 1230   Sepsis Labs: @LABRCNTIP (procalcitonin:4,lacticidven:4)  ) Recent Results (from the past 240 hour(s))  SARS Coronavirus  2 by RT PCR (hospital order, performed in [facility] hospital lab) Nasopharyngeal Nasopharyngeal Swab     Status: Abnormal   Collection Time: 12/13/2020  9:54 PM   Specimen: Nasopharyngeal Swab  Result Value Ref Range Status   SARS Coronavirus 2 POSITIVE (A) NEGATIVE Final    Comment: CRITICAL RESULT CALLED TO, READ BACK BY AND VERIFIED WITH: NIELSON J. 1.26.22 @ 1133 BY MECIAL J. (NOTE) SARS-CoV-2 target nucleic acids are DETECTED  SARS-CoV-2 RNA is generally detectable in upper respiratory specimens  during the acute phase of infection.  Positive results are indicative  of the presence of the identified virus, but do not rule out bacterial infection or co-infection with other pathogens not detected by the test.  Clinical correlation with patient history and  other diagnostic information is necessary to determine patient infection status.  The expected result is negative.  Fact Sheet for Patients:   01-19-1972   Fact Sheet for Healthcare Providers:   BoilerBrush.com.cy    This test is not yet approved or cleared by the https://pope.com/ FDA and  has been authorized for detection and/or diagnosis of SARS-CoV-2 by FDA under an Emergency Use Authorization (EUA).  This EUA will remain in effect (me aning this test can be used) for the duration of  the COVID-19 declaration under Section 564(b)(1) of the Act, 21 U.S.C. section 360-bbb-3(b)(1), unless the authorization is terminated or revoked sooner.  Performed at [facility], [address], [place], [place]   Blood Culture (routine x 2)     Status: None   Collection Time: 12/21/2020  9:54 PM   Specimen: BLOOD  Result Value Ref Range Status   Specimen Description   Final    BLOOD LEFT ANTECUBITAL Performed at [facility], [address], [place], [place]    Special Requests   Final    BOTTLES DRAWN AEROBIC AND ANAEROBIC  Blood Culture adequate volume Performed at [facility], [address] [place], [place]    Culture   Final    NO GROWTH 5 DAYS Performed at [facility], [address], [place], [place]    Report Status 01/01/2021 FINAL  Final  Blood Culture (routine x 2)     Status: None   Collection Time: 12/22/2020  9:54 PM   Specimen: BLOOD  Result Value Ref Range Status   Specimen Description   Final    BLOOD LEFT HAND Performed at [facility], [address], [place], [place]    Special Requests   Final    BOTTLES DRAWN AEROBIC AND ANAEROBIC Blood Culture adequate volume Performed at  [facility], [address], [place], [place] [address]    Culture   Final    NO GROWTH 5 DAYS Performed at [facility], [address], [place], [place] [address]    Report Status 01/01/2021 FINAL  Final      Studies: No results found.  Scheduled Meds: . [START ON 01/05/2021] baricitinib  1 mg Oral Daily  . Chlorhexidine Gluconate Cloth  6 each Topical Daily  . [START ON 01/05/2021] enoxaparin (LOVENOX) injection  30 mg Subcutaneous Q24H  . Ipratropium-Albuterol  1 puff Inhalation Q6H  . methylPREDNISolone (SOLU-MEDROL) injection  40 mg Intravenous Q12H  . metoprolol tartrate  5 mg Intravenous Q8H  . pantoprazole (PROTONIX) IV  40 mg Intravenous Q24H    Continuous Infusions: . dextrose 5 % and 0.45% NaCl 30 mL/hr at 01/04/21 0705  . piperacillin-tazobactam (ZOSYN)  IV 3.375 g (01/04/21 0706)     LOS: 9 days     Darlin Drop, MD [facility] Pager [phone]  If 7PM-7AM, please contact night-coverage [email or web address] Password [email or web address] 01/04/2021, 3:05 PM

## 2021-01-04 NOTE — Plan of Care (Signed)
  Problem: Respiratory: Goal: Will maintain a patent airway Outcome: Progressing   Problem: Pain Managment: Goal: General experience of comfort will improve Outcome: Progressing   Problem: Safety: Goal: Ability to remain free from injury will improve Outcome: Progressing   Problem: Skin Integrity: Goal: Risk for impaired skin integrity will decrease Outcome: Progressing

## 2021-01-04 NOTE — Consult Note (Signed)
Referring Provider: [facility] Primary Care Physician:  Patient, No Pcp Per Primary Gastroenterologist: None (unassigned)  Reason for Consultation: Choledocholithiasis and abdominal pain with elevated liver chemistries.  HPI: Ann Coleman is a 87 y.o. female admitted to the hospital 9 days ago with hypoxia and found to be Covid positive. She had Covid pneumonia requiring aggressive oxygen therapy and, more recently, has had a spike in her white count and possible aspiration bacterial pneumonia superimposed on that, for which reason Zosyn was started yesterday.   Today, the patient was noted to be in abdominal pain and have tenderness, for which reason an abdominal CT (no IV contrast) was obtained which shows evidence of choledocholithiasis with biliary ductal dilatation. No other source of abdominal pain evident. I have reviewed the CT findings with the on-call radiologist by telephone, and he indicates that there is some degree of intrahepatic biliary ductal dilatation, raising at least the potential possibility of the patient undergoing a PTC.  The patient has mildly elevated liver chemistries, with transaminases in the 50-100 range, and bilirubin of 1.7, but these are only marginally increased over the past several days. Lipase is normal.  The patient has not been eating well although today apparently took a small amount of soft food, per conversation with the attending physician, Dr. Irene Pap.  The patient is on a DNR status, and from review of the palliative care note today, it sounds as though the patient's daughter is potentially interested in medical therapy for the patient, beyond simple comfort care. Accordingly, we were asked to see the patient with respect to the bile duct problem.   Past Medical History:  Diagnosis Date  . High cholesterol   . Hypertension   . Stroke [facility])     Past Surgical History:  Procedure Laterality Date  . NO PAST SURGERIES      Prior to  Admission medications   Not on File    Current Facility-Administered Medications  Medication Dose Route Frequency Provider Last Rate Last Admin  . acetaminophen (TYLENOL) tablet 650 mg  650 mg Oral Q6H PRN Dessa Phi, DO   650 mg at 12/31/20 1513  . albuterol (VENTOLIN HFA) 108 (90 Base) MCG/ACT inhaler 1-2 puff  1-2 puff Inhalation Q4H PRN Dessa Phi, DO      . ALPRAZolam Duanne Moron) tablet 0.25 mg  0.25 mg Oral BID PRN Dessa Phi, DO   0.25 mg at 01/04/21 0256  . [START ON 01/05/2021] baricitinib (OLUMIANT) tablet 1 mg  1 mg Oral Daily Minda Ditto, RPH      . Chlorhexidine Gluconate Cloth 2 % PADS 6 each  6 each Topical Daily Dessa Phi, DO   6 each at 01/04/21 1054  . dextrose 5 %-0.45 % sodium chloride infusion   Intravenous Continuous Kayleen Memos, DO 30 mL/hr at 01/04/21 1615 New Bag at 01/04/21 1615  . [START ON 01/05/2021] enoxaparin (LOVENOX) injection 30 mg  30 mg Subcutaneous Q24H Hall, Carole N, DO      . [START ON 01/05/2021] feeding supplement (ENSURE ENLIVE / ENSURE PLUS) liquid 237 mL  237 mL Oral BID BM Hall, Carole N, DO      . Ipratropium-Albuterol (COMBIVENT) respimat 1 puff  1 puff Inhalation Q6H Hall, Carole N, DO      . methylPREDNISolone sodium succinate (SOLU-MEDROL) 40 mg/mL injection 40 mg  40 mg Intravenous Q12H Dessa Phi, DO   40 mg at 01/04/21 1047  . metoprolol tartrate (LOPRESSOR) injection 2.5 mg  2.5 mg Intravenous  Q4H PRN Irene Pap N, DO   2.5 mg at 01/04/21 1708  . metoprolol tartrate (LOPRESSOR) injection 5 mg  5 mg Intravenous Q8H Hall, Carole N, DO   5 mg at 01/04/21 1403  . morphine 2 MG/ML injection 1 mg  1 mg Intravenous Q4H PRN Irene Pap N, DO   1 mg at 01/04/21 1851  . ondansetron (ZOFRAN) injection 4 mg  4 mg Intravenous Q6H PRN Dessa Phi, DO      . pantoprazole (PROTONIX) injection 40 mg  40 mg Intravenous Q12H Hall, Carole N, DO      . piperacillin-tazobactam (ZOSYN) IVPB 3.375 g  3.375 g Intravenous Q8H Poindexter, Leann  T, RPH 12.5 mL/hr at 01/04/21 1616 3.375 g at 01/04/21 1616  . traMADol (ULTRAM) tablet 50 mg  50 mg Oral Q6H PRN Irene Pap N, DO        Allergies as of 12/30/2020  . (No Known Allergies)    Family History  Problem Relation Age of Onset  . Stroke Neg Hx   . Diabetes Mellitus II Neg Hx   . Hypertension Neg Hx     Social History   Socioeconomic History  . Marital status: Divorced    Spouse name: Not on file  . Number of children: Not on file  . Years of education: Not on file  . Highest education level: Not on file  Occupational History  . Not on file  Tobacco Use  . Smoking status: Never Smoker  . Smokeless tobacco: Never Used  Vaping Use  . Vaping Use: Never used  Substance and Sexual Activity  . Alcohol use: No  . Drug use: No  . Sexual activity: Never  Other Topics Concern  . Not on file  Social History Narrative   Lives w/ daughter   Right-handed   No caffeine   Social Determinants of Radio broadcast assistant Strain: Not on file  Food Insecurity: Not on file  Transportation Needs: Not on file  Physical Activity: Not on file  Stress: Not on file  Social Connections: Not on file  Intimate Partner Violence: Not on file    Review of Systems: Unobtainable  Physical Exam: Vital signs in last 24 hours: Temp:  [98.2 F (36.8 C)-98.6 F (37 C)] 98.2 F (36.8 C) (02/04 1325) Pulse Rate:  [100-113] 113 (02/04 1325) Resp:  [20] 20 (02/04 1325) BP: (168-172)/(93-95) 172/93 (02/04 1325) SpO2:  [91 %-96 %] 96 % (02/04 1444) Last BM Date:  (unknown date)   This is an Asian female appearing [name] younger than her age, lying in bed in no distress.  She is looking about the room but does not really engage significantly with her eyes.  She is anicteric and without overt pallor.  Chest is clear anterolaterally.  She is in no respiratory distress whatsoever, on high flow nasal oxygen with an oxygen saturation of 95%.  Vitals are normal, heart rate 93, blood  pressure 180/100.  Skin is warm and well perfused.  Radial pulses recent fall.  Abdomen is nondistended, mildly adipose, quiet bowel sounds, no rigidity, rather soft, minimal if any guarding, certainly no peritoneal findings.  Intake/Output from previous day: 02/03 0701 - 02/04 0700 In: 150 [IV Piggyback:150] Out: 450 [Urine:450] Intake/Output this shift: No intake/output data recorded.  Lab Results: Recent Labs    01/02/21 0500 01/03/21 0427 01/04/21 0419  WBC 17.2* 22.8* 20.2*  HGB 11.8* 11.8* 12.0  HCT 35.9* 35.8* 36.6  PLT 371 325 341  BMET Recent Labs    01/03/21 0427 01/04/21 0419 01/04/21 1807  NA 142 148* 149*  K 3.7 3.8 3.9  CL 109 111 110  CO2 _0 GLUCOSE 133* 143* 163*  BUN 43* 52* 56*  CREATININE 0.85 1.10* 1.14*  CALCIUM 7.8* 8.0* 7.7*   LFT Recent Labs    01/04/21 1807  PROT 5.8*  ALBUMIN 2.2*  AST 86*  ALT 49*  ALKPHOS 92  BILITOT 1.7*   PT/INR No results for input(s): LABPROT, INR in the last 72 hours.  Studies/Results: CT ABDOMEN PELVIS WO CONTRAST  Addendum Date: 01/04/2021   ADDENDUM REPORT: 01/04/2021 16:36 ADDENDUM: These results were called by telephone at the time of interpretation on 01/04/2021 at 4:36 pm to provider [facility] , who verbally acknowledged these results. Electronically Signed   By: Valentino Saxon MD   On: 01/04/2021 16:36   Result Date: 01/04/2021 CLINICAL DATA:  Abdominal pain EXAM: CT ABDOMEN AND PELVIS WITHOUT CONTRAST TECHNIQUE: Multidetector CT imaging of the abdomen and pelvis was performed following the standard protocol without IV contrast. COMPARISON:  December 27, 2020, January second 2005 FINDINGS: Lower chest: Multifocal bilateral peripheral predominant ground-glass and consolidative opacities. Cardiomegaly. Hepatobiliary: There is dilation of the common bile duct. Patient status post cholecystectomy. There is a choledocholithiasis at the level of the ampullary which measures 8 mm. Common bile duct  measures approximately 16 mm. Pancreas: There is mild prominence of the pancreatic duct. No peripancreatic fat stranding. Spleen: Unremarkable. Adrenals/Urinary Tract: Adrenal glands are unremarkable. No hydronephrosis. Bladder is decompressed around a Foley catheter. Stomach/Bowel: No evidence of bowel obstruction. Diverticulosis. Appendix is normal. Vascular/Lymphatic: Scattered atherosclerotic calcifications of the aorta. No suspicious lymphadenopathy. Reproductive: Uterus is present. Other: There is a small RIGHT retroperitoneal hematoma. There is fluid tracking along the liver and anterior to the IVC. Musculoskeletal: No acute osseous abnormality. IMPRESSION: 1. Small RIGHT retroperitoneal hematoma. 2. There is an 8 mm choledocholithiasis at the level of the ampulla with upstream dilation of the common bile duct. 3. Multifocal bilateral peripheral predominant ground-glass and consolidative opacities consistent with the sequela of COVID-19 infection. Aortic Atherosclerosis (ICD10-I70.0). PRA system was activated at 4:14 p.m. on 01/04/2021. Addendum will be issued upon telephone delivery of results. Electronically Signed: By: Valentino Saxon MD On: 01/04/2021 16:19   DG CHEST PORT 1 VIEW  Result Date: 01/03/2021 CLINICAL DATA:  Acute respiratory failure EXAM: PORTABLE CHEST 1 VIEW COMPARISON:  12/09/2020 FINDINGS: Cardiomegaly and aortic tortuosity. Hazy bilateral airspace disease attributed to COVID pattern pneumonia. No visible effusion or pneumothorax. There is artifact from BiPAP. IMPRESSION: Similar degree of bilateral pneumonia. Electronically Signed   By: Monte Fantasia M.D.   On: 01/03/2021 07:51    Impression: 1.  Biliary obstruction associated with common duct stone 2.  Mild elevation of liver chemistries 3.  Covid infection with recent hypoxic respiratory failure, now with superimposed possible bacterial (possibly aspiration) pneumonia    Plan: I would favor continued antibiotic  therapy for possible smoldering cholangitis, as long as supportive care is desired in terms of overall goals of care..    Given the patient's rather tenuous respiratory status, doing an ERCP (which would require general anesthesia) might need to a ventilator-dependent state and in my opinion is strongly inadvisable.    From discussion with the radiologist over the telephone today, it sounds as though the patient would likely be a candidate for a percutaneous transhepatic cholangiogram to drain the biliary system, from the technical standpoint.  However,  that would leave her with a drain which would probably be uncomfortable.  Most importantly, it is not clear to what degree any of the patient's current findings including leukocytosis and general deterioration are related to the biliary system.  This patient does not have the hemodynamic or biochemical findings that I would expect to be present if she had septic cholangitis.  Specifically, the very modest, relatively stable elevation of her liver enzymes would go against that diagnosis.    Therefore, I have not at all confident that biliary drainage would improve her clinical condition, even if it were possible to perform it given this patient's tenuous medical status.  Hence the recommendation for continued antibiotic therapy, rather than some sort of procedural intervention.  I have spoken with the patient's daughter, [name], on the telephone with respect to the above recommendation and she is agreeable, and does not currently have any questions.   LOS: 9 days   [place]  01/04/2021, 8:17 PM   Pager [phone] If no answer or after 5 PM call [phone]

## 2021-01-04 NOTE — Progress Notes (Signed)
PT Cancellation Note  Patient Details Name: Ann Coleman MRN: [id number] DOB: 04/28/1924    Cancelled Treatment:      per palliative care MD note--"Anticipated hospital death"  Pt does not appear to be indicated at this time.      [facility] : [facility] 01/04/2021, 5:30 PM

## 2021-01-04 NOTE — Progress Notes (Signed)
Daily Progress Note   Patient Name: Ann Coleman       Date: 01/04/2021 DOB: 03-20-1924  Age: 87 y.o. MRN#: [id number] Attending Physician: Darlin Drop, DO Primary Care Physician: Patient, No Pcp Per Admit Date: 12/03/2020  Reason for Consultation/Follow-up: Terminal Care  Subjective:  remains with high O2 requirements, family visited yesterday, discussed with daughter regarding end of life signs and symptoms on the phone on 01-03-21.    Length of Stay: 9  Current Medications: Scheduled Meds:  . [START ON 01/05/2021] baricitinib  1 mg Oral Daily  . Chlorhexidine Gluconate Cloth  6 each Topical Daily  . enoxaparin (LOVENOX) injection  40 mg Subcutaneous Q24H  . Ipratropium-Albuterol  1 puff Inhalation Q6H  . methylPREDNISolone (SOLU-MEDROL) injection  40 mg Intravenous Q12H  . pantoprazole (PROTONIX) IV  40 mg Intravenous Q24H    Continuous Infusions: . dextrose 5 % and 0.45% NaCl 30 mL/hr at 01/04/21 0705  . piperacillin-tazobactam (ZOSYN)  IV 3.375 g (01/04/21 0706)    PRN Meds: acetaminophen, albuterol, ALPRAZolam, metoprolol tartrate, morphine injection, ondansetron (ZOFRAN) IV, traMADol  Physical Exam         Awake not alert No distress On high O2 requirements No edema Abdomen not distended.   Vital Signs: BP (!) 168/95 (BP Location: Left Arm)   Pulse (!) 113   Temp 98.6 F (37 C)   Resp 20   Ht 4\' 11"  (1.499 m)   Wt 69 kg   SpO2 91%   BMI 30.72 kg/m  SpO2: SpO2: 91 % O2 Device: O2 Device: High Flow Nasal Cannula O2 Flow Rate: O2 Flow Rate (L/min): 15 L/min  Intake/output summary:   Intake/Output Summary (Last 24 hours) at 01/04/2021 1232 Last data filed at 01/04/2021 0400 Gross per 24 hour  Intake 150 ml  Output 450 ml  Net -300 ml   LBM: Last BM Date:  (unknown  date) Baseline Weight: Weight: 69 kg Most recent weight: Weight: 69 kg       Palliative Assessment/Data:      Patient Active Problem List   Diagnosis Date Noted  . Pneumonia due to COVID-19 virus 12/27/2020  . Morbid obesity (HCC)   . Prediabetes   . Dyspnea   . Diastolic dysfunction   . Wheezing   . Elevated blood pressure reading   .  CKD (chronic kidney disease), stage II   . Chronic pain of both knees   . Right hemiparesis (HCC)   . AKI (acute kidney injury) (HCC)   . Chest pressure   . Essential hypertension   . Labile blood pressure   . Dysphagia, post-stroke   . Brainstem infarct, acute (HCC) 06/02/2020  . Left pontine cerebrovascular accident (HCC) 06/02/2020  . Acute CVA (cerebrovascular accident) (HCC) 05/29/2020  . HLD (hyperlipidemia) 05/29/2020  . Lacunar infarction (HCC) 05/10/2017  . Stroke (HCC) 03/05/2017  . Acute right MCA stroke (HCC) 03/04/2017  . Hypertensive urgency 03/04/2017  . Stroke (cerebrum) (HCC) 03/04/2017    Palliative Care Assessment & Plan   Patient Profile:    Assessment:  87 year old Cambodian speaking lady past medical history of stroke hypertension diastolic dysfunction dyslipidemia chronic bilateral lower extremity pain.  Patient had Covid exposure at home with family members positive for Covid, presented with significant weakness shortness of breath found to be hypoxic at the time of initial evaluation found to have bilateral pneumonia Covid test result was positive and patient continued to decompensate, requiring 15 L high flow oxygen as well as non rebreather.  Patient remains admitted to hospital medicine service underwent appropriate treatment for her Covid pneumonia according to current guidelines.  Patient had ongoing decline in her functional status as well as in her respiratory status.  It was determined that the next best step would be for the patient to transition home with hospice services and this was being arranged.   However the patient continues to have high oxygen requirements and for additional goals of care discussions with the patient's family a palliative consultation has been requested.   Recommendations/Plan:  Continue low dose PRN IV Morphine for pain and or dyspnea.  Patient has required 3-4 doses of low-dose IV morphine in the past 24 hours.  Continue to support patient and family.   Anticipated hospital death.  Appreciate TOC input.  Goals of Care and Additional Recommendations:  Limitations on Scope of Treatment: Minimize Medications  Code Status:    Code Status Orders  (From admission, onward)         Start     Ordered   12/27/20 1102  Do not attempt resuscitation (DNR)  Continuous       Question Answer Comment  In the event of cardiac or respiratory ARREST Do not call a "code blue"   In the event of cardiac or respiratory ARREST Do not perform Intubation, CPR, defibrillation or ACLS   In the event of cardiac or respiratory ARREST Use medication by any route, position, wound care, and other measures to relive pain and suffering. May use oxygen, suction and manual treatment of airway obstruction as needed for comfort.      12/27/20 1101        Code Status History    Date Active Date Inactive Code Status Order ID Comments User Context   01/09/21 2326 12/27/2020 1101 Full Code [id number]  Rometta Emery, MD ED   06/02/2020 1618 06/22/2020 1642 Full Code [id number]  Charlton Amor, PA-C Inpatient   06/02/2020 1618 06/02/2020 1618 DNR [id number]  Charlton Amor, PA-C Inpatient   05/29/2020 2353 06/02/2020 1611 DNR [id number]  John Giovanni, MD ED   03/04/2017 2352 03/06/2017 1905 Full Code [id number]  Eduard Clos, MD Inpatient   Advance Care Planning Activity       Prognosis:   Hours - Days  Discharge Planning:  Anticipated Hospital Death  Care plan  was discussed with  IDT.   Thank you for allowing the Palliative Medicine Team to assist in the care of this  patient.   Time In: 10 Time Out: 10.25 Total Time 25 Prolonged Time Billed  no       Greater than 50%  of this time was spent counseling and coordinating care related to the above assessment and plan.  [name], MD  Please contact Palliative Medicine Team phone at [phone] for questions and concerns.

## 2021-01-05 DIAGNOSIS — U071 COVID-19: Secondary | ICD-10-CM | POA: Diagnosis not present

## 2021-01-05 DIAGNOSIS — J1282 Pneumonia due to coronavirus disease 2019: Secondary | ICD-10-CM | POA: Diagnosis not present

## 2021-01-05 LAB — CBC WITH DIFFERENTIAL/PLATELET
Abs Immature Granulocytes: 0.14 10*3/uL — ABNORMAL HIGH (ref 0.00–0.07)
Basophils Absolute: 0.1 10*3/uL (ref 0.0–0.1)
Basophils Relative: 0 %
Eosinophils Absolute: 0 10*3/uL (ref 0.0–0.5)
Eosinophils Relative: 0 %
HCT: 38 % (ref 36.0–46.0)
Hemoglobin: 12 g/dL (ref 12.0–15.0)
Immature Granulocytes: 1 %
Lymphocytes Relative: 1 %
Lymphs Abs: 0.3 10*3/uL — ABNORMAL LOW (ref 0.7–4.0)
MCH: 26.3 pg (ref 26.0–34.0)
MCHC: 31.6 g/dL (ref 30.0–36.0)
MCV: 83.2 fL (ref 80.0–100.0)
Monocytes Absolute: 0.9 10*3/uL (ref 0.1–1.0)
Monocytes Relative: 5 %
Neutro Abs: 18.1 10*3/uL — ABNORMAL HIGH (ref 1.7–7.7)
Neutrophils Relative %: 93 %
Platelets: 263 10*3/uL (ref 150–400)
RBC: 4.57 MIL/uL (ref 3.87–5.11)
RDW: 15.3 % (ref 11.5–15.5)
WBC: 19.5 10*3/uL — ABNORMAL HIGH (ref 4.0–10.5)
nRBC: 0 % (ref 0.0–0.2)

## 2021-01-05 LAB — C-REACTIVE PROTEIN: CRP: 3.2 mg/dL — ABNORMAL HIGH (ref <1.0)

## 2021-01-05 LAB — COMPREHENSIVE METABOLIC PANEL
ALT: 50 U/L — ABNORMAL HIGH (ref 0–44)
AST: 86 U/L — ABNORMAL HIGH (ref 15–41)
Albumin: 2 g/dL — ABNORMAL LOW (ref 3.5–5.0)
Alkaline Phosphatase: 84 U/L (ref 38–126)
Anion gap: 10 (ref 5–15)
BUN: 60 mg/dL — ABNORMAL HIGH (ref 8–23)
CO2: 23 mmol/L (ref 22–32)
Calcium: 7.4 mg/dL — ABNORMAL LOW (ref 8.9–10.3)
Chloride: 113 mmol/L — ABNORMAL HIGH (ref 98–111)
Creatinine, Ser: 1.06 mg/dL — ABNORMAL HIGH (ref 0.44–1.00)
GFR, Estimated: 48 mL/min — ABNORMAL LOW (ref >60)
Glucose, Bld: 163 mg/dL — ABNORMAL HIGH (ref 70–99)
Potassium: 4.6 mmol/L (ref 3.5–5.1)
Sodium: 146 mmol/L — ABNORMAL HIGH (ref 135–145)
Total Bilirubin: 2.2 mg/dL — ABNORMAL HIGH (ref 0.3–1.2)
Total Protein: 5.4 g/dL — ABNORMAL LOW (ref 6.5–8.1)

## 2021-01-05 LAB — D-DIMER, QUANTITATIVE: D-Dimer, Quant: 7.15 ug/mL-FEU — ABNORMAL HIGH (ref 0.00–0.50)

## 2021-01-05 MED ORDER — BARICITINIB 1 MG PO TABS
1.0000 mg | ORAL_TABLET | Freq: Once | ORAL | Status: AC
  Administered 2021-01-05: 1 mg via ORAL
  Filled 2021-01-05: qty 1

## 2021-01-05 MED ORDER — BARICITINIB 2 MG PO TABS: 2.0000 mg | ORAL_TABLET | Freq: Every day | ORAL | Status: DC

## 2021-01-05 MED ORDER — METOPROLOL TARTRATE 5 MG/5ML IV SOLN
5.0000 mg | Freq: Four times a day (QID) | INTRAVENOUS | Status: DC
  Administered 2021-01-05 – 2021-01-07 (×8): 5 mg via INTRAVENOUS
  Filled 2021-01-05 (×8): qty 5

## 2021-01-05 MED ORDER — SODIUM CHLORIDE 0.9 % IV SOLN: INTRAVENOUS | Status: DC | PRN

## 2021-01-05 NOTE — Plan of Care (Signed)
Able to wean patient down to 5 liters HFNC with oxygen saturations in the low to mid 90's.  Medicated for pain x 2 on 7 a to 7 p shift as evidenced by moaning and grimacing.  Patient continues to refuse any po intake, only taking 2-3 small sips of water this shift.  No family at bedside.

## 2021-01-05 NOTE — Progress Notes (Deleted)
87 yo [name] speaking lady with PMH of stroke, hypertension diastolic dysfunction, dyslipidemia, chronic bilateral lower extremity pain. Pt had Covid exposure at home with family members positive for Covid, presented with significant weakness shortness of breath found to be hypoxic at the time of initial evaluation found to have bilateral pneumonia. Covid test result was positive and pt continued to decompensate, requiring 15 L high flow oxygen as well as non rebreather.   Received referral for home hospital. Referral is pending due to pt's condition. Per palliative care MD note, anticipated hospital death.  Will continue to f/u to assist with the D/C plan if prn.

## 2021-01-05 NOTE — Progress Notes (Signed)
PROGRESS NOTE  Ann Coleman YKD:[id number] DOB: 28-Nov-1924 DOA: January 01, 2021 PCP: Patient, No Pcp Per  HPI/Recap of past 24 hours: Ann Coleman is a Cambodian-speaking 87 y.o.femalewith medical history significant ofmultiple episode of CVA, essential hypertension, diastolic dysfunction, hyperlipidemia, chronic bilateral lower extremity pain who is known to have Covid exposure at home with family members positive with Covid. Patient was having significant weakness or shortness of breath. When EMS arrived she was found to be hypoxic. Patient brought to the ER where she is evaluated. She appears to have bilateral pneumonia.  She initially required 2 L of oxygen in the emergency department.  Covid test resulted positive. Patient continued to decompensate requiring 15 L high flow oxygen as well as nonrebreather. Due to language barrier and hearing loss, patient has continued to take her oxygen mask off.  Started on IV antibiotic Zosyn on 01/03/21 due to suspected superimposed bacterial pulmonary infection.   Due to elevated liver chemistry a CT abdomen and pelvis was obtained which revealed choledocholithiasis with common bile duct dilatation for which GI was consulted.  We will continue conservative management for now.  Appreciate GIs assistance.  01/05/21: Seen and examined at bedside.  She is alert but minimally interactive.  She has had poor oral intake. Per her Ann Coleman via phone she had taken 4 spoons of feeding on 01/04/2021.  O2 requirement is improving down to 5 L with oxygen saturation of 94%.   Assessment/Plan: Principal Problem:   Pneumonia due to COVID-19 virus Active Problems:   HLD (hyperlipidemia)   Essential hypertension   CKD (chronic kidney disease), stage II   Diastolic dysfunction   Improving acute hypoxemic respiratory failure secondary to COVID-19 viral pneumonia, suspected superimposed bacterial pulmonary infection, possible aspiration POA -In the emergency department, SPO2 70% on room  air and required nonrebreather at 15 L, then she required NRB+HFNC.    Respiratory status is continually improving. Down to 5 L O2 saturation 94%, continue to wean off as tolerated. -CTA negative for PE -Continue to maintain O2 saturation greater than 90%. Continue Zosyn (01/03/2021>>) day #3  COVID-19 viral pneumonia, POA -Tested positive on 01/01/21 -Completed 5 days of Remdesivir -Continue steroids 1/26 >>  -Continue baricitinib 1/27 >> -Also completed Rocephin and azithromycin due to elevated procalcitonin, initial concern for bacterial infection -Continue supportive treatments as ordered, encourage IS, encourage prone positioning, encourage mobilization  -Due to elevated D-dimer, DVT ultrasound negative to DVT  -Restarted IV antibiotics on 01/03/2021, Zosyn.  Newly diagnosed choledocholithiasis  Appreciate GIs assistance. Continue current medical management.   On IV Zosyn, continue Pain control in place  Chronic constipation Enema Bowel regimen  Improving AKI, prerenal in the setting of dehydration from poor oral intake Patient has had poor oral intake Continue to encourage oral intake. Continue IV fluid hydration, gentle. Continue to avoid nephrotoxins and dehydration Repeat renal panel in the morning.  Moderate protein calorie malnutrition Albumin 2.4 Moderate muscle mass loss. Continue to encourage increase in oral protein calorie intake. Added Ensure Added feeding assistance Dietitian consult  Dysphagia with concern for aspiration in the setting of history of CVA Continue aspiration precautions Continue Zosyn day #3 due to suspected aspiration pneumonia. She has had poor oral intake  History of CVA  -Residual dysphagia PT OT to assess Fall and aspiration precautions  Left thyroid nodule -Recommend thyroid ultrasound for work-up as an outpatient  Goals of care DNR Appreciate palliative care team assistance.  Physical debility/generalized  weakness Continue to treat underlying conditions Ann Coleman wants to treat  the treatable for now.   DVT prophylaxis:  enoxaparin (LOVENOX) injection 40 mg Start: 12/27/20 1000  Code Status: DNR, confirmed with 2 daughters over     Status is: Inpatient  Dispo: The patient is from: Home.               Anticipated d/c is to: Home with home health services.              Anticipated d/c date is: 01/21/2021.              Patient currently not stable for DC ongoing management of aspiration pneumonia and choledocholithiasis.     Difficult to place patient N/A        Objective: Vitals:   01/05/21 0400 01/05/21 0516 01/05/21 0830 01/05/21 1210  BP: 140/83 (!) 170/83  (!) 171/89  Pulse: 78 69  95  Resp: 11 17    Temp:  97.9 F (36.6 C)  97.7 F (36.5 C)  TempSrc:  Axillary  Axillary  SpO2: 94% 94% 99%   Weight:      Height:        Intake/Output Summary (Last 24 hours) at 01/05/2021 1747 Last data filed at 01/05/2021 1744 Gross per 24 hour  Intake 784.19 ml  Output 700 ml  Net 84.19 ml   Filed Weights   12/27/20 1159  Weight: 69 kg    Exam:  . General: 87 y.o. year-old female chronically ill-appearing no acute stress.  Alert and minimally interactive.   . Cardiovascular: Regular rate and rhythm no rubs or gallops. Marland Kitchen Respiratory: Mild rales at bases no wheezing noted.  Poor inspiratory effort. . Abdomen: Soft minimally tender to palpation.  Bowel sounds present. . Musculoskeletal: No lower extremity edema bilaterally.   . Skin: No ulcerative lesions noted. Marland Kitchen Psychiatry: Mood is appropriate for condition setting.  Data Reviewed: CBC: Recent Labs  Lab 01/01/21 0443 01/02/21 0500 01/03/21 0427 01/04/21 0419 01/05/21 0534  WBC 16.7* 17.2* 22.8* 20.2* 19.5*  NEUTROABS 15.2* 15.6* 21.1* 18.9* 18.1*  HGB 12.0 11.8* 11.8* 12.0 12.0  HCT 36.2 35.9* 35.8* 36.6 38.0  MCV 79.4* 79.6* 79.4* 78.9* 83.2  PLT 324 371 325 341 263   Basic Metabolic Panel: Recent Labs  Lab  01/02/21 0500 01/03/21 0427 01/04/21 0419 01/04/21 1807 01/05/21 0534  NA 140 142 148* 149* 146*  K 3.5 3.7 3.8 3.9 4.6  CL 105 109 111 110 113*  CO2 26 24 28 29 23   GLUCOSE 128* 133* 143* 163* 163*  BUN 46* 43* 52* 56* 60*  CREATININE 0.84 0.85 1.10* 1.14* 1.06*  CALCIUM 7.7* 7.8* 8.0* 7.7* 7.4*   GFR: Estimated Creatinine Clearance: 26.8 mL/min (A) (by C-G formula based on SCr of 1.06 mg/dL (H)). Liver Function Tests: Recent Labs  Lab 01/02/21 0500 01/03/21 0427 01/04/21 0419 01/04/21 1807 01/05/21 0534  AST 71* 77* 65* 86* 86*  ALT 37 44 47* 49* 50*  ALKPHOS 84 80 80 92 84  BILITOT 1.0 1.2 2.0* 1.7* 2.2*  PROT 6.1* 5.9* 6.2* 5.8* 5.4*  ALBUMIN 2.4* 2.3* 2.4* 2.2* 2.0*   Recent Labs  Lab 01/04/21 0419  LIPASE 28   No results for input(s): AMMONIA in the last 168 hours. Coagulation Profile: No results for input(s): INR, PROTIME in the last 168 hours. Cardiac Enzymes: No results for input(s): CKTOTAL, CKMB, CKMBINDEX, TROPONINI in the last 168 hours. BNP (last 3 results) No results for input(s): PROBNP in the last 8760 hours. HbA1C: No results for input(s):  HGBA1C in the last 72 hours. CBG: No results for input(s): GLUCAP in the last 168 hours. Lipid Profile: No results for input(s): CHOL, HDL, LDLCALC, TRIG, CHOLHDL, LDLDIRECT in the last 72 hours. Thyroid Function Tests: No results for input(s): TSH, T4TOTAL, FREET4, T3FREE, THYROIDAB in the last 72 hours. Anemia Panel: No results for input(s): VITAMINB12, FOLATE, FERRITIN, TIBC, IRON, RETICCTPCT in the last 72 hours. Urine analysis:    Component Value Date/Time   COLORURINE STRAW (A) 03/04/2017 1230   APPEARANCEUR CLEAR 03/04/2017 1230   LABSPEC 1.005 03/04/2017 1230   PHURINE 7.0 03/04/2017 1230   GLUCOSEU NEGATIVE 03/04/2017 1230   HGBUR NEGATIVE 03/04/2017 1230   BILIRUBINUR NEGATIVE 03/04/2017 1230   KETONESUR NEGATIVE 03/04/2017 1230   PROTEINUR NEGATIVE 03/04/2017 1230   NITRITE NEGATIVE  03/04/2017 1230   LEUKOCYTESUR NEGATIVE 03/04/2017 1230   Sepsis Labs: @LABRCNTIP (procalcitonin:4,lacticidven:4)  ) Recent Results (from the past 240 hour(s))  SARS Coronavirus 2 by RT PCR (hospital order, performed in [facility] hospital lab) Nasopharyngeal Nasopharyngeal Swab     Status: Abnormal   Collection Time: 01-02-21  9:54 PM   Specimen: Nasopharyngeal Swab  Result Value Ref Range Status   SARS Coronavirus 2 POSITIVE (A) NEGATIVE Final    Comment: CRITICAL RESULT CALLED TO, READ BACK BY AND VERIFIED WITH: NIELSON J. 01/02/2021 @ 1133 BY MECIAL J. (NOTE) SARS-CoV-2 target nucleic acids are DETECTED  SARS-CoV-2 RNA is generally detectable in upper respiratory specimens  during the acute phase of infection.  Positive results are indicative  of the presence of the identified virus, but do not rule out bacterial infection or co-infection with other pathogens not detected by the test.  Clinical correlation with patient history and  other diagnostic information is necessary to determine patient infection status.  The expected result is negative.  Fact Sheet for Patients:   01-19-1972   Fact Sheet for Healthcare Providers:   BoilerBrush.com.cy    This test is not yet approved or cleared by the https://pope.com/ FDA and  has been authorized for detection and/or diagnosis of SARS-CoV-2 by FDA under an Emergency Use Authorization (EUA).  This EUA will remain in effect (me aning this test can be used) for the duration of  the COVID-19 declaration under Section 564(b)(1) of the Act, 21 U.S.C. section 360-bbb-3(b)(1), unless the authorization is terminated or revoked sooner.  Performed at [facility], [address], [place], [place]   Blood Culture (routine x 2)     Status: None   Collection Time: 01/02/2021  9:54 PM   Specimen: BLOOD  Result Value Ref Range Status   Specimen Description   Final     BLOOD LEFT ANTECUBITAL Performed at [facility], [address] [place], [place]    Special Requests   Final    BOTTLES DRAWN AEROBIC AND ANAEROBIC Blood Culture adequate volume Performed at [facility], [address] [place], [place]    Culture   Final    NO GROWTH 5 DAYS Performed at [facility], [address] [place], [place]    Report Status 01/01/2021 FINAL  Final  Blood Culture (routine x 2)     Status: None   Collection Time: 2021-01-02  9:54 PM   Specimen: BLOOD  Result Value Ref Range Status   Specimen Description   Final    BLOOD LEFT HAND Performed at [facility], [address] [place], [place]  Special Requests   Final    BOTTLES DRAWN AEROBIC AND ANAEROBIC Blood Culture adequate volume Performed at [facility], [address], [place], [place] [address]    Culture   Final    NO GROWTH 5 DAYS Performed at [facility], [address], [place], [place] [address]    Report Status 01/01/2021 FINAL  Final      Studies: No results found.  Scheduled Meds: . [START ON 01/06/2021] baricitinib  2 mg Oral Daily  . Chlorhexidine Gluconate Cloth  6 each Topical Daily  . enoxaparin (LOVENOX) injection  30 mg Subcutaneous Q24H  . feeding supplement  237 mL Oral BID BM  . Ipratropium-Albuterol  1 puff Inhalation Q6H  . methylPREDNISolone (SOLU-MEDROL) injection  40 mg Intravenous Q12H  . metoprolol tartrate  5 mg Intravenous Q6H  . pantoprazole (PROTONIX) IV  40 mg Intravenous Q12H    Continuous Infusions: . sodium chloride    . dextrose 5 % and 0.45% NaCl 30 mL/hr at 01/04/21 1615  . piperacillin-tazobactam (ZOSYN)  IV 3.375 g (01/05/21 1439)     LOS: 10 days     Darlin Drop, MD [facility] Pager [phone]  If 7PM-7AM, please contact night-coverage [email or web address] Password [email or web address] 01/05/2021, 5:47 PM

## 2021-01-05 NOTE — Progress Notes (Signed)
The patient has been stable overnight.    In fact, from conversation with her nurse, the patient has had progressive respiratory improvement, maintaining oxygen saturations in the 90s despite weaning of high flow oxygen from 13 L to a current level of 9 L.    On exam, the patient looks about the same as last night.  She is looking around the room but not really engaging.  She does moan occasionally, but is not writhing in acute distress.  On exam, the right upper quadrant seems to be nontender but there does seem to be some reproducible tenderness, as evidenced by grimacing but not guarding, to palpation of the left abdomen.  Rectal exam shows no distal fecal impaction but some firm stool at the apex of the rectal ampulla.  Note that the CT report did not mention fecal obstipation and my review of the study does not show significant stool, either.    Liver chemistries and white count are essentially unchanged overnight.  Vital signs, including temperature and oxygen saturation, remain stable.  Impression: Choledocholithiasis, but the patient continues to NOT show a picture of biliary sepsis.  Recommendation: Unchanged from yesterday evening (please see my note).  Discussed with Dr. Nevada Crane.  The patient, with her respiratory improvement, might be a better candidate for interventions such as ERCP or PTC in the next several days if those are felt to be necessary or clinically appropriate, neither of which is clearly the case at the moment.  Cleotis Nipper, M.D. Pager [phone] If no answer or after 5 PM call [phone]

## 2021-01-06 ENCOUNTER — Inpatient Hospital Stay (HOSPITAL_COMMUNITY): Payer: Medicare Other

## 2021-01-06 DIAGNOSIS — U071 COVID-19: Secondary | ICD-10-CM | POA: Diagnosis not present

## 2021-01-06 DIAGNOSIS — J1282 Pneumonia due to coronavirus disease 2019: Secondary | ICD-10-CM | POA: Diagnosis not present

## 2021-01-06 LAB — COMPREHENSIVE METABOLIC PANEL WITH GFR
ALT: 88 U/L — ABNORMAL HIGH (ref 0–44)
AST: 165 U/L — ABNORMAL HIGH (ref 15–41)
Albumin: 2.1 g/dL — ABNORMAL LOW (ref 3.5–5.0)
Alkaline Phosphatase: 86 U/L (ref 38–126)
Anion gap: 10 (ref 5–15)
BUN: 55 mg/dL — ABNORMAL HIGH (ref 8–23)
CO2: 29 mmol/L (ref 22–32)
Calcium: 7.7 mg/dL — ABNORMAL LOW (ref 8.9–10.3)
Chloride: 115 mmol/L — ABNORMAL HIGH (ref 98–111)
Creatinine, Ser: 0.83 mg/dL (ref 0.44–1.00)
GFR, Estimated: >60 mL/min
Glucose, Bld: 159 mg/dL — ABNORMAL HIGH (ref 70–99)
Potassium: 4 mmol/L (ref 3.5–5.1)
Sodium: 154 mmol/L — ABNORMAL HIGH (ref 135–145)
Total Bilirubin: 3.8 mg/dL — ABNORMAL HIGH (ref 0.3–1.2)
Total Protein: 5.3 g/dL — ABNORMAL LOW (ref 6.5–8.1)

## 2021-01-06 LAB — CBC WITH DIFFERENTIAL/PLATELET
Abs Immature Granulocytes: 0.18 10*3/uL — ABNORMAL HIGH (ref 0.00–0.07)
Basophils Absolute: 0.1 10*3/uL (ref 0.0–0.1)
Basophils Relative: 0 %
Eosinophils Absolute: 0 10*3/uL (ref 0.0–0.5)
Eosinophils Relative: 0 %
HCT: 35.5 % — ABNORMAL LOW (ref 36.0–46.0)
Hemoglobin: 11.2 g/dL — ABNORMAL LOW (ref 12.0–15.0)
Immature Granulocytes: 1 %
Lymphocytes Relative: 1 %
Lymphs Abs: 0.2 10*3/uL — ABNORMAL LOW (ref 0.7–4.0)
MCH: 26.1 pg (ref 26.0–34.0)
MCHC: 31.5 g/dL (ref 30.0–36.0)
MCV: 82.8 fL (ref 80.0–100.0)
Monocytes Absolute: 1.1 10*3/uL — ABNORMAL HIGH (ref 0.1–1.0)
Monocytes Relative: 5 %
Neutro Abs: 21.8 10*3/uL — ABNORMAL HIGH (ref 1.7–7.7)
Neutrophils Relative %: 93 %
Platelets: 240 10*3/uL (ref 150–400)
RBC: 4.29 MIL/uL (ref 3.87–5.11)
RDW: 15.5 % (ref 11.5–15.5)
WBC: 23.4 10*3/uL — ABNORMAL HIGH (ref 4.0–10.5)
nRBC: 0 % (ref 0.0–0.2)

## 2021-01-06 LAB — SODIUM
Sodium: 152 mmol/L — ABNORMAL HIGH (ref 135–145)
Sodium: 153 mmol/L — ABNORMAL HIGH (ref 135–145)
Sodium: 152 mmol/L — ABNORMAL HIGH (ref 135–145)

## 2021-01-06 LAB — C-REACTIVE PROTEIN: CRP: 2 mg/dL — ABNORMAL HIGH

## 2021-01-06 LAB — GLUCOSE, CAPILLARY
Glucose-Capillary: 158 mg/dL — ABNORMAL HIGH (ref 70–99)
Glucose-Capillary: 148 mg/dL — ABNORMAL HIGH (ref 70–99)
Glucose-Capillary: 140 mg/dL — ABNORMAL HIGH (ref 70–99)

## 2021-01-06 LAB — D-DIMER, QUANTITATIVE: D-Dimer, Quant: 7.97 ug{FEU}/mL — ABNORMAL HIGH (ref 0.00–0.50)

## 2021-01-06 MED ORDER — ENOXAPARIN SODIUM 40 MG/0.4ML ~~LOC~~ SOLN
40.0000 mg | SUBCUTANEOUS | Status: DC
  Administered 2021-01-06 – 2021-01-07 (×2): 40 mg via SUBCUTANEOUS
  Filled 2021-01-06 (×2): qty 0.4

## 2021-01-06 MED ORDER — OSMOLITE 1.2 CAL PO LIQD
1000.0000 mL | ORAL | Status: DC
  Administered 2021-01-06: 1000 mL
  Filled 2021-01-06 (×3): qty 1000

## 2021-01-06 MED ORDER — DEXTROSE 5 % IV SOLN: INTRAVENOUS | Status: DC

## 2021-01-06 MED ORDER — SENNA 8.6 MG PO TABS
1.0000 | ORAL_TABLET | Freq: Every day | ORAL | Status: DC
  Filled 2021-01-06: qty 1

## 2021-01-06 MED ORDER — FREE WATER
100.0000 mL | Status: DC
  Administered 2021-01-06 – 2021-01-07 (×4): 100 mL

## 2021-01-06 NOTE — Progress Notes (Signed)
Civil engineer, contracting [facility])  Ann Coleman was referred to [facility] originally for hospice services at discharge, however now family an patient would like more prolonging measures. PEG tube will be placed and tube feeding will be started.   Will continue to monitor until discharge. For now looking more like palliative at discharge.   Please feel free to call with any hospice and palliative related questions.  Thank you, [name], BSN, RN [phone]

## 2021-01-06 NOTE — Progress Notes (Signed)
KUB competed and it  confirms that enteric tip projects over the duodenal bulb. Notified [facility] NP, OK to use, removed stylet per protocol.

## 2021-01-06 NOTE — Progress Notes (Signed)
The patient remains without fever or overt abdominal pain.  On exam, the picture is similar to yesterday, where she is sort of slowly squirming around in bed from time to time, but not writhing in pain.  She really does not engage with the examiner and is verbally noncommunicative.  Vital signs are normal, no tachycardia or hypotension, oxygen saturation remains normal.  The abdomen is nontender, and if anything, the abdominal exam is less impressive than it was in the preceding 2 days when I have seen her.  Labs are worse today, with white count rising from 19 5 to 23.4, on Zosyn.  Liver chemistries have roughly doubled over the past 24 hours, although they are still not impressively high.  I spoke with the patient's attending hospitalist.  The patient's daughter has requested ongoing supportive care including nutrition support so a core track tube has been placed and the patient will be receiving tube feeding.   Impression:  1.  Choledocholithiasis with elevated liver chemistries.  2.  Resolving Covid pneumonia with recent probable aspiration pneumonia/superimposed bacterial pneumonia, substantially improved in terms of supplemental oxygen requirements.   Recommendation:  Given the patient's daughters preference for supportive care rather than comfort measures in this elderly patient, and given that her respiratory status is now substantially improved and her liver chemistries are worsening, I think it is time to give serious consideration to moving forward with some sort of biliary drainage procedure.  This does not appear to be emergent, given absence of fever or sepsis.  Dr. Nevada Crane will contact interventional radiology tomorrow and I will ask the physicians covering our GI hospital service to discuss with them whether ERCP or PTC makes more sense, assuming the patient's daughter is still on board with having her mother undergo therapeutic interventions.  Cleotis Nipper, M.D. Pager  [phone] If no answer or after 5 PM call [phone]

## 2021-01-06 NOTE — Progress Notes (Signed)
Initial Nutrition Assessment  RD working remotely.  DOCUMENTATION CODES:   Not applicable  INTERVENTION:  Once NGT placed and confirmed, initiate Osmolite 1.2 Cal at 20 mL/hr and advance by 10 mL/hr every 8 hours to goal rate of 60 mL/hr. Provides 1728 kcal, 80 grams of protein, 1181 mL H2O daily.  Provide minimum free water flush of 30 mL Q4hrs to maintain tube patency as patient is receiving IV fluids.  Monitor magnesium, potassium, and phosphorus daily for at least 3 days, MD to replete as needed, as pt is at risk for refeeding syndrome.  Continue to offer Ensure Enlive po BID, each supplement provides 350 kcal and 20 grams of protein. However, per chart patient refusing PO intake.  NUTRITION DIAGNOSIS:   Increased nutrient needs related to catabolic illness (COVID-19) as evidenced by estimated needs.  GOAL:   Patient will meet greater than or equal to 90% of their needs  MONITOR:   PO intake,Supplement acceptance,Labs,Weight trends,TF tolerance,I & O's  REASON FOR ASSESSMENT:   Consult Assessment of nutrition requirement/status,Enteral/tube feeding initiation and management  ASSESSMENT:   87 year old female with PMHx of HTN, CVA, diastolic dysfunction, HLD, chronic bilateral lower extremity pain admitted with COVID-19 viral PNA and suspected superimposed bacterial PNA, newly diagnosed choledocholithiasis.   Per review of chart patient minimally responsive and will be unable to speak over the phone. Per review of chart patient with poor oral intake. Limited documentation available but since 1/28 PO intake has been 0-20%. It appears patient ate 4 bites of food on 2/4 and on 2/5 refused any PO intake. Patient was medicated for pain as she was moaning and grimacing. Per Palliative Medicine notes patient is at end of life and hospital death is anticipated.   Weights available in chart appear fairly weight-stable. Patient was 64.3 kg on 6/8/2j018, 68.7 kg on 05/30/2020, and is  currently documented to be 69 kg (152.12 lbs). As this weight was from >7 days ago and unknown fluid status at that time, unable to determine if weight may be falsely elevated or patient's true BMI status.  RD received consult for initiation of tube feeds. NGT placement has been ordered. RD concerned that if patient is at end of life insertion of feeding tube and initiation of tube feeds will cause discomfort and prolong suffering. Discussed with MD via secure chat and family has requested full scope of care so that is why plan is for initiation of tube feeds today.  Medications reviewed and include: Solu-Medrol 40 mg Q12hrs IV, Protonix, D5W at 50 mL/hr, Zosyn.  Labs reviewed: Sodium 154, Chloride 115, BUN 55.  Discussed with RN via secure chat.  NUTRITION - FOCUSED PHYSICAL EXAM:  Unable to complete at this time as RD is working remotely.  Diet Order:   Diet Order            DIET SOFT Room service appropriate? Yes; Fluid consistency: Thin  Diet effective now                EDUCATION NEEDS:   No education needs have been identified at this time  Skin:  Skin Assessment: Reviewed RN Assessment  Last BM:  PTA  Height:   Ht Readings from Last 1 Encounters:  12/27/20 4\' 11"  (1.499 m)   Weight:   Wt Readings from Last 1 Encounters:  12/27/20 69 kg   BMI:  Body mass index is 30.72 kg/m.  Estimated Nutritional Needs:   Kcal:  1600-1800  Protein:  80-90 grams  Fluid:  1.6-1.8 L/day  Felix Pacini, MS, RD, LDN Pager number available on Amion

## 2021-01-06 NOTE — Progress Notes (Signed)
PROGRESS NOTE  Ann Coleman:[id number] DOB: [birth year] DOA: 12-29-2020 PCP: Patient, No Pcp Per  HPI/Recap of past 24 hours: Bernardette Chapple is a Cambodian-speaking 87 y.o.femalewith medical history significant ofmultiple episode of CVA, aphasia, dysphagia, essential hypertension, chronic diastolic dysfunction, hyperlipidemia, chronic bilateral lower extremity pain who was having significant weakness and shortness of breath at home.  When EMS arrived she was found to be hypoxic and was brought to the ED for further evaluation.  Work-up revealed COVID-19 viral pneumonia and acute hypoxic respiratory failure.  Hospital course complicated by worsening respiratory failure requiring high flow nasal cannula 15 L and nonrebreather and acute metabolic encephalopathy.  Started on IV antibiotic Zosyn on 01/03/21 due to suspected superimposed bacterial pulmonary infection.  She is currently being weaned off oxygen supplementation down to 5 L with O2 saturation in the mid 90s.  Due to elevated liver chemistries and abdominal tenderness on palpation a CT abdomen and pelvis without contrast (01/04/21) was obtained which revealed choledocholithiasis and common bile duct dilatation for which GI was consulted.    01/06/21: Seen and examined.  Worsening transaminitis.  No significant abdominal pain on exam.  Discussed with GI Dr. Matthias Hughs, will continue to treat conservatively and consult IR in the morning.   Assessment/Plan: Principal Problem:   Pneumonia due to COVID-19 virus Active Problems:   HLD (hyperlipidemia)   Essential hypertension   CKD (chronic kidney disease), stage II   Diastolic dysfunction   Improving acute hypoxemic respiratory failure secondary to COVID-19 viral pneumonia, suspected superimposed bacterial pulmonary infection, possible aspiration POA -In the emergency department, SPO2 70% on room air and required nonrebreather at 15 L, then she required NRB+HFNC.   She is currently being weaned off  oxygen supplementation down to 5 L with O2  saturation in the mid 90s. -CTA negative for PE (12/27/20) -Continue to maintain O2 saturation greater than 90%. Continue Zosyn (01/03/2021>>) day #4  COVID-19 viral pneumonia, POA -Tested positive on 12/29/20 -Completed 5 days of Remdesivir -Continue steroids 1/26 >>  -Continue baricitinib 1/27 >> -Initially on Rocephin and azithromycin due to elevated procalcitonin, initial concern for bacterial infection, completed course. -Restarted IV antibiotics on 01/03/2021, Zosyn.  Newly diagnosed choledocholithiasis with CBD dilatation  Appreciate GIs assistance. Continue current medical management.   On IV Zosyn, continue Pain control in place Repeat CMP AM IR consult in the AM  Acute hypovolemic hypernatremia in the setting of poor oral intake and dehydration Serum sodium rise to 154 from 146 yesterday Started D5W at 50 cc/h, DC once core track feeding is initiated, then will treat with free water flushes Check serum sodium every 6 hours and avoid rapid correction  Dysphagia with concern for aspiration in the setting of history of CVA/Poor oral intake/Malnutrition post Cortrak placement on 01/06/21 Daughter wants full scope of treatment Start feeding once placement is confirmed Continue aspiration precautions Elevate head of bed 35 degrees Continue Zosyn day #4 due to suspected aspiration pneumonia.  Chronic constipation Enema Bowel regimen  Resolved AKI, prerenal in the setting of dehydration from poor oral intake Patient has had poor oral intake Receiving IV fluid hydration, gentle. Renal function is back to baseline, creatinine 0.8 with GFR greater than 60. Continue to avoid nephrotoxins and dehydration  Moderate protein calorie malnutrition Albumin 2.4 Moderate muscle mass loss. Dietitian following.  History of CVA  -Residual dysphagia PT OT to assess once stable. Continue fall and aspiration precautions  Left thyroid  nodule -Recommend thyroid ultrasound for work-up as an outpatient  Goals of care DNR Appreciate palliative care team assistance.  Physical debility/generalized weakness Continue to treat underlying conditions Daughter wants full scope of treatment for now.   DVT prophylaxis:  enoxaparin (LOVENOX) injection 40 mg Start: 12/27/20 1000  Code Status: DNR, confirmed with 2 daughters over     Status is: Inpatient  Dispo: The patient is from: Home.               Anticipated d/c is to: Home with home health services.              Anticipated d/c date is: 01/10/2021.              Patient currently not stable for DC ongoing management of aspiration pneumonia and choledocholithiasis.     Difficult to place patient N/A        Objective: Vitals:   01/06/21 0100 01/06/21 0500 01/06/21 0856 01/06/21 1400  BP: (!) 145/65  (!) 168/82 (!) 171/82  Pulse: 63   80  Resp: 17  18 18   Temp:  99.5 F (37.5 C) (!) 97.4 F (36.3 C) 98 F (36.7 C)  TempSrc:  Axillary Axillary Axillary  SpO2: 93%   91%  Weight:      Height:        Intake/Output Summary (Last 24 hours) at 01/06/2021 1717 Last data filed at 01/06/2021 1500 Gross per 24 hour  Intake 830 ml  Output 550 ml  Net 280 ml   Filed Weights   12/27/20 1159  Weight: 69 kg    Exam:  . General: 87 y.o. year-old female chronically ill-appearing in no acute distress.  Alert and minimally interactive.  Nonverbal. . Cardiovascular: Regular rate and rhythm no rubs or gallops.   92 Respiratory: Mild rales at bases no wheezing noted.  Poor inspiratory effort. . Abdomen: Soft nontender normal bowel sounds present.   . Musculoskeletal: No lower extremity edema bilaterally. . Skin: No ulcerative lesions noted.   Marland Kitchen Psychiatry: Mood is appropriate for condition and setting.  Data Reviewed: CBC: Recent Labs  Lab 01/02/21 0500 01/03/21 0427 01/04/21 0419 01/05/21 0534 01/06/21 0516  WBC 17.2* 22.8* 20.2* 19.5* 23.4*  NEUTROABS  15.6* 21.1* 18.9* 18.1* 21.8*  HGB 11.8* 11.8* 12.0 12.0 11.2*  HCT 35.9* 35.8* 36.6 38.0 35.5*  MCV 79.6* 79.4* 78.9* 83.2 82.8  PLT 371 325 341 263 240   Basic Metabolic Panel: Recent Labs  Lab 01/03/21 0427 01/04/21 0419 01/04/21 1807 01/05/21 0534 01/06/21 0516 01/06/21 1040 01/06/21 1548  NA 142 148* 149* 146* 154* 152* 153*  K 3.7 3.8 3.9 4.6 4.0  --   --   CL 109 111 110 113* 115*  --   --   CO2 24 28 29 23 29   --   --   GLUCOSE 133* 143* 163* 163* 159*  --   --   BUN 43* 52* 56* 60* 55*  --   --   CREATININE 0.85 1.10* 1.14* 1.06* 0.83  --   --   CALCIUM 7.8* 8.0* 7.7* 7.4* 7.7*  --   --    GFR: Estimated Creatinine Clearance: 34.2 mL/min (by C-G formula based on SCr of 0.83 mg/dL). Liver Function Tests: Recent Labs  Lab 01/03/21 0427 01/04/21 0419 01/04/21 1807 01/05/21 0534 01/06/21 0516  AST 77* 65* 86* 86* 165*  ALT 44 47* 49* 50* 88*  ALKPHOS 80 80 92 84 86  BILITOT 1.2 2.0* 1.7* 2.2* 3.8*  PROT 5.9* 6.2* 5.8* 5.4* 5.3*  ALBUMIN 2.3* 2.4* 2.2* 2.0* 2.1*   Recent Labs  Lab 01/04/21 0419  LIPASE 28   No results for input(s): AMMONIA in the last 168 hours. Coagulation Profile: No results for input(s): INR, PROTIME in the last 168 hours. Cardiac Enzymes: No results for input(s): CKTOTAL, CKMB, CKMBINDEX, TROPONINI in the last 168 hours. BNP (last 3 results) No results for input(s): PROBNP in the last 8760 hours. HbA1C: No results for input(s): HGBA1C in the last 72 hours. CBG: Recent Labs  Lab 01/06/21 1602  GLUCAP 158*   Lipid Profile: No results for input(s): CHOL, HDL, LDLCALC, TRIG, CHOLHDL, LDLDIRECT in the last 72 hours. Thyroid Function Tests: No results for input(s): TSH, T4TOTAL, FREET4, T3FREE, THYROIDAB in the last 72 hours. Anemia Panel: No results for input(s): VITAMINB12, FOLATE, FERRITIN, TIBC, IRON, RETICCTPCT in the last 72 hours. Urine analysis:    Component Value Date/Time   COLORURINE STRAW (A) 03/04/2017 1230    APPEARANCEUR CLEAR 03/04/2017 1230   LABSPEC 1.005 03/04/2017 1230   PHURINE 7.0 03/04/2017 1230   GLUCOSEU NEGATIVE 03/04/2017 1230   HGBUR NEGATIVE 03/04/2017 1230   BILIRUBINUR NEGATIVE 03/04/2017 1230   KETONESUR NEGATIVE 03/04/2017 1230   PROTEINUR NEGATIVE 03/04/2017 1230   NITRITE NEGATIVE 03/04/2017 1230   LEUKOCYTESUR NEGATIVE 03/04/2017 1230   Sepsis Labs: @LABRCNTIP (procalcitonin:4,lacticidven:4)  ) No results found for this or any previous visit (from the past 240 hour(s)).    Studies: DG Abd 1 View  Result Date: 01/06/2021 CLINICAL DATA:  NG tube placement. EXAM: ABDOMEN - 1 VIEW COMPARISON:  CT of the abdomen pelvis March 04, 2021 FINDINGS: Feeding catheter tip at the level of the expected location the GE junction. Nonobstructive bowel gas pattern. Patchy airspace consolidation in bilateral lung bases. IMPRESSION: 1. Feeding catheter tip at the level of the GE junction. 2. Patchy airspace consolidation in bilateral lung bases. Electronically Signed   By: March 06, 2021 M.D.   On: 01/06/2021 12:18    Scheduled Meds: . Chlorhexidine Gluconate Cloth  6 each Topical Daily  . enoxaparin (LOVENOX) injection  40 mg Subcutaneous Q24H  . feeding supplement  237 mL Oral BID BM  . methylPREDNISolone (SOLU-MEDROL) injection  40 mg Intravenous Q12H  . metoprolol tartrate  5 mg Intravenous Q6H  . pantoprazole (PROTONIX) IV  40 mg Intravenous Q12H    Continuous Infusions: . sodium chloride    . dextrose 50 mL/hr at 01/06/21 0726  . feeding supplement (OSMOLITE 1.2 CAL)    . piperacillin-tazobactam (ZOSYN)  IV 3.375 g (01/06/21 1601)     LOS: 11 days     03/06/21, MD [facility] Pager [phone]  If 7PM-7AM, please contact night-coverage [email or web address] Password [facility] 01/06/2021, 5:17 PM

## 2021-01-06 NOTE — Progress Notes (Signed)
PT Cancellation Note  Patient Details Name: Ann Coleman MRN: [id number] DOB: [birth year]   Cancelled Treatment:    Reason Eval/Treat Not Completed: Other (comment);Medical issues which prohibited therapy. Per MD pt not stable or appropriate for PT at this tim.    [facility] 01/06/2021, 12:56 PM

## 2021-01-07 ENCOUNTER — Inpatient Hospital Stay (HOSPITAL_COMMUNITY): Payer: Medicare Other

## 2021-01-07 DIAGNOSIS — U071 COVID-19: Secondary | ICD-10-CM | POA: Diagnosis not present

## 2021-01-07 DIAGNOSIS — J1282 Pneumonia due to coronavirus disease 2019: Secondary | ICD-10-CM | POA: Diagnosis not present

## 2021-01-07 LAB — COMPREHENSIVE METABOLIC PANEL
ALT: 94 U/L — ABNORMAL HIGH (ref 0–44)
AST: 100 U/L — ABNORMAL HIGH (ref 15–41)
Albumin: 2.1 g/dL — ABNORMAL LOW (ref 3.5–5.0)
Alkaline Phosphatase: 87 U/L (ref 38–126)
Anion gap: 10 (ref 5–15)
BUN: 51 mg/dL — ABNORMAL HIGH (ref 8–23)
CO2: 30 mmol/L (ref 22–32)
Calcium: 7.7 mg/dL — ABNORMAL LOW (ref 8.9–10.3)
Chloride: 109 mmol/L (ref 98–111)
Creatinine, Ser: 0.94 mg/dL (ref 0.44–1.00)
GFR, Estimated: 56 mL/min — ABNORMAL LOW (ref >60)
Glucose, Bld: 203 mg/dL — ABNORMAL HIGH (ref 70–99)
Potassium: 4 mmol/L (ref 3.5–5.1)
Sodium: 149 mmol/L — ABNORMAL HIGH (ref 135–145)
Total Bilirubin: 3 mg/dL — ABNORMAL HIGH (ref 0.3–1.2)
Total Protein: 5.3 g/dL — ABNORMAL LOW (ref 6.5–8.1)

## 2021-01-07 LAB — D-DIMER, QUANTITATIVE: D-Dimer, Quant: 7.25 ug/mL-FEU — ABNORMAL HIGH (ref 0.00–0.50)

## 2021-01-07 LAB — HEMOGLOBIN A1C
Hgb A1c MFr Bld: 6.2 % — ABNORMAL HIGH (ref 4.8–5.6)
Mean Plasma Glucose: 131.24 mg/dL

## 2021-01-07 LAB — CBC
HCT: 36.6 % (ref 36.0–46.0)
HCT: 38.5 % (ref 36.0–46.0)
Hemoglobin: 11.5 g/dL — ABNORMAL LOW (ref 12.0–15.0)
Hemoglobin: 12.3 g/dL (ref 12.0–15.0)
MCH: 26.1 pg (ref 26.0–34.0)
MCH: 26.5 pg (ref 26.0–34.0)
MCHC: 31.4 g/dL (ref 30.0–36.0)
MCHC: 31.9 g/dL (ref 30.0–36.0)
MCV: 83.2 fL (ref 80.0–100.0)
MCV: 82.8 fL (ref 80.0–100.0)
Platelets: 194 10*3/uL (ref 150–400)
Platelets: 216 10*3/uL (ref 150–400)
RBC: 4.4 MIL/uL (ref 3.87–5.11)
RBC: 4.65 MIL/uL (ref 3.87–5.11)
RDW: 15.4 % (ref 11.5–15.5)
RDW: 15.4 % (ref 11.5–15.5)
WBC: 31.5 10*3/uL — ABNORMAL HIGH (ref 4.0–10.5)
WBC: 39.5 10*3/uL — ABNORMAL HIGH (ref 4.0–10.5)
nRBC: 0 % (ref 0.0–0.2)
nRBC: 0 % (ref 0.0–0.2)

## 2021-01-07 LAB — MAGNESIUM: Magnesium: 2.8 mg/dL — ABNORMAL HIGH (ref 1.7–2.4)

## 2021-01-07 LAB — MRSA PCR SCREENING: MRSA by PCR: NEGATIVE

## 2021-01-07 LAB — PHOSPHORUS: Phosphorus: 2.8 mg/dL (ref 2.5–4.6)

## 2021-01-07 LAB — GLUCOSE, CAPILLARY
Glucose-Capillary: 178 mg/dL — ABNORMAL HIGH (ref 70–99)
Glucose-Capillary: 187 mg/dL — ABNORMAL HIGH (ref 70–99)
Glucose-Capillary: 198 mg/dL — ABNORMAL HIGH (ref 70–99)
Glucose-Capillary: 187 mg/dL — ABNORMAL HIGH (ref 70–99)

## 2021-01-07 LAB — C-REACTIVE PROTEIN: CRP: 1.3 mg/dL — ABNORMAL HIGH (ref <1.0)

## 2021-01-07 MED ORDER — FREE WATER: 200.0000 mL | Status: DC

## 2021-01-07 MED ORDER — FREE WATER
150.0000 mL | Status: DC
  Administered 2021-01-07: 150 mL

## 2021-01-07 MED ORDER — INSULIN ASPART 100 UNIT/ML ~~LOC~~ SOLN
0.0000 [IU] | SUBCUTANEOUS | Status: DC
  Administered 2021-01-07: 2 [IU] via SUBCUTANEOUS

## 2021-01-07 MED ORDER — LORAZEPAM 2 MG/ML IJ SOLN
0.5000 mg | INTRAMUSCULAR | Status: DC | PRN
  Administered 2021-01-07: 0.5 mg via INTRAVENOUS
  Filled 2021-01-07: qty 1

## 2021-01-07 MED ORDER — AMLODIPINE BESYLATE 5 MG PO TABS
2.5000 mg | ORAL_TABLET | Freq: Every day | ORAL | Status: DC
  Administered 2021-01-07: 2.5 mg via NASOGASTRIC
  Filled 2021-01-07: qty 1

## 2021-01-07 MED ORDER — MORPHINE SULFATE (PF) 2 MG/ML IV SOLN
1.0000 mg | INTRAVENOUS | Status: DC | PRN
  Administered 2021-01-07 – 2021-01-08 (×3): 1 mg via INTRAVENOUS
  Filled 2021-01-07 (×3): qty 1

## 2021-01-07 MED ORDER — FUROSEMIDE 10 MG/ML IJ SOLN
20.0000 mg | Freq: Once | INTRAMUSCULAR | Status: AC
  Administered 2021-01-07: 20 mg via INTRAVENOUS
  Filled 2021-01-07: qty 2

## 2021-01-07 NOTE — Progress Notes (Signed)
Went to see patient, several nurses in room working with her for hypoxia sats in 60-70%.  Patient's LFTs are improving.  Patient is in no shape, in my opinion, for any kind of GI intervention (whether PTC or ERCP).  Any attempts at any type of anesthesia (moderate sedation versus general anesthesia) carry prohibitive risks of complications.  Would continue antibiotics and supportive care.  If LFTs worsen, and her oxygen requirements improve, we can reconsider this approach; otherwise, would pursue medical management.  If she ultimately improves to the point of needing an intervention, I would favor PTC over ERCP.  [facility] will follow along at a distance.  Case discussed directly with Dr. Nevada Crane of [facility].

## 2021-01-07 NOTE — TOC Progression Note (Signed)
Transition of Care [facility]) - Progression Note    Patient Details  Name: Ann Coleman MRN: [id number] Date of Birth: [birth year]  [facility] CM/SW Contact  Geni Bers, RN Phone Number: 01/07/2021, 1:28 PM  Clinical Narrative:    Spoke with pt's [name] concerning discharge plans. [name] states that she will take mother/pt home with Hospice. TOC will continue to follow.    Expected Discharge Plan: Home/Self Care    Expected Discharge Plan and Services Expected Discharge Plan: Home/Self Care                                               Social Determinants of Health (SDOH) Interventions    Readmission Risk Interventions No flowsheet data found.

## 2021-01-07 NOTE — Progress Notes (Signed)
PROGRESS NOTE  Ann Coleman RCB:[id number] DOB: 1924-11-06 DOA: 12/27/2020 PCP: Patient, No Pcp Per  HPI/Recap of past 24 hours: Ann Coleman is a Cambodian-speaking 87 y.o.femalewith medical history significant ofmultiple episode of CVA, aphasia, dysphagia, essential hypertension, chronic diastolic dysfunction, hyperlipidemia, chronic bilateral lower extremity pain who was having significant weakness and shortness of breath at home.  When EMS arrived she was found to be hypoxic and was brought to the ED for further evaluation.  Work-up revealed COVID-19 viral pneumonia and acute hypoxic respiratory failure.  Hospital course complicated by worsening respiratory failure requiring high flow nasal cannula 15 L and nonrebreather and acute metabolic encephalopathy.  Started on IV antibiotic Zosyn on 01/03/21 due to suspected superimposed bacterial pulmonary infection.  She is currently being weaned off oxygen supplementation down to 5 L with O2 saturation in the mid 90s.  Due to elevated liver chemistries and abdominal tenderness on palpation a CT abdomen and pelvis without contrast (01/04/21) was obtained which revealed choledocholithiasis and common bile duct dilatation for which GI was consulted.  No plan for PTC or ERCP, not a good candidate, risks outweigh the benefits.  GI recommended to continue medical management.  01/07/21: Seen and examined at her bedside.  She is somnolent but arousable.  Minimally interactive.  Assessment/Plan: Principal Problem:   Pneumonia due to COVID-19 virus Active Problems:   HLD (hyperlipidemia)   Essential hypertension   CKD (chronic kidney disease), stage II   Diastolic dysfunction   Improving acute hypoxemic respiratory failure secondary to COVID-19 viral pneumonia, suspected superimposed bacterial pulmonary infection, possible aspiration POA -In the emergency department, SPO2 70% on room air and required nonrebreather at 15 L, then she required NRB+HFNC.   She is  currently being weaned off oxygen supplementation down to 5 L with O2  saturation in the mid 90s. -CTA negative for PE (12/27/20) -Continue to maintain O2 saturation greater than 90%. Continue Zosyn (01/03/2021>>) day #5  COVID-19 viral pneumonia, POA -Tested positive on 12/25/2020 -Completed 5 days of Remdesivir -Continue steroids 1/26 >>  -Continue baricitinib 1/27 >> -Initially on Rocephin and azithromycin due to elevated procalcitonin, initial concern for bacterial infection, completed course. -Restarted IV antibiotics on 01/03/2021, Zosyn. Elevated D-dimer, downtrending  Newly diagnosed choledocholithiasis with CBD dilatation Acute transaminitis due to the above Seen by GI, no planned procedure at this time due to poor candidate. Continue IV Zosyn for now. Continue to monitor LFTs  Improving acute hypovolemic hypernatremia in the setting of poor oral intake and dehydration Serum sodium 149 from 154 Currently, IV fluid Hydration via cortrak tube feeding  Dysphagia with concern for aspiration in the setting of history of CVA/Poor oral intake/Malnutrition post Cortrak placement on 01/06/21 Will need to monitor closely for refeeding syndrome Daughter wants full scope of treatment Continue aspiration precautions Elevate head of bed 35 degrees Continue Zosyn day #5 due to suspected aspiration pneumonia.  Chronic constipation Enema Bowel regimen  Resolved AKI, prerenal in the setting of dehydration from poor oral intake Patient has had poor oral intake Renal function is back to baseline, creatinine 0.8 with GFR greater than 60. Continue to avoid nephrotoxins and dehydration  Moderate protein calorie malnutrition Albumin 2.4 Moderate muscle mass loss. Dietitian following.  History of CVA  -Residual dysphagia PT OT to assess once stable. Continue fall and aspiration precautions  Left thyroid nodule -Recommend thyroid ultrasound for work-up as an outpatient  Goals of  care DNR Appreciate palliative care team assistance.  Physical debility/generalized weakness Continue to treat underlying  conditions Daughter wants full scope of treatment for now.   DVT prophylaxis:  enoxaparin (LOVENOX) injection 40 mg Start: 12/27/20 1000  Code Status: DNR, confirmed with 2 daughters over     Status is: Inpatient  Dispo: The patient is from: Home.               Anticipated d/c is to: Home with home health services.              Anticipated d/c date is: 01/11/2021.              Patient currently not stable for DC ongoing management of aspiration pneumonia and choledocholithiasis.     Difficult to place patient N/A        Objective: Vitals:   01/07/21 0500 01/07/21 0600 01/07/21 1228 01/07/21 1304  BP:  (!) 141/68 118/83   Pulse:  94 (!) 118   Resp:  18 (!) 22   Temp:      TempSrc:      SpO2:  93%  96%  Weight: 57.7 kg     Height:        Intake/Output Summary (Last 24 hours) at 01/07/2021 1410 Last data filed at 01/07/2021 0300 Gross per 24 hour  Intake 702.17 ml  Output 550 ml  Net 152.17 ml   Filed Weights   12/27/20 1159 01/07/21 0500  Weight: 69 kg 57.7 kg    Exam:  . General: 87 y.o. year-old female chronically ill-appearing no acute distress.  Somnolent but arousable.   . Cardiovascular: Regular rate and rhythm no rubs or gallops.   Marland Kitchen Respiratory: Mild rales at bases no wheezing noted. . Abdomen: Soft bowel sounds present. . Musculoskeletal: No lower extremity edema bilaterally.   . Skin: No ulcerative lesions noted. Marland Kitchen Psychiatry: Mood is appropriate for condition and setting.  Data Reviewed: CBC: Recent Labs  Lab 01/02/21 0500 01/03/21 0427 01/04/21 0419 01/05/21 0534 01/06/21 0516 01/07/21 0412  WBC 17.2* 22.8* 20.2* 19.5* 23.4* 31.5*  NEUTROABS 15.6* 21.1* 18.9* 18.1* 21.8*  --   HGB 11.8* 11.8* 12.0 12.0 11.2* 11.5*  HCT 35.9* 35.8* 36.6 38.0 35.5* 36.6  MCV 79.6* 79.4* 78.9* 83.2 82.8 83.2  PLT 371 325 341 263  240 132   Basic Metabolic Panel: Recent Labs  Lab 01/04/21 0419 01/04/21 1807 01/05/21 0534 01/06/21 0516 01/06/21 1040 01/06/21 1548 01/06/21 2139 01/07/21 0412  NA 148* 149* 146* 154* 152* 153* 152* 149*  K 3.8 3.9 4.6 4.0  --   --   --  4.0  CL 111 110 113* 115*  --   --   --  109  CO2 '28 29 23 29  ' --   --   --  30  GLUCOSE 143* 163* 163* 159*  --   --   --  203*  BUN 52* 56* 60* 55*  --   --   --  51*  CREATININE 1.10* 1.14* 1.06* 0.83  --   --   --  0.94  CALCIUM 8.0* 7.7* 7.4* 7.7*  --   --   --  7.7*  MG  --   --   --   --   --   --   --  2.8*  PHOS  --   --   --   --   --   --   --  2.8   GFR: Estimated Creatinine Clearance: 27.7 mL/min (by C-G formula based on SCr of 0.94 mg/dL). Liver Function Tests:  Recent Labs  Lab 01/04/21 0419 01/04/21 1807 01/05/21 0534 01/06/21 0516 01/07/21 0412  AST 65* 86* 86* 165* 100*  ALT 47* 49* 50* 88* 94*  ALKPHOS 80 92 84 86 87  BILITOT 2.0* 1.7* 2.2* 3.8* 3.0*  PROT 6.2* 5.8* 5.4* 5.3* 5.3*  ALBUMIN 2.4* 2.2* 2.0* 2.1* 2.1*   Recent Labs  Lab 01/04/21 0419  LIPASE 28   No results for input(s): AMMONIA in the last 168 hours. Coagulation Profile: No results for input(s): INR, PROTIME in the last 168 hours. Cardiac Enzymes: No results for input(s): CKTOTAL, CKMB, CKMBINDEX, TROPONINI in the last 168 hours. BNP (last 3 results) No results for input(s): PROBNP in the last 8760 hours. HbA1C: No results for input(s): HGBA1C in the last 72 hours. CBG: Recent Labs  Lab 01/06/21 1958 01/06/21 2319 01/07/21 0350 01/07/21 0813 01/07/21 1217  GLUCAP 148* 140* 178* 187* 198*   Lipid Profile: No results for input(s): CHOL, HDL, LDLCALC, TRIG, CHOLHDL, LDLDIRECT in the last 72 hours. Thyroid Function Tests: No results for input(s): TSH, T4TOTAL, FREET4, T3FREE, THYROIDAB in the last 72 hours. Anemia Panel: No results for input(s): VITAMINB12, FOLATE, FERRITIN, TIBC, IRON, RETICCTPCT in the last 72 hours. Urine  analysis:    Component Value Date/Time   COLORURINE STRAW (A) 03/04/2017 1230   APPEARANCEUR CLEAR 03/04/2017 1230   LABSPEC 1.005 03/04/2017 1230   PHURINE 7.0 03/04/2017 1230   GLUCOSEU NEGATIVE 03/04/2017 1230   HGBUR NEGATIVE 03/04/2017 1230   BILIRUBINUR NEGATIVE 03/04/2017 1230   KETONESUR NEGATIVE 03/04/2017 1230   PROTEINUR NEGATIVE 03/04/2017 1230   NITRITE NEGATIVE 03/04/2017 1230   LEUKOCYTESUR NEGATIVE 03/04/2017 1230   Sepsis Labs: '@LABRCNTIP' (procalcitonin:4,lacticidven:4)  ) Recent Results (from the past 240 hour(s))  MRSA PCR Screening     Status: None   Collection Time: 01/07/21  8:33 AM   Specimen: Nasal Mucosa; Nasopharyngeal  Result Value Ref Range Status   MRSA by PCR NEGATIVE NEGATIVE Final    Comment:        The GeneXpert MRSA Assay (FDA approved for NASAL specimens only), is one component of a comprehensive MRSA colonization surveillance program. It is not intended to diagnose MRSA infection nor to guide or monitor treatment for MRSA infections. Performed at [facility], [place] [address] [place], [place] [address]       Studies: DG Abd 1 View  Result Date: 01/06/2021 CLINICAL DATA:  Tube placement EXAM: ABDOMEN - 1 VIEW COMPARISON:  01/06/2021 FINDINGS: The enteric tube projects over the expected region of the duodenal bulb. The tip is pointed distally. The visualized bowel gas pattern is nonspecific. Airspace opacities are noted projecting over the patient's lower lung fields. IMPRESSION: Enteric tube tip projects over the duodenal bulb. Electronically Signed   By: Constance Holster M.D.   On: 01/06/2021 21:09   DG Abd Portable 1V  Result Date: 01/06/2021 CLINICAL DATA:  Re-evaluate NG tube position. EXAM: PORTABLE ABDOMEN - 1 VIEW COMPARISON:  Same day abdominal radiograph FINDINGS: Weighted enteric feeding catheter with tip in the expected location of the distal esophagus/proximal stomach. Nonobstructive bowel gas pattern.  Similar bibasilar patchy airspace consolidations. IMPRESSION: Weighted enteric feeding catheter with tip in the expected location of the distal esophagus/proximal stomach. Electronically Signed   By: Dahlia Bailiff MD   On: 01/06/2021 17:58    Scheduled Meds: . amLODipine  2.5 mg Per NG tube Daily  . Chlorhexidine Gluconate Cloth  6 each Topical Daily  . enoxaparin (LOVENOX) injection  40 mg  Subcutaneous Q24H  . feeding supplement  237 mL Oral BID BM  . free water  100 mL Per Tube Q4H  . insulin aspart  0-9 Units Subcutaneous Q4H  . methylPREDNISolone (SOLU-MEDROL) injection  40 mg Intravenous Q12H  . pantoprazole (PROTONIX) IV  40 mg Intravenous Q12H  . senna  1 tablet Oral QHS    Continuous Infusions: . sodium chloride    . feeding supplement (OSMOLITE 1.2 CAL) 30 mL/hr at 01/07/21 0659  . piperacillin-tazobactam (ZOSYN)  IV 3.375 g (01/07/21 0823)     LOS: 12 days     Kayleen Memos, MD [facility] Pager [phone]  If 7PM-7AM, please contact night-coverage [email or web address] Password [email or web address] 01/07/2021, 2:10 PM

## 2021-01-07 NOTE — Progress Notes (Signed)
PT Cancellation Note  Patient Details Name: Ann Coleman MRN: [id number] DOB: 06-04-1924   Cancelled Treatment:    Reason Eval/Treat Not Completed: PT screened, no needs identified, will sign off RN reports pt not medically appropriate and also not able to follow commands.  Pt does not appear appropriate for acute skilled PT services. Plan is for home with hospice or palliative care.   Please only reorder if pt is able to participate.    [name],[name] 01/07/2021, 11:50 AM Thomasene Mohair PT, DPT Acute Rehabilitation Services Pager: [phone] Office: [phone]

## 2021-01-07 NOTE — Progress Notes (Signed)
Called to pt room due to desat/increased O2 need.  Pt on 15L [place] nasal cannula and partial rebreather mask.  Weaned off of nasal cannula to  Partial rebreather mask.  Sats 96-97%.  Attempted to wean to 55% venti mask but sats remained in mid 80's.  Currently on partial rebreather with Sats in upper 90's. RT will continue to monitor and wean as tolerated.

## 2021-01-07 NOTE — Plan of Care (Signed)
  Problem: Education: Goal: Knowledge of risk factors and measures for prevention of condition will improve Outcome: Progressing   Problem: Coping: Goal: Psychosocial and spiritual needs will be supported Outcome: Progressing   Problem: Respiratory: Goal: Will maintain a patent airway Outcome: Progressing Goal: Complications related to the disease process, condition or treatment will be avoided or minimized Outcome: Progressing   Problem: Education: Goal: Knowledge of General Education information will improve Description: Including pain rating scale, medication(s)/side effects and non-pharmacologic comfort measures Outcome: Progressing   Problem: Health Behavior/Discharge Planning: Goal: Ability to manage health-related needs will improve Outcome: Progressing   Problem: Clinical Measurements: Goal: Ability to maintain clinical measurements within normal limits will improve Outcome: Progressing Goal: Will remain free from infection Outcome: Progressing Goal: Diagnostic test results will improve Outcome: Progressing Goal: Respiratory complications will improve Outcome: Progressing Goal: Cardiovascular complication will be avoided Outcome: Progressing   Problem: Activity: Goal: Risk for activity intolerance will decrease Outcome: Progressing   Problem: Nutrition: Goal: Adequate nutrition will be maintained Outcome: Progressing   Problem: Coping: Goal: Level of anxiety will decrease Outcome: Progressing   Problem: Elimination: Goal: Will not experience complications related to bowel motility Outcome: Progressing   Problem: Pain Managment: Goal: General experience of comfort will improve Outcome: Progressing   Problem: Safety: Goal: Ability to remain free from injury will improve Outcome: Progressing   Problem: Skin Integrity: Goal: Risk for impaired skin integrity will decrease Outcome: Progressing

## 2021-01-07 NOTE — Progress Notes (Signed)
Patient's daughter [name] has made decision for comfort care only.  Requested to stop tube feeding and antibiotics.  To continue to maintain respiratory status until family gets there.  Requested bedside RN to allow for visitation at end of life.

## 2021-01-07 NOTE — Progress Notes (Signed)
OT Cancellation Note  Patient Details Name: Ann Coleman MRN: [id number] DOB: 1924/03/02   Cancelled Treatment:    Reason Eval/Treat Not Completed: Medical issues which prohibited therapy. Spoke with RN who ask to hold therapy. Will check back at later time to see if patient appropriate for therapy.  Marlyce Huge OT OT pager: [phone]   Carmelia Roller 01/07/2021, 9:39 AM

## 2021-01-07 NOTE — Progress Notes (Signed)
   01/07/21 0400  Provider Notification  Provider Name/Title E. Anna Genre NP  Date Provider Notified 01/07/21  Time Provider Notified [phone]  Notification Type Page  Notification Reason Other (Comment) (CBG over order parameters)  Response See new orders;Other (Comment) (rate change of 5% dextrose IV fluids)  Date of Provider Response 01/07/21   Blood glucose = 178 Dextrose 5% solution rate change from 30 mL/hr to 10 mL/hr  Caprice Red, RN

## 2021-01-07 NOTE — Care Management Important Message (Addendum)
Important Message  Patient Details Trinkle Letter placed in Patient's door Caddy. Name: Ann Coleman MRN: [id number] Date of Birth: [birth year]   Medicare Important Message Given:  Yes     Caren Macadam 01/07/2021, 1:39 PM

## 2021-01-07 NOTE — Progress Notes (Signed)
Spoke to daughter Phillips Hay regarding comfort care and at this time she wants comfort care.  She asked about visitation, talked to charge nurse and only 2 visitors at a time and this was conveyed to [name] as well.

## 2021-01-11 DIAGNOSIS — K8051 Calculus of bile duct without cholangitis or cholecystitis with obstruction: Secondary | ICD-10-CM | POA: Diagnosis present

## 2021-01-11 DIAGNOSIS — R7401 Elevation of levels of liver transaminase levels: Secondary | ICD-10-CM | POA: Diagnosis not present

## 2021-01-11 DIAGNOSIS — R6251 Failure to thrive (child): Secondary | ICD-10-CM | POA: Diagnosis present

## 2021-01-11 DIAGNOSIS — I69320 Aphasia following cerebral infarction: Secondary | ICD-10-CM

## 2021-01-11 DIAGNOSIS — I5032 Chronic diastolic (congestive) heart failure: Secondary | ICD-10-CM | POA: Diagnosis present

## 2021-01-11 DIAGNOSIS — E46 Unspecified protein-calorie malnutrition: Secondary | ICD-10-CM | POA: Diagnosis present

## 2021-01-11 DIAGNOSIS — Z515 Encounter for palliative care: Secondary | ICD-10-CM

## 2021-01-11 DIAGNOSIS — J9601 Acute respiratory failure with hypoxia: Secondary | ICD-10-CM | POA: Diagnosis present

## 2021-01-11 DIAGNOSIS — Z66 Do not resuscitate: Secondary | ICD-10-CM | POA: Diagnosis present

## 2021-01-11 DIAGNOSIS — J189 Pneumonia, unspecified organism: Secondary | ICD-10-CM | POA: Diagnosis present

## 2021-01-11 DIAGNOSIS — R1310 Dysphagia, unspecified: Secondary | ICD-10-CM

## 2021-01-11 DIAGNOSIS — J9602 Acute respiratory failure with hypercapnia: Secondary | ICD-10-CM | POA: Diagnosis present

## 2021-01-11 DIAGNOSIS — I10 Essential (primary) hypertension: Secondary | ICD-10-CM | POA: Diagnosis present

## 2021-01-11 DIAGNOSIS — K838 Other specified diseases of biliary tract: Secondary | ICD-10-CM | POA: Diagnosis present

## 2021-01-29 NOTE — Progress Notes (Signed)
Ann Coleman from transport at bedside, patient taken to morgue

## 2021-01-29 NOTE — Progress Notes (Signed)
Patients heart rate and o2 sat dropped and daughter out to nursing station stated "I think she is gone" at same time [facility] called and stated she wasn't picking up.  This nurse to room with secondary verifier (RN) at bedside.  No pulse and no respirations.  Patient pronounced deceased @ 41.  Notified on call provider via Amion as well as charge nurse of death.  Post-mortem care provided and patient placed in body bag per protocol.  Patient placement notified by charge nurse.

## 2021-01-29 NOTE — Discharge Summary (Addendum)
Death Summary  Ann Coleman JXB:[id number] DOB: 03/15/1924 DOA: 12/29/2020  PCP: Patient, No Pcp Per  Admit date: 12/30/2020 Date of Death: Dec 06, 2020 Time of Death: 0138 AM Notification: Patient, No Pcp Per notified of death of Dec 06, 2020  History of present illness:  Ann Coleman is a 87 y.o. female, Cambodian-speaking, with medical history significant formultiple strokes, expressive aphasia, dysphagia, essential hypertension, chronic diastolic dysfunction, hyperlipidemia, status post cholecystectomy, prediabetes, chronic bilateral knee pain who [facility] from home via EMS due to generalized weakness and shortness of breath of 3 days duration.  When EMS arrived to her home, she was found to be severely hypoxic with O2 saturation 78% on room air.  She was placed on non-rebreather and brought to the ED for further evaluation and management.  Work-up revealed COVID-19 viral pneumonia, superimposed bacterial pneumonia, and severe acute hypoxic respiratory failure requiring high flow nasal cannula 15 L and nonrebreather.  She received Covid-19 directed therapies and IV antibiotics.  Hospital course complicated by persistent acute metabolic encephalopathy, acute hypoxemia, choledocholithiasis with biliary ductal dilatation, elevated liver chemistries, poor oral intake post dobbhoff tube placement, sepsis multifactorial secondary to covid-19 viral/superimposed bacterial pneumonia and possible acute cholangitis.    MRSA screen was negative.  Patient was on broad spectrum IV antibiotics, IV Zosyn, with no meaningful improvement.  On 01/07/21, family made decision for comfort care only.  Comfort care measures were applied.    On Dec 06, 2020, the patient expired.  Family was present.   Time of death: 0138 AM.   She was pronounced dead by 2 RNs, including her bedside RN Betsey HolidayWarren, Robin.   Final Diagnoses:  1.   Cardiopulmonary arrest.  Principal Problem: Pneumonia due to COVID-19 virus Superimposed bacterial  community acquired    Pneumonia Active Problems:   HLD (hyperlipidemia)   Essential hypertension   CKD (chronic kidney disease), stage II   Diastolic dysfunction   CVA, old, aphasia   Dysphagia   Accelerated hypertension   Chronic diastolic CHF (congestive heart failure) (HCC)   Acute respiratory failure with hypoxia and hypercapnia (HCC)   Choledocholithiasis with obstruction   Dilated cbd, acquired   Transaminitis   Malnutrition (HCC)   Failure to thrive (child)   Comfort measures only status   DNR (do not resuscitate)   The results of significant diagnostics from this hospitalization (including imaging, microbiology, ancillary and laboratory) are listed below for reference.    Significant Diagnostic Studies: CT ABDOMEN PELVIS WO CONTRAST  Addendum Date: 01/04/2021   ADDENDUM REPORT: 01/04/2021 16:36 ADDENDUM: These results were called by telephone at the time of interpretation on 01/04/2021 at 4:36 pm to provider [facility] Stancil Deisher , who verbally acknowledged these results. Electronically Signed   By: Meda KlinefelterStephanie  Peacock MD   On: 01/04/2021 16:36   Result Date: 01/04/2021 CLINICAL DATA:  Abdominal pain EXAM: CT ABDOMEN AND PELVIS WITHOUT CONTRAST TECHNIQUE: Multidetector CT imaging of the abdomen and pelvis was performed following the standard protocol without IV contrast. COMPARISON:  December 27, 2020, January second 2005 FINDINGS: Lower chest: Multifocal bilateral peripheral predominant ground-glass and consolidative opacities. Cardiomegaly. Hepatobiliary: There is dilation of the common bile duct. Patient status post cholecystectomy. There is a choledocholithiasis at the level of the ampullary which measures 8 mm. Common bile duct measures approximately 16 mm. Pancreas: There is mild prominence of the pancreatic duct. No peripancreatic fat stranding. Spleen: Unremarkable. Adrenals/Urinary Tract: Adrenal glands are unremarkable. No hydronephrosis. Bladder is decompressed around a Foley catheter.  Stomach/Bowel: No evidence of bowel obstruction.  Diverticulosis. Appendix is normal. Vascular/Lymphatic: Scattered atherosclerotic calcifications of the aorta. No suspicious lymphadenopathy. Reproductive: Uterus is present. Other: There is a small RIGHT retroperitoneal hematoma. There is fluid tracking along the liver and anterior to the IVC. Musculoskeletal: No acute osseous abnormality. IMPRESSION: 1. Small RIGHT retroperitoneal hematoma. 2. There is an 8 mm choledocholithiasis at the level of the ampulla with upstream dilation of the common bile duct. 3. Multifocal bilateral peripheral predominant ground-glass and consolidative opacities consistent with the sequela of COVID-19 infection. Aortic Atherosclerosis (ICD10-I70.0). PRA system was activated at 4:14 p.m. on 01/04/2021. Addendum will be issued upon telephone delivery of results. Electronically Signed: By: Meda Klinefelter MD On: 01/04/2021 16:19   DG Abd 1 View  Result Date: 01/06/2021 CLINICAL DATA:  Tube placement EXAM: ABDOMEN - 1 VIEW COMPARISON:  01/06/2021 FINDINGS: The enteric tube projects over the expected region of the duodenal bulb. The tip is pointed distally. The visualized bowel gas pattern is nonspecific. Airspace opacities are noted projecting over the patient's lower lung fields. IMPRESSION: Enteric tube tip projects over the duodenal bulb. Electronically Signed   By: Katherine Mantle M.D.   On: 01/06/2021 21:09   DG Abd 1 View  Result Date: 01/06/2021 CLINICAL DATA:  NG tube placement. EXAM: ABDOMEN - 1 VIEW COMPARISON:  CT of the abdomen pelvis March 04, 2021 FINDINGS: Feeding catheter tip at the level of the expected location the GE junction. Nonobstructive bowel gas pattern. Patchy airspace consolidation in bilateral lung bases. IMPRESSION: 1. Feeding catheter tip at the level of the GE junction. 2. Patchy airspace consolidation in bilateral lung bases. Electronically Signed   By: Ted Mcalpine M.D.   On: 01/06/2021  12:18   CT Angio Chest PE W/Cm &/Or Wo Cm  Result Date: 12/27/2020 CLINICAL DATA:  Positive D-dimer. Shortness of breath and weakness for 1 day. Positive COVID. Pulmonary embolus is suspected with low to intermediate probability. EXAM: CT ANGIOGRAPHY CHEST WITH CONTRAST TECHNIQUE: Multidetector CT imaging of the chest was performed using the standard protocol during bolus administration of intravenous contrast. Multiplanar CT image reconstructions and MIPs were obtained to evaluate the vascular anatomy. CONTRAST:  75mL OMNIPAQUE IOHEXOL 350 MG/ML SOLN COMPARISON:  Chest radiograph 12/17/2020 FINDINGS: Cardiovascular: Motion artifact limits the examination. Good opacification of the central and segmental pulmonary arteries. No focal filling defects. No evidence of significant pulmonary embolus. Cardiac enlargement. No pericardial effusions. Coronary artery and aortic calcifications. Mediastinum/Nodes: Esophagus is decompressed. No significant lymphadenopathy in the chest. Peripherally calcified nodule in the left thyroid gland measuring 2.3 cm diameter. Lungs/Pleura: Patchy airspace parenchymal infiltrates in the lungs compatible with COVID pneumonia. No pleural effusions. Suggestion of secretions in the right mainstem bronchus. Upper Abdomen: Reflux of contrast material into the hepatic veins may indicate right heart failure. Surgical absence of the gallbladder. Musculoskeletal: Degenerative changes in the spine. No destructive bone lesions. Review of the MIP images confirms the above findings. IMPRESSION: 1. No evidence of significant pulmonary embolus. 2. Cardiac enlargement with reflux of contrast material into the hepatic veins may indicate right heart failure. 3. Patchy airspace parenchymal infiltrates in the lungs compatible with COVID pneumonia. 4. Peripherally calcified nodule in the left thyroid gland measuring 2.3 cm diameter. Recommend thyroid US (ref: J Am Coll Radiol. 2015 Feb;12(2): 143-50). 5.  Aortic atherosclerosis and coronary artery calcifications. 6. Suggestion of secretions in the right mainstem bronchus. Aortic Atherosclerosis (ICD10-I70.0). Electronically Signed   By: Burman Nieves M.D.   On: 12/27/2020 00:40   DG CHEST PORT 1  VIEW  Result Date: 01/07/2021 CLINICAL DATA:  History of COVID-19 infection EXAM: PORTABLE CHEST 1 VIEW COMPARISON:  01/03/2021 FINDINGS: Cardiac shadow is stable. Tortuous thoracic aorta is again seen. Feeding catheter is noted extending into the stomach. Patchy airspace opacity is again identified and relatively stable from the prior study. No new focal abnormality is noted. IMPRESSION: Stable appearance of the chest no new focal abnormality is seen. Electronically Signed   By: Alcide Clever M.D.   On: 01/07/2021 14:34   DG CHEST PORT 1 VIEW  Result Date: 01/03/2021 CLINICAL DATA:  Acute respiratory failure EXAM: PORTABLE CHEST 1 VIEW COMPARISON:  01/12/2021 FINDINGS: Cardiomegaly and aortic tortuosity. Hazy bilateral airspace disease attributed to COVID pattern pneumonia. No visible effusion or pneumothorax. There is artifact from BiPAP. IMPRESSION: Similar degree of bilateral pneumonia. Electronically Signed   By: Marnee Spring M.D.   On: 01/03/2021 07:51   DG Chest Port 1 View  Result Date: 01-12-2021 CLINICAL DATA:  COVID, shortness of breath EXAM: PORTABLE CHEST 1 VIEW COMPARISON:  06/19/2020 FINDINGS: Heart is borderline enlarged. Tortuous, enlarged thoracic aorta which appears aneurysmal, similar to prior study. Patchy bilateral airspace opacities. No effusions or acute bony abnormality. IMPRESSION: Patchy bilateral airspace disease concerning for pneumonia. Tortuous, dilated/aneurysmal thoracic aorta, similar to prior study. Electronically Signed   By: Charlett Nose M.D.   On: 01/12/2021 22:10   DG Abd Portable 1V  Result Date: 01/06/2021 CLINICAL DATA:  Re-evaluate NG tube position. EXAM: PORTABLE ABDOMEN - 1 VIEW COMPARISON:  Same day abdominal  radiograph FINDINGS: Weighted enteric feeding catheter with tip in the expected location of the distal esophagus/proximal stomach. Nonobstructive bowel gas pattern. Similar bibasilar patchy airspace consolidations. IMPRESSION: Weighted enteric feeding catheter with tip in the expected location of the distal esophagus/proximal stomach. Electronically Signed   By: Maudry Mayhew MD   On: 01/06/2021 17:58   VAS [place] LOWER EXTREMITY VENOUS (DVT)  Result Date: 12/29/2020  Lower Venous DVT Study Indications: Covid+.  Limitations: Poor ultrasound/tissue interface and Patient position. Comparison Study: No prior exams Performing Technologist: Ernestene Mention  Examination Guidelines: A complete evaluation includes B-mode imaging, spectral Doppler, color Doppler, and power Doppler as needed of all accessible portions of each vessel. Bilateral testing is considered an integral part of a complete examination. Limited examinations for reoccurring indications may be performed as noted. The reflux portion of the exam is performed with the patient in reverse Trendelenburg.  +---------+---------------+---------+-----------+----------+-------------------+ RIGHT    CompressibilityPhasicitySpontaneityPropertiesThrombus Aging      +---------+---------------+---------+-----------+----------+-------------------+ CFV      Full           Yes      Yes                                      +---------+---------------+---------+-----------+----------+-------------------+ SFJ      Full                                                             +---------+---------------+---------+-----------+----------+-------------------+ FV Prox  Full           Yes      Yes                                      +---------+---------------+---------+-----------+----------+-------------------+  FV Mid   Full           Yes      Yes                                       +---------+---------------+---------+-----------+----------+-------------------+ FV DistalFull           Yes      Yes                                      +---------+---------------+---------+-----------+----------+-------------------+ PFV      Full                                                             +---------+---------------+---------+-----------+----------+-------------------+ POP      Full           Yes      Yes                                      +---------+---------------+---------+-----------+----------+-------------------+ PTV      Full                                                             +---------+---------------+---------+-----------+----------+-------------------+ PERO     Full                                         Not well visualized +---------+---------------+---------+-----------+----------+-------------------+   +---------+---------------+---------+-----------+----------+------------------+ LEFT     CompressibilityPhasicitySpontaneityPropertiesThrombus Aging     +---------+---------------+---------+-----------+----------+------------------+ CFV      Full           Yes      Yes                                     +---------+---------------+---------+-----------+----------+------------------+ SFJ      Full                                                            +---------+---------------+---------+-----------+----------+------------------+ FV Prox  Full           Yes      Yes                                     +---------+---------------+---------+-----------+----------+------------------+ FV Mid   Full           Yes      Yes                                     +---------+---------------+---------+-----------+----------+------------------+  FV Distal                                             Not seen on this                                                         exam                +---------+---------------+---------+-----------+----------+------------------+ PFV      Full                                                            +---------+---------------+---------+-----------+----------+------------------+ POP      Full           Yes      Yes                                     +---------+---------------+---------+-----------+----------+------------------+ PTV      Full                                         Not well                                                                 visualized         +---------+---------------+---------+-----------+----------+------------------+ PERO                                                  Not seen on this                                                         exam               +---------+---------------+---------+-----------+----------+------------------+ LLE difficult to image due to patient positioning. Patient unable to speak english or hear interpreter so unable to given exam instructions.  Left Technical Findings: Not visualized segments include Distal femoral & peroneal veins.   Summary: RIGHT: - There is no evidence of deep vein thrombosis in the lower extremity.  - A cystic structure is found in the popliteal fossa.  LEFT: - There is no evidence of deep vein thrombosis in the lower extremity. However, portions of this examination were limited- see technologist comments above.  - No cystic structure found in the popliteal fossa.  *  See table(s) above for measurements and observations. Electronically signed by Heath Lark on 12/29/2020 at 2:46:07 PM.    Final     Microbiology: Recent Results (from the past 240 hour(s))  MRSA PCR Screening     Status: None   Collection Time: 01/07/21  8:33 AM   Specimen: Nasal Mucosa; Nasopharyngeal  Result Value Ref Range Status   MRSA by PCR NEGATIVE NEGATIVE Final    Comment:        The GeneXpert MRSA Assay (FDA approved for NASAL specimens only), is  one component of a comprehensive MRSA colonization surveillance program. It is not intended to diagnose MRSA infection nor to guide or monitor treatment for MRSA infections. Performed at [facility], [address], [place], [place] [address]      Labs: Basic Metabolic Panel: Recent Labs  Lab 01/04/21 1807 01/05/21 0534 01/06/21 0516 01/06/21 1040 01/06/21 1548 01/06/21 2139 01/07/21 0412  NA 149* 146* 154* 152* 153* 152* 149*  K 3.9 4.6 4.0  --   --   --  4.0  CL 110 113* 115*  --   --   --  109  CO2 29 23 29   --   --   --  30  GLUCOSE 163* 163* 159*  --   --   --  203*  BUN 56* 60* 55*  --   --   --  51*  CREATININE 1.14* 1.06* 0.83  --   --   --  0.94  CALCIUM 7.7* 7.4* 7.7*  --   --   --  7.7*  MG  --   --   --   --   --   --  2.8*  PHOS  --   --   --   --   --   --  2.8   Liver Function Tests: Recent Labs  Lab 01/04/21 1807 01/05/21 0534 01/06/21 0516 01/07/21 0412  AST 86* 86* 165* 100*  ALT 49* 50* 88* 94*  ALKPHOS 92 84 86 87  BILITOT 1.7* 2.2* 3.8* 3.0*  PROT 5.8* 5.4* 5.3* 5.3*  ALBUMIN 2.2* 2.0* 2.1* 2.1*   No results for input(s): LIPASE, AMYLASE in the last 168 hours. No results for input(s): AMMONIA in the last 168 hours. CBC: Recent Labs  Lab 01/05/21 0534 01/06/21 0516 01/07/21 0412 01/07/21 1448  WBC 19.5* 23.4* 31.5* 39.5*  NEUTROABS 18.1* 21.8*  --   --   HGB 12.0 11.2* 11.5* 12.3  HCT 38.0 35.5* 36.6 38.5  MCV 83.2 82.8 83.2 82.8  PLT 263 240 194 216   Cardiac Enzymes: No results for input(s): CKTOTAL, CKMB, CKMBINDEX, TROPONINI in the last 168 hours. D-Dimer No results for input(s): DDIMER in the last 72 hours. BNP: Invalid input(s): POCBNP CBG: Recent Labs  Lab 01/06/21 2319 01/07/21 0350 01/07/21 0813 01/07/21 1217 01/07/21 1522  GLUCAP 140* 178* 187* 198* 187*   Anemia work up No results for input(s): VITAMINB12, FOLATE, FERRITIN, TIBC, IRON, RETICCTPCT in the last 72 hours. Urinalysis     Component Value Date/Time   COLORURINE STRAW (A) 03/04/2017 1230   APPEARANCEUR CLEAR 03/04/2017 1230   LABSPEC 1.005 03/04/2017 1230   PHURINE 7.0 03/04/2017 1230   GLUCOSEU NEGATIVE 03/04/2017 1230   HGBUR NEGATIVE 03/04/2017 1230   BILIRUBINUR NEGATIVE 03/04/2017 1230   KETONESUR NEGATIVE 03/04/2017 1230   PROTEINUR NEGATIVE 03/04/2017 1230   NITRITE NEGATIVE 03/04/2017 1230   LEUKOCYTESUR NEGATIVE 03/04/2017 1230   Sepsis Labs Invalid input(s):  PROCALCITONIN,  WBC,  LACTICIDVEN     SIGNED:  Darlin Drop, MD  [facility] 01/11/2021, 8:26 AM Pager   If 7PM-7AM, please contact night-coverage [email or web address] Password [email or web address]

## 2021-01-29 DEATH — deceased

## 2022-01-04 IMAGING — MR MR MRA HEAD W/O CM
1 series · 16 of 48 positions shown · non-contrast
Comparison: Prior CT from earlier same day as well as previous MRI
from 03/04/2017.

CLINICAL DATA: Initial evaluation for acute stroke, slurred speech,
right-sided weakness.

EXAM:
MRI HEAD WITHOUT CONTRAST
MRA HEAD WITHOUT CONTRAST
TECHNIQUE: Multiplanar, multiecho pulse sequences of the brain and surrounding
structures were obtained without intravenous contrast. Angiographic
images of the head were obtained using MRA technique without
contrast.

[Series 2: ax (id) · axial · 1.0mm · 0.43mm/px · z∈[-81,+5]mm · 16 of 184 slices shown]
[im 1/184]
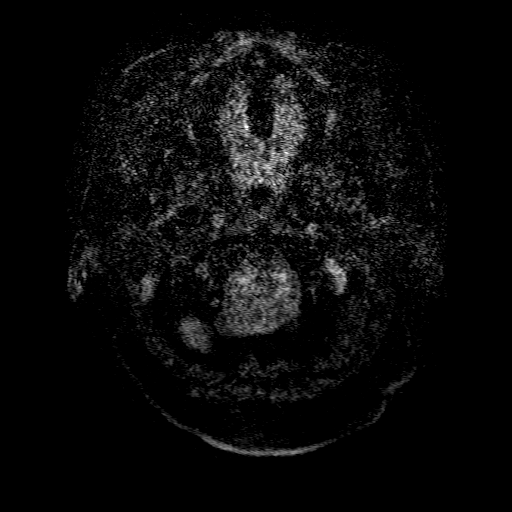
[im 4/184]
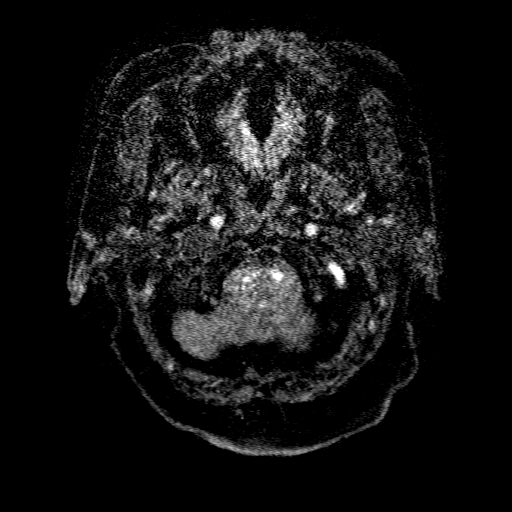
[im 8/184]
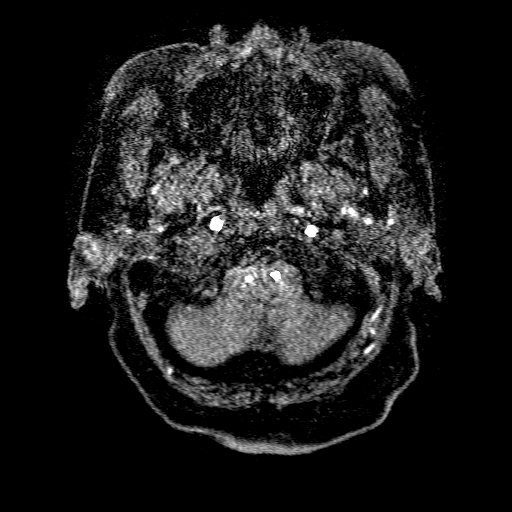
[im 12/184]
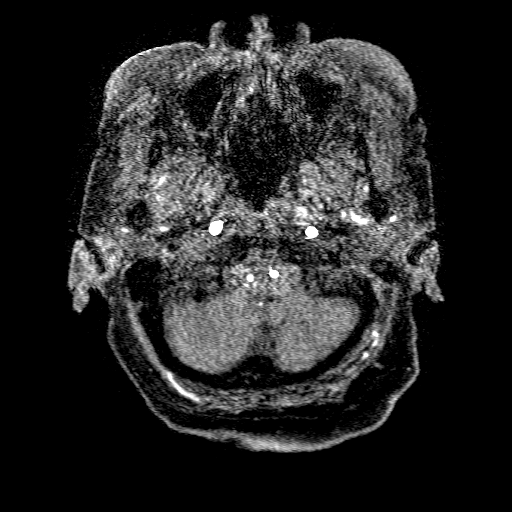
[im 16/184]
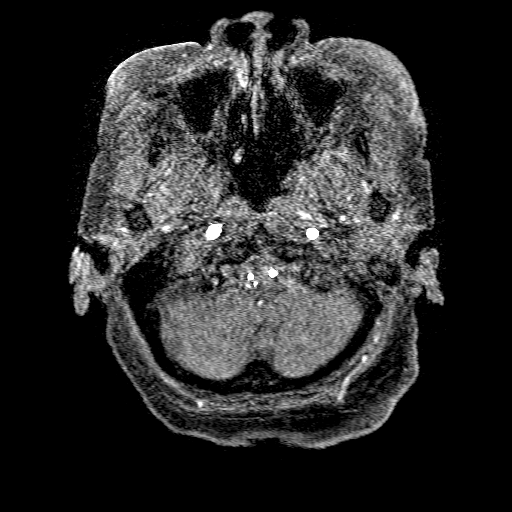
[im 20/184]
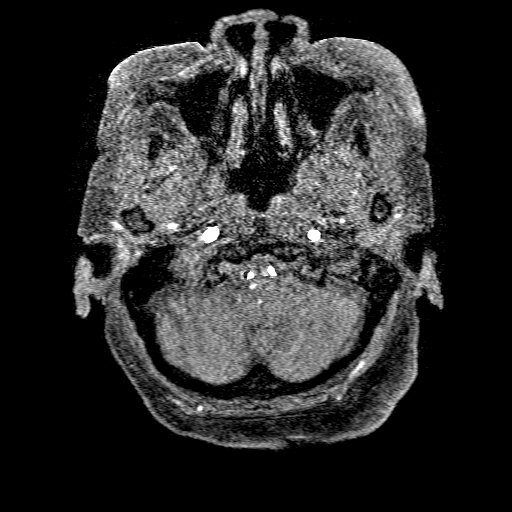
[im 32/184]
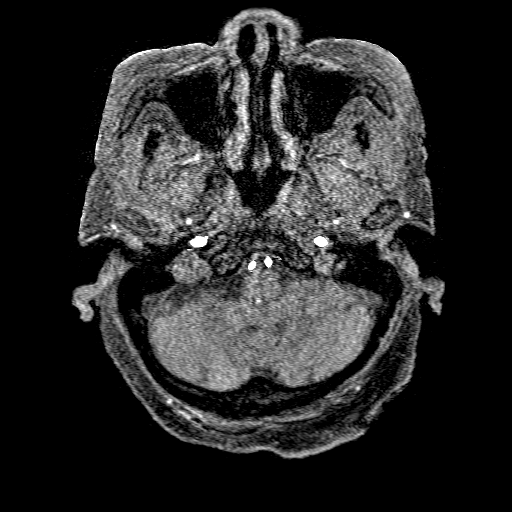
[im 36/184]
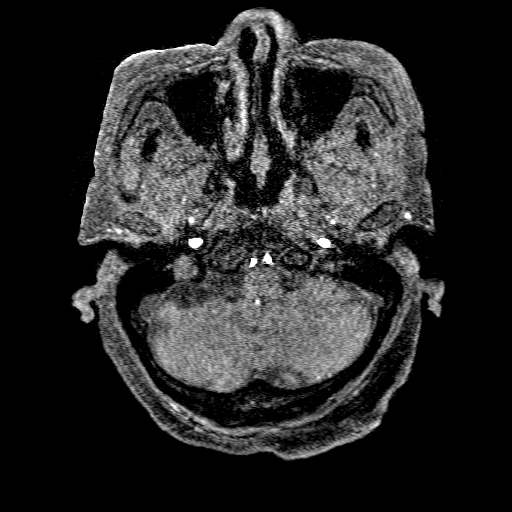
[im 59/184]
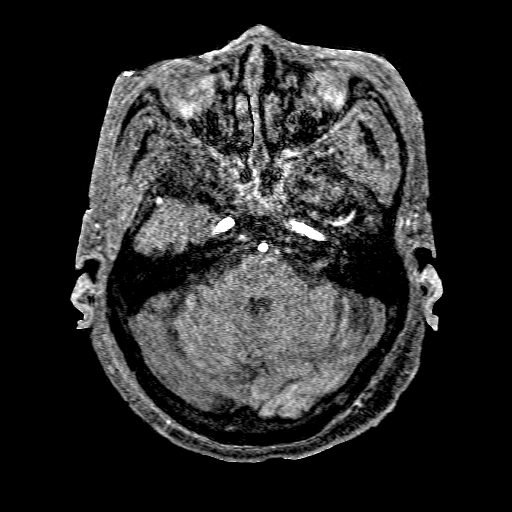
[im 82/184]
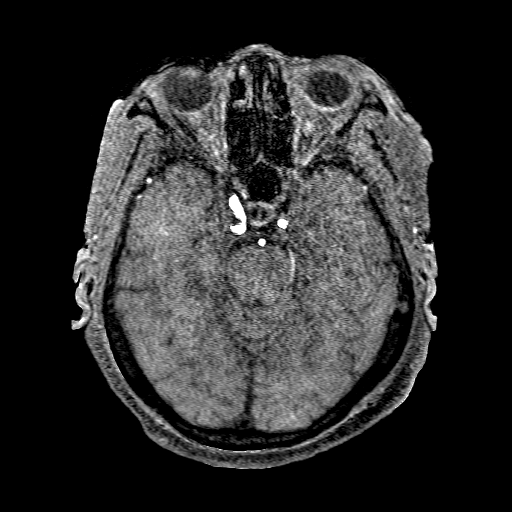
[im 94/184]
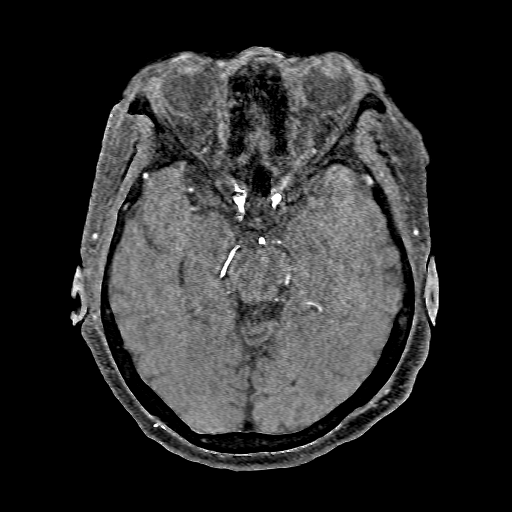
[im 106/184]
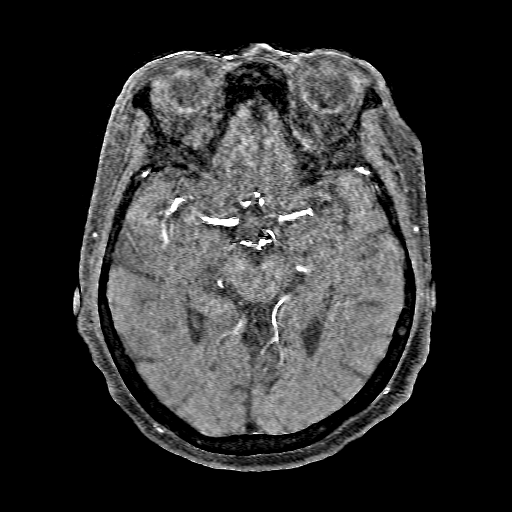
[im 129/184]
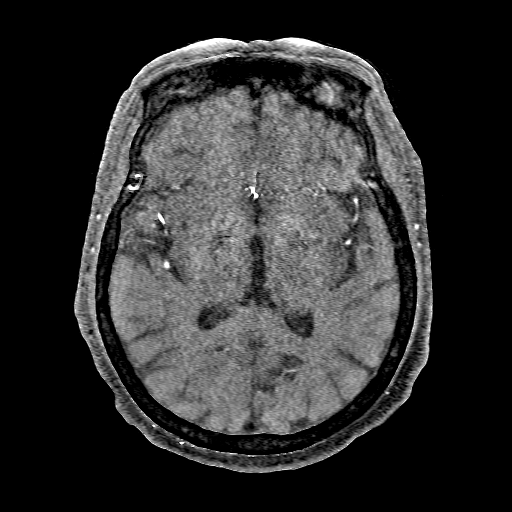
[im 152/184]
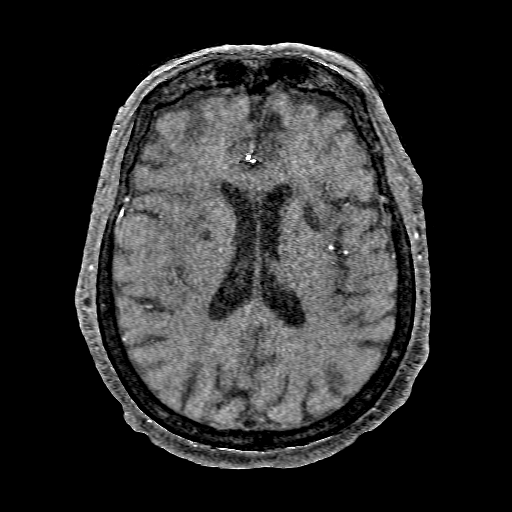
[im 156/184]
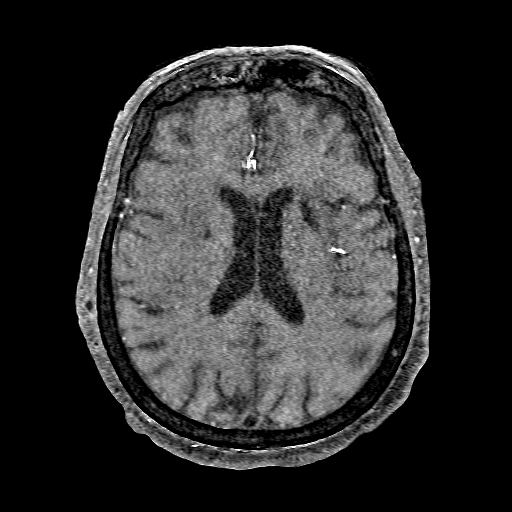
[im 176/184]
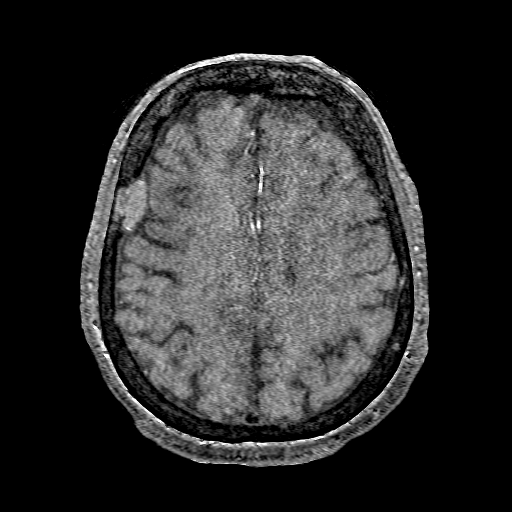

[16 of 48 positions shown; findings below may reference images not displayed]

FINDINGS: MRI HEAD FINDINGS

Brain: Examination significantly degraded by motion artifact.

Diffuse prominence of the CSF containing spaces compatible
generalized age-related cerebral atrophy. Patchy T2/FLAIR
hyperintensity within the periventricular and deep white matter both
cerebral hemispheres most consistent with chronic small vessel
ischemic disease, moderate in nature. Multiple remote lacunar
infarcts present about the bilateral basal ganglia and right
thalamus.

Subtle approximate 6 mm focus of diffusion abnormality seen
involving the left paramedian pons, suspicious for an acute ischemic
infarct (series 2, image 15). No visible associated hemorrhage or
mass effect on this motion degraded exam. No other diffusion
abnormality to suggest acute or subacute ischemia. Gray-white matter
differentiation otherwise maintained. No other areas of remote
cortical infarction. No foci of susceptibility artifact to suggest
acute or chronic intracranial hemorrhage on this motion degraded
exam.

2.8 cm mass involving the right frontal calvarium with extraosseous
extension again seen, indeterminate. No associated mass effect on
the subjacent right frontal lobe. No other mass lesion. No mass
effect or midline shift. No hydrocephalus or extra-axial fluid
collection. Pituitary gland grossly within normal limits. Midline
structures intact.

Vascular: Major intracranial vascular flow voids are maintained.

Skull and upper cervical spine: Craniocervical junction within
normal limits. Upper cervical spine normal. Bone marrow signal
intensity within normal limits. No other focal marrow replacing
lesions. Scalp soft tissues otherwise unremarkable.

Sinuses/Orbits: Globes and orbital soft tissues within normal
limits. Visualized paranasal sinuses are grossly clear. No
significant mastoid effusion.

Other: None.

MRA HEAD FINDINGS

ANTERIOR CIRCULATION:

Examination moderately degraded by motion artifact.

Visualized distal cervical segments of the internal carotid arteries
are patent with antegrade flow. Petrous segments widely patent.
Scattered atheromatous irregularity throughout the
cavernous/supraclinoid ICAs without hemodynamically significant
stenosis. Right A1 segment widely patent. Left A1 hypoplastic and/or
absent, accounting for the diminutive left ICA is compared to the
right. Normal anterior communicating artery complex. Anterior
cerebral arteries patent to their distal aspects without appreciable
stenosis. M1 segments patent bilaterally. Grossly normal MCA
bifurcations. Distal MCA branches well perfused and fairly
symmetric.

POSTERIOR CIRCULATION:

Vertebral arteries patent to the vertebrobasilar junction without
stenosis. Left vertebral artery slightly dominant. Neither PICA
origin visualized. Basilar widely patent proximally. There is a
short-segment moderate stenosis involving the mid basilar artery
(series 2, image 91). Basilar otherwise patent to its distal aspect.
Superior cerebral arteries grossly patent proximally. PCAs primarily
supplied via the basilar, both of which are grossly patent to their
distal aspects without definite stenosis, although evaluation
limited by motion.

No intracranial aneurysm.
IMPRESSION: MRI HEAD IMPRESSION:

1. Motion degraded exam.
2. Subtle approximate 6 mm acute ischemic nonhemorrhagic left
pontine infarct.
3. 2.8 cm mass involving the right frontal calvarium with
extraosseous extension, indeterminate. In retrospect, this lesion
appears to have been present on prior MRI from [id number], although is
much larger on today's exam. Again, primary differential
considerations include a possible intra osseous meningioma versus
osseous metastasis. Neuro surgical consultation and referral for
consideration of possible histologic sampling suggested for further
evaluation. Additionally, postcontrast imaging could be performed
for complete evaluation as clinically desired.
4. Underlying age-related cerebral atrophy with moderate chronic
microvascular ischemic disease, with multiple remote lacunar
infarcts about the bilateral basal ganglia and right thalamus.

MRA HEAD IMPRESSION:

1. Motion degraded exam.
2. Negative intracranial MRA for large vessel occlusion.
3. Short-segment moderate stenosis involving the mid basilar artery.
4. Additional scattered intracranial atherosclerotic irregularity,
with no other hemodynamically significant or correctable stenosis
identified.
